# Patient Record
Sex: Female | Born: 1935 | Race: White | Hispanic: No | Marital: Married | Smoking: Never smoker
Health system: Southern US, Academic
[De-identification: ages and names within clinical notes are randomized; demographics above are authoritative.]

## PROBLEM LIST (undated history)

## (undated) ENCOUNTER — Emergency Department (HOSPITAL_COMMUNITY): Payer: Medicare Other

## (undated) DIAGNOSIS — N189 Chronic kidney disease, unspecified: Secondary | ICD-10-CM

## (undated) DIAGNOSIS — K635 Polyp of colon: Secondary | ICD-10-CM

## (undated) DIAGNOSIS — E119 Type 2 diabetes mellitus without complications: Secondary | ICD-10-CM

## (undated) DIAGNOSIS — M539 Dorsopathy, unspecified: Secondary | ICD-10-CM

## (undated) DIAGNOSIS — M6281 Muscle weakness (generalized): Secondary | ICD-10-CM

## (undated) DIAGNOSIS — Z973 Presence of spectacles and contact lenses: Secondary | ICD-10-CM

## (undated) DIAGNOSIS — H9190 Unspecified hearing loss, unspecified ear: Secondary | ICD-10-CM

## (undated) DIAGNOSIS — Z8669 Personal history of other diseases of the nervous system and sense organs: Secondary | ICD-10-CM

## (undated) DIAGNOSIS — E7800 Pure hypercholesterolemia, unspecified: Secondary | ICD-10-CM

## (undated) DIAGNOSIS — I10 Essential (primary) hypertension: Secondary | ICD-10-CM

## (undated) DIAGNOSIS — M1990 Unspecified osteoarthritis, unspecified site: Secondary | ICD-10-CM

## (undated) DIAGNOSIS — J45909 Unspecified asthma, uncomplicated: Secondary | ICD-10-CM

## (undated) DIAGNOSIS — Z950 Presence of cardiac pacemaker: Secondary | ICD-10-CM

## (undated) DIAGNOSIS — Z955 Presence of coronary angioplasty implant and graft: Secondary | ICD-10-CM

## (undated) DIAGNOSIS — K219 Gastro-esophageal reflux disease without esophagitis: Secondary | ICD-10-CM

## (undated) DIAGNOSIS — K922 Gastrointestinal hemorrhage, unspecified: Secondary | ICD-10-CM

## (undated) DIAGNOSIS — G629 Polyneuropathy, unspecified: Secondary | ICD-10-CM

## (undated) DIAGNOSIS — K5732 Diverticulitis of large intestine without perforation or abscess without bleeding: Secondary | ICD-10-CM

## (undated) HISTORY — PX: KNEE ARTHROPLASTY: SHX992

## (undated) HISTORY — PX: HX GALL BLADDER SURGERY/CHOLE: SHX55

## (undated) HISTORY — PX: ESOPHAGOGASTRODUODENOSCOPY: SHX1529

## (undated) HISTORY — PX: ORIF DISTAL FEMUR FRACTURE: SUR926

## (undated) HISTORY — PX: HX HIP REPLACEMENT: SHX124

## (undated) HISTORY — PX: ABDOMINAL SURGERY: SHX537

## (undated) HISTORY — PX: CATARACT EXTRACTION: SUR2

## (undated) HISTORY — PX: TOTAL ABDOMINAL HYSTERECTOMY: SHX209

## (undated) HISTORY — DX: Type 2 diabetes mellitus without complications (CMS HCC): E11.9

## (undated) HISTORY — PX: OTHER SURGICAL HISTORY: SHX170

## (undated) HISTORY — DX: Gastro-esophageal reflux disease without esophagitis: K21.9

## (undated) HISTORY — PX: HX PACEMAKER INSERTION: 2100001161

## (undated) HISTORY — DX: Chronic kidney disease, unspecified: N18.9

## (undated) HISTORY — DX: Pure hypercholesterolemia, unspecified: E78.00

## (undated) HISTORY — PX: ANKLE SURGERY: SHX546

## (undated) HISTORY — DX: Gastrointestinal hemorrhage, unspecified: K92.2

## (undated) HISTORY — PX: HX BACK SURGERY: SHX140

## (undated) HISTORY — PX: HIP SURGERY: SHX245

## (undated) HISTORY — PX: HX COLONOSCOPY: 2100001147

## (undated) HISTORY — PX: CORONARY ARTERY ANGIOPLASTY: PR CATH30428

## (undated) HISTORY — PX: HX SHOULDER SURGERY: 2100001311

---

## 1960-08-13 HISTORY — PX: HX OPEN CHOLECYSTECTOMY: SHX57

## 1969-08-13 HISTORY — PX: PANCREATIC PSEUDOCYST DRAINAGE: SHX2158

## 1993-08-13 DIAGNOSIS — I639 Cerebral infarction, unspecified: Secondary | ICD-10-CM

## 1993-08-13 HISTORY — DX: Cerebral infarction, unspecified (CMS HCC): I63.9

## 2000-08-13 HISTORY — PX: HX NISSEN FUNDOPLICATION: 2100001141

## 2003-03-11 ENCOUNTER — Ambulatory Visit (HOSPITAL_COMMUNITY): Payer: Self-pay | Admitting: GENERAL SURGERY

## 2008-08-13 DIAGNOSIS — I2510 Atherosclerotic heart disease of native coronary artery without angina pectoris: Secondary | ICD-10-CM

## 2008-08-13 HISTORY — DX: Atherosclerotic heart disease of native coronary artery without angina pectoris: I25.10

## 2009-08-13 HISTORY — PX: CORONARY ARTERY ANGIOPLASTY: PR CATH30428

## 2012-03-13 IMAGING — CR DG KNEE COMPLETE 4+V*R*
4 series · 4 of 4 positions shown · non-contrast
Comparison: None.

CLINICAL DATA: Knee pain and swelling.

RIGHT KNEE - COMPLETE 4+ VIEW

[t knee ap right]
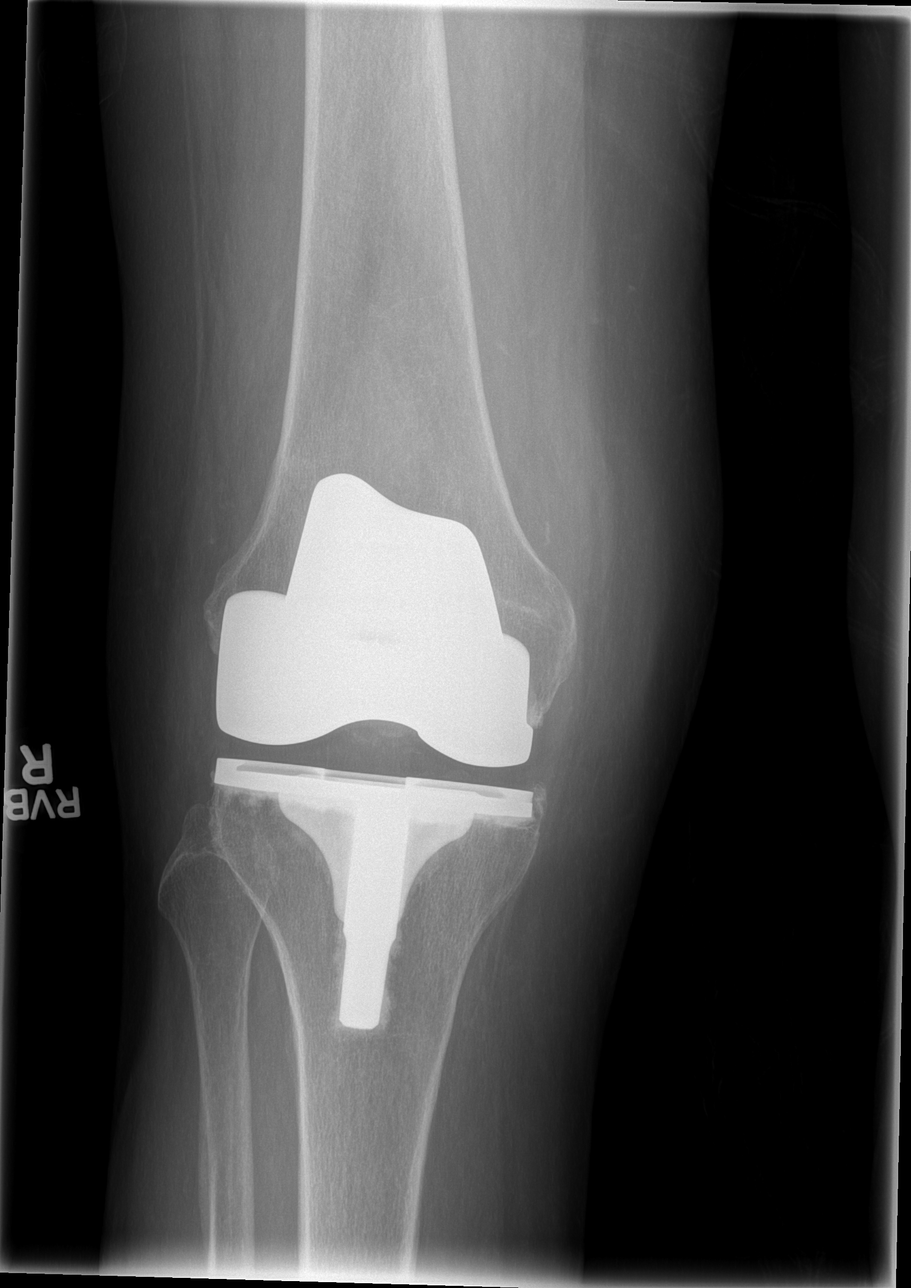

[t knee oblique right (1 of 2)]
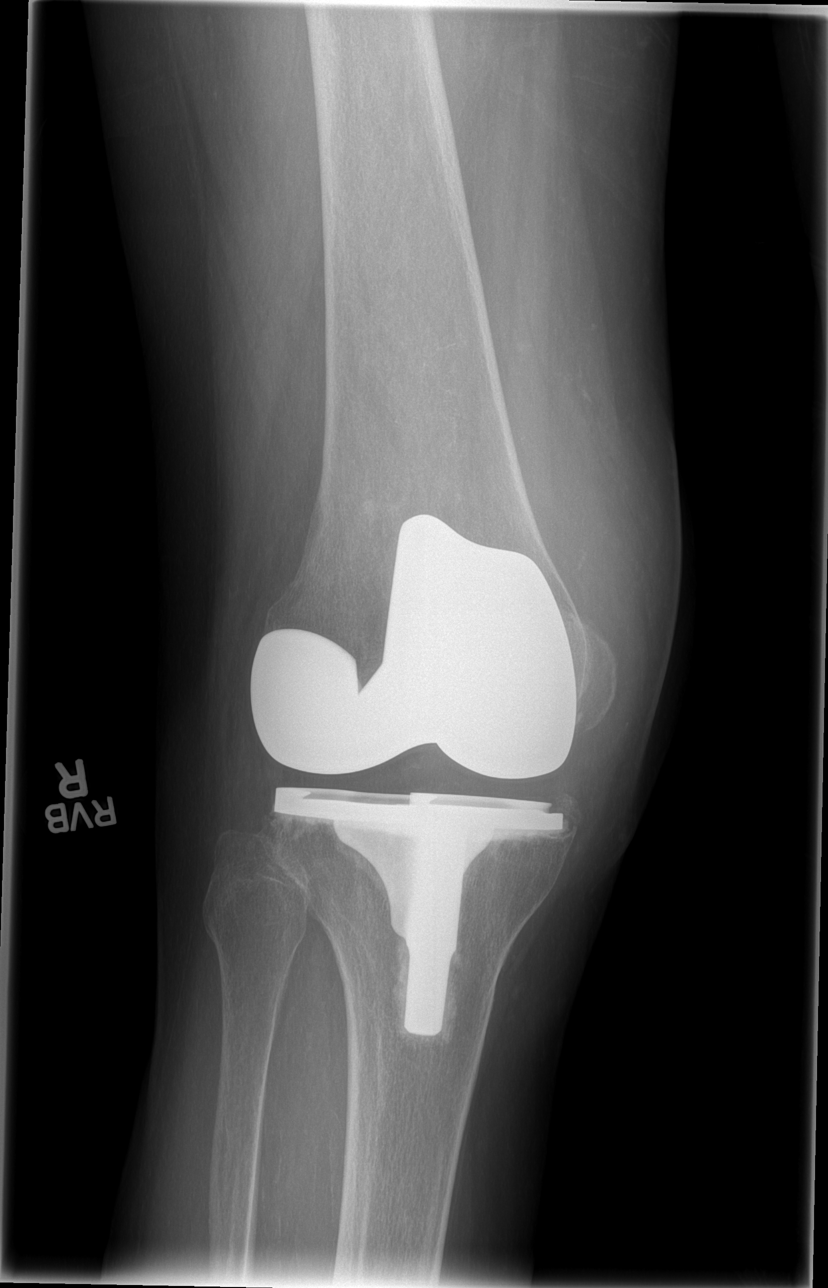

[t knee oblique right (2 of 2)]
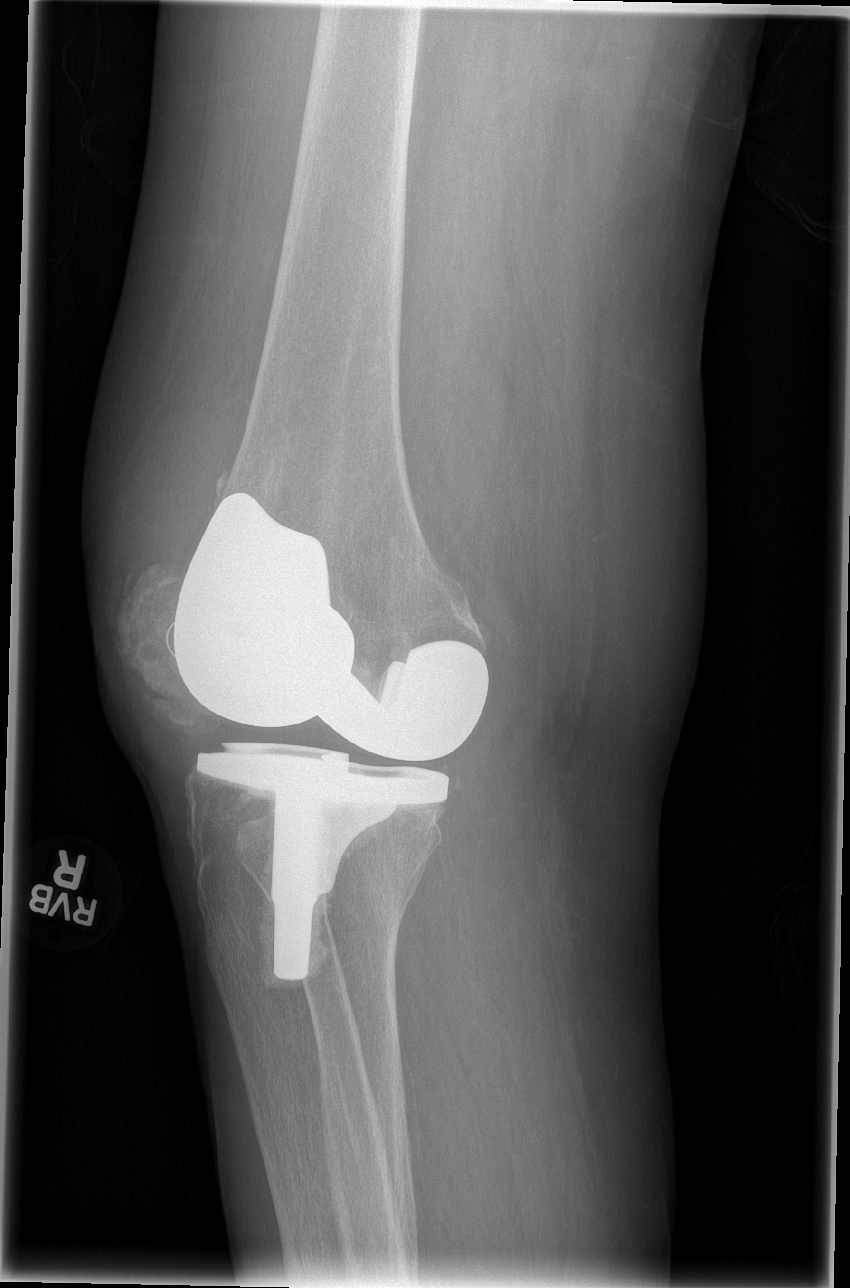

[t knee lat right]
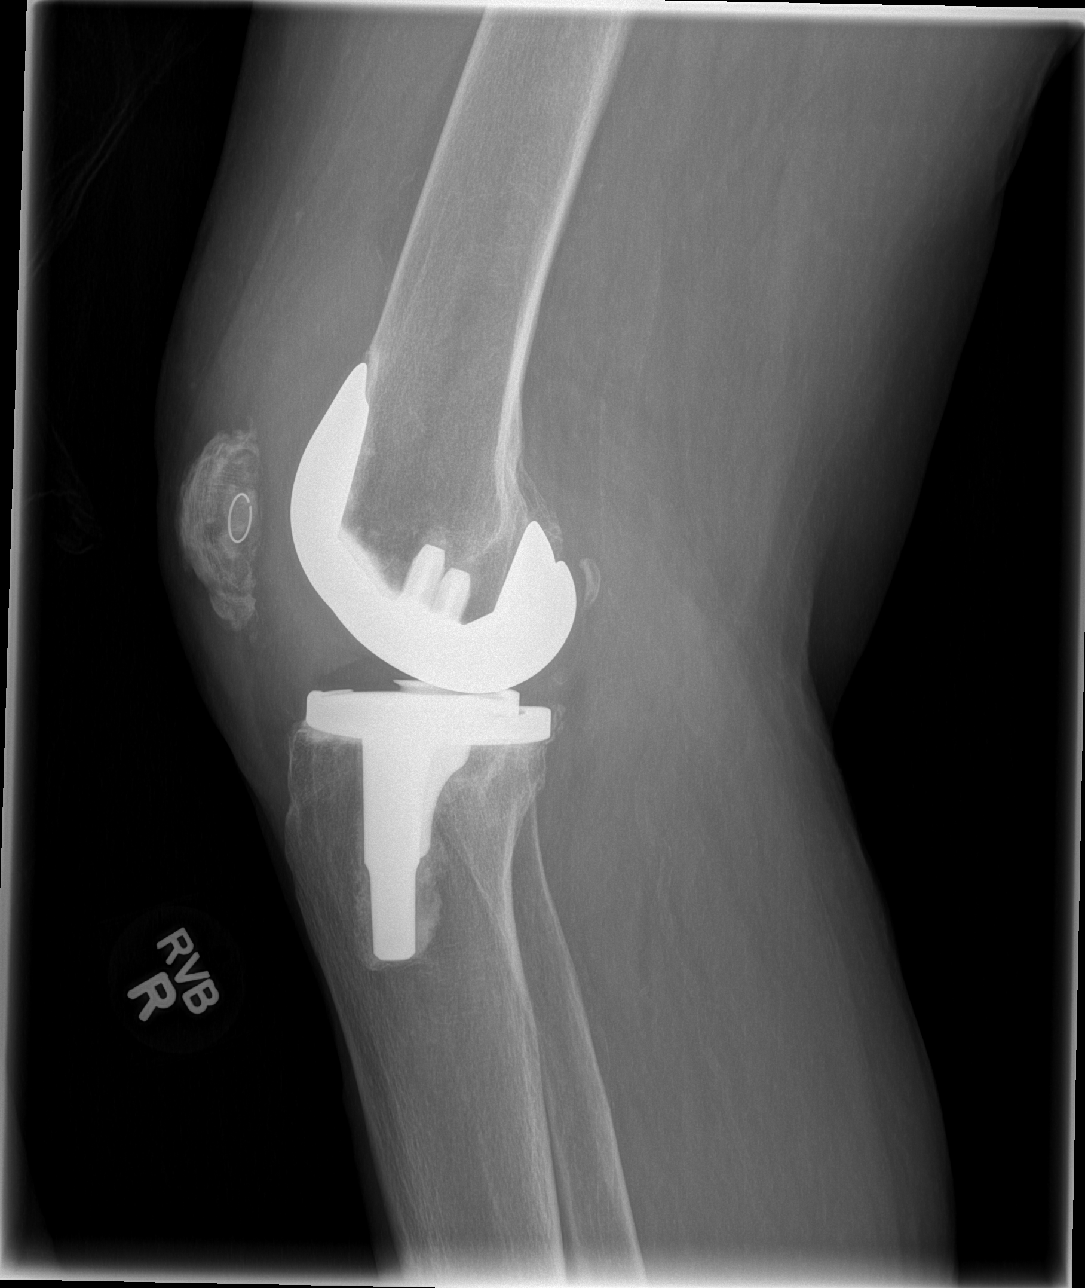

[4 of 4 positions shown; findings below may reference images not displayed]

FINDINGS: Total knee arthroplasty is seen with all three components
in expected position.  Large knee joint effusion is seen.  No
evidence of fracture or dislocation.  No evidence of prosthetic
loosening.  Generalized osteopenia noted.
IMPRESSION: 1.  Large knee joint effusion.
2.  No acute osseous abnormality.
3.  Osteopenia.

## 2014-05-19 DIAGNOSIS — D649 Anemia, unspecified: Secondary | ICD-10-CM | POA: Insufficient documentation

## 2014-08-06 DIAGNOSIS — I4891 Unspecified atrial fibrillation: Secondary | ICD-10-CM | POA: Insufficient documentation

## 2014-08-06 DIAGNOSIS — Z8673 Personal history of transient ischemic attack (TIA), and cerebral infarction without residual deficits: Secondary | ICD-10-CM

## 2014-08-06 DIAGNOSIS — M14672 Charcot's joint, left ankle and foot: Secondary | ICD-10-CM

## 2014-08-06 DIAGNOSIS — E119 Type 2 diabetes mellitus without complications: Secondary | ICD-10-CM | POA: Insufficient documentation

## 2014-08-06 DIAGNOSIS — M1990 Unspecified osteoarthritis, unspecified site: Secondary | ICD-10-CM | POA: Insufficient documentation

## 2014-08-06 DIAGNOSIS — I480 Paroxysmal atrial fibrillation: Secondary | ICD-10-CM | POA: Insufficient documentation

## 2014-08-06 DIAGNOSIS — Z9861 Coronary angioplasty status: Secondary | ICD-10-CM

## 2014-08-06 DIAGNOSIS — I2510 Atherosclerotic heart disease of native coronary artery without angina pectoris: Secondary | ICD-10-CM

## 2014-08-06 DIAGNOSIS — Z950 Presence of cardiac pacemaker: Secondary | ICD-10-CM

## 2014-08-06 DIAGNOSIS — K219 Gastro-esophageal reflux disease without esophagitis: Secondary | ICD-10-CM

## 2014-08-06 DIAGNOSIS — M069 Rheumatoid arthritis, unspecified: Secondary | ICD-10-CM

## 2014-08-06 DIAGNOSIS — K279 Peptic ulcer, site unspecified, unspecified as acute or chronic, without hemorrhage or perforation: Secondary | ICD-10-CM

## 2014-08-06 HISTORY — DX: Personal history of transient ischemic attack (TIA), and cerebral infarction without residual deficits: Z86.73

## 2014-08-06 HISTORY — DX: Type 2 diabetes mellitus without complications (CMS HCC): E11.9

## 2014-08-06 HISTORY — DX: Peptic ulcer, site unspecified, unspecified as acute or chronic, without hemorrhage or perforation: K27.9

## 2014-08-06 HISTORY — DX: Gastro-esophageal reflux disease without esophagitis: K21.9

## 2014-08-06 HISTORY — DX: Paroxysmal atrial fibrillation (CMS HCC): I48.0

## 2014-08-06 HISTORY — DX: Charcot's joint, left ankle and foot: M14.672

## 2014-08-06 HISTORY — DX: Coronary angioplasty status: Z98.61

## 2014-08-06 HISTORY — DX: Presence of cardiac pacemaker: Z95.0

## 2014-08-06 HISTORY — DX: Atherosclerotic heart disease of native coronary artery without angina pectoris: I25.10

## 2014-08-06 HISTORY — DX: Rheumatoid arthritis, unspecified (CMS HCC): M06.9

## 2015-01-13 DIAGNOSIS — I10 Essential (primary) hypertension: Secondary | ICD-10-CM | POA: Insufficient documentation

## 2017-01-10 DIAGNOSIS — Z950 Presence of cardiac pacemaker: Secondary | ICD-10-CM | POA: Insufficient documentation

## 2017-08-13 HISTORY — PX: HX PACEMAKER INSERTION: 2100001161

## 2018-09-04 ENCOUNTER — Emergency Department (HOSPITAL_COMMUNITY): Payer: Self-pay | Admitting: Emergency Medicine

## 2019-02-23 ENCOUNTER — Other Ambulatory Visit: Payer: Medicare Other | Attending: Internal Medicine | Admitting: Internal Medicine

## 2019-02-23 ENCOUNTER — Other Ambulatory Visit: Payer: Self-pay

## 2019-02-23 ENCOUNTER — Other Ambulatory Visit (HOSPITAL_COMMUNITY): Payer: Medicare Other

## 2019-02-23 DIAGNOSIS — E785 Hyperlipidemia, unspecified: Secondary | ICD-10-CM

## 2019-02-23 DIAGNOSIS — E119 Type 2 diabetes mellitus without complications: Secondary | ICD-10-CM

## 2019-02-23 LAB — HEPATIC FUNCTION PANEL
ALBUMIN/GLOBULIN RATIO: 1.6 — ABNORMAL LOW (ref 2.5–4.0)
ALBUMIN: 4.7 g/dL (ref 3.5–5.2)
ALKALINE PHOSPHATASE: 140 U/L — ABNORMAL HIGH (ref 35–104)
ALT (SGPT): 11 U/L (ref 0–33)
AST (SGOT): 25 U/L (ref 0–32)
BILIRUBIN DIRECT: 0 mg/dL (ref 0.0–0.2)
BILIRUBIN TOTAL: 0.3 mg/dL (ref 0.0–1.0)
PROTEIN TOTAL: 7.6 g/dL (ref 6.6–8.7)

## 2019-02-23 LAB — LIPID PANEL
CHOLESTEROL: 204 mg/dL — ABNORMAL HIGH (ref 100–170)
HDL CHOL: 84 mg/dL — ABNORMAL HIGH (ref 30–65)
LDL CALC: 86 mg/dL (ref 0–130)
TRIGLYCERIDES: 168 mg/dL (ref 0–200)
VLDL CALC: 34 mg/dL (ref 5–40)

## 2019-02-23 LAB — HGA1C (HEMOGLOBIN A1C WITH EST AVG GLUCOSE)
ESTIMATED AVERAGE GLUCOSE: 120 mg/dL
HEMOGLOBIN A1C: 5.8 % (ref 4.8–5.9)

## 2019-05-03 ENCOUNTER — Other Ambulatory Visit: Payer: Self-pay

## 2019-05-03 ENCOUNTER — Emergency Department: Payer: Medicare Other | Attending: NURSE PRACTITIONER

## 2019-05-03 DIAGNOSIS — R809 Proteinuria, unspecified: Secondary | ICD-10-CM | POA: Insufficient documentation

## 2019-05-03 DIAGNOSIS — E785 Hyperlipidemia, unspecified: Secondary | ICD-10-CM | POA: Insufficient documentation

## 2019-05-03 DIAGNOSIS — N181 Chronic kidney disease, stage 1: Secondary | ICD-10-CM

## 2019-05-03 DIAGNOSIS — R1310 Dysphagia, unspecified: Secondary | ICD-10-CM | POA: Insufficient documentation

## 2019-05-03 DIAGNOSIS — N183 Chronic kidney disease, stage 3 unspecified (CMS HCC): Secondary | ICD-10-CM

## 2019-05-03 DIAGNOSIS — I2510 Atherosclerotic heart disease of native coronary artery without angina pectoris: Secondary | ICD-10-CM

## 2019-05-03 DIAGNOSIS — I1310 Hypertensive heart and chronic kidney disease without heart failure, with stage 1 through stage 4 chronic kidney disease, or unspecified chronic kidney disease: Secondary | ICD-10-CM | POA: Insufficient documentation

## 2019-05-03 DIAGNOSIS — D649 Anemia, unspecified: Secondary | ICD-10-CM | POA: Insufficient documentation

## 2019-05-03 DIAGNOSIS — E559 Vitamin D deficiency, unspecified: Secondary | ICD-10-CM | POA: Insufficient documentation

## 2019-05-03 LAB — RENAL FUNCTION PANEL
ALBUMIN: 4.5 g/dL (ref 3.5–5.2)
ANION GAP: 17 mmol/L (ref 10–21)
BUN/CREA RATIO: 24
BUN: 31 mg/dL — ABNORMAL HIGH (ref 6–20)
CALCIUM: 10.3 mg/dL (ref 8.4–10.5)
CHLORIDE: 102 mmol/L (ref 96–107)
CO2 TOTAL: 19 mmol/L — ABNORMAL LOW (ref 22–29)
CREATININE: 1.29 mg/dL — ABNORMAL HIGH (ref 0.50–0.90)
ESTIMATED GFR: 39 mL/min/{1.73_m2} — ABNORMAL LOW (ref >60)
GLUCOSE: 114 mg/dL — ABNORMAL HIGH (ref 74–106)
PHOSPHORUS: 4.2 mg/dL (ref 2.5–4.5)
POTASSIUM: 3.8 mmol/L (ref 3.5–5.1)
SODIUM: 138 mmol/L (ref 136–145)

## 2019-05-03 LAB — CBC
HCT: 41.8 % (ref 34.8–46.0)
HGB: 13.9 g/dL (ref 11.5–16.0)
MCH: 30.5 pg (ref 26.0–32.0)
MCHC: 33.3 g/dL (ref 31.0–35.5)
MCV: 91.7 fL (ref 78.0–100.0)
MPV: 10.3 fL (ref 8.7–12.5)
PLATELETS: 469 10*3/uL — ABNORMAL HIGH (ref 150–400)
RBC: 4.56 10*6/uL (ref 3.85–5.22)
RDW-CV: 14.7 % (ref 11.5–15.5)
WBC: 6.6 10*3/uL (ref 3.7–11.0)

## 2019-08-24 ENCOUNTER — Other Ambulatory Visit (HOSPITAL_COMMUNITY): Payer: Self-pay | Admitting: NURSE PRACTITIONER

## 2019-08-24 ENCOUNTER — Ambulatory Visit (HOSPITAL_COMMUNITY)
Admission: RE | Admit: 2019-08-24 | Discharge: 2019-08-24 | Disposition: A | Discharge status: Home / Self Care | Payer: Medicare Other | Source: Ambulatory Visit | Attending: NURSE PRACTITIONER | Admitting: NURSE PRACTITIONER

## 2019-08-24 ENCOUNTER — Ambulatory Visit
Admission: RE | Admit: 2019-08-24 | Discharge: 2019-08-24 | Disposition: A | Discharge status: Home / Self Care | Payer: Medicare Other | Source: Ambulatory Visit | Attending: NURSE PRACTITIONER | Admitting: NURSE PRACTITIONER

## 2019-08-24 ENCOUNTER — Other Ambulatory Visit: Payer: Self-pay

## 2019-08-24 ENCOUNTER — Ambulatory Visit (HOSPITAL_COMMUNITY): Payer: Medicare Other

## 2019-08-24 DIAGNOSIS — I480 Paroxysmal atrial fibrillation: Secondary | ICD-10-CM | POA: Insufficient documentation

## 2019-08-24 DIAGNOSIS — R0600 Dyspnea, unspecified: Secondary | ICD-10-CM

## 2019-08-24 LAB — COMPREHENSIVE METABOLIC PANEL, NON-FASTING
ALBUMIN/GLOBULIN RATIO: 1.7 — ABNORMAL LOW (ref 2.5–4.0)
ALBUMIN: 4.6 g/dL (ref 3.5–5.2)
ALKALINE PHOSPHATASE: 136 U/L — ABNORMAL HIGH (ref 35–104)
ALT (SGPT): 12 U/L (ref 0–33)
ANION GAP: 11 mmol/L (ref 10–21)
AST (SGOT): 29 U/L (ref 0–32)
BILIRUBIN TOTAL: 0.2 mg/dL (ref 0.0–1.0)
BUN/CREA RATIO: 17
BUN: 23 mg/dL — ABNORMAL HIGH (ref 6–20)
CALCIUM: 9.2 mg/dL (ref 8.4–10.5)
CHLORIDE: 104 mmol/L (ref 96–107)
CO2 TOTAL: 21 mmol/L — ABNORMAL LOW (ref 22–29)
CREATININE: 1.39 mg/dL — ABNORMAL HIGH (ref 0.50–0.90)
ESTIMATED GFR: 35 mL/min/{1.73_m2} — ABNORMAL LOW (ref >60)
GLUCOSE: 216 mg/dL — ABNORMAL HIGH (ref 74–106)
POTASSIUM: 4.1 mmol/L (ref 3.5–5.1)
PROTEIN TOTAL: 7.3 g/dL (ref 6.6–8.7)
SODIUM: 136 mmol/L (ref 136–145)

## 2019-08-24 LAB — THYROXINE, FREE (FREE T4): THYROXINE (T4), FREE: 1.17 ng/dL (ref 0.93–1.70)

## 2019-08-24 LAB — THYROID STIMULATING HORMONE (SENSITIVE TSH): TSH: 2.62 u[IU]/mL (ref 0.270–4.200)

## 2019-08-24 LAB — MAGNESIUM: MAGNESIUM: 2.2 mg/dL (ref 1.6–2.6)

## 2019-08-24 MED ORDER — ALBUTEROL SULFATE 2.5 MG/3 ML (0.083 %) SOLUTION FOR NEBULIZATION
2.5000 mg | INHALATION_SOLUTION | Freq: Once | RESPIRATORY_TRACT | Status: AC
Start: 2019-08-24 — End: 2019-08-24
  Administered 2019-08-24: 2.5 mg via RESPIRATORY_TRACT
  Filled 2019-08-24: qty 3

## 2019-08-25 LAB — ECG 12 LEAD
Atrial Rate: 71 {beats}/min
Calculated P Axis: 96 degrees
Calculated R Axis: 85 degrees
Calculated T Axis: 43 degrees
PR Interval: 266 ms
QRS Duration: 104 ms
QT Interval: 430 ms
QTC Calculation: 467 ms
Ventricular rate: 71 {beats}/min

## 2019-11-23 ENCOUNTER — Ambulatory Visit: Payer: Medicare Other | Attending: Internal Medicine | Admitting: Internal Medicine

## 2019-11-23 ENCOUNTER — Other Ambulatory Visit (HOSPITAL_COMMUNITY): Payer: Medicare Other

## 2019-11-23 ENCOUNTER — Other Ambulatory Visit: Payer: Self-pay

## 2019-11-23 DIAGNOSIS — Z79899 Other long term (current) drug therapy: Secondary | ICD-10-CM | POA: Insufficient documentation

## 2019-11-23 DIAGNOSIS — I10 Essential (primary) hypertension: Secondary | ICD-10-CM | POA: Insufficient documentation

## 2019-11-23 DIAGNOSIS — D649 Anemia, unspecified: Secondary | ICD-10-CM | POA: Insufficient documentation

## 2019-11-23 DIAGNOSIS — R531 Weakness: Secondary | ICD-10-CM | POA: Insufficient documentation

## 2019-11-23 LAB — COMPREHENSIVE METABOLIC PANEL, NON-FASTING
ALBUMIN/GLOBULIN RATIO: 1.8 — ABNORMAL LOW (ref 2.5–4.0)
ALBUMIN: 4.6 g/dL (ref 3.5–5.2)
ALKALINE PHOSPHATASE: 109 U/L — ABNORMAL HIGH (ref 35–104)
ALT (SGPT): 21 U/L (ref 0–33)
ANION GAP: 15 mmol/L (ref 10–21)
AST (SGOT): 35 U/L — ABNORMAL HIGH (ref 0–32)
BILIRUBIN TOTAL: 0.4 mg/dL (ref 0.0–1.0)
BUN/CREA RATIO: 18
BUN: 24 mg/dL — ABNORMAL HIGH (ref 6–20)
CALCIUM: 10.1 mg/dL (ref 8.4–10.5)
CHLORIDE: 106 mmol/L (ref 96–107)
CO2 TOTAL: 19 mmol/L — ABNORMAL LOW (ref 22–29)
CREATININE: 1.3 mg/dL — ABNORMAL HIGH (ref 0.50–0.90)
ESTIMATED GFR: 38 mL/min/{1.73_m2} — ABNORMAL LOW (ref >60)
GLUCOSE: 87 mg/dL (ref 74–106)
POTASSIUM: 4 mmol/L (ref 3.5–5.1)
PROTEIN TOTAL: 7.2 g/dL (ref 6.6–8.7)
SODIUM: 140 mmol/L (ref 136–145)

## 2019-11-23 LAB — THYROXINE, FREE (FREE T4): THYROXINE (T4), FREE: 1.35 ng/dL (ref 0.93–1.70)

## 2019-11-23 LAB — CBC WITH DIFF
BASOPHIL #: <0.1 10*3/uL (ref <=0.20)
BASOPHIL %: 0 %
EOSINOPHIL #: <0.1 10*3/uL (ref <=0.50)
EOSINOPHIL %: 1 %
HCT: 42.3 % (ref 34.8–46.0)
HGB: 13.7 g/dL (ref 11.5–16.0)
IMMATURE GRANULOCYTE #: <0.1 10*3/uL (ref <0.10)
IMMATURE GRANULOCYTE %: 1 % (ref 0–1)
LYMPHOCYTE #: 1.61 10*3/uL (ref 1.00–4.80)
LYMPHOCYTE %: 19 %
MCH: 31.2 pg (ref 26.0–32.0)
MCHC: 32.4 g/dL (ref 31.0–35.5)
MCV: 96.4 fL (ref 78.0–100.0)
MONOCYTE #: 0.96 10*3/uL (ref 0.20–1.10)
MONOCYTE %: 11 %
MPV: 11.3 fL (ref 8.7–12.5)
NEUTROPHIL #: 5.99 10*3/uL (ref 1.50–7.70)
NEUTROPHIL %: 68 %
PLATELETS: 338 10*3/uL (ref 150–400)
RBC: 4.39 10*6/uL (ref 3.85–5.22)
RDW-CV: 15.2 % (ref 11.5–15.5)
WBC: 8.7 10*3/uL (ref 3.7–11.0)

## 2019-11-23 LAB — HGA1C (HEMOGLOBIN A1C WITH EST AVG GLUCOSE)
ESTIMATED AVERAGE GLUCOSE: 108 mg/dL
HEMOGLOBIN A1C: 5.4 % (ref 4.8–5.9)

## 2019-11-23 LAB — THYROID STIMULATING HORMONE (SENSITIVE TSH): TSH: 2.34 u[IU]/mL (ref 0.270–4.200)

## 2019-11-23 LAB — FERRITIN: FERRITIN: 118 ng/mL (ref 13–150)

## 2019-12-04 ENCOUNTER — Other Ambulatory Visit: Payer: Self-pay

## 2019-12-04 ENCOUNTER — Ambulatory Visit: Payer: Medicare Other | Attending: NURSE PRACTITIONER

## 2019-12-04 DIAGNOSIS — R197 Diarrhea, unspecified: Secondary | ICD-10-CM | POA: Insufficient documentation

## 2019-12-04 LAB — CLOSTRIDIUM DIFFICILE TOXIN A/B
CLOSTRIDIUM DIFFICILE TOXIN A/B: NEGATIVE
LOT NUMBER: 1120207

## 2019-12-06 ENCOUNTER — Other Ambulatory Visit: Payer: Self-pay

## 2019-12-06 ENCOUNTER — Ambulatory Visit: Payer: Medicare Other | Attending: NURSE PRACTITIONER

## 2019-12-06 DIAGNOSIS — I1310 Hypertensive heart and chronic kidney disease without heart failure, with stage 1 through stage 4 chronic kidney disease, or unspecified chronic kidney disease: Secondary | ICD-10-CM | POA: Insufficient documentation

## 2019-12-06 DIAGNOSIS — E785 Hyperlipidemia, unspecified: Secondary | ICD-10-CM | POA: Insufficient documentation

## 2019-12-06 DIAGNOSIS — R809 Proteinuria, unspecified: Secondary | ICD-10-CM

## 2019-12-06 DIAGNOSIS — I2510 Atherosclerotic heart disease of native coronary artery without angina pectoris: Secondary | ICD-10-CM | POA: Insufficient documentation

## 2019-12-06 DIAGNOSIS — D649 Anemia, unspecified: Secondary | ICD-10-CM | POA: Insufficient documentation

## 2019-12-06 DIAGNOSIS — N183 Chronic kidney disease, stage 3 unspecified: Secondary | ICD-10-CM | POA: Insufficient documentation

## 2019-12-06 DIAGNOSIS — E559 Vitamin D deficiency, unspecified: Secondary | ICD-10-CM | POA: Insufficient documentation

## 2019-12-06 DIAGNOSIS — R1310 Dysphagia, unspecified: Secondary | ICD-10-CM | POA: Insufficient documentation

## 2019-12-06 LAB — VITAMIN D 25 TOTAL: VITAMIN D 25, TOTAL: 40.86 ng/mL (ref 30.00–100.00)

## 2019-12-06 LAB — CBC
HCT: 38.6 % (ref 34.8–46.0)
HGB: 12.4 g/dL (ref 11.5–16.0)
MCH: 31.6 pg (ref 26.0–32.0)
MCHC: 32.1 g/dL (ref 31.0–35.5)
MCV: 98.5 fL (ref 78.0–100.0)
MPV: 9.9 fL (ref 8.7–12.5)
PLATELETS: 452 10*3/uL — ABNORMAL HIGH (ref 150–400)
RBC: 3.92 10*6/uL (ref 3.85–5.22)
RDW-CV: 15 % (ref 11.5–15.5)
WBC: 6.9 10*3/uL (ref 3.7–11.0)

## 2019-12-06 LAB — RENAL FUNCTION PANEL
ALBUMIN: 4.2 g/dL (ref 3.5–5.2)
ANION GAP: 17 mmol/L (ref 10–21)
BUN/CREA RATIO: 22
BUN: 31 mg/dL — ABNORMAL HIGH (ref 6–20)
CALCIUM: 9.5 mg/dL (ref 8.4–10.5)
CHLORIDE: 104 mmol/L (ref 96–107)
CO2 TOTAL: 15 mmol/L — ABNORMAL LOW (ref 22–29)
CREATININE: 1.43 mg/dL — ABNORMAL HIGH (ref 0.50–0.90)
ESTIMATED GFR: 34 mL/min/{1.73_m2} — ABNORMAL LOW (ref >60)
GLUCOSE: 70 mg/dL — ABNORMAL LOW (ref 74–106)
PHOSPHORUS: 3.1 mg/dL (ref 2.5–4.5)
POTASSIUM: 4.1 mmol/L (ref 3.5–5.1)
SODIUM: 136 mmol/L (ref 136–145)

## 2019-12-07 LAB — PARATHYROID HORMONE (PTH): PTH: 79.9 pg/mL — ABNORMAL HIGH (ref 8.5–77.0)

## 2019-12-08 LAB — E. COLI SHIGA TOXIN
SHIGA TOXIN 1: NEGATIVE
SHIGA TOXIN 2: NEGATIVE

## 2019-12-08 LAB — CALPROTECTIN, FECES: CALPROTECTIN, FECES: 145.6 mcg/g — ABNORMAL HIGH

## 2019-12-09 LAB — ROUTINE STOOL CULTURE (INCLUDING E. COLI SHIGA TOXIN)

## 2019-12-09 LAB — PANCREATIC ELASTASE, FECES: PANCREATIC ELASTASE, F: 138 mcg/g — ABNORMAL LOW

## 2019-12-25 DIAGNOSIS — N183 Chronic kidney disease, stage 3 unspecified (CMS HCC): Secondary | ICD-10-CM

## 2019-12-25 DIAGNOSIS — N1832 Chronic kidney disease, stage 3b (CMS HCC): Secondary | ICD-10-CM | POA: Insufficient documentation

## 2019-12-25 DIAGNOSIS — E785 Hyperlipidemia, unspecified: Secondary | ICD-10-CM

## 2019-12-25 DIAGNOSIS — D649 Anemia, unspecified: Secondary | ICD-10-CM | POA: Insufficient documentation

## 2019-12-25 DIAGNOSIS — E559 Vitamin D deficiency, unspecified: Secondary | ICD-10-CM

## 2019-12-25 HISTORY — DX: Vitamin D deficiency, unspecified: E55.9

## 2019-12-25 HISTORY — DX: Hyperlipidemia, unspecified: E78.5

## 2019-12-25 HISTORY — DX: Chronic kidney disease, stage 3 unspecified (CMS HCC): N18.30

## 2020-01-08 ENCOUNTER — Other Ambulatory Visit: Payer: Self-pay

## 2020-01-08 ENCOUNTER — Ambulatory Visit
Admission: RE | Admit: 2020-01-08 | Discharge: 2020-01-08 | Disposition: A | Discharge status: Home / Self Care | Payer: Medicare Other | Source: Ambulatory Visit | Attending: Registered" | Admitting: Registered"

## 2020-01-08 NOTE — Progress Notes (Signed)
Nutrition education    Met with patient and daughter [name], reviewed history and typical meal pattern  Reports  11# wt loss in past 2 months  Low poor appetite  Likes sweets  Not a big eater  Recent med changes/additions    Reviewed food preferences  Provided ideas/materials of increasing protein and calories in meals, provided snack ideas, recipes, sample menu, and gave supplements to try  Seemed to understand, asked questions particiapted in discussion    Plan  Try to eat food from all food groups daily  Keep a food diary  Try adding protein powder/powdered milk to food/beverages  Try supplemnets   Potentially need medication for appetite    Follow up prn

## 2020-01-19 ENCOUNTER — Other Ambulatory Visit (HOSPITAL_COMMUNITY): Payer: Self-pay | Admitting: INTERNAL MEDICINE

## 2020-01-19 DIAGNOSIS — Z79899 Other long term (current) drug therapy: Secondary | ICD-10-CM

## 2020-01-19 DIAGNOSIS — I482 Chronic atrial fibrillation, unspecified: Secondary | ICD-10-CM

## 2020-01-20 ENCOUNTER — Other Ambulatory Visit (HOSPITAL_COMMUNITY): Payer: Self-pay | Admitting: INTERNAL MEDICINE

## 2020-01-20 ENCOUNTER — Encounter (HOSPITAL_COMMUNITY): Payer: Self-pay

## 2020-01-20 ENCOUNTER — Other Ambulatory Visit: Payer: Self-pay

## 2020-01-20 ENCOUNTER — Ambulatory Visit (HOSPITAL_BASED_OUTPATIENT_CLINIC_OR_DEPARTMENT_OTHER)
Admission: RE | Admit: 2020-01-20 | Discharge: 2020-01-20 | Disposition: A | Discharge status: Home / Self Care | Payer: Medicare Other | Source: Ambulatory Visit | Attending: INTERNAL MEDICINE | Admitting: INTERNAL MEDICINE

## 2020-01-20 ENCOUNTER — Ambulatory Visit
Admission: RE | Admit: 2020-01-20 | Discharge: 2020-01-20 | Disposition: A | Discharge status: Home / Self Care | Payer: Medicare Other | Source: Ambulatory Visit | Attending: Nurse Practitioner | Admitting: Nurse Practitioner

## 2020-01-20 DIAGNOSIS — Z79899 Other long term (current) drug therapy: Secondary | ICD-10-CM | POA: Insufficient documentation

## 2020-01-20 DIAGNOSIS — I4891 Unspecified atrial fibrillation: Secondary | ICD-10-CM | POA: Insufficient documentation

## 2020-01-20 DIAGNOSIS — I482 Chronic atrial fibrillation, unspecified: Secondary | ICD-10-CM

## 2020-01-20 DIAGNOSIS — J984 Other disorders of lung: Secondary | ICD-10-CM | POA: Insufficient documentation

## 2020-01-20 MED ORDER — ALBUTEROL SULFATE 2.5 MG/3 ML (0.083 %) SOLUTION FOR NEBULIZATION
2.5000 mg | INHALATION_SOLUTION | Freq: Once | RESPIRATORY_TRACT | Status: AC
Start: 2020-01-20 — End: 2020-01-20
  Administered 2020-01-20: 2.5 mg via RESPIRATORY_TRACT
  Filled 2020-01-20: qty 3

## 2020-02-10 ENCOUNTER — Ambulatory Visit (INDEPENDENT_AMBULATORY_CARE_PROVIDER_SITE_OTHER): Payer: Self-pay | Admitting: Internal Medicine

## 2020-04-04 ENCOUNTER — Emergency Department
Admission: EM | Admit: 2020-04-04 | Discharge: 2020-04-04 | Disposition: A | Discharge status: Home / Self Care | Payer: Medicare Other | Attending: Emergency Medicine | Admitting: Emergency Medicine

## 2020-04-04 ENCOUNTER — Other Ambulatory Visit: Payer: Self-pay

## 2020-04-04 ENCOUNTER — Encounter (HOSPITAL_COMMUNITY): Payer: Self-pay

## 2020-04-04 DIAGNOSIS — T24111A Burn of first degree of right thigh, initial encounter: Secondary | ICD-10-CM | POA: Insufficient documentation

## 2020-04-04 DIAGNOSIS — T24102A Burn of first degree of unspecified site of left lower limb, except ankle and foot, initial encounter: Secondary | ICD-10-CM

## 2020-04-04 DIAGNOSIS — X12XXXA Contact with other hot fluids, initial encounter: Secondary | ICD-10-CM | POA: Insufficient documentation

## 2020-04-04 DIAGNOSIS — T2119XA Burn of first degree of other site of trunk, initial encounter: Secondary | ICD-10-CM | POA: Insufficient documentation

## 2020-04-04 DIAGNOSIS — T25122A Burn of first degree of left foot, initial encounter: Secondary | ICD-10-CM | POA: Insufficient documentation

## 2020-04-04 DIAGNOSIS — T24112A Burn of first degree of left thigh, initial encounter: Secondary | ICD-10-CM | POA: Insufficient documentation

## 2020-04-04 HISTORY — DX: Essential (primary) hypertension: I10

## 2020-04-04 HISTORY — DX: Polyneuropathy, unspecified: G62.9

## 2020-04-04 MED ORDER — ONDANSETRON HCL (PF) 4 MG/2 ML INJECTION SOLUTION
4.00 mg | INTRAMUSCULAR | Status: DC
Start: 2020-04-04 — End: 2020-04-04

## 2020-04-04 MED ORDER — FENTANYL (PF) 50 MCG/ML INJECTION SOLUTION
50.00 ug | INTRAMUSCULAR | Status: AC
Start: 2020-04-04 — End: 2020-04-04
  Administered 2020-04-04: 50 ug via INTRAVENOUS
  Filled 2020-04-04: qty 2

## 2020-04-04 MED ORDER — ONDANSETRON 4 MG DISINTEGRATING TABLET
4.00 mg | ORAL_TABLET | Freq: Three times a day (TID) | ORAL | 0 refills | Status: DC | PRN
Start: 2020-04-04 — End: 2020-07-19

## 2020-04-04 MED ORDER — HYDROCODONE-ACETAMINOPHEN 5 MG-325 MG TABLET - ED TO GO MED
1.0000 | ORAL_TABLET | ORAL | Status: AC
Start: 2020-04-04 — End: 2020-04-04
  Administered 2020-04-04: 1 via ORAL
  Filled 2020-04-04: qty 2

## 2020-04-04 MED ORDER — BACITRACIN 500 UNIT/G OINTMENT TUBE
TOPICAL_OINTMENT | CUTANEOUS | Status: AC
Start: 2020-04-04 — End: 2020-04-04
  Filled 2020-04-04: qty 28

## 2020-04-04 MED ORDER — HYDROCODONE 5 MG-ACETAMINOPHEN 325 MG TABLET
1.00 | ORAL_TABLET | Freq: Four times a day (QID) | ORAL | 0 refills | Status: DC | PRN
Start: 2020-04-04 — End: 2020-05-12

## 2020-04-04 MED ORDER — MORPHINE 4 MG/ML INJECTION SYRINGE
4.00 mg | INJECTION | INTRAMUSCULAR | Status: DC
Start: 2020-04-04 — End: 2020-04-04

## 2020-04-04 MED ORDER — ONDANSETRON HCL (PF) 4 MG/2 ML INJECTION SOLUTION
INTRAMUSCULAR | Status: DC
Start: 2020-04-04 — End: 2020-04-04
  Filled 2020-04-04: qty 2
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
## 2020-04-04 NOTE — ED Nurses Note (Signed)
Pt resting in bed. Skin pwd. No needs at present. Pt d/c home with written instructions. All questions and concerns addressed before d/c
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
## 2020-04-04 NOTE — Discharge Instructions (Signed)
Keep the area covered until you see the doctor tomorrow for follow-up.  Pain medication as directed.

## 2020-04-05 ENCOUNTER — Encounter (INDEPENDENT_AMBULATORY_CARE_PROVIDER_SITE_OTHER): Payer: Self-pay | Admitting: Surgery

## 2020-04-05 ENCOUNTER — Ambulatory Visit: Payer: Medicare Other | Attending: Surgery | Admitting: Surgery

## 2020-04-05 VITALS — BP 106/63 | HR 79 | Temp 99.1°F | Resp 18 | Ht 66.0 in | Wt 123.0 lb

## 2020-04-05 DIAGNOSIS — T25022A Burn of unspecified degree of left foot, initial encounter: Secondary | ICD-10-CM | POA: Insufficient documentation

## 2020-04-05 DIAGNOSIS — T25222A Burn of second degree of left foot, initial encounter: Secondary | ICD-10-CM

## 2020-04-05 DIAGNOSIS — X118XXA Contact with other hot tap-water, initial encounter: Secondary | ICD-10-CM | POA: Insufficient documentation

## 2020-04-05 DIAGNOSIS — G629 Polyneuropathy, unspecified: Secondary | ICD-10-CM | POA: Insufficient documentation

## 2020-04-05 DIAGNOSIS — I10 Essential (primary) hypertension: Secondary | ICD-10-CM | POA: Insufficient documentation

## 2020-04-05 DIAGNOSIS — T24202A Burn of second degree of unspecified site of left lower limb, except ankle and foot, initial encounter: Secondary | ICD-10-CM | POA: Insufficient documentation
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
## 2020-04-05 NOTE — Nursing Note (Signed)
Travel Screening     Question   Response    In the last month, have you been in contact with someone who was confirmed or suspected to have Coronavirus / COVID-19?  No / Unsure    Have you had a COVID-19 viral test in the last 14 days?  No    Do you have any of the following new or worsening symptoms?  None of these    Have you traveled internationally or domestically in the last month?  No      Travel History   Travel since 03/05/20     No documented travel since 03/05/20

## 2020-04-06 ENCOUNTER — Ambulatory Visit: Payer: Medicare Other | Attending: Surgery | Admitting: Surgery

## 2020-04-06 ENCOUNTER — Other Ambulatory Visit: Payer: Self-pay

## 2020-04-06 ENCOUNTER — Encounter (INDEPENDENT_AMBULATORY_CARE_PROVIDER_SITE_OTHER): Payer: Self-pay | Admitting: Surgery

## 2020-04-06 VITALS — BP 142/78 | HR 88 | Temp 97.2°F | Resp 18 | Ht 66.0 in | Wt 123.0 lb

## 2020-04-06 DIAGNOSIS — T24202D Burn of second degree of unspecified site of left lower limb, except ankle and foot, subsequent encounter: Secondary | ICD-10-CM

## 2020-04-06 DIAGNOSIS — T25222D Burn of second degree of left foot, subsequent encounter: Secondary | ICD-10-CM | POA: Insufficient documentation

## 2020-04-06 DIAGNOSIS — T2122XD Burn of second degree of abdominal wall, subsequent encounter: Secondary | ICD-10-CM

## 2020-04-06 DIAGNOSIS — E1140 Type 2 diabetes mellitus with diabetic neuropathy, unspecified: Secondary | ICD-10-CM | POA: Insufficient documentation

## 2020-04-06 DIAGNOSIS — Z7984 Long term (current) use of oral hypoglycemic drugs: Secondary | ICD-10-CM | POA: Insufficient documentation

## 2020-04-06 NOTE — Nursing Note (Signed)
Travel Screening     Question   Response    In the last month, have you been in contact with someone who was confirmed or suspected to have Coronavirus / COVID-19?  No / Unsure    Have you had a COVID-19 viral test in the last 14 days?  No    Do you have any of the following new or worsening symptoms?  None of these    Have you traveled internationally or domestically in the last month?  No      Travel History   Travel since 03/06/20     No documented travel since 03/06/20
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
## 2020-04-08 ENCOUNTER — Ambulatory Visit: Payer: Medicare Other | Attending: Surgery | Admitting: Surgery

## 2020-04-08 ENCOUNTER — Other Ambulatory Visit: Payer: Self-pay

## 2020-04-08 ENCOUNTER — Encounter (INDEPENDENT_AMBULATORY_CARE_PROVIDER_SITE_OTHER): Payer: Self-pay | Admitting: Surgery

## 2020-04-08 VITALS — BP 131/78 | HR 69 | Temp 98.9°F | Resp 18 | Ht 66.0 in | Wt 123.0 lb

## 2020-04-08 DIAGNOSIS — T24202D Burn of second degree of unspecified site of left lower limb, except ankle and foot, subsequent encounter: Secondary | ICD-10-CM | POA: Insufficient documentation

## 2020-04-08 DIAGNOSIS — T25022D Burn of unspecified degree of left foot, subsequent encounter: Secondary | ICD-10-CM | POA: Insufficient documentation

## 2020-04-08 DIAGNOSIS — E1122 Type 2 diabetes mellitus with diabetic chronic kidney disease: Secondary | ICD-10-CM | POA: Insufficient documentation

## 2020-04-08 DIAGNOSIS — Z7984 Long term (current) use of oral hypoglycemic drugs: Secondary | ICD-10-CM | POA: Insufficient documentation

## 2020-04-08 DIAGNOSIS — I129 Hypertensive chronic kidney disease with stage 1 through stage 4 chronic kidney disease, or unspecified chronic kidney disease: Secondary | ICD-10-CM | POA: Insufficient documentation

## 2020-04-08 DIAGNOSIS — T2122XD Burn of second degree of abdominal wall, subsequent encounter: Secondary | ICD-10-CM | POA: Insufficient documentation

## 2020-04-08 DIAGNOSIS — Z79899 Other long term (current) drug therapy: Secondary | ICD-10-CM | POA: Insufficient documentation

## 2020-04-08 DIAGNOSIS — T25222D Burn of second degree of left foot, subsequent encounter: Secondary | ICD-10-CM

## 2020-04-08 DIAGNOSIS — N183 Chronic kidney disease, stage 3 unspecified: Secondary | ICD-10-CM | POA: Insufficient documentation

## 2020-04-08 NOTE — Nursing Note (Signed)
Travel Screening     Question   Response    In the last month, have you been in contact with someone who was confirmed or suspected to have Coronavirus / COVID-19?  No / Unsure    Have you had a COVID-19 viral test in the last 14 days?  No    Do you have any of the following new or worsening symptoms?  None of these    Have you traveled internationally or domestically in the last month?  No      Travel History   Travel since 03/08/20     No documented travel since 03/08/20
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
## 2020-04-13 ENCOUNTER — Ambulatory Visit: Payer: Medicare Other | Attending: Surgery | Admitting: Surgery

## 2020-04-13 ENCOUNTER — Encounter (INDEPENDENT_AMBULATORY_CARE_PROVIDER_SITE_OTHER): Payer: Self-pay | Admitting: Surgery

## 2020-04-13 ENCOUNTER — Other Ambulatory Visit: Payer: Self-pay

## 2020-04-13 VITALS — BP 135/74 | HR 80 | Temp 97.7°F | Resp 18 | Ht 66.0 in | Wt 122.2 lb

## 2020-04-13 DIAGNOSIS — T25022D Burn of unspecified degree of left foot, subsequent encounter: Secondary | ICD-10-CM | POA: Insufficient documentation

## 2020-04-13 DIAGNOSIS — T24202D Burn of second degree of unspecified site of left lower limb, except ankle and foot, subsequent encounter: Secondary | ICD-10-CM | POA: Insufficient documentation

## 2020-04-13 DIAGNOSIS — T25222D Burn of second degree of left foot, subsequent encounter: Secondary | ICD-10-CM

## 2020-04-13 DIAGNOSIS — T2122XD Burn of second degree of abdominal wall, subsequent encounter: Secondary | ICD-10-CM | POA: Insufficient documentation

## 2020-04-13 NOTE — Nursing Note (Signed)
Travel Screening     Question   Response    In the last month, have you been in contact with someone who was confirmed or suspected to have Coronavirus / COVID-19?  No / Unsure    Have you had a COVID-19 viral test in the last 14 days?  No    Do you have any of the following new or worsening symptoms?  None of these    Have you traveled internationally or domestically in the last month?  No      Travel History   Travel since 03/13/20     No documented travel since 03/13/20

## 2020-04-15 ENCOUNTER — Ambulatory Visit: Payer: Medicare Other | Attending: Surgery

## 2020-04-15 ENCOUNTER — Other Ambulatory Visit: Payer: Self-pay

## 2020-04-15 VITALS — BP 134/75 | HR 81 | Temp 98.2°F | Resp 18 | Ht 66.0 in | Wt 122.0 lb

## 2020-04-15 DIAGNOSIS — T24202D Burn of second degree of unspecified site of left lower limb, except ankle and foot, subsequent encounter: Secondary | ICD-10-CM | POA: Insufficient documentation
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
## 2020-04-19 ENCOUNTER — Other Ambulatory Visit: Payer: Self-pay

## 2020-04-19 ENCOUNTER — Encounter (INDEPENDENT_AMBULATORY_CARE_PROVIDER_SITE_OTHER): Payer: Self-pay | Admitting: Surgery

## 2020-04-19 ENCOUNTER — Ambulatory Visit: Payer: Medicare Other | Attending: Surgery | Admitting: Surgery

## 2020-04-19 VITALS — BP 75/51 | HR 75 | Temp 97.3°F | Resp 18 | Ht 66.0 in | Wt 120.8 lb

## 2020-04-19 DIAGNOSIS — Z79899 Other long term (current) drug therapy: Secondary | ICD-10-CM | POA: Insufficient documentation

## 2020-04-19 DIAGNOSIS — N183 Chronic kidney disease, stage 3 unspecified (CMS HCC): Secondary | ICD-10-CM | POA: Insufficient documentation

## 2020-04-19 DIAGNOSIS — T24392D Burn of third degree of multiple sites of left lower limb, except ankle and foot, subsequent encounter: Secondary | ICD-10-CM | POA: Insufficient documentation

## 2020-04-19 DIAGNOSIS — I480 Paroxysmal atrial fibrillation: Secondary | ICD-10-CM | POA: Insufficient documentation

## 2020-04-19 DIAGNOSIS — T25222D Burn of second degree of left foot, subsequent encounter: Secondary | ICD-10-CM | POA: Insufficient documentation

## 2020-04-19 DIAGNOSIS — Z8673 Personal history of transient ischemic attack (TIA), and cerebral infarction without residual deficits: Secondary | ICD-10-CM | POA: Insufficient documentation

## 2020-04-19 DIAGNOSIS — Z7984 Long term (current) use of oral hypoglycemic drugs: Secondary | ICD-10-CM | POA: Insufficient documentation

## 2020-04-19 DIAGNOSIS — Z9049 Acquired absence of other specified parts of digestive tract: Secondary | ICD-10-CM | POA: Insufficient documentation

## 2020-04-19 DIAGNOSIS — I2510 Atherosclerotic heart disease of native coronary artery without angina pectoris: Secondary | ICD-10-CM | POA: Insufficient documentation

## 2020-04-19 DIAGNOSIS — Z96651 Presence of right artificial knee joint: Secondary | ICD-10-CM | POA: Insufficient documentation

## 2020-04-19 DIAGNOSIS — I129 Hypertensive chronic kidney disease with stage 1 through stage 4 chronic kidney disease, or unspecified chronic kidney disease: Secondary | ICD-10-CM | POA: Insufficient documentation

## 2020-04-19 DIAGNOSIS — E1122 Type 2 diabetes mellitus with diabetic chronic kidney disease: Secondary | ICD-10-CM | POA: Insufficient documentation

## 2020-04-19 DIAGNOSIS — T25332D Burn of third degree of left toe(s) (nail), subsequent encounter: Secondary | ICD-10-CM | POA: Insufficient documentation

## 2020-04-19 DIAGNOSIS — Z950 Presence of cardiac pacemaker: Secondary | ICD-10-CM | POA: Insufficient documentation

## 2020-04-19 DIAGNOSIS — Z791 Long term (current) use of non-steroidal anti-inflammatories (NSAID): Secondary | ICD-10-CM | POA: Insufficient documentation

## 2020-04-19 DIAGNOSIS — E785 Hyperlipidemia, unspecified: Secondary | ICD-10-CM | POA: Insufficient documentation

## 2020-04-19 DIAGNOSIS — E1140 Type 2 diabetes mellitus with diabetic neuropathy, unspecified: Secondary | ICD-10-CM | POA: Insufficient documentation

## 2020-04-19 DIAGNOSIS — D649 Anemia, unspecified: Secondary | ICD-10-CM | POA: Insufficient documentation

## 2020-04-19 DIAGNOSIS — T24202D Burn of second degree of unspecified site of left lower limb, except ankle and foot, subsequent encounter: Secondary | ICD-10-CM

## 2020-04-19 DIAGNOSIS — K219 Gastro-esophageal reflux disease without esophagitis: Secondary | ICD-10-CM | POA: Insufficient documentation

## 2020-04-19 DIAGNOSIS — T2122XD Burn of second degree of abdominal wall, subsequent encounter: Secondary | ICD-10-CM

## 2020-04-19 DIAGNOSIS — Z96641 Presence of right artificial hip joint: Secondary | ICD-10-CM | POA: Insufficient documentation

## 2020-04-19 DIAGNOSIS — T25322D Burn of third degree of left foot, subsequent encounter: Secondary | ICD-10-CM
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
## 2020-04-19 NOTE — Nursing Note (Signed)
Travel Screening     Question   Response    In the last month, have you been in contact with someone who was confirmed or suspected to have Coronavirus / COVID-19?  No / Unsure    Have you had a COVID-19 viral test in the last 14 days?  No    Do you have any of the following new or worsening symptoms?  None of these    Have you traveled internationally or domestically in the last month?  No      Travel History   Travel since 03/19/20     No documented travel since 03/19/20

## 2020-04-22 ENCOUNTER — Other Ambulatory Visit: Payer: Self-pay

## 2020-04-22 ENCOUNTER — Encounter (INDEPENDENT_AMBULATORY_CARE_PROVIDER_SITE_OTHER): Payer: Self-pay | Admitting: Surgery

## 2020-04-22 ENCOUNTER — Ambulatory Visit: Payer: Medicare Other | Attending: Surgery | Admitting: Surgery

## 2020-04-22 VITALS — BP 118/78 | HR 70 | Temp 97.8°F | Resp 18 | Ht 66.0 in | Wt 120.0 lb

## 2020-04-22 DIAGNOSIS — T25332D Burn of third degree of left toe(s) (nail), subsequent encounter: Secondary | ICD-10-CM | POA: Insufficient documentation

## 2020-04-22 DIAGNOSIS — T24392D Burn of third degree of multiple sites of left lower limb, except ankle and foot, subsequent encounter: Secondary | ICD-10-CM | POA: Insufficient documentation

## 2020-04-22 DIAGNOSIS — Z8673 Personal history of transient ischemic attack (TIA), and cerebral infarction without residual deficits: Secondary | ICD-10-CM | POA: Insufficient documentation

## 2020-04-22 DIAGNOSIS — E1140 Type 2 diabetes mellitus with diabetic neuropathy, unspecified: Secondary | ICD-10-CM | POA: Insufficient documentation

## 2020-04-22 DIAGNOSIS — M069 Rheumatoid arthritis, unspecified: Secondary | ICD-10-CM | POA: Insufficient documentation

## 2020-04-22 DIAGNOSIS — Z791 Long term (current) use of non-steroidal anti-inflammatories (NSAID): Secondary | ICD-10-CM | POA: Insufficient documentation

## 2020-04-22 DIAGNOSIS — Z7984 Long term (current) use of oral hypoglycemic drugs: Secondary | ICD-10-CM | POA: Insufficient documentation

## 2020-04-22 DIAGNOSIS — I480 Paroxysmal atrial fibrillation: Secondary | ICD-10-CM | POA: Insufficient documentation

## 2020-04-22 DIAGNOSIS — T25322D Burn of third degree of left foot, subsequent encounter: Secondary | ICD-10-CM | POA: Insufficient documentation
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
## 2020-04-22 NOTE — Nursing Note (Signed)
Travel Screening     Question   Response    In the last month, have you been in contact with someone who was confirmed or suspected to have Coronavirus / COVID-19?  No / Unsure    Have you had a COVID-19 viral test in the last 14 days?  No    Do you have any of the following new or worsening symptoms?  None of these    Have you traveled internationally or domestically in the last month?  No      Travel History   Travel since 03/22/20     No documented travel since 03/22/20

## 2020-04-25 ENCOUNTER — Other Ambulatory Visit (INDEPENDENT_AMBULATORY_CARE_PROVIDER_SITE_OTHER): Payer: Self-pay | Admitting: Surgery

## 2020-04-25 MED ORDER — GABAPENTIN 100 MG CAPSULE
100.0000 mg | ORAL_CAPSULE | Freq: Three times a day (TID) | ORAL | 1 refills | Status: DC | PRN
Start: 2020-04-25 — End: 2020-05-12

## 2020-04-26 ENCOUNTER — Ambulatory Visit (INDEPENDENT_AMBULATORY_CARE_PROVIDER_SITE_OTHER): Payer: Self-pay | Admitting: Foot & Ankle Surgery
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
## 2020-04-27 ENCOUNTER — Encounter (INDEPENDENT_AMBULATORY_CARE_PROVIDER_SITE_OTHER): Payer: Self-pay | Admitting: Surgery

## 2020-04-27 ENCOUNTER — Other Ambulatory Visit: Payer: Self-pay

## 2020-04-27 ENCOUNTER — Ambulatory Visit: Payer: Medicare Other | Attending: Surgery | Admitting: Surgery

## 2020-04-27 VITALS — BP 123/78 | HR 78 | Temp 98.2°F | Resp 18 | Ht 66.0 in | Wt 119.2 lb

## 2020-04-27 DIAGNOSIS — Z79899 Other long term (current) drug therapy: Secondary | ICD-10-CM | POA: Insufficient documentation

## 2020-04-27 DIAGNOSIS — I480 Paroxysmal atrial fibrillation: Secondary | ICD-10-CM | POA: Insufficient documentation

## 2020-04-27 DIAGNOSIS — T25332D Burn of third degree of left toe(s) (nail), subsequent encounter: Secondary | ICD-10-CM

## 2020-04-27 DIAGNOSIS — E1122 Type 2 diabetes mellitus with diabetic chronic kidney disease: Secondary | ICD-10-CM | POA: Insufficient documentation

## 2020-04-27 DIAGNOSIS — M069 Rheumatoid arthritis, unspecified: Secondary | ICD-10-CM | POA: Insufficient documentation

## 2020-04-27 DIAGNOSIS — T25322D Burn of third degree of left foot, subsequent encounter: Secondary | ICD-10-CM | POA: Insufficient documentation

## 2020-04-27 DIAGNOSIS — Z7984 Long term (current) use of oral hypoglycemic drugs: Secondary | ICD-10-CM | POA: Insufficient documentation

## 2020-04-27 DIAGNOSIS — N183 Chronic kidney disease, stage 3 unspecified (CMS HCC): Secondary | ICD-10-CM | POA: Insufficient documentation

## 2020-04-27 DIAGNOSIS — Z8673 Personal history of transient ischemic attack (TIA), and cerebral infarction without residual deficits: Secondary | ICD-10-CM | POA: Insufficient documentation

## 2020-04-27 DIAGNOSIS — T24392D Burn of third degree of multiple sites of left lower limb, except ankle and foot, subsequent encounter: Secondary | ICD-10-CM | POA: Insufficient documentation

## 2020-04-27 NOTE — Nursing Note (Signed)
Travel Screening     Question   Response    In the last month, have you been in contact with someone who was confirmed or suspected to have Coronavirus / COVID-19?  No / Unsure    Have you had a COVID-19 viral test in the last 14 days?  No    Do you have any of the following new or worsening symptoms?  None of these    Have you traveled internationally or domestically in the last month?  No      Travel History   Travel since 03/27/20     No documented travel since 03/27/20

## 2020-04-30 ENCOUNTER — Ambulatory Visit: Payer: Medicare Other | Attending: NURSE PRACTITIONER

## 2020-04-30 ENCOUNTER — Other Ambulatory Visit: Payer: Self-pay

## 2020-04-30 ENCOUNTER — Ambulatory Visit (HOSPITAL_COMMUNITY): Payer: Medicare Other

## 2020-04-30 DIAGNOSIS — D631 Anemia in chronic kidney disease: Secondary | ICD-10-CM | POA: Insufficient documentation

## 2020-04-30 DIAGNOSIS — I129 Hypertensive chronic kidney disease with stage 1 through stage 4 chronic kidney disease, or unspecified chronic kidney disease: Secondary | ICD-10-CM

## 2020-04-30 DIAGNOSIS — N183 Chronic kidney disease, stage 3 unspecified: Secondary | ICD-10-CM | POA: Insufficient documentation

## 2020-04-30 DIAGNOSIS — N189 Chronic kidney disease, unspecified: Secondary | ICD-10-CM | POA: Insufficient documentation

## 2020-04-30 DIAGNOSIS — I119 Hypertensive heart disease without heart failure: Secondary | ICD-10-CM | POA: Insufficient documentation

## 2020-04-30 LAB — MICROALBUMIN/CREATININE RATIO, URINE, RANDOM
CREATININE RANDOM URINE: 72 mg/dL (ref 30–125)
MICROALBUMIN RANDOM URINE: 7.4 mg/dL — ABNORMAL HIGH (ref 0.0–2.0)
MICROALBUMIN/CREATININE RATIO RANDOM URINE: 102.8 mg/g — ABNORMAL HIGH (ref 0.0–30.0)

## 2020-04-30 LAB — RENAL FUNCTION PANEL
ALBUMIN: 4.1 g/dL (ref 3.5–5.2)
ANION GAP: 9 mmol/L — ABNORMAL LOW (ref 10–21)
BUN/CREA RATIO: 21
BUN: 24 mg/dL — ABNORMAL HIGH (ref 6–20)
CALCIUM: 9.7 mg/dL (ref 8.4–10.5)
CHLORIDE: 108 mmol/L — ABNORMAL HIGH (ref 96–107)
CO2 TOTAL: 22 mmol/L (ref 22–29)
CREATININE: 1.17 mg/dL — ABNORMAL HIGH (ref 0.50–0.90)
ESTIMATED GFR: 43 mL/min/{1.73_m2} — ABNORMAL LOW (ref >60)
GLUCOSE: 120 mg/dL — ABNORMAL HIGH (ref 74–106)
PHOSPHORUS: 3.3 mg/dL (ref 2.5–4.5)
POTASSIUM: 4.3 mmol/L (ref 3.5–5.1)
SODIUM: 139 mmol/L (ref 136–145)

## 2020-04-30 LAB — CBC
HCT: 39.2 % (ref 34.8–46.0)
HGB: 12.6 g/dL (ref 11.5–16.0)
MCH: 30.1 pg (ref 26.0–32.0)
MCHC: 32.1 g/dL (ref 31.0–35.5)
MCV: 93.6 fL (ref 78.0–100.0)
MPV: 9.6 fL (ref 8.7–12.5)
PLATELETS: 543 10*3/uL — ABNORMAL HIGH (ref 150–400)
RBC: 4.19 10*6/uL (ref 3.85–5.22)
RDW-CV: 13.9 % (ref 11.5–15.5)
WBC: 5.8 10*3/uL (ref 3.7–11.0)

## 2020-04-30 LAB — PROTEIN, TOTAL URINE, RANDOM: PROTEIN RANDOM URINE: 24 mg/dL — ABNORMAL HIGH (ref 0–12)
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
## 2020-05-02 ENCOUNTER — Ambulatory Visit: Payer: Medicare Other | Attending: Surgery | Admitting: Surgery

## 2020-05-02 ENCOUNTER — Other Ambulatory Visit: Payer: Self-pay

## 2020-05-02 ENCOUNTER — Encounter (INDEPENDENT_AMBULATORY_CARE_PROVIDER_SITE_OTHER): Payer: Self-pay | Admitting: Surgery

## 2020-05-02 VITALS — BP 128/75 | HR 82 | Temp 98.2°F | Resp 18 | Ht 66.0 in

## 2020-05-02 DIAGNOSIS — I480 Paroxysmal atrial fibrillation: Secondary | ICD-10-CM | POA: Insufficient documentation

## 2020-05-02 DIAGNOSIS — E1122 Type 2 diabetes mellitus with diabetic chronic kidney disease: Secondary | ICD-10-CM | POA: Insufficient documentation

## 2020-05-02 DIAGNOSIS — T24392D Burn of third degree of multiple sites of left lower limb, except ankle and foot, subsequent encounter: Secondary | ICD-10-CM | POA: Insufficient documentation

## 2020-05-02 DIAGNOSIS — T25332D Burn of third degree of left toe(s) (nail), subsequent encounter: Secondary | ICD-10-CM | POA: Insufficient documentation

## 2020-05-02 DIAGNOSIS — M069 Rheumatoid arthritis, unspecified: Secondary | ICD-10-CM | POA: Insufficient documentation

## 2020-05-02 DIAGNOSIS — Z7984 Long term (current) use of oral hypoglycemic drugs: Secondary | ICD-10-CM | POA: Insufficient documentation

## 2020-05-02 DIAGNOSIS — E1140 Type 2 diabetes mellitus with diabetic neuropathy, unspecified: Secondary | ICD-10-CM | POA: Insufficient documentation

## 2020-05-02 DIAGNOSIS — Z79899 Other long term (current) drug therapy: Secondary | ICD-10-CM | POA: Insufficient documentation

## 2020-05-02 DIAGNOSIS — T25322D Burn of third degree of left foot, subsequent encounter: Secondary | ICD-10-CM | POA: Insufficient documentation

## 2020-05-02 DIAGNOSIS — N183 Chronic kidney disease, stage 3 unspecified (CMS HCC): Secondary | ICD-10-CM | POA: Insufficient documentation

## 2020-05-02 NOTE — Nursing Note (Signed)
Travel Screening     Question   Response    In the last month, have you been in contact with someone who was confirmed or suspected to have Coronavirus / COVID-19?  No / Unsure    Have you had a COVID-19 viral test in the last 14 days?  No    Do you have any of the following new or worsening symptoms?  None of these    Have you traveled internationally or domestically in the last month?  No      Travel History   Travel since 04/01/20     No documented travel since 04/01/20

## 2020-05-03 ENCOUNTER — Ambulatory Visit (INDEPENDENT_AMBULATORY_CARE_PROVIDER_SITE_OTHER): Payer: Self-pay | Admitting: Foot & Ankle Surgery

## 2020-05-05 ENCOUNTER — Ambulatory Visit (INDEPENDENT_AMBULATORY_CARE_PROVIDER_SITE_OTHER): Payer: Self-pay | Admitting: Internal Medicine

## 2020-05-05 DIAGNOSIS — I129 Hypertensive chronic kidney disease with stage 1 through stage 4 chronic kidney disease, or unspecified chronic kidney disease: Secondary | ICD-10-CM | POA: Insufficient documentation

## 2020-05-06 ENCOUNTER — Emergency Department (EMERGENCY_DEPARTMENT_HOSPITAL): Payer: Medicare Other

## 2020-05-06 ENCOUNTER — Encounter (HOSPITAL_COMMUNITY): Payer: Self-pay

## 2020-05-06 ENCOUNTER — Emergency Department (HOSPITAL_COMMUNITY): Payer: Medicare Other

## 2020-05-06 ENCOUNTER — Other Ambulatory Visit: Payer: Self-pay

## 2020-05-06 ENCOUNTER — Emergency Department (HOSPITAL_COMMUNITY)
Admission: EM | Admit: 2020-05-06 | Discharge: 2020-05-07 | Disposition: A | Discharge status: Other Acute Inpatient Hospital | Payer: Medicare Other | Source: Home / Self Care | Attending: Emergency Medicine | Admitting: Emergency Medicine

## 2020-05-06 ENCOUNTER — Emergency Department
Admission: EM | Admit: 2020-05-06 | Discharge: 2020-05-06 | Disposition: A | Discharge status: Home / Self Care | Payer: Medicare Other | Attending: Emergency Medicine | Admitting: Emergency Medicine

## 2020-05-06 DIAGNOSIS — W19XXXA Unspecified fall, initial encounter: Secondary | ICD-10-CM

## 2020-05-06 DIAGNOSIS — R55 Syncope and collapse: Secondary | ICD-10-CM | POA: Insufficient documentation

## 2020-05-06 DIAGNOSIS — W1812XA Fall from or off toilet with subsequent striking against object, initial encounter: Secondary | ICD-10-CM | POA: Insufficient documentation

## 2020-05-06 DIAGNOSIS — S8012XA Contusion of left lower leg, initial encounter: Secondary | ICD-10-CM | POA: Insufficient documentation

## 2020-05-06 DIAGNOSIS — Z8673 Personal history of transient ischemic attack (TIA), and cerebral infarction without residual deficits: Secondary | ICD-10-CM | POA: Insufficient documentation

## 2020-05-06 DIAGNOSIS — W1789XA Other fall from one level to another, initial encounter: Secondary | ICD-10-CM | POA: Insufficient documentation

## 2020-05-06 DIAGNOSIS — S72401A Unspecified fracture of lower end of right femur, initial encounter for closed fracture: Secondary | ICD-10-CM

## 2020-05-06 DIAGNOSIS — D649 Anemia, unspecified: Secondary | ICD-10-CM

## 2020-05-06 DIAGNOSIS — S7002XA Contusion of left hip, initial encounter: Secondary | ICD-10-CM | POA: Insufficient documentation

## 2020-05-06 DIAGNOSIS — S7012XA Contusion of left thigh, initial encounter: Secondary | ICD-10-CM

## 2020-05-06 DIAGNOSIS — Z20822 Contact with and (suspected) exposure to covid-19: Secondary | ICD-10-CM | POA: Insufficient documentation

## 2020-05-06 DIAGNOSIS — Z96642 Presence of left artificial hip joint: Secondary | ICD-10-CM

## 2020-05-06 DIAGNOSIS — Z96653 Presence of artificial knee joint, bilateral: Secondary | ICD-10-CM

## 2020-05-06 DIAGNOSIS — T148XXA Other injury of unspecified body region, initial encounter: Secondary | ICD-10-CM

## 2020-05-06 DIAGNOSIS — Z96651 Presence of right artificial knee joint: Secondary | ICD-10-CM

## 2020-05-06 DIAGNOSIS — M9711XA Periprosthetic fracture around internal prosthetic right knee joint, initial encounter: Secondary | ICD-10-CM

## 2020-05-06 DIAGNOSIS — Y92002 Bathroom of unspecified non-institutional (private) residence single-family (private) house as the place of occurrence of the external cause: Secondary | ICD-10-CM | POA: Insufficient documentation

## 2020-05-06 DIAGNOSIS — S7011XA Contusion of right thigh, initial encounter: Secondary | ICD-10-CM

## 2020-05-06 LAB — BASIC METABOLIC PANEL
ANION GAP: 10 mmol/L (ref 10–21)
BUN/CREA RATIO: 22
BUN: 40 mg/dL — ABNORMAL HIGH (ref 6–20)
CALCIUM: 8.3 mg/dL — ABNORMAL LOW (ref 8.4–10.5)
CHLORIDE: 110 mmol/L — ABNORMAL HIGH (ref 96–107)
CO2 TOTAL: 20 mmol/L — ABNORMAL LOW (ref 22–29)
CREATININE: 1.81 mg/dL — ABNORMAL HIGH (ref 0.50–0.90)
ESTIMATED GFR: 26 mL/min/{1.73_m2} — ABNORMAL LOW (ref >60)
GLUCOSE: 142 mg/dL — ABNORMAL HIGH (ref 74–106)
POTASSIUM: 4.9 mmol/L (ref 3.5–5.1)
SODIUM: 140 mmol/L (ref 136–145)

## 2020-05-06 LAB — CBC WITH DIFF
BASOPHIL #: <0.1 10*3/uL (ref <=0.20)
BASOPHIL %: 0 %
EOSINOPHIL #: <0.1 10*3/uL (ref <=0.50)
EOSINOPHIL %: 0 %
HCT: 30.3 % — ABNORMAL LOW (ref 34.8–46.0)
HGB: 9.2 g/dL — ABNORMAL LOW (ref 11.5–16.0)
IMMATURE GRANULOCYTE #: 0.1 10*3/uL — ABNORMAL HIGH (ref <0.10)
IMMATURE GRANULOCYTE %: 1 % (ref 0–1)
LYMPHOCYTE #: 1.11 10*3/uL (ref 1.00–4.80)
LYMPHOCYTE %: 8 %
MCH: 30.2 pg (ref 26.0–32.0)
MCHC: 30.4 g/dL — ABNORMAL LOW (ref 31.0–35.5)
MCV: 99.3 fL (ref 78.0–100.0)
MONOCYTE #: 1.03 10*3/uL (ref 0.20–1.10)
MONOCYTE %: 7 %
MPV: 10.3 fL (ref 8.7–12.5)
NEUTROPHIL #: 11.86 10*3/uL — ABNORMAL HIGH (ref 1.50–7.70)
NEUTROPHIL %: 84 %
PLATELETS: 297 10*3/uL (ref 150–400)
RBC: 3.05 10*6/uL — ABNORMAL LOW (ref 3.85–5.22)
RDW-CV: 15 % (ref 11.5–15.5)
WBC: 14.1 10*3/uL — ABNORMAL HIGH (ref 3.7–11.0)

## 2020-05-06 LAB — TROPONIN-T: TROPONIN-T: <0.01 ng/mL (ref 0.000–0.010)

## 2020-05-06 LAB — LACTIC ACID LEVEL: LACTIC ACID: 3.6 mmol/L (ref 0.5–2.2)

## 2020-05-06 MED ORDER — ONDANSETRON HCL (PF) 4 MG/2 ML INJECTION SOLUTION
4.0000 mg | INTRAMUSCULAR | Status: AC
Start: 2020-05-06 — End: 2020-05-06
  Administered 2020-05-06: 4 mg via INTRAVENOUS
  Filled 2020-05-06: qty 2

## 2020-05-06 MED ORDER — MORPHINE 4 MG/ML INJECTION SYRINGE
4.0000 mg | INJECTION | INTRAMUSCULAR | Status: AC
Start: 2020-05-06 — End: 2020-05-06
  Administered 2020-05-06: 4 mg via INTRAVENOUS
  Filled 2020-05-06: qty 1

## 2020-05-06 MED ORDER — DOXYCYCLINE HYCLATE 100 MG CAPSULE
100.0000 mg | ORAL_CAPSULE | Freq: Two times a day (BID) | ORAL | 0 refills | Status: DC
Start: 2020-05-06 — End: 2020-05-12

## 2020-05-06 MED ORDER — ONDANSETRON HCL (PF) 4 MG/2 ML INJECTION SOLUTION
4.0000 mg | INTRAMUSCULAR | Status: AC
Start: 2020-05-07 — End: 2020-05-07
  Administered 2020-05-07: 4 mg via INTRAVENOUS
  Filled 2020-05-06: qty 2

## 2020-05-06 MED ORDER — MORPHINE 4 MG/ML INJECTION SYRINGE
4.0000 mg | INJECTION | INTRAMUSCULAR | Status: AC
Start: 2020-05-07 — End: 2020-05-07
  Administered 2020-05-07: 4 mg via INTRAVENOUS
  Filled 2020-05-06: qty 1

## 2020-05-06 MED ORDER — SODIUM CHLORIDE 0.9 % IV BOLUS
500.0000 mL | INJECTION | Status: AC
Start: 2020-05-06 — End: 2020-05-06
  Administered 2020-05-06: 0 mL via INTRAVENOUS
  Administered 2020-05-06: 500 mL via INTRAVENOUS
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
## 2020-05-06 NOTE — ED Nurses Note (Signed)
Resting with eyes closed. No acute distress.

## 2020-05-06 NOTE — ED Nurses Note (Signed)
Pt states pain has improved but still hurts. Pt wounds on her left leg cleansed with ns then dried. Pt has one sm lac to leg with a sm skin tear. No active bleeding at present.
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
## 2020-05-06 NOTE — ED Provider Notes (Signed)
Department of Emergency Medicine  [facility]  05/06/2020      Patient is a 85 y.o. female presenting to the ED with chief complaint of Syncope       HPI   85 year old female presents to the emergency department with complaints of 2 syncopal episodes at home after she got home from the emergency department. Patient states that she was walking to the door after getting out of the care, felt sick and husband caught her as she melted to the ground.  EMS states that she had a 2nd syncopal episode while they were on scene.  Currently, patient states that she feels very nauseated and is having right knee pain.   Patient initially presented this morning after a fall at home at 4am while attempting to sit on the toilet, she fell and hit her left side on the shower.  She was not able to get up on her own and had left hip and lower leg pain, which was evaluated in ED. Xrays were negative for fx, but pt was noted to have a large hematoma on L hip area. She is also seeing Dr. Leta Jungling for wound care of a burn on the L lateral ankle.      Review of Systems   Constitutional: Negative for chills and fever.   Respiratory: Negative for shortness of breath.    Cardiovascular: Negative for chest pain.   Gastrointestinal: Positive for nausea and vomiting. Negative for abdominal pain, blood in stool and melena.   Musculoskeletal: Positive for falls, joint pain and myalgias.   Skin:        L leg wounds   Neurological: Positive for dizziness. Negative for sensory change and focal weakness.        Syncope   All other systems reviewed and are negative.       All other systems reviewed and are negative, unless commented on in the HPI.     Past Medical History:   Diagnosis Date    Anemia 12/25/2019    Last Assessment & Plan:  Formatting of this note might be different from the original. Hgb stable    CAD S/P percutaneous coronary angioplasty 08/06/2014    Formatting of this note might be different from the original. Pt  reports 5 years ago    Charcot's joint of foot, left 08/06/2014    Chronic osteoarthritis 08/06/2014    Gastroesophageal reflux disease 08/06/2014    History of cardiac pacemaker 08/06/2014    History of CVA (cerebrovascular accident) 08/06/2014    Formatting of this note might be different from the original. 1995, no residual deficits    HTN (hypertension)     Hyperlipidemia 12/25/2019    Neuropathy (CMS HCC)     PAF (paroxysmal atrial fibrillation) (CMS [place]) 08/06/2014    PUD (peptic ulcer disease) 08/06/2014    Formatting of this note might be different from the original. C/b GIB Spring 2015    Rheumatoid arthritis (CMS [place]) 08/06/2014    Stage 3 chronic kidney disease 12/25/2019    Last Assessment & Plan:  Formatting of this note might be different from the original. Renal function stable, at baseline Stable electrolytes; restart sodium bicarb 650 mg BID  Continue renal protective ARB Discussed CKD risk factor modifications, including tight BP/glucose control, low protein diet, and avoidance of NSAIDs    Type 2 diabetes mellitus (CMS [place]) 08/06/2014    Vitamin D deficiency 12/25/2019     Past Surgical History:   Procedure Laterality  Date    Coronary artery angioplasty  2011    Hx back surgery      Hx cholecystectomy      Hx hip replacement Right     Hx nissen fundoplication  6415    Hx open cholecystectomy  1962    Hx pacemaker insertion  2019    Knee arthroplasty Right     Pancreatic pseudocyst drainage  1971     Allergies   Allergen Reactions    Iodinated Contrast Media Anaphylaxis    Etodolac NO Steroids unless approved by Attending Physician    Codeine  Other Adverse Reaction (Add comment) and Nausea/ Vomiting     Stomach pain  Stomach pain        No current outpatient medications on file.      Above history reviewed with patient.  Previous records also reviewed.     Physical Exam:     Filed Vitals:    05/06/20 2215 05/06/20 2230 05/06/20 2245 05/06/20 2330   BP:  126/62  131/71    Pulse: 70 70 70 70   Resp: 14 16 17 17    Temp:       SpO2: 96% 96% 96% 97%       Physical Exam  Vitals and nursing note reviewed.   Constitutional:       Appearance: Normal appearance.   HENT:      Head: Normocephalic and atraumatic.      Mouth/Throat:      Mouth: Mucous membranes are moist.   Eyes:      Extraocular Movements: Extraocular movements intact.      Pupils: Pupils are equal, round, and reactive to light.   Cardiovascular:      Rate and Rhythm: Normal rate and regular rhythm.      Heart sounds: Heart sounds are distant.   Pulmonary:      Effort: Pulmonary effort is normal. No respiratory distress.      Breath sounds: Normal breath sounds.   Abdominal:      General: There is no distension.   Musculoskeletal:      Cervical back: Normal range of motion.      Left upper leg: Swelling (large hematoma over L hip) present.      Right knee: Swelling present. Decreased range of motion. Tenderness present.      Left lower leg: Swelling (ecchymosis, abrasions) present.      Comments: Significant tenderness of R knee, unable to test ROM. There is some laxity with ant/post drawer.    Skin:     General: Skin is warm and dry.   Neurological:      General: No focal deficit present.      Mental Status: She is alert and oriented to person, place, and time.   Psychiatric:         Mood and Affect: Mood normal.         Behavior: Behavior normal.        Orders:  Orders Placed This Encounter    CT BRAIN WO IV CONTRAST    CT EXTREMITY LOWER BILATERAL WO IV CONTRAST    CANCELED: XR KNEE RIGHT 3 VIEW    CANCELED: XR FEMUR RIGHT    XR KNEE RIGHT 2 VIEW    CBC/DIFF    BASIC METABOLIC PANEL    LACTIC ACID LEVEL    TROPONIN-T    CBC WITH DIFF    COVID-19 - SCREENING - Admission (NON-PUI)    ECG 12 LEAD  CANCELED: INSERT & MAINTAIN PERIPHERAL IV ACCESS    ondansetron (ZOFRAN) 2 mg/mL injection    NS bolus infusion 500 mL    ondansetron (ZOFRAN) 2 mg/mL injection    morphine 4 mg/mL injection    ondansetron  (ZOFRAN) 2 mg/mL injection       Abnormal Lab results:  Labs Reviewed   BASIC METABOLIC PANEL - Abnormal; Notable for the following components:       Result Value    CHLORIDE 110 (*)     CO2 TOTAL 20 (*)     CALCIUM 8.3 (*)     GLUCOSE 142 (*)     BUN 40 (*)     CREATININE 1.81 (*)     ESTIMATED GFR 26 (*)     All other components within normal limits    Narrative:     Estimated Glomerular Filtration Rate (eGFR) calculated using the CKD-EPI (2009) equation, intended for patients 39 years of age and older. If race and/or gender is not documented or "unknown," there will be no eGFR calculation.   LACTIC ACID LEVEL - Abnormal; Notable for the following components:    LACTIC ACID 3.6 (*)     All other components within normal limits   CBC WITH DIFF - Abnormal; Notable for the following components:    WBC 14.1 (*)     RBC 3.05 (*)     HGB 9.2 (*)     HCT 30.3 (*)     MCHC 30.4 (*)     NEUTROPHIL # 11.86 (*)     IMMATURE GRANULOCYTE # 0.10 (*)     All other components within normal limits   TROPONIN-T - Normal   COVID-19 [place] MOLECULAR LAB TESTING - Normal    Narrative:     Results are for the qualitative identification of SARS-CoV-2 (formerly 2019-nCoV) RNA. The SARS-CoV-2 RNA is generally detectable in nasopharyngeal swabs and nasal wash/aspirates during the ACUTE PHASE of infection. Hence, this test is intended to be performed on respiratory specimens collected from individuals who meet Centers for Disease Control and Prevention (CDC) clinical and/or epidemiological criteria for Coronavirus Disease 2019 (COVID-19) testing. CDC COVID-19 criteria for testing on human specimens are available at [facility] webpage information for Healthcare Professionals: Coronavirus Disease 2019 (COVID-19) ([email or web address]).    Disclaimer:  This assay has been authorized by FDA under an Emergency Use Authorization for use in laboratories certified under the Clinical Laboratory Improvement Amendments of  1988 (CLIA), 42 U.S.C. ([phone], to perform high complexity tests. The impacts of vaccines, antiviral therapeutics, antibiotics, chemotherapeutic or immunosuppressant drugs have not been evaluated.    Test methodology:   Cepheid Xpert Xpress SARS-CoV-2 Assay real-time polymerase chain reaction (RT-PCR) test on the GeneXpert Dx system.   CBC/DIFF    Narrative:     The following orders were created for panel order CBC/DIFF.  Procedure                               Abnormality         Status                     ---------                               -----------         ------  CBC WITH OQHU[[id number]]                Abnormal            Final result                 Please view results for these tests on the individual orders.       Imaging:  Results for orders placed or performed during the hospital encounter of 05/06/20 (from the past 72 hour(s))   CT BRAIN WO IV CONTRAST     Status: None    Narrative    [place]  Female, 85 years old.    CT BRAIN WO IV CONTRAST performed on 05/06/2020 2:05 PM.    REASON FOR EXAM:  Syncope, history of CVA    COMPARISON: 09/04/2018: CT of facial bones/    FINDINGS:   No intracranial hemorrhage, hematoma or other abnormal fluid  collection is identified.  No masses or areas of mass effect or edema are  seen.  The ventricles and cisterns are within technically prominent for  size , age-related.  Gray white differentiation is preserved. Patchy  periventricular white matter low densities suggest chronic small vessel  ischemia. An additional more prominent focus in the left basal ganglia may  represent an old ischemic event. There is a  postsurgical bony defect noted  involving the right lateral posterior fossa. There is associated focal area  of encephalomalacia seen adjacent to the defect. This finding may relate to  patient's history of CVA surgery of other nature and appears chronic.  The  remainder osseous structures and included orbital structures  are  unremarkable.  The paranasal sinuses and mastoid air cells are well  pneumatized.      Impression    No surgical state (see above    Chronic small vessel ischemia    No hemorrhage hematoma or mass effect      Radiologist location ID: [id number]     CT EXTREMITY LOWER BILATERAL WO IV CONTRAST     Status: None    Narrative    Lilymae RUTH Hehr  Female, 85 years old.    CT EXTREMITY LOWER BILATERAL WO IV CONTRAST performed on 05/06/2020 7:22 PM.    REASON FOR EXAM:  hematoma of L lateral thigh and L LE; R knee injury    RADIATION DOSE: 1298.70 mGycm    TECHNIQUE: CT EXTREMITY LOWER BILATERAL WO IV CONTRAST performed on  05/06/2020 7:22 PM    INDICATION: 85 years old Female  hematoma of L lateral thigh and L LE; R knee injury    TECHNIQUE: Axial images from lung bases through the legs followed by  intravenous administration of nonionic contrast, with additional  multiplanar images in early arterial phase and below the knee runoff  imaging. Arterial coronal and sagittal reformats utilize MIP reconstruction  techniques    CONTRAST: None    RADIATION DOSE: 1298.70 mGycm    COMPARISON: None    VASCULAR FINDINGS: Age-related arteriovascular calcifications identified  involving arteries of the pelvis and leg bilaterally, no contrast  enhancement.    NONVASCULAR FINDINGS:    Patient has undergone multiple orthopedic surgical procedures including  total left hip arthroplasty in good alignment without dislocation and  loosening of the prosthesis, and a pinning of the right proximal femur with  good alignment of the fracture fragment.     There is prominent DJD of the adjacent right hip joint with joint space  narrowing and reactive sclerosis as  well as subchondral cyst formation.    There is a right total knee arthroplasty without loosening of the  prosthesis. There is an acute  comminuted distal femoral fracture with  associated large hematoma identified just above the knee arthroplasty.     There is a left total knee  arthroplasty without loosening of prosthesis in  proper alignment.     There is pinning of a healed distal left tibial fracture.    There is a large approximately 8 x 5 x 5 cm subcutaneous thigh hematoma  identified well marginated with surrounding stranding of the subcutaneous  tissue located lateral to the left hip joint. This is likely a  posttraumatic hematoma. An Additional smaller subcutaneous hematoma is  identified involving the left lower extremity laterally ( mid calf area )      Impression    Comminuted distal right femoral fracture just above the right  knee arthroplasty.    Large acute left thigh hematoma and smaller hematoma involving the left mid  thigh as described above.          Radiologist location ID: [id number]     XR KNEE RIGHT 2 VIEW     Status: None    Narrative    Sury RUTH Trang    Female, 85 years old.    XR KNEE RIGHT 2 VIEWS performed on 05/06/2020 9:24 PM.    REASON FOR EXAM:  distal femur fracture    TECHNIQUE: 2 views/2 images submitted for interpretation.    COMPARISON:  CT lower extremity same day.    FINDINGS:  Diffuse osteopenia. Complex periprosthetic fracture right distal  femur noted. There is mild displacement again seen. Post-traumatic soft  tissue swelling is evident. Partially visualized right femoral  intramedullary nail and prior surgical changes after right knee  arthroplasty. Vascular calcifications.      Impression    Periprosthetic fracture right distal femur with posttraumatic soft tissue  swelling/hemorrhage. Osteopenia.      Radiologist location ID: [id number]           Therapy/Procedures/Course/MDM:    Patient vitally stable throughout stay in the ED.   Discussed radiology findings with patient and Dr. Smiley Houseman. Pt turned over to Dr. Smiley Houseman for dispo/transfer at 9pm.  ED Course as of May 07 1849   Fri May 06, 2020   2348 I personally saw and evaluated the patient. See mid-level's note for additional details. My findings/participation are comminuted distal right  femur fracture involving hardware in the knee joint.  She has a 3 g drop in hemoglobin most likely due to large hematomas in the right thigh and left hip region.  At 9:50 p.m., transfer center at [facility] the and they do not have any available beds.  At 2300 I spoke with the transfer centers at [facility] and [facility] and they also have no available beds.  At 11:05 p.m. I called the transfer center at [facility] and got no answer.  At 11:08 p.m. I spoke with the transfer center at [facility] in [place] and they will accept the patient to the emergency department at their facility.  Dr. Blenda Mounts is the accepting physician in the ED.    Ardelle Lesches, DO       [SU]      ED Course User Index  [SU] Ardelle Lesches., DO     Impression:    Encounter Diagnoses   Name Primary?  Closed fracture of distal end of right femur, unspecified fracture morphology, initial encounter (CMS [place]) Yes    Hematoma of left hip     Hematoma of right thigh     Syncope, unspecified syncope type     Anemia, unspecified type        Disposition:    Transfered to Another Facility     Following the above history, physical exam, and studies, the patient was transferred.   The patient verbalized understanding of all instructions, had no further questions or concerns, and agreed to plan.      Follow Up:    No follow-up provider specified.  No future appointments.     Prescriptions:      Current Discharge Medication List      CONTINUE these medications - NO CHANGES were made during your visit.      Details   acetaminophen 325 mg Tablet  Commonly known as: TYLENOL   650 mg, Oral, EVERY 4 HOURS PRN  Refills: 0     amiodarone 200 mg Tablet  Commonly known as: PACERONE   200 mg, Oral, DAILY  Refills: 0     atorvastatin 20 mg Tablet  Commonly known as: LIPITOR   20 mg, Oral, DAILY  Refills: 0     clopidogreL 75 mg Tablet  Commonly known as:  PLAVIX   75 mg, Oral, DAILY  Refills: 0     Creon 36,000-114,000- 180,000 unit Capsule, Delayed Release(E.C.)  Generic drug: lipase-protease-amylase   1 Capsule, Oral, DAILY  Refills: 0     Dexlansoprazole 60 mg Cap, Delayed Rel., Multiphasic  Commonly known as: DEXILANT   60 mg, Oral, DAILY  Refills: 0     dicyclomine 20 mg Tablet  Commonly known as: BENTYL   60 mg, Oral, DAILY  Refills: 0     dorzolamide-timoloL 22.3-6.8 mg/mL Drops  Commonly known as: COSOPT   2 Drops, Right Eye, DAILY  Refills: 0     doxycycline hyclate 100 mg Capsule  Commonly known as: VIBRAMYCIN   100 mg, Oral, 2 TIMES DAILY  Qty: 14 Capsule  Refills: 0     ergocalciferol (vitamin D2) 1,250 mcg (50,000 unit) Capsule  Commonly known as: DRISDOL   50,000 Units, Oral, EVERY 7 DAYS  Refills: 0     ezetimibe 10 mg Tablet  Commonly known as: ZETIA   DAILY  Refills: 0     fluorouraciL 5 % Cream  Commonly known as: EFUDEX   DAILY  Refills: 0     gabapentin 100 mg Capsule  Commonly known as: NEURONTIN   100 mg, Oral, 3 TIMES DAILY PRN  Qty: 30 Capsule  Refills: 1     gemfibroziL 600 mg Tablet  Commonly known as: LOPID   600 mg, Oral, 2 TIMES DAILY BEFORE MEALS  Refills: 0     hydrALAZINE 25 mg Tablet  Commonly known as: APRESOLINE   25 mg, Oral, 2 TIMES DAILY  Refills: 0     HYDROcodone-acetaminophen 5-325 mg Tablet  Commonly known as: NORCO   1 Tablet, Oral, EVERY 6 HOURS PRN  Qty: 15 Tablet  Refills: 0     losartan 50 mg Tablet  Commonly known as: COZAAR   50 mg, Oral, DAILY  Refills: 0     metoprolol tartrate 25 mg Tablet  Commonly known as: LOPRESSOR   25 mg, Oral, DAILY  Refills: 0     NIFEdipine 30 mg Tablet Extended Rel 24 hr  Commonly known as: PROCARDIA XL  30 mg, Oral, 2 TIMES DAILY  Refills: 0     nitroGLYCERIN 0.4 mg Tablet, Sublingual  Commonly known as: NITROSTAT   0.4 mg, Sublingual, EVERY 5 MIN PRN, for 3 doses over 15 minutes   Refills: 0     ondansetron 4 mg Tablet, Rapid Dissolve  Commonly known as: ZOFRAN ODT   4 mg, Oral, EVERY 8  HOURS PRN  Qty: 12 Tablet  Refills: 0     sodium bicarbonate 650 mg Tablet   650 mg, Oral, 2 TIMES DAILY  Refills: 0            Attestation:     The co-signing Dr was present in the ED and available for consultation and did participate in the care of the patient.     This note was partly generated using a voice recognition software; please excuse any grammatical errors.       Colin Rhein, PA-C  05/06/2020, 13:49

## 2020-05-06 NOTE — ED Nurses Note (Signed)
Xray of rt knee done at pt bedside.

## 2020-05-06 NOTE — ED Nurses Note (Signed)
Resting quietly. Pupils remain equal and reactive to light. No acute distress.
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
## 2020-05-06 NOTE — ED Nurses Note (Signed)
Husband states his wife stated she "felt sick" before the episode happened. Patient is stating that she is having significant right knee pain but husband states that she did not fall or hit anything. She may have twisted her knee she believes.

## 2020-05-06 NOTE — Discharge Instructions (Addendum)
Follow up with your primary doctor on Tuesday as previously scheduled.  Follow up with Dr. Mendel Corning on Monday as previously scheduled for wound care.  Take over the counter acetaminophen as directed by manufacturer for pain.  Use cool/warm compresses as directed.  Take antibiotics as prescribed.  Return to the ER with increased pain, swelling, numbness or tingling in your toes, bleeding, or with any concerns.

## 2020-05-07 ENCOUNTER — Inpatient Hospital Stay (EMERGENCY_DEPARTMENT_HOSPITAL): Payer: Medicare Other

## 2020-05-07 ENCOUNTER — Emergency Department (EMERGENCY_DEPARTMENT_HOSPITAL): Payer: Medicare Other

## 2020-05-07 ENCOUNTER — Inpatient Hospital Stay (HOSPITAL_COMMUNITY): Payer: Medicare Other

## 2020-05-07 ENCOUNTER — Emergency Department (HOSPITAL_COMMUNITY): Payer: Medicare Other

## 2020-05-07 ENCOUNTER — Encounter (HOSPITAL_COMMUNITY)
Admission: EM | Disposition: A | Discharge status: Skilled Nursing Facility | Payer: Self-pay | Source: Home / Self Care | Attending: Geriatric Medicine

## 2020-05-07 ENCOUNTER — Inpatient Hospital Stay (HOSPITAL_COMMUNITY): Payer: Medicare Other | Admitting: Student in an Organized Health Care Education/Training Program

## 2020-05-07 ENCOUNTER — Inpatient Hospital Stay (HOSPITAL_COMMUNITY): Payer: PRIVATE HEALTH INSURANCE | Admitting: Surgery

## 2020-05-07 ENCOUNTER — Encounter (HOSPITAL_COMMUNITY): Payer: Self-pay | Admitting: Surgery

## 2020-05-07 ENCOUNTER — Inpatient Hospital Stay
Admission: EM | Admit: 2020-05-07 | Discharge: 2020-05-12 | DRG: 481 | Disposition: A | Discharge status: Skilled Nursing Facility | Payer: Medicare Other | Attending: Internal Medicine | Admitting: Internal Medicine

## 2020-05-07 DIAGNOSIS — S72401A Unspecified fracture of lower end of right femur, initial encounter for closed fracture: Secondary | ICD-10-CM

## 2020-05-07 DIAGNOSIS — M9711XA Periprosthetic fracture around internal prosthetic right knee joint, initial encounter: Secondary | ICD-10-CM

## 2020-05-07 DIAGNOSIS — N179 Acute kidney failure, unspecified: Secondary | ICD-10-CM | POA: Diagnosis present

## 2020-05-07 DIAGNOSIS — Z23 Encounter for immunization: Secondary | ICD-10-CM

## 2020-05-07 DIAGNOSIS — M80051A Age-related osteoporosis with current pathological fracture, right femur, initial encounter for fracture: Secondary | ICD-10-CM

## 2020-05-07 DIAGNOSIS — Z9861 Coronary angioplasty status: Secondary | ICD-10-CM

## 2020-05-07 DIAGNOSIS — M25552 Pain in left hip: Secondary | ICD-10-CM

## 2020-05-07 DIAGNOSIS — N1831 Chronic kidney disease, stage 3a: Secondary | ICD-10-CM | POA: Diagnosis present

## 2020-05-07 DIAGNOSIS — I480 Paroxysmal atrial fibrillation: Secondary | ICD-10-CM | POA: Diagnosis present

## 2020-05-07 DIAGNOSIS — S298XXA Other specified injuries of thorax, initial encounter: Secondary | ICD-10-CM

## 2020-05-07 DIAGNOSIS — Z96651 Presence of right artificial knee joint: Secondary | ICD-10-CM

## 2020-05-07 DIAGNOSIS — Z950 Presence of cardiac pacemaker: Secondary | ICD-10-CM

## 2020-05-07 DIAGNOSIS — Z96652 Presence of left artificial knee joint: Secondary | ICD-10-CM

## 2020-05-07 DIAGNOSIS — E875 Hyperkalemia: Secondary | ICD-10-CM | POA: Diagnosis present

## 2020-05-07 DIAGNOSIS — W19XXXA Unspecified fall, initial encounter: Secondary | ICD-10-CM | POA: Diagnosis present

## 2020-05-07 DIAGNOSIS — W1839XA Other fall on same level, initial encounter: Secondary | ICD-10-CM | POA: Diagnosis present

## 2020-05-07 DIAGNOSIS — I495 Sick sinus syndrome: Secondary | ICD-10-CM

## 2020-05-07 DIAGNOSIS — R413 Other amnesia: Secondary | ICD-10-CM

## 2020-05-07 DIAGNOSIS — Z7902 Long term (current) use of antithrombotics/antiplatelets: Secondary | ICD-10-CM

## 2020-05-07 DIAGNOSIS — S199XXA Unspecified injury of neck, initial encounter: Secondary | ICD-10-CM

## 2020-05-07 DIAGNOSIS — N281 Cyst of kidney, acquired: Secondary | ICD-10-CM | POA: Diagnosis present

## 2020-05-07 DIAGNOSIS — S7002XA Contusion of left hip, initial encounter: Secondary | ICD-10-CM

## 2020-05-07 DIAGNOSIS — X12XXXD Contact with other hot fluids, subsequent encounter: Secondary | ICD-10-CM | POA: Diagnosis present

## 2020-05-07 DIAGNOSIS — S7012XA Contusion of left thigh, initial encounter: Secondary | ICD-10-CM | POA: Diagnosis present

## 2020-05-07 DIAGNOSIS — E872 Acidosis: Secondary | ICD-10-CM | POA: Diagnosis present

## 2020-05-07 DIAGNOSIS — Z9889 Other specified postprocedural states: Secondary | ICD-10-CM

## 2020-05-07 DIAGNOSIS — M069 Rheumatoid arthritis, unspecified: Secondary | ICD-10-CM | POA: Diagnosis present

## 2020-05-07 DIAGNOSIS — Z45018 Encounter for adjustment and management of other part of cardiac pacemaker: Secondary | ICD-10-CM

## 2020-05-07 DIAGNOSIS — I491 Atrial premature depolarization: Secondary | ICD-10-CM

## 2020-05-07 DIAGNOSIS — M810 Age-related osteoporosis without current pathological fracture: Secondary | ICD-10-CM | POA: Diagnosis present

## 2020-05-07 DIAGNOSIS — S8992XA Unspecified injury of left lower leg, initial encounter: Secondary | ICD-10-CM

## 2020-05-07 DIAGNOSIS — Z79899 Other long term (current) drug therapy: Secondary | ICD-10-CM

## 2020-05-07 DIAGNOSIS — S7291XA Unspecified fracture of right femur, initial encounter for closed fracture: Secondary | ICD-10-CM

## 2020-05-07 DIAGNOSIS — M160 Bilateral primary osteoarthritis of hip: Secondary | ICD-10-CM

## 2020-05-07 DIAGNOSIS — W010XXA Fall on same level from slipping, tripping and stumbling without subsequent striking against object, initial encounter: Secondary | ICD-10-CM

## 2020-05-07 DIAGNOSIS — T24302D Burn of third degree of unspecified site of left lower limb, except ankle and foot, subsequent encounter: Secondary | ICD-10-CM

## 2020-05-07 DIAGNOSIS — R55 Syncope and collapse: Secondary | ICD-10-CM

## 2020-05-07 DIAGNOSIS — Z0389 Encounter for observation for other suspected diseases and conditions ruled out: Secondary | ICD-10-CM

## 2020-05-07 DIAGNOSIS — M1611 Unilateral primary osteoarthritis, right hip: Secondary | ICD-10-CM

## 2020-05-07 DIAGNOSIS — S72341A Displaced spiral fracture of shaft of right femur, initial encounter for closed fracture: Principal | ICD-10-CM | POA: Diagnosis present

## 2020-05-07 DIAGNOSIS — Z20822 Contact with and (suspected) exposure to covid-19: Secondary | ICD-10-CM | POA: Diagnosis not present

## 2020-05-07 DIAGNOSIS — D62 Acute posthemorrhagic anemia: Secondary | ICD-10-CM | POA: Diagnosis present

## 2020-05-07 DIAGNOSIS — E785 Hyperlipidemia, unspecified: Secondary | ICD-10-CM | POA: Diagnosis present

## 2020-05-07 DIAGNOSIS — Z96642 Presence of left artificial hip joint: Secondary | ICD-10-CM

## 2020-05-07 DIAGNOSIS — I2510 Atherosclerotic heart disease of native coronary artery without angina pectoris: Secondary | ICD-10-CM | POA: Diagnosis present

## 2020-05-07 DIAGNOSIS — Z9049 Acquired absence of other specified parts of digestive tract: Secondary | ICD-10-CM

## 2020-05-07 DIAGNOSIS — M1990 Unspecified osteoarthritis, unspecified site: Secondary | ICD-10-CM | POA: Diagnosis present

## 2020-05-07 DIAGNOSIS — K219 Gastro-esophageal reflux disease without esophagitis: Secondary | ICD-10-CM | POA: Diagnosis present

## 2020-05-07 DIAGNOSIS — K838 Other specified diseases of biliary tract: Secondary | ICD-10-CM

## 2020-05-07 DIAGNOSIS — R1310 Dysphagia, unspecified: Secondary | ICD-10-CM | POA: Diagnosis present

## 2020-05-07 DIAGNOSIS — E1122 Type 2 diabetes mellitus with diabetic chronic kidney disease: Secondary | ICD-10-CM | POA: Diagnosis present

## 2020-05-07 DIAGNOSIS — Z96641 Presence of right artificial hip joint: Secondary | ICD-10-CM | POA: Diagnosis present

## 2020-05-07 DIAGNOSIS — T25322D Burn of third degree of left foot, subsequent encounter: Secondary | ICD-10-CM

## 2020-05-07 DIAGNOSIS — I129 Hypertensive chronic kidney disease with stage 1 through stage 4 chronic kidney disease, or unspecified chronic kidney disease: Secondary | ICD-10-CM | POA: Diagnosis present

## 2020-05-07 DIAGNOSIS — Z01818 Encounter for other preprocedural examination: Secondary | ICD-10-CM

## 2020-05-07 DIAGNOSIS — K8689 Other specified diseases of pancreas: Secondary | ICD-10-CM

## 2020-05-07 DIAGNOSIS — E11610 Type 2 diabetes mellitus with diabetic neuropathic arthropathy: Secondary | ICD-10-CM | POA: Diagnosis present

## 2020-05-07 DIAGNOSIS — Z9081 Acquired absence of spleen: Secondary | ICD-10-CM

## 2020-05-07 DIAGNOSIS — M6282 Rhabdomyolysis: Secondary | ICD-10-CM | POA: Diagnosis present

## 2020-05-07 DIAGNOSIS — Z8673 Personal history of transient ischemic attack (TIA), and cerebral infarction without residual deficits: Secondary | ICD-10-CM

## 2020-05-07 DIAGNOSIS — R9431 Abnormal electrocardiogram [ECG] [EKG]: Secondary | ICD-10-CM

## 2020-05-07 DIAGNOSIS — S99912A Unspecified injury of left ankle, initial encounter: Secondary | ICD-10-CM

## 2020-05-07 LAB — PT/INR
INR: 1.03 (ref 0.80–1.20)
PROTHROMBIN TIME: 11.8 s (ref 9.1–13.9)

## 2020-05-07 LAB — CBC WITH DIFF
BASOPHIL #: <0.1 10*3/uL (ref <=0.20)
BASOPHIL #: <0.1 10*3/uL (ref <=0.20)
BASOPHIL %: 0 %
BASOPHIL %: 0 %
EOSINOPHIL #: <0.1 10*3/uL (ref <=0.50)
EOSINOPHIL #: <0.1 10*3/uL (ref <=0.50)
EOSINOPHIL %: 0 %
EOSINOPHIL %: 0 %
HCT: 25.9 % — ABNORMAL LOW (ref 34.8–46.0)
HCT: 21.7 % — ABNORMAL LOW (ref 34.8–46.0)
HGB: 8.3 g/dL — ABNORMAL LOW (ref 11.5–16.0)
HGB: 6.8 g/dL — CL (ref 11.5–16.0)
IMMATURE GRANULOCYTE #: 0.11 10*3/uL — ABNORMAL HIGH (ref <0.10)
IMMATURE GRANULOCYTE #: 0.11 10*3/uL — ABNORMAL HIGH (ref <0.10)
IMMATURE GRANULOCYTE %: 1 % (ref 0–1)
IMMATURE GRANULOCYTE %: 1 % (ref 0–1)
LYMPHOCYTE #: 1.65 10*3/uL (ref 1.00–4.80)
LYMPHOCYTE #: 0.7 10*3/uL — ABNORMAL LOW (ref 1.00–4.80)
LYMPHOCYTE %: 12 %
LYMPHOCYTE %: 4 %
MCH: 30.4 pg (ref 26.0–32.0)
MCH: 29.8 pg (ref 26.0–32.0)
MCHC: 32 g/dL (ref 31.0–35.5)
MCHC: 31.3 g/dL (ref 31.0–35.5)
MCV: 94.9 fL (ref 78.0–100.0)
MCV: 95.2 fL (ref 78.0–100.0)
MONOCYTE #: 1.17 10*3/uL — ABNORMAL HIGH (ref 0.20–1.10)
MONOCYTE #: 1.36 10*3/uL — ABNORMAL HIGH (ref 0.20–1.10)
MONOCYTE %: 8 %
MONOCYTE %: 8 %
MPV: 10.5 fL (ref 8.7–12.5)
MPV: 10.7 fL (ref 8.7–12.5)
NEUTROPHIL #: 11.17 10*3/uL — ABNORMAL HIGH (ref 1.50–7.70)
NEUTROPHIL #: 15.96 10*3/uL — ABNORMAL HIGH (ref 1.50–7.70)
NEUTROPHIL %: 79 %
NEUTROPHIL %: 87 %
PLATELETS: 319 10*3/uL (ref 150–400)
PLATELETS: 273 10*3/uL (ref 150–400)
RBC: 2.73 10*6/uL — ABNORMAL LOW (ref 3.85–5.22)
RBC: 2.28 10*6/uL — ABNORMAL LOW (ref 3.85–5.22)
RDW-CV: 15.4 % (ref 11.5–15.5)
RDW-CV: 14.8 % (ref 11.5–15.5)
WBC: 14.1 10*3/uL — ABNORMAL HIGH (ref 3.7–11.0)
WBC: 18.1 10*3/uL — ABNORMAL HIGH (ref 3.7–11.0)

## 2020-05-07 LAB — ARTERIAL BLOOD GAS/LACTATE/CO-OX/LYTES (NA/K/CA/CL/GLUC) (TEMP COMP) - ORS ONLY
BASE DEFICIT: 5.3 mmol/L — ABNORMAL HIGH (ref 0.0–3.0)
BASE DEFICIT: 8.6 mmol/L — ABNORMAL HIGH (ref 0.0–3.0)
CARBOXYHEMOGLOBIN: 0.8 % (ref 0.0–2.5)
CARBOXYHEMOGLOBIN: 0.3 % (ref 0.0–2.5)
IONIZED CALCIUM: 1.04 mmol/L — ABNORMAL LOW (ref 1.10–1.35)
IONIZED CALCIUM: 1.11 mmol/L (ref 1.10–1.35)
LACTATE: 0.9 mmol/L (ref 0.0–1.3)
LACTATE: 0.9 mmol/L (ref 0.0–1.3)
MET-HEMOGLOBIN: 0.7 % (ref 0.0–2.0)
MET-HEMOGLOBIN: 0.4 % (ref 0.0–2.0)
O2CT: 13.8 % — ABNORMAL LOW (ref 15.7–24.3)
O2CT: 8.9 % — ABNORMAL LOW (ref 15.7–24.3)
PH (ARTERIAL): 7.34 — ABNORMAL LOW (ref 7.35–7.45)
PH (ARTERIAL): 7.28 — ABNORMAL LOW (ref 7.35–7.45)
WHOLE BLOOD POTASSIUM: 5.3 mmol/L — ABNORMAL HIGH (ref 3.5–4.6)
WHOLE BLOOD POTASSIUM: 5.5 mmol/L — ABNORMAL HIGH (ref 3.5–4.6)

## 2020-05-07 LAB — ABO & RH: ABO/RH(D): O POS

## 2020-05-07 LAB — DRUG SCREEN, NO CONFIRMATION, URINE
AMPHETAMINES, URINE: NEGATIVE
BARBITURATES URINE: NEGATIVE
BENZODIAZEPINES URINE: NEGATIVE
BUPRENORPHINE URINE: NEGATIVE
CANNABINOIDS URINE: NEGATIVE
COCAINE METABOLITES URINE: NEGATIVE
CREATININE RANDOM URINE: 115 mg/dL — ABNORMAL HIGH (ref 50–100)
ECSTASY/MDMA URINE: NEGATIVE
FENTANYL, RANDOM URINE: NEGATIVE
METHADONE URINE: NEGATIVE
OPIATES URINE (LOW CUTOFF): POSITIVE — AB
OXYCODONE URINE: NEGATIVE

## 2020-05-07 LAB — H & H
HCT: 23.8 % — ABNORMAL LOW (ref 34.8–46.0)
HCT: 21.8 % — ABNORMAL LOW (ref 34.8–46.0)
HGB: 7.7 g/dL — ABNORMAL LOW (ref 11.5–16.0)
HGB: 7.3 g/dL — ABNORMAL LOW (ref 11.5–16.0)

## 2020-05-07 LAB — URINALYSIS, MACRO/MICRO
BILIRUBIN: NEGATIVE mg/dL
BLOOD: NEGATIVE mg/dL
COLOR: NORMAL
GLUCOSE: NEGATIVE mg/dL
HYALINE CASTS: 15 /lpf — ABNORMAL HIGH (ref <4.0)
KETONES: NEGATIVE mg/dL
NITRITE: NEGATIVE
PH: 5 (ref 5.0–8.0)
PROTEIN: 30 mg/dL — AB
RBCS: 1 /hpf (ref <6.0)
SPECIFIC GRAVITY: 1.017 (ref 1.005–1.030)
UROBILINOGEN: NEGATIVE mg/dL
WBCS: 40 /hpf — ABNORMAL HIGH (ref <11.0)

## 2020-05-07 LAB — BASIC METABOLIC PANEL
ANION GAP: 12 mmol/L (ref 4–13)
ANION GAP: 22 mmol/L — ABNORMAL HIGH (ref 4–13)
ANION GAP: 13 mmol/L (ref 4–13)
ANION GAP: 11 mmol/L (ref 4–13)
ANION GAP: 13 mmol/L (ref 4–13)
BUN/CREA RATIO: 21 (ref 6–22)
BUN/CREA RATIO: 21 (ref 6–22)
BUN/CREA RATIO: 20 (ref 6–22)
BUN/CREA RATIO: 21 (ref 6–22)
BUN/CREA RATIO: 21 (ref 6–22)
BUN: 50 mg/dL — ABNORMAL HIGH (ref 8–25)
BUN: 37 mg/dL — ABNORMAL HIGH (ref 8–25)
BUN: 52 mg/dL — ABNORMAL HIGH (ref 8–25)
BUN: 50 mg/dL — ABNORMAL HIGH (ref 8–25)
BUN: 51 mg/dL — ABNORMAL HIGH (ref 8–25)
CALCIUM: 8.3 mg/dL — ABNORMAL LOW (ref 8.8–10.2)
CALCIUM: 5.3 mg/dL — CL (ref 8.8–10.2)
CALCIUM: 7.9 mg/dL — ABNORMAL LOW (ref 8.8–10.2)
CALCIUM: 7.5 mg/dL — ABNORMAL LOW (ref 8.8–10.2)
CALCIUM: 7.7 mg/dL — ABNORMAL LOW (ref 8.8–10.2)
CHLORIDE: 108 mmol/L (ref 96–111)
CHLORIDE: 102 mmol/L (ref 96–111)
CHLORIDE: 108 mmol/L (ref 96–111)
CHLORIDE: 109 mmol/L (ref 96–111)
CHLORIDE: 110 mmol/L (ref 96–111)
CO2 TOTAL: 18 mmol/L — ABNORMAL LOW (ref 23–31)
CO2 TOTAL: 12 mmol/L — ABNORMAL LOW (ref 23–31)
CO2 TOTAL: 16 mmol/L — ABNORMAL LOW (ref 23–31)
CO2 TOTAL: 20 mmol/L — ABNORMAL LOW (ref 23–31)
CO2 TOTAL: 18 mmol/L — ABNORMAL LOW (ref 23–31)
CREATININE: 2.34 mg/dL — ABNORMAL HIGH (ref 0.60–1.05)
CREATININE: 1.75 mg/dL — ABNORMAL HIGH (ref 0.60–1.05)
CREATININE: 2.58 mg/dL — ABNORMAL HIGH (ref 0.60–1.05)
CREATININE: 2.37 mg/dL — ABNORMAL HIGH (ref 0.60–1.05)
CREATININE: 2.42 mg/dL — ABNORMAL HIGH (ref 0.60–1.05)
ESTIMATED GFR: 19 mL/min/BSA — ABNORMAL LOW (ref >=60)
ESTIMATED GFR: 27 mL/min/BSA — ABNORMAL LOW (ref >=60)
ESTIMATED GFR: 17 mL/min/BSA — ABNORMAL LOW (ref >=60)
ESTIMATED GFR: 19 mL/min/BSA — ABNORMAL LOW (ref >=60)
ESTIMATED GFR: 18 mL/min/BSA — ABNORMAL LOW (ref >=60)
GLUCOSE: 130 mg/dL — ABNORMAL HIGH (ref 65–125)
GLUCOSE: 101 mg/dL (ref 65–125)
GLUCOSE: 145 mg/dL — ABNORMAL HIGH (ref 65–125)
GLUCOSE: 140 mg/dL — ABNORMAL HIGH (ref 65–125)
GLUCOSE: 152 mg/dL — ABNORMAL HIGH (ref 65–125)
POTASSIUM: 5.7 mmol/L — ABNORMAL HIGH (ref 3.5–5.1)
POTASSIUM: 5.3 mmol/L — ABNORMAL HIGH (ref 3.5–5.1)
POTASSIUM: 5.3 mmol/L — ABNORMAL HIGH (ref 3.5–5.1)
POTASSIUM: 5.3 mmol/L — ABNORMAL HIGH (ref 3.5–5.1)
POTASSIUM: 5.5 mmol/L — ABNORMAL HIGH (ref 3.5–5.1)
SODIUM: 138 mmol/L (ref 136–145)
SODIUM: 136 mmol/L (ref 136–145)
SODIUM: 137 mmol/L (ref 136–145)
SODIUM: 140 mmol/L (ref 136–145)
SODIUM: 141 mmol/L (ref 136–145)

## 2020-05-07 LAB — ARTERIAL BLOOD GAS WITH LACTATE REFLEX
%FIO2 (ARTERIAL): 44 %
BICARBONATE (ARTERIAL): 17.8 mmol/L — ABNORMAL LOW (ref 18.0–26.0)
PAO2/FIO2 RATIO: 514 (ref <=200)
PCO2 (ARTERIAL): 43 mm/Hg (ref 35.0–45.0)
PO2 (ARTERIAL): 226 mm/Hg — ABNORMAL HIGH (ref 72.0–100.0)

## 2020-05-07 LAB — CBC
HCT: 21.9 % — ABNORMAL LOW (ref 34.8–46.0)
HGB: 7 g/dL — ABNORMAL LOW (ref 11.5–16.0)
MCH: 30 pg (ref 26.0–32.0)
MCHC: 32 g/dL (ref 31.0–35.5)
MCV: 94 fL (ref 78.0–100.0)
MPV: 10.7 fL (ref 8.7–12.5)
PLATELETS: 285 10*3/uL (ref 150–400)
RBC: 2.33 10*6/uL — ABNORMAL LOW (ref 3.85–5.22)
RDW-CV: 15 % (ref 11.5–15.5)
WBC: 18 10*3/uL — ABNORMAL HIGH (ref 3.7–11.0)

## 2020-05-07 LAB — VENOUS BLOOD GAS WITH LACTATE REFLEX
%FIO2 (VENOUS): 28 %
BASE DEFICIT: 10.1 mmol/L — ABNORMAL HIGH (ref <=3.0)
BICARBONATE (VENOUS): 16.1 mmol/L — ABNORMAL LOW (ref 22.0–26.0)
PCO2 (VENOUS): 49 mm/Hg (ref 41.00–51.00)
PO2 (VENOUS): 42 mm/Hg (ref 35.0–50.0)

## 2020-05-07 LAB — COVID-19 ~~LOC~~ MOLECULAR LAB TESTING: SARS-CoV-2: NOT DETECTED

## 2020-05-07 LAB — ARTERIAL BLOOD GAS/LACTATE/CO-OX/LYTES (NA/K/CA/CL/GLUC) (TEMP COMP)
%FIO2 (ARTERIAL): 55 %
%FIO2 (ARTERIAL): 50 %
(T) PCO2: 36 mm/Hg (ref 35.0–45.0)
(T) PCO2: 35 mm/Hg (ref 35.0–45.0)
(T) PO2: 267 mm/Hg — ABNORMAL HIGH (ref 72.0–100.0)
(T) PO2: 236 mm/Hg — ABNORMAL HIGH (ref 72.0–100.0)
BICARBONATE (ARTERIAL): 20.8 mmol/L (ref 18.0–26.0)
BICARBONATE (ARTERIAL): 18.3 mmol/L (ref 18.0–26.0)
CHLORIDE: 105 mmol/L (ref 101–111)
CHLORIDE: 107 mmol/L (ref 101–111)
GLUCOSE: 117 mg/dL — ABNORMAL HIGH (ref 60–105)
GLUCOSE: 139 mg/dL — ABNORMAL HIGH (ref 60–105)
HEMATOCRITRT: 29 %
HEMATOCRITRT: 18 %
HEMOGLOBIN: 9.5 g/dL — ABNORMAL LOW (ref 12.0–18.0)
HEMOGLOBIN: 6 g/dL — CL (ref 12.0–18.0)
OXYHEMOGLOBIN: 97.8 % (ref 85.0–98.0)
OXYHEMOGLOBIN: 97.9 % (ref 85.0–98.0)
PAO2/FIO2 RATIO: 491 (ref <=200)
PAO2/FIO2 RATIO: 482 (ref <=200)
PCO2 (ARTERIAL): 37 mm/Hg (ref 35.0–45.0)
PCO2 (ARTERIAL): 37 mm/Hg (ref 35.0–45.0)
PH (T): 7.35 (ref 7.35–7.45)
PH (T): 7.3 — ABNORMAL LOW (ref 7.35–7.45)
PO2 (ARTERIAL): 270 mm/Hg — ABNORMAL HIGH (ref 72.0–100.0)
PO2 (ARTERIAL): 241 mm/Hg — ABNORMAL HIGH (ref 72.0–100.0)
SODIUM: 135 mmol/L — ABNORMAL LOW (ref 137–145)
SODIUM: 136 mmol/L — ABNORMAL LOW (ref 137–145)
TEMPERATURE, COMP: 36.3 C (ref 15.0–40.0)
TEMPERATURE, COMP: 35.9 C (ref 15.0–40.0)

## 2020-05-07 LAB — POTASSIUM
POTASSIUM: 5.4 mmol/L — ABNORMAL HIGH (ref 3.5–5.1)
POTASSIUM: 4.9 mmol/L (ref 3.5–5.1)

## 2020-05-07 LAB — ECG 12-LEAD
Atrial Rate: 70 {beats}/min
Atrial Rate: 70 {beats}/min
Calculated P Axis: 81 degrees
Calculated R Axis: 77 degrees
Calculated R Axis: 68 degrees
Calculated T Axis: 80 degrees
Calculated T Axis: 70 degrees
PR Interval: 202 ms
PR Interval: 250 ms
QRS Duration: 92 ms
QRS Duration: 98 ms
QT Interval: 442 ms
QT Interval: 490 ms
QTC Calculation: 477 ms
QTC Calculation: 529 ms
Ventricular rate: 70 {beats}/min
Ventricular rate: 70 {beats}/min

## 2020-05-07 LAB — TEG, RAPID GLOBAL WITH LYSIS (TRAUMA)
LYS30 (%): 0 % (ref 0.0–2.6)
MA (CRT RAPID): 66.6 mm (ref 52.0–70.0)
MA FIBRINOGEN (CFF): 21.3 mm (ref 15.0–32.0)
R (CK): 4.7 min (ref 4.6–9.1)

## 2020-05-07 LAB — ARTERIAL BLOOD GAS WITH LACTATE REFLEX - INACTIVE
BASE DEFICIT: 9.2 mmol/L — ABNORMAL HIGH (ref 0.0–3.0)
LACTATE: 0.7 mmol/L (ref 0.0–1.3)
PH (ARTERIAL): 7.23 — CL (ref 7.35–7.45)

## 2020-05-07 LAB — MAGNESIUM: MAGNESIUM: 2.5 mg/dL (ref 1.8–2.6)

## 2020-05-07 LAB — VENOUS BLOOD GAS WITH LACTATE REFLEX - INACTIVE
LACTATE: 1.2 mmol/L (ref 0.0–1.3)
PH (VENOUS): 7.18 — CL (ref 7.31–7.41)

## 2020-05-07 LAB — POC BLOOD GLUCOSE (RESULTS)
GLUCOSE, POC: 134 mg/dl — ABNORMAL HIGH (ref 70–105)
GLUCOSE, POC: 164 mg/dl — ABNORMAL HIGH (ref 70–105)
GLUCOSE, POC: 223 mg/dl — ABNORMAL HIGH (ref 70–105)

## 2020-05-07 LAB — TROPONIN-I
TROPONIN I: 22 ng/L (ref 0–30)
TROPONIN I: 43 ng/L — ABNORMAL HIGH (ref 0–30)

## 2020-05-07 LAB — MORPHOLOGY

## 2020-05-07 LAB — VENOUS BLOOD GAS, CO-OX, LYTES, LACTATE REFLEX
%FIO2 (VENOUS): 21 %
BASE DEFICIT: 6.9 mmol/L — ABNORMAL HIGH (ref <=3.0)
BICARBONATE (VENOUS): 19 mmol/L — ABNORMAL LOW (ref 22.0–26.0)
CARBOXYHEMOGLOBIN: 1.8 % (ref 0.0–2.5)
CHLORIDE: 107 mmol/L (ref 101–111)
GLUCOSE: 134 mg/dL — ABNORMAL HIGH (ref 60–105)
HEMOGLOBIN: 8.3 g/dL — ABNORMAL LOW (ref 12.0–18.0)
IONIZED CALCIUM: 1.09 mmol/L
MET-HEMOGLOBIN: 0 %
O2CT: 7.2 %
OXYHEMOGLOBIN: 61.8 %
PCO2 (VENOUS): 44 mm/Hg (ref 41.00–51.00)
PH (VENOUS): 7.26 — ABNORMAL LOW (ref 7.31–7.41)
PO2 (VENOUS): 38 mm/Hg (ref 35.0–50.0)
SODIUM: 137 mmol/L
WHOLE BLOOD POTASSIUM: 5.6 mmol/L — ABNORMAL HIGH (ref 3.5–4.6)

## 2020-05-07 LAB — CREATINE KINASE (CK), TOTAL, SERUM OR PLASMA: CREATINE KINASE: 490 U/L — ABNORMAL HIGH (ref 25–190)

## 2020-05-07 LAB — LACTIC ACID TIMED
LACTIC ACID: 2 mmol/L (ref 0.5–2.2)
LACTIC ACID: 1.3 mmol/L (ref 0.5–2.2)

## 2020-05-07 LAB — IONIZED CALCIUM WITH PH
IONIZED CALCIUM: 1.04 mmol/L — ABNORMAL LOW (ref 1.10–1.35)
PH (VENOUS): 7.27 — ABNORMAL LOW (ref 7.31–7.41)

## 2020-05-07 LAB — PTT (PARTIAL THROMBOPLASTIN TIME): APTT: 25.3 seconds (ref 24.2–37.5)

## 2020-05-07 LAB — VENOUS BLOOD GAS, CO-OX, LYTES, LACTATE REFLEX - INACTIVE: LACTATE: 2.2 mmol/L — ABNORMAL HIGH (ref 0.0–1.3)

## 2020-05-07 LAB — PHOSPHORUS: PHOSPHORUS: 7.2 mg/dL — ABNORMAL HIGH (ref 2.3–4.0)

## 2020-05-07 LAB — LACTIC ACID LEVEL W/ REFLEX FOR LEVEL >2.0: LACTIC ACID: 2.1 mmol/L (ref 0.5–2.2)

## 2020-05-07 LAB — ETHANOL, SERUM: ETHANOL: NOT DETECTED

## 2020-05-07 SURGERY — OPEN REDUCTION INTERNAL FIXATION FRACTURE FEMUR
Anesthesia: General | Site: Leg Upper | Laterality: Right | Wound class: Clean Wound: Uninfected operative wounds in which no inflammation occurred

## 2020-05-07 MED ORDER — TRANEXAMIC ACID 1,000 MG/10 ML (100 MG/ML) INTRAVENOUS SOLUTION
10.0000 mg/kg | Freq: Once | INTRAVENOUS | Status: AC
Start: 2020-05-07 — End: 2020-05-07
  Administered 2020-05-07: 600 mg via INTRAVENOUS
  Filled 2020-05-07: qty 6

## 2020-05-07 MED ORDER — PANTOPRAZOLE 40 MG TABLET,DELAYED RELEASE
40.0000 mg | DELAYED_RELEASE_TABLET | Freq: Every day | ORAL | Status: DC
Start: 2020-05-07 — End: 2020-05-07
  Administered 2020-05-07: 0 mg via ORAL

## 2020-05-07 MED ORDER — ALBUMIN, HUMAN 5 % INTRAVENOUS SOLUTION
INTRAVENOUS | Status: AC
Start: 2020-05-07 — End: 2020-05-07
  Filled 2020-05-07: qty 250

## 2020-05-07 MED ORDER — HYDROMORPHONE 1 MG/ML INJECTION WRAPPER
INJECTION | INTRAMUSCULAR | Status: AC
Start: 2020-05-07 — End: 2020-05-07
  Filled 2020-05-07: qty 1

## 2020-05-07 MED ORDER — ROCURONIUM 10 MG/ML INTRAVENOUS SOLUTION
Freq: Once | INTRAVENOUS | Status: DC | PRN
Start: 2020-05-07 — End: 2020-05-07
  Administered 2020-05-07: 10 mg via INTRAVENOUS
  Administered 2020-05-07: 50 mg via INTRAVENOUS
  Administered 2020-05-07: 10 mg via INTRAVENOUS

## 2020-05-07 MED ORDER — EPHEDRINE SULFATE 5 MG/ML INTRAVENOUS SOLUTION
INTRAVENOUS | Status: AC
Start: 2020-05-07 — End: 2020-05-07
  Filled 2020-05-07: qty 10

## 2020-05-07 MED ORDER — TRANEXAMIC ACID 1,000 MG/10 ML (100 MG/ML) INTRAVENOUS SOLUTION
10.0000 mg/kg/h | INTRAVENOUS | Status: DC
Start: 2020-05-07 — End: 2020-05-07
  Administered 2020-05-07: 10 mg/kg/h via INTRAVENOUS
  Filled 2020-05-07: qty 10

## 2020-05-07 MED ORDER — SODIUM CHLORIDE 0.9 % INTRAVENOUS SOLUTION
INTRAVENOUS | Status: DC
Start: 2020-05-07 — End: 2020-05-07

## 2020-05-07 MED ORDER — ALBUMIN, HUMAN 5 % INTRAVENOUS SOLUTION
INTRAVENOUS | Status: DC | PRN
Start: 2020-05-07 — End: 2020-05-07
  Administered 2020-05-07: 0 via INTRAVENOUS

## 2020-05-07 MED ORDER — DEXAMETHASONE SODIUM PHOSPHATE 4 MG/ML INJECTION SOLUTION
Freq: Once | INTRAMUSCULAR | Status: DC | PRN
Start: 2020-05-07 — End: 2020-05-07
  Administered 2020-05-07: 4 mg via INTRAVENOUS

## 2020-05-07 MED ORDER — SODIUM CHLORIDE 0.9 % INJECTION SOLUTION
2.0000 mL | INTRAVENOUS | Status: DC
Start: 2020-05-07 — End: 2020-05-12

## 2020-05-07 MED ORDER — SODIUM CHLORIDE 0.9 % IV BOLUS
40.0000 mL | INJECTION | Freq: Once | Status: AC | PRN
Start: 2020-05-07 — End: 2020-05-08

## 2020-05-07 MED ORDER — CALCIUM 200 MG (AS CALCIUM CARBONATE 500 MG) CHEWABLE TABLET
500.0000 mg | CHEWABLE_TABLET | Freq: Three times a day (TID) | ORAL | Status: DC
Start: 2020-05-07 — End: 2020-05-07

## 2020-05-07 MED ORDER — SODIUM CHLORIDE 0.9 % IRRIGATION SOLUTION
1000.0000 mL | Status: DC | PRN
Start: 2020-05-07 — End: 2020-05-07

## 2020-05-07 MED ORDER — CALCIUM GLUCONATE 100 MG/ML (10 %) INTRAVENOUS SOLUTION
1000.0000 mg | Freq: Once | INTRAVENOUS | Status: AC
Start: 2020-05-07 — End: 2020-05-07
  Administered 2020-05-07: 1000 mg via INTRAVENOUS
  Filled 2020-05-07: qty 10

## 2020-05-07 MED ORDER — METOPROLOL TARTRATE 25 MG TABLET
25.0000 mg | ORAL_TABLET | Freq: Every day | ORAL | Status: DC
Start: 2020-05-07 — End: 2020-05-12
  Administered 2020-05-07 – 2020-05-09 (×3): 25 mg via ORAL
  Administered 2020-05-09: 0 mg via ORAL
  Administered 2020-05-10 – 2020-05-12 (×3): 25 mg via ORAL
  Filled 2020-05-07 (×6): qty 1

## 2020-05-07 MED ORDER — SODIUM CHLORIDE 0.9 % (FLUSH) INJECTION SYRINGE
2.0000 mL | INJECTION | INTRAMUSCULAR | Status: DC | PRN
Start: 2020-05-07 — End: 2020-05-07

## 2020-05-07 MED ORDER — SODIUM CHLORIDE 0.9 % (FLUSH) INJECTION SYRINGE
2.0000 mL | INJECTION | Freq: Three times a day (TID) | INTRAMUSCULAR | Status: DC
Start: 2020-05-07 — End: 2020-05-07
  Administered 2020-05-07: 06:00:00 0 mL

## 2020-05-07 MED ORDER — ELECTROLYTE-A INTRAVENOUS SOLUTION
INTRAVENOUS | Status: DC
Start: 2020-05-07 — End: 2020-05-07

## 2020-05-07 MED ORDER — SENNA LEAF EXTRACT 176 MG/5 ML ORAL SYRUP
10.0000 mL | ORAL_SOLUTION | Freq: Two times a day (BID) | ORAL | Status: DC
Start: 2020-05-07 — End: 2020-05-07

## 2020-05-07 MED ORDER — LACTATED RINGERS INTRAVENOUS SOLUTION
INTRAVENOUS | Status: DC
Start: 2020-05-07 — End: 2020-05-07

## 2020-05-07 MED ORDER — HEPARIN (PORCINE) 5,000 UNIT/ML INJECTION SOLUTION
5000.0000 [IU] | Freq: Three times a day (TID) | INTRAMUSCULAR | Status: DC
Start: 2020-05-07 — End: 2020-05-07

## 2020-05-07 MED ORDER — ONDANSETRON HCL (PF) 4 MG/2 ML INJECTION SOLUTION
4.0000 mg | Freq: Three times a day (TID) | INTRAMUSCULAR | Status: DC | PRN
Start: 2020-05-07 — End: 2020-05-12
  Administered 2020-05-07 – 2020-05-12 (×9): 4 mg via INTRAVENOUS
  Filled 2020-05-07 (×9): qty 2

## 2020-05-07 MED ORDER — INSULIN U-100 REGULAR HUMAN 100 UNIT/ML INJECTION SOLUTION
10.0000 [IU] | INTRAMUSCULAR | Status: AC
Start: 2020-05-07 — End: 2020-05-07
  Administered 2020-05-07: 10 [IU] via INTRAVENOUS
  Filled 2020-05-07: qty 3

## 2020-05-07 MED ORDER — FENTANYL (PF) 50 MCG/ML INJECTION SOLUTION
Freq: Once | INTRAMUSCULAR | Status: DC | PRN
Start: 2020-05-07 — End: 2020-05-07
  Administered 2020-05-07: 100 ug via INTRAVENOUS

## 2020-05-07 MED ORDER — SODIUM CHLORIDE 0.9 % (FLUSH) INJECTION SYRINGE
2.0000 mL | INJECTION | INTRAMUSCULAR | Status: DC | PRN
Start: 2020-05-07 — End: 2020-05-08

## 2020-05-07 MED ORDER — LABETALOL 20 MG/4 ML (5 MG/ML) INTRAVENOUS SYRINGE
INJECTION | Freq: Once | INTRAVENOUS | Status: DC | PRN
Start: 2020-05-07 — End: 2020-05-07
  Administered 2020-05-07: 2.5 mg via INTRAVENOUS

## 2020-05-07 MED ORDER — CALCIUM CHLORIDE 100 MG/ML (10 %) INTRAVENOUS SYRINGE
INJECTION | Freq: Once | INTRAVENOUS | Status: DC | PRN
Start: 2020-05-07 — End: 2020-05-07
  Administered 2020-05-07 (×2): 100 mg via INTRAVENOUS
  Administered 2020-05-07: 200 mg via INTRAVENOUS
  Administered 2020-05-07: 100 mg via INTRAVENOUS

## 2020-05-07 MED ORDER — FENTANYL (PF) 50 MCG/ML INJECTION SOLUTION
INTRAMUSCULAR | Status: AC
Start: 2020-05-07 — End: 2020-05-07
  Filled 2020-05-07: qty 2

## 2020-05-07 MED ORDER — LABETALOL 20 MG/4 ML (5 MG/ML) INTRAVENOUS SYRINGE
INJECTION | INTRAVENOUS | Status: AC
Start: 2020-05-07 — End: 2020-05-07
  Filled 2020-05-07: qty 4

## 2020-05-07 MED ORDER — PHENYLEPHRINE 1 MG/10 ML (100 MCG/ML) IN 0.9 % SOD.CHLORIDE IV SYRINGE
INJECTION | Freq: Once | INTRAVENOUS | Status: DC | PRN
Start: 2020-05-07 — End: 2020-05-07
  Administered 2020-05-07: 50 ug via INTRAVENOUS
  Administered 2020-05-07: 100 ug via INTRAVENOUS

## 2020-05-07 MED ORDER — SODIUM CHLORIDE 0.9 % INTRAVENOUS SOLUTION
INTRAVENOUS | Status: DC | PRN
Start: 2020-05-07 — End: 2020-05-07
  Administered 2020-05-07: 0 via INTRAVENOUS

## 2020-05-07 MED ORDER — ATORVASTATIN 10 MG TABLET
20.0000 mg | ORAL_TABLET | Freq: Every day | ORAL | Status: DC
Start: 2020-05-07 — End: 2020-05-12
  Administered 2020-05-07 – 2020-05-08 (×2): 20 mg via ORAL
  Administered 2020-05-09: 0 mg via ORAL
  Administered 2020-05-10 – 2020-05-12 (×3): 20 mg via ORAL
  Filled 2020-05-07 (×6): qty 2

## 2020-05-07 MED ORDER — ELECTROLYTE-A INTRAVENOUS SOLUTION
INTRAVENOUS | Status: DC | PRN
Start: 2020-05-07 — End: 2020-05-07

## 2020-05-07 MED ORDER — GABAPENTIN 100 MG CAPSULE
100.0000 mg | ORAL_CAPSULE | Freq: Three times a day (TID) | ORAL | Status: DC | PRN
Start: 2020-05-07 — End: 2020-05-12
  Administered 2020-05-08: 100 mg via ORAL
  Filled 2020-05-07: qty 1

## 2020-05-07 MED ORDER — PANTOPRAZOLE 40 MG TABLET,DELAYED RELEASE
40.0000 mg | DELAYED_RELEASE_TABLET | Freq: Once | ORAL | Status: AC
Start: 2020-05-07 — End: 2020-05-07
  Administered 2020-05-07: 40 mg via ORAL
  Filled 2020-05-07: qty 1

## 2020-05-07 MED ORDER — SENNOSIDES 8.6 MG-DOCUSATE SODIUM 50 MG TABLET
1.0000 | ORAL_TABLET | Freq: Two times a day (BID) | ORAL | Status: DC
Start: 2020-05-07 — End: 2020-05-12
  Administered 2020-05-07: 1 via ORAL
  Administered 2020-05-07: 0 via ORAL
  Administered 2020-05-08: 1 via ORAL
  Administered 2020-05-08 – 2020-05-09 (×2): 0 via ORAL
  Administered 2020-05-09 – 2020-05-10 (×3): 1 via ORAL
  Administered 2020-05-11 (×2): 0 via ORAL
  Administered 2020-05-12: 1 via ORAL
  Filled 2020-05-07 (×7): qty 1

## 2020-05-07 MED ORDER — BUPIVACAINE (PF) 0.25 % (2.5 MG/ML) INJECTION SOLUTION
20.0000 mL | Freq: Once | INTRAMUSCULAR | Status: DC | PRN
Start: 2020-05-07 — End: 2020-05-07
  Administered 2020-05-07: 50 mL

## 2020-05-07 MED ORDER — DEXTROSE 5 % IN WATER (D5W) INTRAVENOUS SOLUTION
2.0000 g | Freq: Once | INTRAVENOUS | Status: AC
Start: 2020-05-07 — End: 2020-05-07
  Administered 2020-05-07: 2 g via INTRAVENOUS
  Filled 2020-05-07: qty 20

## 2020-05-07 MED ORDER — PHENYLEPHRINE 10 MG IN NS 100 ML INFUSION - FOR ANES
INTRAVENOUS | Status: DC | PRN
Start: 2020-05-07 — End: 2020-05-07
  Administered 2020-05-07: .5 ug/kg/min via INTRAVENOUS
  Administered 2020-05-07: .2 ug/kg/min via INTRAVENOUS
  Administered 2020-05-07: .3 ug/kg/min via INTRAVENOUS
  Administered 2020-05-07: 0 ug/kg/min via INTRAVENOUS
  Administered 2020-05-07: .5 ug/kg/min via INTRAVENOUS
  Administered 2020-05-07: .3 ug/kg/min via INTRAVENOUS
  Administered 2020-05-07: 0 ug/kg/min via INTRAVENOUS
  Administered 2020-05-07: .5 ug/kg/min via INTRAVENOUS

## 2020-05-07 MED ORDER — AMIODARONE 200 MG TABLET
200.0000 mg | ORAL_TABLET | Freq: Every day | ORAL | Status: DC
Start: 2020-05-07 — End: 2020-05-12
  Administered 2020-05-07 – 2020-05-08 (×2): 200 mg via ORAL
  Administered 2020-05-09: 0 mg via ORAL
  Administered 2020-05-10 – 2020-05-12 (×3): 200 mg via ORAL
  Filled 2020-05-07 (×5): qty 1

## 2020-05-07 MED ORDER — ELECTROLYTE-A INTRAVENOUS SOLUTION BOLUS
500.0000 mL | Freq: Once | INTRAVENOUS | Status: AC
Start: 2020-05-07 — End: 2020-05-07
  Administered 2020-05-07: 07:00:00 0 mL via INTRAVENOUS
  Administered 2020-05-07: 500 mL via INTRAVENOUS

## 2020-05-07 MED ORDER — DEXTROSE 5 % IN WATER (D5W) INTRAVENOUS SOLUTION
2.0000 g | INTRAVENOUS | Status: AC
Start: 2020-05-08 — End: 2020-05-08
  Administered 2020-05-08: 0 g via INTRAVENOUS
  Administered 2020-05-08: 11:00:00 2 g via INTRAVENOUS
  Filled 2020-05-07: qty 20

## 2020-05-07 MED ORDER — PROPOFOL 10 MG/ML IV BOLUS
INJECTION | Freq: Once | INTRAVENOUS | Status: DC | PRN
Start: 2020-05-07 — End: 2020-05-07
  Administered 2020-05-07: 60 mg via INTRAVENOUS

## 2020-05-07 MED ORDER — SODIUM CHLORIDE 0.9 % (FLUSH) INJECTION SYRINGE
2.0000 mL | INJECTION | Freq: Three times a day (TID) | INTRAMUSCULAR | Status: DC
Start: 2020-05-07 — End: 2020-05-07
  Administered 2020-05-07: 0 mL

## 2020-05-07 MED ORDER — DEXTROSE 5 % IN WATER (D5W) INTRAVENOUS SOLUTION
2.0000 g | Freq: Three times a day (TID) | INTRAVENOUS | Status: DC
Start: 2020-05-07 — End: 2020-05-07
  Administered 2020-05-07: 0 g via INTRAVENOUS

## 2020-05-07 MED ORDER — LIDOCAINE (PF) 100 MG/5 ML (2 %) INTRAVENOUS SYRINGE
INJECTION | Freq: Once | INTRAVENOUS | Status: DC | PRN
Start: 2020-05-07 — End: 2020-05-07
  Administered 2020-05-07: 60 mg via INTRAVENOUS

## 2020-05-07 MED ORDER — ALBUMIN, HUMAN 5 % INTRAVENOUS SOLUTION
25.0000 g | Freq: Once | INTRAVENOUS | Status: AC
Start: 2020-05-07 — End: 2020-05-07
  Administered 2020-05-07: 25 g via INTRAVENOUS
  Administered 2020-05-07: 0 g via INTRAVENOUS

## 2020-05-07 MED ORDER — BACITRACIN ZINC 500 UNIT/GRAM TOPICAL OINTMENT IN PACKET
1.0000 | TOPICAL_OINTMENT | Freq: Two times a day (BID) | CUTANEOUS | Status: DC | PRN
Start: 2020-05-07 — End: 2020-05-12
  Filled 2020-05-07: qty 1

## 2020-05-07 MED ORDER — NITROGLYCERIN 0.4 MG SUBLINGUAL TABLET
0.4000 mg | SUBLINGUAL_TABLET | SUBLINGUAL | Status: DC | PRN
Start: 2020-05-07 — End: 2020-05-12

## 2020-05-07 MED ORDER — NIFEDIPINE ER 30 MG TABLET,EXTENDED RELEASE 24 HR
30.0000 mg | EXTENDED_RELEASE_TABLET | Freq: Two times a day (BID) | ORAL | Status: DC
Start: 2020-05-07 — End: 2020-05-08
  Administered 2020-05-07 (×2): 0 mg via ORAL
  Filled 2020-05-07 (×2): qty 1

## 2020-05-07 MED ORDER — DEXTROSE 50 % IN WATER (D50W) INTRAVENOUS SYRINGE
25.0000 g | INJECTION | INTRAVENOUS | Status: AC
Start: 2020-05-07 — End: 2020-05-07
  Administered 2020-05-07: 50 mL via INTRAVENOUS
  Filled 2020-05-07: qty 50

## 2020-05-07 MED ORDER — GLYCOPYRROLATE 0.2 MG/ML INJECTION SOLUTION
Freq: Once | INTRAMUSCULAR | Status: DC | PRN
Start: 2020-05-07 — End: 2020-05-07
  Administered 2020-05-07: .5 mg via INTRAVENOUS

## 2020-05-07 MED ORDER — PANTOPRAZOLE 40 MG TABLET,DELAYED RELEASE
40.0000 mg | DELAYED_RELEASE_TABLET | Freq: Every day | ORAL | Status: DC
Start: 2020-05-08 — End: 2020-05-12
  Administered 2020-05-08: 0 mg via ORAL
  Administered 2020-05-09 – 2020-05-11 (×3): 40 mg via ORAL
  Filled 2020-05-07 (×5): qty 1

## 2020-05-07 MED ORDER — NEOSTIGMINE METHYLSULFATE 5 MG/5 ML (1 MG/ML) INTRAVENOUS SYRINGE
INJECTION | Freq: Once | INTRAVENOUS | Status: DC | PRN
Start: 2020-05-07 — End: 2020-05-07
  Administered 2020-05-07: 3 mg via INTRAVENOUS

## 2020-05-07 MED ORDER — SODIUM CHLORIDE 0.9 % IV BOLUS
40.0000 mL | INJECTION | Freq: Once | Status: AC | PRN
Start: 2020-05-07 — End: 2020-05-07

## 2020-05-07 MED ORDER — FAMOTIDINE (PF) 20 MG/2 ML INTRAVENOUS SOLUTION
10.0000 mg | Freq: Two times a day (BID) | INTRAVENOUS | Status: DC
Start: 2020-05-07 — End: 2020-05-07

## 2020-05-07 MED ORDER — ELECTROLYTE-A INTRAVENOUS SOLUTION
INTRAVENOUS | Status: DC
Start: 2020-05-07 — End: 2020-05-08
  Administered 2020-05-08: 0 mL via INTRAVENOUS

## 2020-05-07 MED ORDER — DEXAMETHASONE SODIUM PHOSPHATE 4 MG/ML INJECTION SOLUTION
4.0000 mg | INTRAMUSCULAR | Status: DC
Start: 2020-05-07 — End: 2020-05-10
  Filled 2020-05-07 (×2): qty 1

## 2020-05-07 MED ORDER — PROCHLORPERAZINE EDISYLATE 10 MG/2 ML (5 MG/ML) INJECTION SOLUTION
10.0000 mg | Freq: Four times a day (QID) | INTRAMUSCULAR | Status: DC | PRN
Start: 2020-05-07 — End: 2020-05-12
  Filled 2020-05-07: qty 2

## 2020-05-07 MED ORDER — OXYCODONE 5 MG TABLET
5.0000 mg | ORAL_TABLET | ORAL | Status: DC | PRN
Start: 2020-05-07 — End: 2020-05-12
  Administered 2020-05-10 – 2020-05-12 (×6): 5 mg via ORAL
  Filled 2020-05-07 (×6): qty 1

## 2020-05-07 MED ORDER — ENOXAPARIN 30 MG/0.3 ML SUBCUTANEOUS SYRINGE
30.0000 mg | INJECTION | SUBCUTANEOUS | Status: DC
Start: 2020-05-08 — End: 2020-05-08

## 2020-05-07 MED ORDER — HYDROMORPHONE 1 MG/ML INJECTION WRAPPER
0.2000 mg | INJECTION | INTRAMUSCULAR | Status: DC | PRN
Start: 2020-05-07 — End: 2020-05-07

## 2020-05-07 MED ORDER — CALCIUM 200 MG (AS CALCIUM CARBONATE 500 MG) CHEWABLE TABLET
500.0000 mg | CHEWABLE_TABLET | Freq: Three times a day (TID) | ORAL | Status: DC
Start: 2020-05-07 — End: 2020-05-12
  Administered 2020-05-07 – 2020-05-08 (×2): 500 mg via ORAL
  Administered 2020-05-08: 0 mg via ORAL
  Administered 2020-05-08: 500 mg via ORAL
  Administered 2020-05-09: 0 mg via ORAL
  Administered 2020-05-09: 500 mg via ORAL
  Administered 2020-05-09: 0 mg via ORAL
  Administered 2020-05-10 – 2020-05-12 (×7): 500 mg via ORAL
  Filled 2020-05-07 (×11): qty 1

## 2020-05-07 MED ORDER — OXYCODONE 5 MG TABLET
10.0000 mg | ORAL_TABLET | ORAL | Status: DC | PRN
Start: 2020-05-07 — End: 2020-05-10
  Administered 2020-05-07 – 2020-05-08 (×2): 10 mg via ORAL
  Filled 2020-05-07 (×3): qty 2

## 2020-05-07 MED ORDER — HYDROMORPHONE 1 MG/ML INJECTION WRAPPER
INJECTION | Freq: Once | INTRAMUSCULAR | Status: DC | PRN
Start: 2020-05-07 — End: 2020-05-07
  Administered 2020-05-07 (×5): .2 mg via INTRAVENOUS

## 2020-05-07 MED ORDER — ONDANSETRON HCL (PF) 4 MG/2 ML INJECTION SOLUTION
Freq: Once | INTRAMUSCULAR | Status: DC | PRN
Start: 2020-05-07 — End: 2020-05-07
  Administered 2020-05-07: 4 mg via INTRAVENOUS

## 2020-05-07 MED ORDER — SODIUM CHLORIDE 0.9 % (FLUSH) INJECTION SYRINGE
2.0000 mL | INJECTION | Freq: Three times a day (TID) | INTRAMUSCULAR | Status: DC
Start: 2020-05-07 — End: 2020-05-08
  Administered 2020-05-07: 2 mL
  Administered 2020-05-07: 0 mL
  Administered 2020-05-08: 05:00:00 2 mL

## 2020-05-07 MED ORDER — INSULIN LISPRO 100 UNIT/ML SUB-Q SSIP
0.0000 [IU] | INJECTION | SUBCUTANEOUS | Status: DC | PRN
Start: 2020-05-07 — End: 2020-05-12
  Administered 2020-05-07 – 2020-05-12 (×2): 2 [IU] via SUBCUTANEOUS
  Filled 2020-05-07: qty 300

## 2020-05-07 MED ORDER — ACETAMINOPHEN 325 MG TABLET
650.0000 mg | ORAL_TABLET | ORAL | Status: DC
Start: 2020-05-07 — End: 2020-05-10
  Administered 2020-05-07 (×2): 650 mg via ORAL
  Administered 2020-05-08: 0 mg via ORAL
  Administered 2020-05-08 – 2020-05-09 (×6): 650 mg via ORAL
  Administered 2020-05-09 (×4): 0 mg via ORAL
  Administered 2020-05-09: 650 mg via ORAL
  Administered 2020-05-10: 0 mg via ORAL
  Administered 2020-05-10: 01:00:00 650 mg via ORAL
  Filled 2020-05-07 (×11): qty 2

## 2020-05-07 SURGICAL SUPPLY — 53 items
APPL 70% ISPRP 2% CHG 26ML 13._2X13.2IN CHLRPRP PREP DEHP-FR (WOUND CARE/ENTEROSTOMAL SUPPLY) ×2
APPL 70% ISPRP 2% CHG 26ML CHLRPRP HI-LT ORNG PREP STRL LF  DISP CLR (WOUND CARE SUPPLY) ×2 IMPLANT
BIT DRILL 300MM 4.3MM PERC QUI CK COUPLE CALIBRATE NONST (CUTTING ELEMENTS) ×1
BIT DRILL 300MM 4.3MM PERC QUICK COUPLE CALIBRATE NONST (SURGICAL CUTTING SUPPLIES) ×1 IMPLANT
BLANKET MISTRAL-AIR ADULT UPR BODY 79X29.9IN FRC AIR HI VOL BLWR INTUITIVE CONTROL PNL LRG LED (MISCELLANEOUS PT CARE ITEMS) ×1 IMPLANT
BLANKET MISTRAL-AIR UPR BODY 7 8.7X29.9IN FRC AIR WARM (MISCELLANEOUS PT CARE ITEMS) ×1
BRACE ORTHO KNEE BASIC DRNG QLOK BLK (ORTHOPEDICS (NOT IMPLANTS)) ×1 IMPLANT
BRACE POST OP KNEE REG BLEDSOE AK561005B QUICK LOCK (ORTHOPEDICS (NOT IMPLANTS)) ×1
CONV USE 328225 - CUFF TOURNIQUET 8X2.75IN ATS CYL 2 PORT 1 BLADDER SLEEVE STRL LF  DISP (ORTHOPEDICS (NOT IMPLANTS)) IMPLANT
CONV USE 405184 - CUFF TOURNIQUET BLK 12X3.5IN ATS 3K CYL 2 PORT BLADDER POS LOCK CONN SLEEVE STRL LF  DISP (ORTHOPEDICS (NOT IMPLANTS)) IMPLANT
CONV USE 405185 - CUFF TOURNIQUET 18X4IN ATS CYL 2 PORT 1 BLADDER SLEEVE STRL LF  DISP (ORTHOPEDICS (NOT IMPLANTS)) IMPLANT
CONV USE 405186 - CUFF TOURNIQUET GRN 24X4IN ATS 3K CYL 2 PORT 1 BLADDER POS LOCK CONN SLEEVE STRL LF  DISP (ORTHOPEDICS (NOT IMPLANTS)) IMPLANT
CONV USE ITEM 313924 - BANDAGE COFLX NL 5YDX6IN_EASYTEAR ADH CHSV PAT TCH STRN (WOUND CARE SUPPLY) ×1 IMPLANT
CONV USE ITEM 337890 - PACK SURG BSIN 2 STRL LF  DISP (CUSTOM TRAYS & PACK) ×1 IMPLANT
CONV USE ITEM 338659 - PACK SURG CUSTOM GN ORTHO NONST DISP LF (CUSTOM TRAYS & PACK) ×1 IMPLANT
COVER WAND RFD STRL 50EA/CS_01-0020 (EQUIPMENT MINOR) ×2
COVER WND RF DETECT STRL CLR EQP (EQUIPMENT MINOR) ×1 IMPLANT
CUFF TOURNIQUET 18X4IN ATS CYL 2 PORT 1 BLADDER SLEEVE STRL LF  DISP (ORTHOPEDICS (NOT IMPLANTS))
CUFF TOURNIQUET 8IN X 2.75IN DISP 60707015100 10/BX (ORTHOPEDICS (NOT IMPLANTS))
CUFF TOURNIQUET 8X2.75IN ATS CYL 2 PORT 1 BLADDER SLEEVE STRL LF  DISP (ORTHOPEDICS (NOT IMPLANTS))
CUFF TOURNIQUET BLK 12X3.5IN ATS 3K CYL 2 PORT BLADDER POS LOCK CONN SLEEVE STRL LF  DISP (ORTHOPEDICS (NOT IMPLANTS))
CUFF TOURNIQUET GRN 24X4IN ATS 3K CYL 2 PORT 1 BLADDER POS LOCK CONN SLEEVE STRL LF  DISP (ORTHOPEDICS (NOT IMPLANTS))
CUFF TOURNIQUET GRN 24X4IN ATS_CYL 1 PORT 1 BLADDER POS LOCK (ORTHOPEDICS (NOT IMPLANTS))
CUFF TOURNIQUET RD 18X4IN ATS CYL 1 PORT 1 BLADDER LL CONN (ORTHOPEDICS (NOT IMPLANTS))
CUFF TOURNIQUET RUBY 42X4IN ATS 2 PORT 1 BLADDER SLEEVE POS LOCK CONN STRL LF  DISP (ORTHOPEDICS (NOT IMPLANTS)) IMPLANT
DRAPE 2 INCS FILM ANTIMIC 33X23IN IOBN STRL SURG (PROTECTIVE PRODUCTS/GARMENTS) ×1 IMPLANT
DRAPE CARM FLRSCP EXPD CLPSBL C-ARMOR STRL EQP (DRAPE/PACKS/SHEETS/OR TOWEL) ×1 IMPLANT
DRESS PETRO 8X3IN CURAD GAUZE NADH NONOCCLUSIVE IMPREGNATE KNIT LF  STRL WHT (WOUND CARE SUPPLY) IMPLANT
GW ORTHO 2.5MM 200MM LCP DRILL TIP SS TOMOFX OSTEO SYS NONST (ORTHOPEDICS (NOT IMPLANTS)) ×1 IMPLANT
GW ORTHO 2.5MM 200MM LCP DRILL_TIP SS TOMOFX OSTEO SYS NS (ORTHOPEDICS (NOT IMPLANTS)) ×2
HANDLE RIGID PLASTIC STRL LF  DISP DVN EZ HNDL SURG LIGHT (INSTRUMENTS) ×1 IMPLANT
HANDLE RIGID PLASTIC STRL LF DISP DVN EZ HNDL SURG LIGHT (INSTRUMENTS) ×1
KIT DRAIN 10FR PVC 3 SPRG RESERVOIR TROCAR Y CONN JP 1/8IN RND 400ML STRL LF  DISP (WOUND CARE SUPPLY) ×1 IMPLANT
MBO USE ITEM 317672 - HANDLE RIGID PLASTIC STRL LF  DISP DVN EZ HNDL SURG LIGHT (SURGICAL INSTRUMENTS) ×1 IMPLANT
PACK SURG CSTM GN ORTHO NONST DISP LF (CUSTOM TRAYS & PACK) ×1
PLATE LCP CMBN 336MM 16 H 4 CLMN THREAD VAR ANG SS CONDYLAR PERIART RGT CURVE BONE NONST 4.5MM SCREW ×1 IMPLANT
SCREW BONE LCP 5MM 10MM SLF TAP LOCK VAR ANG STRDR SS T25 NONST CURVE CONDYLAR PLATE SYS ×2 IMPLANT
SCREW BONE LCP 5MM 14MM SLF TAP LOCK VAR ANG STRDR SS T25 NONST CURVE CONDYLAR PLATE SYS ×2 IMPLANT
SCREW BONE LCP 5MM 16MM SLF TAP LOCK VAR ANG STRDR SS T25 NONST CURVE CONDYLAR PLATE SYS ×1 IMPLANT
SCREW BONE LCP 5MM 26MM SLF TAP LOCK VAR ANG STRDR SS T25 NONST CURVE CONDYLAR PLATE SYS ×1 IMPLANT
SCREW BONE LCP 5MM 40MM SLF TAP LOCK VAR ANG STRDR SS T25 NONST CURVE CONDYLAR PLATE SYS ×2 IMPLANT
SCREW BONE LCP 5MM 65MM SLF TAP LOCK VAR ANG STRDR SS T25 NONST CURVE CONDYLAR PLATE SYS ×1 IMPLANT
SCREW BONE LCP 5MM 70MM SLF TAP LOCK VAR ANG STRDR SS T25 NONST CURVE CONDYLAR PLATE SYS ×1 IMPLANT
SCREW BONE LCP 5MM 75MM SLF TAP LOCK VAR ANG STRDR SS T25 NONST CURVE CONDYLAR PLATE SYS ×1 IMPLANT
SCREW BONE LCP 5MM 80MM CANN SLF DRILL LOCK VAR ANG SS 3.5MM FLUTE NONST CURVE CONDYLAR PLATE SYS ×1 IMPLANT
SCREW BONE LCP 5MM 85MM SLF TAP LOCK VAR ANG STRDR SS T25 NONST CURVE CONDYLAR PLATE SYS ×1 IMPLANT
SET EXT STR 4ML 29IN IV MALE L L ADPR STD BORE RETRACT COL (IV Tubing & Accessories) ×1
SET EXT STR 4ML 29IN IV MALE LL ADPR STD BORE RETRACT COL STRL LF  DEHP (IV TUBING & ACCESSORIES) ×1 IMPLANT
STAPLER SKIN 4.1X6.5MM 35 W STPL CART LF  APS U DISP CLR SS PLASTIC (ENDOSCOPIC SUPPLIES) ×1 IMPLANT
SUTURE 2-0 FS PROLENE 18IN BLU MONOF NONAB (SUTURE/WOUND CLOSURE) IMPLANT
SYRINGE LL 50ML LF  STRL GRAD N-PYRG DEHP-FR PVC FREE MED DISP CLR (NEEDLES & SYRINGE SUPPLIES) ×1 IMPLANT
SYRINGE LL 50ML LF STRL GRAD N-PYRG DEHP-FR PVC FREE MED (NEEDLES & SYRINGE SUPPLIES) ×1
TRAY SKIN SCRUB 8IN VNYL COTTON 6 WNG 6 SPONGE STICK 2 TIP APPL DRY STRL LF (KITS & TRAYS (DISPOSABLE)) IMPLANT

## 2020-05-07 NOTE — Procedures (Signed)
Reason for Interrogation: Pre - procedure    Device: ST. Jude      Model #:    [employer] Assurity MRI [id number]     Type: Placed for sick sinus syndrome  Dual Chamber    Followed by ?Marland Kitchen   Implanted: Margo Common    A lead st jude tendril sdx [id number] 52 cm    LV lead st jude med passive plus DX [id number] 58cm    Battery remaining = 9 years.  Mode = DDD @70  bpm.  Underlying rhythm: NSR    A: sensing = 2.77mv, threshold = 0.75 V @ 0.5 ms,  450 ohms.   11m = >12.62mv, threshold = 0.5 V @ 0.5 ms,   690 ohms.     Ap VS = 95% ,   AP-Vp = 4%.    AT/AF burden =  <1%.       No changes made today.  Normal device function.  Patient doesn't seem to be pacer dependent however given her high paced rate(@70bpm ) she is 95% atrial paced.   on lowering rate, she seems to have intrinsic rhythm  round 50-60bpm     1m, MD    PACEMAKER OR DEFIBRILLATOR EVALUATION (INTERROGATION EVALUATION OR PROGRAMMING EVALUATION), OR ANALYSIS OF REMOTE MONITORING, AS APPLICABLE    Order:    See patient's medical record.    Procedure Description:    Standard programming evaluation (or interrogation evaluation) using device programmer, or standard analysis of remote monitoring, as appropriate.    Data recording:    See printouts, where available, for details.    Physician Analysis and Reporting:    Unremarkable device analysis. No major abnormalities, except as otherwise noted, if applicable.      Plan:    Plan routine follow-up, as appropriate, or unless otherwise indicated.  Please see notes and/or scanned printouts (where available) for details.      Shron Ozer B. Blenda Peals, M.D.  Office: [address]  Cell: [phone]

## 2020-05-07 NOTE — Anesthesia Postprocedure Evaluation (Signed)
Anesthesia Post Op Evaluation    Patient: Theresa Greene  Procedure(s) with comments:  OPEN REDUCTION INTERNAL FIXATION FRACTURE FEMUR - peri-prostetic    Last Vitals:Temperature: 36.3 C (97.3 F) (05/07/20 1630)  Heart Rate: 70 (05/07/20 1630)  BP (Non-Invasive): (!) 121/57 (05/07/20 1630)  Respiratory Rate: 20 (05/07/20 1630)  SpO2: 100 % (05/07/20 1630)    No complications documented.    Patient is sufficiently recovered from the effects of anesthesia to participate in the evaluation and has returned to their pre-procedure level.  Patient location during evaluation: PACU       Patient participation: complete - patient participated  Level of consciousness: responsive to verbal stimuli and sleepy but conscious    Pain management: adequate  Airway patency: patent    Anesthetic complications: no  Cardiovascular status: acceptable  Respiratory status: acceptable  Hydration status: acceptable  Patient post-procedure temperature: Pt Normothermic   PONV Status: Absent

## 2020-05-07 NOTE — Nurses Notes (Signed)
Txt paged Theresa Greene, "Theresa Greene Patient requesting daily dose of Dexilant (home med), also can you d/c extra MIVF order thanks!"  Mittie Bodo, RN  05/07/2020, 10:45

## 2020-05-07 NOTE — Nurses Notes (Addendum)
Patient admitted to 778 from ED. Patient oriented to room and call light system.  Instructed to call for assistance when needed.  Verbalized understanding.  VS per flowsheet.  Full assessment per flowsheet. Night shift nurse, Kerin Salen, paged TRB service regarding patient arriving from ED not in C-Spine precautions or with an Aspen collar on.  Per orders patient is to maintain Aspen at tall times and HOB in reverse trendelenburg.  Foreman to bedside and cleared collar.  New orders placed at this time. Trauma bath / Pre-op bath completed at this time with chlorhexidine.  Mittie Bodo, RN  05/07/2020, 11:01
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
## 2020-05-07 NOTE — OR Surgeon (Addendum)
PATIENT NAME: Theresa Greene, Theresa Greene [facility] NUMBER:  [id number]  DATE OF SERVICE: 05/07/2020  DATE OF BIRTH:  August 13, 1935    OPERATIVE REPORT    PREOPERATIVE DIAGNOSIS:  Intraprosthetic right distal femoral shaft fracture with osteoporosis.    POSTOPERATIVE DIAGNOSIS:  Intraprosthetic right distal femoral shaft fracture with osteoporosis.    NAME OF PROCEDURE:  Open reduction, internal fixation, intraprosthetic distal femur fracture.    SURGEON:  Jeanell Sparrow, MD.    ASSISTANTS:  1. Keitha Butte, MD.  2. Joretta Bachelor, MD.    ANESTHESIA:  General endotracheal.    INDICATIONS FOR PROCEDURE:  [name] is an 85 year old woman who presents with a painful extremity.  Radiographs confirm a fracture above a total knee replacement and below a short intramedullary femoral nail.  Treatment options were reviewed.  There is no good role for nonoperative care.  Open reduction internal fixation was recommended.  THE risks were reviewed.  Consent for the procedure was provided.    DESCRIPTION OF PROCEDURE:  The patient was taken to the operating room on May 07, 2020.  She underwent general intubated sedation.  Once anesthetized, she was placed in the left lateral decubitus position.  Care was taken to pad bony prominences.  Large bore peripheral IVs and arterial line were inserted.  The right lower extremity was then prepped with ChloraPrep solution and draped in a sterile fashion.  A time-out was performed.  The correct extremity was verified.  The distal femur was exposed through a 15-20 cm incision.  The vastus lateralis was raised from the distal femur and perforating vessels identified and cauterized.  The fracture site was exposed.  Fracture hematoma was evacuated.  Fracture reduction was temporarily obtained with traction and rotation.  We then selected a 16 hole Synthes variable angle plate.  Fluoroscopy was used to show that the plate was of adequate length.  With the fracture reduced, a single 5 mm  screw was placed distally and then two 5 mm unicortical locking screws placed proximally.  This restored length.  The posterior sag was then addressed with a bone reduction forcep and then the remainder of the distal locking screws inserted.  A few more proximal locking screws were inserted, 4 of them being unicortical and 2 of them being bicortical.  The remainder of the distal locking holes were addressed as well.  The construct appeared to be more like a bridging construct without interfragmentary compression given her osteoporosis.  Final images were saved.  Reduction looked near anatomic.  The wounds were lavaged.  Soft tissues were infiltrated with 0.25% plain Marcaine.  The fascia lata was repaired with #2 and #1 Vicryl suture.  Subcutaneous tissue was repaired with 2-0 Vicryl and skin was closed staples.  Dressings were applied.  The patient was awakened, extubated and taken to recovery in satisfactory condition.    ESTIMATED BLOOD LOSS:  350 mL.    COMPLICATIONS:  None.    SPECIMENS:  None.    FINDINGS:  Marked comminution and osteoporosis.    FINAL COMPONENT IMPLANTED:  Synthes 16 hole variable angle plate with the appropriate length screws.        Jeanell Sparrow, MD  Assistant Professor   [facility] Department of Orthopaedics            CC:   [employer], DO   [address]   [place]       DD:  05/07/2020 13:41:08  DT:  05/07/2020 21:16:19 FP  D#:  [id number]

## 2020-05-07 NOTE — Ancillary Notes (Signed)
SBIRT    SBIRT completed, which includes mental and substance use/abuse questions, as per [facility] SBIRT model.  Pending recommendations:  None.    Biagio Snelson B Jeanean Hollett, LICSW Clinical Therapist 05/07/2020, 11:45    Pager  #[phone]
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
## 2020-05-07 NOTE — H&P (Signed)
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     Status: None    Narrative    Ferris RUTH Kleiber    Female, 85 years old.    CT KNEE RIGHT WO IV CONTRAST performed on 05/07/2020 7:26 AM.    REASON FOR EXAM:  fx    TECHNIQUE: A multiplanar CT study of the right knee was obtained without IV  contrast and submitted for evaluation.     COMPARISON:  CT lower extremity bilateral without IV contrast from  05/06/2020    FINDINGS: Streak artifact from hardware.  Redemonstration of comminuted,  periprosthetic spiral fracture involving the distal femoral shaft and  extending to the metadiaphysis. The fracture line extends to the level of  the femoral prosthesis. However, the prosthesis appears intact and aligned.  Hyperdense effusion is noted in the patellofemoral compartment,  representing hemarthrosis. There is surrounding soft tissue tissue  stranding/edema. No measurable soft tissue hematoma is identified.      Impression    Redemonstration of distal femoral periprosthetic fracture with intact and  aligned knee prosthesis.    XR PELVIS  Status: None (Preliminary result)    Narrative    Symantha RUTH Meiring  Female, 85 years old.    XR PELVIS performed on 05/07/2020 7:27 AM.    REASON FOR EXAM:    pain    TECHNIQUE:   1 view/1 image(s) submitted for interpretation.    COMPARISON:   Bilateral lower extremity CT May 06, 2020 6:37 PM    FINDINGS:  The  main pelvic ring, 2 obturator foramina and acetabulum are intact. The  sacroiliac joint and pubic symphysis joint are normal anatomic alignment.  There is a left total hip arthroplasty. There are postoperative changes  partially visualized right proximal femur. There is advanced right femoral  acetabular joint degenerative arthrosis.       Impression    No evidence of fracture or dislocation involving the hips.       CT ABDOMEN PELVIS WO IV CONTRAST     Status: None    Narrative    CT ABDOMEN PELVIS WO IV CONTRAST performed on 05/07/2020 7:30 AM    INDICATION: 85 years old Female  fx    TECHNIQUE: Axial images from the lung bases through the pelvis with  reformatted coronal and sagittal images    RADIATION DOSE: 556.30 mGycm    COMPARISON: 08/04/2014.    FINDINGS:    Chest: The included lung bases show no consolidation or pleural effusion.    Liver: Normal in size and attenuation.    Gallbladder/Biliary System: The gallbladder is surgically absent. There is  mild persistent dilatation of intrahepatic biliary ducts, unchanged since  2015 exam. Unchanged dilatation of common bile duct, which is likely  related to cholecystectomy.    Spleen: Spleen is surgically absent.     Pancreas: The pancreas is moderately atrophic without pancreatic ductal  dilation.    Adrenals: Grossly unremarkable bilaterally.    Kidney/Ureters/Bladder: The kidneys are normal in size without evidence of  hydroureteronephrosis or renal calculi. Indeterminate focal density right  kidney upper pole. The bladder is non-distended with a Foley catheter,  limiting evaluation of the bladder wall.    Reproductive Organs: The pelvic structures are not well-visualized  secondary to streak artifact from hip hardware.    Stomach: Stomach is decompressed.    Bowel: No abnormally dilated bowel loops are seen. Scattered colonic  diverticulosis is identified without evidence of acute diverticulitis.    Vasculature: The abdominal aorta is of normal caliber and  demonstrate  diffuse atherosclerotic disease.    Peritoneal Cavity/Lymph Nodes: No free fluid, free air or suspicious  adenopathy is identified.    Soft Tissues/Bones: Redemonstration of left hip hematoma, measuring  proximally 9.5 x 4.4 x 10.0 cm and stable since prior exam. Left-sided hip  arthroplasty appears intact and aligned. Right femur intramedullary nail is  noted. Degenerative changes are noted in right hip joint. Multilevel  advanced degenerative changes of lumbar spine are also noted with scoliotic  curvature.      Impression    1.No acute intra-abdominal process.  2.Redemonstration of stable 10cm left hip hematoma. No acute fracture  is identified.  3.Possible small hemorrhagic cyst involving the upper pole the right  kidney. This is too small to characterize on this study.   DIAGNOSTIC FLUORO     Status: None    Narrative    *Procedure not read by radiology.  *Refer to procedure note for result.       ASSESSMENT & PLAN:       Active Hospital Problems  Diagnosis    Fall       Theresa Greene is an 85 yo female presenting as a fall at 4 am on 9/24 when she was attempting to sit on toliet and missed and hit her L side on the shower. She was discharged and went home. Pt presented again later that night with a syncopal episode and hit her R side and is now presenting with R knee and hip pain. Pt is amnestic to the second fall and per husband reportedly did not hit her head. Pt denies any chest pain and SOB.    9/25: To OR with ortho for ORIF of R distal femur fx, post-op upgrade 2/2 to drop in Hgb and acidotic    NEURO:  GCS: E4=Spontaneous (Opens Eyes on Own) M6=Normal (Follows Simple Commands) V5=Normal Conversation  Imaging:   --CT Brain (05/06/20): No hemorrhage hematoma or mass effect  --CT Cervical Spine(05/07/20): No evidence of acute cervical spine fracture or traumatic  malalignment.    Sz prophylaxis:  None  Sedation/analgesia:   --Tylenol 632m q4h   --Roxi 5/149mPRN q4h  --Gabapentin 10071mID  PRN    Antipsychotics: None  Neurochecks q4hr    SBP < 160    Plan:   Continue to monitor for any change in mental status or increased pain    CARDIOVASCULAR:  Systolic (24h54UJWAvgJXB:147Min:93 , MaxWGN:562  Diastolic (24h13YQMAvgVHQ:46in:46, Max:89     ART-Line  MAP: 65 mmHg   Troponins: No results found for: CKMB   Meds:   --Amiodarone 200m21mily   --Atorvastatin 20mg15mly  --Lopressor 25mg 48my  --Procardia XL 30mg B25m Held  --Nitrostat 0.4mg q5m43mPRN21m Pressors: None  EKG: Atrial paced rhythm    Plan:  Continue to monitor vitals via HR and MAP  Follow up on TTE  Trending troponins q4h      PULMONARY:  Airway Ventilator Settings     Not on Ventilator     SpO2  Avg: 99.1 %  Min: 91 %  Max: 100 %    Blood Gas:  Recent Labs     05/07/20  1306 05/07/20  1459 05/07/20  1712   FI02 50 44 28.0   PH 7.28* 7.23* 7.18*   PCO2 37.0 43.0 49.00   PO2 241.0* 226.0* 42.0   BICARBONATE 18.3 17.8* 16.1*   BASEDEFICIT 8.6* 9.2* 10.1*     Nebs:  None  Imaging:   CT Chest (05/07/20): No evidence of acute traumatic injury in the chest.  Daily SBT/Incentive spirometry: Ordered    Plan:  Continue to monitor daily CXR    GI:  Diet: DIET CLEAR LIQUID   Recent Labs     05/07/20  0523   BILIRUBIN Negative     Last BM: Last Bowel Movement:  (PTA)  Prophylaxis:  Protonix 40mg dail57mowel regimen:  Senokot  Nausea/Vomiting: Zofran 4mg q8h PR30mcompazine for second line   Feeding Access: None    Plan:  Continue with clear liquid diet    RENAL/GU:   Recent Labs     05/07/20  0355 05/07/20  0355 05/07/20  0523 05/07/20  0628 05/07/20  1131 05/07/20  1131 05/07/20  1306 05/07/20  1433 05/07/20  1459 05/07/20  1712   SODIUM 138   137   < >  --  136 135*  --  136* 137  --   --    POTASSIUM 5.7*  --   --  5.3*   5.4*  --   --   --  5.3*  --   --    CHLORIDE 108   107   < >  --  102 105  --  107 108  --   --    BICARBONATE 19.0*  --   --   --  20.8   < > 18.3  --  17.8* 16.1*   BUN 50*  --   --  37*  --   --   --  52*  --   --    CREATININE  2.34*  --   --  1.75*  --   --   --  2.58*  --   --    GLUCOSE 134*  --  Negative  --  117*  --  139*  --   --   --    ANIONGAP 12  --   --  22*  --   --   --  13  --   --    CALCIUM 8.3*  --   --  5.3*  --   --   --  7.9*  --   --     < > = values in this interval not displayed.         Intake/Output Summary (Last 24 hours) at 05/07/2020 1743  Last data filed at 05/07/2020 1645  Gross per 24 hour   Intake 3114 ml   Output 490 ml   Net 2624 ml     Foley in critically ill pt for strict I/O's  UOP: 25-40cc/hr  IV fluids: Plasmalyte @ 50 ml/hr  Diuretics:  None  Electrolytes replaced:   -Calcium    Plan:  Continue to monitor lytes on BMP    HEME:  Recent Labs     05/06/20  1518 05/07/20  0355 05/07/20  1433   HGB 9.2* 8.3* 7.7*   HCT 30.3* 25.9* 23.8*   PLTCNT 297 319  --    APTT  --  25.3  --    INR  --  1.03  --      Transfusions: 2U PRBC, 1U of platelets  Prophylaxis: Lovenox 56m qd    Plan:  Serial H/h q6h x 24 hours  Follow up on TEG      ID:  Temp (24hrs) Max:36.6 C (97.9 F)    Recent Labs     05/06/20  1518 05/07/20  0355   WBC 14.1* 14.1*   PMNS 84 79     Blood cultures:  None  Urine cultures:  Negative nitrites  BAL:  None  OR cultures:  None  ABX:  Ancef 2g daily  Lines:  -PIV x3    Plan:  Continue to monitor on daily labs    ENDO:  No results for input(s): GLUCOSEPOC in the last 24 hours.  Glucose: 145  SSI-Conservative  Steroids: One time dose of 450mof dexamethasone    Plan:  Monitor glucose on daily labs    MSK:  Fractures: R distal femur fx s/p OR with Ortho on 9/25  Skin/wound issues: Left foot with dorsal abrasions    OTHER:  Activity: Bedrest, HOB 30deg  PT/OT:  ordered  MNT:  ordered    PLAN:    --Ancef post-op 24 hours  --Clear liquid diet  --Serial H/h q6h  --Follow lactate  --Tylenol 65040m4h   --Roxi 5/32m28mN q4h  --Gabapentin 100mg39m PRN  --Zofran 4mg q59mPRN /  compazine for second line  --TDWB RLE  --SSI Protocol conservative  --Lovenox 45m daily starting on 9/26 at 0900  --TEG is  normal  --Hyperkalemia protocol ordered, will follow potassium      RErasmo Downer DDS  05/07/2020, 17:43  Late entry for 05/07/20.  I personally saw and examined the patient.  I reviewed the resident's note.  I agree with the findings and plan of care as documented in the resident's note.  Any exceptions/additions are edited/noted.     DJanora Norlander MD 05/08/2020, 06:35

## 2020-05-07 NOTE — Brief Op Note (Addendum)
[facility]                                                     BRIEF OPERATIVE NOTE    Patient Name: Theresa, Greene Number: [id number]  Date of Service: 05/07/2020   Date of Birth: January 29, 1935    All elements must be documented.    Pre-Operative Diagnosis: periprosthetic (intra-prosthetic) R distal femur fracture with osteoporosis    Post-Operative Diagnosis: the same  Procedure(s)/Description:  ORIF with long locking plate  Findings/Complexity (inherent to the procedure performed): severe osteoporosis      Attending Surgeon: Graciela Husbands  Assistant(s): Kateri Plummer    Anesthesia Type: General  Estimated Blood Loss:  350  Blood Given:  none  Fluids Given: crystalloid  Complications (not routinely expected or not inherent to difficulty/nature of procedure): none  Characteristic Event (routinely expected or inherent to the difficulty/nature of the procedure): none  Did the use of current and/or prior Anticoagulants impact the outcome of the case? no  Wound Class: Clean Wound: Uninfected operative wounds in which no inflammation occurred    Tubes: None  Drains: None  Specimens/ Cultures: none  Implants: Synthes           Disposition: PACU - hemodynamically stable.  Condition: stable    Jeanell Sparrow, MD      Toe touch weight bearing for 6 to 8 weeks  Kathee Delton MD  Assistant Professor Orthopaedic Surgery  Ph   [phone]  Fax [phone]

## 2020-05-07 NOTE — Nurses Notes (Signed)
Ortho physicians at bedside requesting to transfer the patient to Pre-op for surgery.  Ortho physicians did not have patient on TRAM for transportation. Telemetry notified of patient off the monitor.   Mittie Bodo, RN  05/07/2020, 10:45

## 2020-05-07 NOTE — Anesthesia Transfer of Care (Signed)
ANESTHESIA TRANSFER OF CARE   Theresa Greene is a 85 y.o. ,female, Weight: 60 kg (132 lb 4.4 oz)   had Procedure(s) with comments:  OPEN REDUCTION INTERNAL FIXATION FRACTURE FEMUR - peri-prostetic  performed  05/07/20   Primary Service: Altamese Cabal Dav*    Past Medical History:   Diagnosis Date    Anemia 12/25/2019    Last Assessment & Plan:  Formatting of this note might be different from the original. Hgb stable    CAD S/P percutaneous coronary angioplasty 08/06/2014    Formatting of this note might be different from the original. Pt reports 5 years ago    Charcot's joint of foot, left 08/06/2014    Chronic osteoarthritis 08/06/2014    Gastroesophageal reflux disease 08/06/2014    History of cardiac pacemaker 08/06/2014    History of CVA (cerebrovascular accident) 08/06/2014    Formatting of this note might be different from the original. 1995, no residual deficits    HTN (hypertension)     Hyperlipidemia 12/25/2019    Neuropathy (CMS HCC)     PAF (paroxysmal atrial fibrillation) (CMS [place]) 08/06/2014    PUD (peptic ulcer disease) 08/06/2014    Formatting of this note might be different from the original. C/b GIB Spring 2015    Rheumatoid arthritis (CMS [place]) 08/06/2014    Stage 3 chronic kidney disease 12/25/2019    Last Assessment & Plan:  Formatting of this note might be different from the original. Renal function stable, at baseline Stable electrolytes; restart sodium bicarb 650 mg BID  Continue renal protective ARB Discussed CKD risk factor modifications, including tight BP/glucose control, low protein diet, and avoidance of NSAIDs    Type 2 diabetes mellitus (CMS [place]) 08/06/2014    Vitamin D deficiency 12/25/2019      Allergy History as of 05/07/20     CODEINE       Noted Status Severity Type Reaction    04/04/20 1835 Danae Chen, RN 06/09/09 Active Low   Other Adverse Reaction (Add comment), Nausea/ Vomiting    Comments: Stomach pain  Stomach pain             ETODOLAC       Noted  Status Severity Type Reaction    04/04/20 1835 Danae Chen, RN 06/09/09 Active   NO Steroids unless approved by Attending Physician          IODINATED CONTRAST MEDIA       Noted Status Severity Type Reaction    04/04/20 1835 Danae Chen, RN 08/06/14 Active High  Anaphylaxis              I completed my transfer of care / handoff to the receiving personnel during which we discussed:  Access, Airway, All key/critical aspects of case discussed, Analgesia, Antibiotics, Expectation of post procedure, Fluids/Product, Gave opportunity for questions and acknowledgement of understanding, Labs and PMHx    Post Location: PACU                                          Additional Info:Pt awake and responsive, exchanging well on O2 via FM.  VSS, report to RN                        Last OR Temp: Temperature: 36.3 C (97.3 F)  ABG:  PH (ARTERIAL)   Date  Value Ref Range Status   05/07/2020 7.28 (L) 7.35 - 7.45 Final     PH (T)   Date Value Ref Range Status   05/07/2020 7.30 (L) 7.35 - 7.45 Final     PCO2 (ARTERIAL)   Date Value Ref Range Status   05/07/2020 37.0 35.0 - 45.0 mm/Hg Final     PCO2 (VENOUS)   Date Value Ref Range Status   05/07/2020 44.00 41.00 - 51.00 mm/Hg Final     PO2 (ARTERIAL)   Date Value Ref Range Status   05/07/2020 241.0 (H) 72.0 - 100.0 mm/Hg Final     PO2 (VENOUS)   Date Value Ref Range Status   05/07/2020 38.0 35.0 - 50.0 mm/Hg Final     SODIUM   Date Value Ref Range Status   05/07/2020 136 (L) 137 - 145 mmol/L Final     POTASSIUM   Date Value Ref Range Status   05/07/2020 5.4 (H) 3.5 - 5.1 mmol/L Final     Comment:     Results can be 0.1-0.3 mmol/L lower in plasma than serum, owing to lack of platelet activation in plasma specimens.   05/07/2020 5.3 (H) 3.5 - 5.1 mmol/L Final     KETONES   Date Value Ref Range Status   05/07/2020 Negative Negative mg/dL Final     WHOLE BLOOD POTASSIUM   Date Value Ref Range Status   05/07/2020 5.5 (H) 3.5 - 4.6 mmol/L Final     CHLORIDE   Date Value Ref Range Status    05/07/2020 107 101 - 111 mmol/L Final     CALCIUM   Date Value Ref Range Status   05/07/2020 5.3 (LL) 8.8 - 10.2 mg/dL Final     Calculated P Axis   Date Value Ref Range Status   08/24/2019 96 degrees Final     Calculated R Axis   Date Value Ref Range Status   05/07/2020 77 degrees Final     Calculated T Axis   Date Value Ref Range Status   05/07/2020 80 degrees Final     IONIZED CALCIUM   Date Value Ref Range Status   05/07/2020 1.11 1.10 - 1.35 mmol/L Final     LACTATE   Date Value Ref Range Status   05/07/2020 0.9 0.0 - 1.3 mmol/L Final     HEMOGLOBIN   Date Value Ref Range Status   05/07/2020 6.0 (LL) 12.0 - 18.0 g/dL Final     OXYHEMOGLOBIN   Date Value Ref Range Status   05/07/2020 97.9 85.0 - 98.0 % Final     CARBOXYHEMOGLOBIN   Date Value Ref Range Status   05/07/2020 0.3 0.0 - 2.5 % Final     MET-HEMOGLOBIN   Date Value Ref Range Status   05/07/2020 0.4 0.0 - 2.0 % Final     BASE DEFICIT   Date Value Ref Range Status   05/07/2020 8.6 (H) 0.0 - 3.0 mmol/L Final   05/07/2020 6.9 (H) -3.0 - 3.0 mmol/L Final     BICARBONATE (ARTERIAL)   Date Value Ref Range Status   05/07/2020 18.3 18.0 - 26.0 mmol/L Final     BICARBONATE (VENOUS)   Date Value Ref Range Status   05/07/2020 19.0 (L) 22.0 - 26.0 mmol/L Final     TEMPERATURE, COMP   Date Value Ref Range Status   05/07/2020 35.9 15.0 - 40.0 C Final     %FIO2 (VENOUS)   Date Value Ref Range Status   05/07/2020 21.0 % Final  Airway:* No LDAs found *  Blood pressure (!) 153/67, pulse 70, temperature 36.3 C (97.3 F), resp. rate 12, height 1.676 m (5' 6" ), weight 60 kg (132 lb 4.4 oz), SpO2 100 %.

## 2020-05-07 NOTE — ED Nurses Note (Signed)
Report phoned to pam,rn at [facility] ed. Husband notified earlier by phone that pt will be transferred to [facility] emergency dept. Voiced understanding.

## 2020-05-07 NOTE — Anesthesia Procedure Notes (Addendum)
Arterial Line Procedure   Date/Time: 05/07/2020 12:02 PM   Pt location: In OR  Consent:     Consent given by:  Patient    Risks discussed:  Bleeding and pain  Universal protocol:     Procedure explained and questions answered to patient or proxy's satisfaction: yes      Patient identity confirmed:  Verbally with patient, arm band and hospital-assigned identification number  Pre-procedure details:     Preparation: Preprocedure hand washing was performed; sterile field was maintained         Skin Prep used: Chlorhexidine gluconate and Isopropyl alcohol  Anesthesia (see MAR for exact dosages):     Anesthesia method:  Under general anesthesia  A 20 G Catheter type: Arrow 1 and 3/4 inch in length,  Placed on the left  radial artery  using anatomical landmarks, palpation, guidewire and Seldinger With  number of attempts:1.Secured with: transparent dressing   MEDICATIONS:     Post-procedure details:blood withdrawn easily, flushes easily and good waveform  Complications:none  Performed By:  Performing provider: Vanita Ingles, MD Authorizing provider: Sheldon Silvan, MD  I was present and supervised/observed the entire procedure.  Sheldon Silvan, MD 05/07/2020, 14:28
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
## 2020-05-07 NOTE — ED Attending Note (Signed)
I was physically present and directly supervised this patients care. Patient seen and examined with the resident, Dr. Nada Libman, and history and exam reviewed. Key elements in addition to and/or correction of that documentation are as follows:  Patient is a 85 y.o.  female presenting to the ED with syncope, fall, and right femur fracture.  Transfer P2 from outside hospital.  Was seen at outside hospital ED earlier in the day yesterday for syncope, sent home, fell, unable to bear weight, returned to ED after additional syncopal episode, found to have femur fracture, large hematoma on left thight.  CT head negative.    ROS: Otherwise negative, if commented on in the HPI.   There were no vitals filed for this visit.    PMH, PSH, medications, allergies, SH, and FH per resident note. Important aspects of these fields pertaining to today's visit taken into consideration during history/physical and MDM.    Physical Exam:     Please see primary note for physical exam findings. Additional findings of my own are documented below.   Systemically well and comfortable, no resp. Distress.  Large hematoma of left thight  Right leg with mild deformity at the thigh.  Pulses intact distal, neurologically intact distal to injury.  Awake, alert, oriented.      MDM:   This is a 85 y.o. female presenting to the ED with syncope and fall, hematoma and femur fracture. Will admit to trauma for further evaluation of syncope and for treatment of traumatic injuries. EKG reviewed and unremarkable.    During the patient's stay in the emergency department, images and/or labs were performed to assist with medical decision making and were reviewed by myself.     Pertinent ED course:  ED Course as of May 07 404   Sat May 07, 2020   0404 Nsr no ischemic changes    [EP]      ED Course User Index  [EP] Jonette Eva, MD         Impression:   1. Syncope  2. Femur fracture, right  3. Hematoma, left thigh    Disposition:  See Resident Note for final  disposition      Chart completed after conclusion of patient care due to time constraints of direct patient care during shift.  Chart was dictated using voice recognition software, which may lead to minor grammatical or syntax errors.     Jonette Eva, MD  05/09/2020, 10:04

## 2020-05-07 NOTE — Nurses Notes (Signed)
Patient arrived to [facility]. SICU Md and Primary RN at bedside.

## 2020-05-07 NOTE — Nurses Notes (Signed)
Dr. Perlie Gold at bedside. Stated patient is clear to go to step down.

## 2020-05-07 NOTE — Consults (Signed)
[place] Department of Orthopaedics  Service: Orthopaedic Trauma/Call  Attending: Graciela Husbands  Progress Note  05/07/2020    Name: Theresa Greene  DOB: 1934-12-16  MRN: [id number]    RECENT ORTHO SURGERY:  - ORIF R PPx distal femur fx = 9/25 (Attending Surgeon - Graciela Husbands)    SUBJECTIVE:  85 y.o. female resting in bed. Maintaining airway. Pain controlled.     OBJECTIVE:  AF, VSS   BP 132/82    Pulse 70    Temp 36.3 C (97.3 F)    Resp 15    Ht 1.676 m (5\' 6" )    Wt 60 kg (132 lb 4.4 oz)    SpO2 100%    BMI 21.35 kg/m     GEN - NAD,  resting in bed    RLE:  -- Inspection: Dressing clean, dry, and intact.   -- Sensation: Responds to light touch to dorsal and plantar foot  --Motor:  + ankle dorsiflexion and plantarflexion, + EHL/FHL.  --CV: 2+ DP/PT pulse.  BCR < 2 sec in all toes     Hemogram   Lab Results   Component Value Date/Time    WBC 14.1 (H) 05/07/2020 03:55 AM    HGB 8.3 (L) 05/07/2020 03:55 AM    HCT 25.9 (L) 05/07/2020 03:55 AM    PLTCNT 319 05/07/2020 03:55 AM    RBC 2.73 (L) 05/07/2020 03:55 AM    MCV 94.9 05/07/2020 03:55 AM    MCHC 32.0 05/07/2020 03:55 AM    MCH 30.4 05/07/2020 03:55 AM    MPV 10.5 05/07/2020 03:55 AM        Basic Metabolic Profile    Lab Results   Component Value Date/Time    SODIUM 136 (L) 05/07/2020 01:06 PM    POTASSIUM 5.4 (H) 05/07/2020 06:28 AM    POTASSIUM 5.3 (H) 05/07/2020 06:28 AM    CHLORIDE 107 05/07/2020 01:06 PM    CO2 12 (L) 05/07/2020 06:28 AM    ANIONGAP 22 (H) 05/07/2020 06:28 AM    Lab Results   Component Value Date/Time    BUN 37 (H) 05/07/2020 06:28 AM    CREATININE 1.75 (H) 05/07/2020 06:28 AM    GLUCOSENF 101 05/07/2020 06:28 AM          Recent Pertinent Imaging:  - intra-op      ASSESSMENT:  85 y.o. female Day of Surgery s/p ORIF R PPx distal femur fx    PLAN:  - Weightbearing: TDWB RLE  - PT/OT: ordered; recs post-op pending  - DVT prophylaxis: per primary, OK to start POD 1 am. Needs 3 - 4 weeks of ppx  - Antibiotics: Ancef 24H post-op  - Drain: none  - Dressing: located  RLE, plan to change POD 2 vs 3  - Labs: as above, need H and H in the am  - Imaging: as above  - Diet: OK to eat from ortho perspective after OR.  - Nursing instructions: OOB all meals  - Dispo: pending Abx, placement  - Follow-up: will see back in Dr. 97 clinic in 2 wks. Order placed in Epic.    Odessa Fleming, MD  05/07/2020, 13:59  Resident, Department of Orthopaedics      The patient's condition and plan of care was discussed with me.  The note above was personally reviewed and appended before signature.    05/09/2020 MD  Assistant Professor Orthopaedic Surgery  Pager [phone]

## 2020-05-07 NOTE — Nurses Notes (Signed)
778- Estill  critical calcium 5.2 FYI  thanks, [phone]  Angelique Blonder, first on call for trauma, paged.   Luberta Mutter, RN  05/07/2020, 09:18

## 2020-05-07 NOTE — Anesthesia Preprocedure Evaluation (Addendum)
ANESTHESIA PRE-OP EVALUATION  Planned Procedure: OPEN REDUCTION INTERNAL FIXATION FRACTURE FEMUR (Right Leg Upper)  Review of Systems     anesthesia history negative               Pulmonary  negative pulmonary ROS,    Cardiovascular    Pacemaker, hypertension and well controlled ,  ,beta blocker therapy  ,beta blockers in last 24 hours     GI/Hepatic/Renal    GERD, well controlled and renal insufficiency     Endo/Other    rheumatoid arthritis,   type 2 diabetes/ stable     Neuro/Psych/MS        Cancer                     Physical Assessment      Airway       Mallampati: I    TM distance: >3 FB    Neck ROM: full  Mouth Opening: good.  No Facial hair          Dental           (+) edentulous           Pulmonary    Breath sounds clear to auscultation       Cardiovascular    Comment: Paced rhythm           Other findings            Plan  ASA 3     Planned anesthesia type: general            Additional Plans: Arterial line  Intravenous induction     Anesthesia issues/risks discussed are: Dental Injuries, Nerve Injuries, Sore Throat, Difficult Airway, Blood Loss, Aspiration, Intraoperative Awareness/ Recall, Stroke, Cardiac Events/MI, Post-op Pain Management and PONV.  Anesthetic plan and risks discussed with patient.      Use of blood products discussed with patient who consented to blood products.     Patient's NPO status is appropriate for Anesthesia.           Plan discussed with attending and CRNA.
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
## 2020-05-07 NOTE — ED Nurses Note (Signed)
[facility] at bedside to transport pt to [facility]. Report given.

## 2020-05-07 NOTE — Nurses Notes (Signed)
1509-Notified Dr. Perlie Gold of patients ph 7.23.    1533- Notified Dr. Perlie Gold of Hgb results and BMP results.

## 2020-05-07 NOTE — Nurses Notes (Signed)
Results for IZEL, HOCHBERG (MRN [id number]) as of 05/07/2020 09:38   Ref. Range 05/07/2020 06:28   CALCIUM Latest Ref Range: 8.8 - 10.2 mg/dL 5.3 (LL)     Spoke to [employer] - Tums to be ordered. Albumin from 9/18 was 4.1.

## 2020-05-07 NOTE — Nurses Notes (Signed)
Called patients husband to give a update. No answer.

## 2020-05-08 ENCOUNTER — Inpatient Hospital Stay (HOSPITAL_COMMUNITY): Payer: Medicare Other

## 2020-05-08 DIAGNOSIS — T25022A Burn of unspecified degree of left foot, initial encounter: Secondary | ICD-10-CM

## 2020-05-08 DIAGNOSIS — Z950 Presence of cardiac pacemaker: Secondary | ICD-10-CM

## 2020-05-08 DIAGNOSIS — S72001A Fracture of unspecified part of neck of right femur, initial encounter for closed fracture: Secondary | ICD-10-CM

## 2020-05-08 DIAGNOSIS — N189 Chronic kidney disease, unspecified: Secondary | ICD-10-CM

## 2020-05-08 DIAGNOSIS — N179 Acute kidney failure, unspecified: Secondary | ICD-10-CM

## 2020-05-08 DIAGNOSIS — E785 Hyperlipidemia, unspecified: Secondary | ICD-10-CM

## 2020-05-08 DIAGNOSIS — I129 Hypertensive chronic kidney disease with stage 1 through stage 4 chronic kidney disease, or unspecified chronic kidney disease: Secondary | ICD-10-CM

## 2020-05-08 DIAGNOSIS — E875 Hyperkalemia: Secondary | ICD-10-CM

## 2020-05-08 DIAGNOSIS — R296 Repeated falls: Secondary | ICD-10-CM

## 2020-05-08 DIAGNOSIS — E1140 Type 2 diabetes mellitus with diabetic neuropathy, unspecified: Secondary | ICD-10-CM

## 2020-05-08 DIAGNOSIS — E1122 Type 2 diabetes mellitus with diabetic chronic kidney disease: Secondary | ICD-10-CM

## 2020-05-08 DIAGNOSIS — Z9889 Other specified postprocedural states: Secondary | ICD-10-CM

## 2020-05-08 DIAGNOSIS — S72401A Unspecified fracture of lower end of right femur, initial encounter for closed fracture: Secondary | ICD-10-CM

## 2020-05-08 DIAGNOSIS — I4891 Unspecified atrial fibrillation: Secondary | ICD-10-CM

## 2020-05-08 LAB — POC BLOOD GLUCOSE (RESULTS)
GLUCOSE, POC: 100 mg/dl (ref 70–105)
GLUCOSE, POC: 103 mg/dl (ref 70–105)
GLUCOSE, POC: 116 mg/dl — ABNORMAL HIGH (ref 70–105)
GLUCOSE, POC: 79 mg/dl (ref 70–105)
GLUCOSE, POC: 87 mg/dl (ref 70–105)
GLUCOSE, POC: 95 mg/dl (ref 70–105)

## 2020-05-08 LAB — PRODUCT: PLATELETS - UNITS: UNIT DIVISION: 0

## 2020-05-08 LAB — CROSSMATCH RED CELLS - UNITS
UNIT DIVISION: 0
UNIT DIVISION: 0
UNIT DIVISION: 0

## 2020-05-08 LAB — BASIC METABOLIC PANEL
ANION GAP: 12 mmol/L (ref 4–13)
ANION GAP: 13 mmol/L (ref 4–13)
BUN/CREA RATIO: 21 (ref 6–22)
BUN/CREA RATIO: 22 (ref 6–22)
BUN: 48 mg/dL — ABNORMAL HIGH (ref 8–25)
BUN: 43 mg/dL — ABNORMAL HIGH (ref 8–25)
CALCIUM: 7.7 mg/dL — ABNORMAL LOW (ref 8.8–10.2)
CALCIUM: 7.5 mg/dL — ABNORMAL LOW (ref 8.8–10.2)
CHLORIDE: 110 mmol/L (ref 96–111)
CHLORIDE: 114 mmol/L — ABNORMAL HIGH (ref 96–111)
CO2 TOTAL: 17 mmol/L — ABNORMAL LOW (ref 23–31)
CO2 TOTAL: 13 mmol/L — ABNORMAL LOW (ref 23–31)
CREATININE: 2.33 mg/dL — ABNORMAL HIGH (ref 0.60–1.05)
CREATININE: 1.94 mg/dL — ABNORMAL HIGH (ref 0.60–1.05)
ESTIMATED GFR: 19 mL/min/BSA — ABNORMAL LOW (ref >=60)
ESTIMATED GFR: 24 mL/min/BSA — ABNORMAL LOW (ref >=60)
GLUCOSE: 104 mg/dL (ref 65–125)
GLUCOSE: 81 mg/dL (ref 65–125)
POTASSIUM: 5.2 mmol/L — ABNORMAL HIGH (ref 3.5–5.1)
POTASSIUM: 4 mmol/L (ref 3.5–5.1)
SODIUM: 139 mmol/L (ref 136–145)
SODIUM: 140 mmol/L (ref 136–145)

## 2020-05-08 LAB — IONIZED CALCIUM WITH PH
IONIZED CALCIUM: 1.12 mmol/L (ref 1.10–1.35)
IONIZED CALCIUM: 1.08 mmol/L — ABNORMAL LOW (ref 1.10–1.35)
PH (VENOUS): 7.27 — ABNORMAL LOW (ref 7.31–7.41)
PH (VENOUS): 7.44 — ABNORMAL HIGH (ref 7.31–7.41)

## 2020-05-08 LAB — H & H
HCT: 24.6 % — ABNORMAL LOW (ref 34.8–46.0)
HCT: 24.1 % — ABNORMAL LOW (ref 34.8–46.0)
HCT: 25.6 % — ABNORMAL LOW (ref 34.8–46.0)
HGB: 8.1 g/dL — ABNORMAL LOW (ref 11.5–16.0)
HGB: 8.1 g/dL — ABNORMAL LOW (ref 11.5–16.0)
HGB: 8.4 g/dL — ABNORMAL LOW (ref 11.5–16.0)

## 2020-05-08 LAB — CREATININE URINE, RANDOM: CREATININE RANDOM URINE: 29 mg/dL — ABNORMAL LOW (ref 50–100)

## 2020-05-08 LAB — VENOUS BLOOD GAS WITH LACTATE REFLEX - INACTIVE
LACTATE: 0.9 mmol/L (ref 0.0–1.3)
PH (VENOUS): 7.29 — ABNORMAL LOW (ref 7.31–7.41)

## 2020-05-08 LAB — CBC
HCT: 24.5 % — ABNORMAL LOW (ref 34.8–46.0)
HGB: 8.2 g/dL — ABNORMAL LOW (ref 11.5–16.0)
MCH: 30.9 pg (ref 26.0–32.0)
MCHC: 33.5 g/dL (ref 31.0–35.5)
MCV: 92.5 fL (ref 78.0–100.0)
MPV: 11 fL (ref 8.7–12.5)
PLATELETS: 228 10*3/uL (ref 150–400)
RBC: 2.65 10*6/uL — ABNORMAL LOW (ref 3.85–5.22)
RDW-CV: 15.1 % (ref 11.5–15.5)
WBC: 16.4 10*3/uL — ABNORMAL HIGH (ref 3.7–11.0)

## 2020-05-08 LAB — VENOUS BLOOD GAS WITH LACTATE REFLEX
%FIO2 (VENOUS): 21 %
BASE DEFICIT: 7.7 mmol/L — ABNORMAL HIGH (ref <=3.0)
BICARBONATE (VENOUS): 18.4 mmol/L — ABNORMAL LOW (ref 22.0–26.0)
PCO2 (VENOUS): 38 mm/Hg — ABNORMAL LOW (ref 41.00–51.00)
PO2 (VENOUS): 47 mm/Hg (ref 35.0–50.0)

## 2020-05-08 LAB — PHOSPHORUS
PHOSPHORUS: 6.4 mg/dL — ABNORMAL HIGH (ref 2.3–4.0)
PHOSPHORUS: 4.7 mg/dL — ABNORMAL HIGH (ref 2.3–4.0)

## 2020-05-08 LAB — MAGNESIUM
MAGNESIUM: 2.4 mg/dL (ref 1.8–2.6)
MAGNESIUM: 2.1 mg/dL (ref 1.8–2.6)

## 2020-05-08 LAB — TROPONIN-I
TROPONIN I: 47 ng/L — ABNORMAL HIGH (ref 0–30)
TROPONIN I: 40 ng/L — ABNORMAL HIGH (ref 0–30)
TROPONIN I: 46 ng/L — ABNORMAL HIGH (ref 0–30)

## 2020-05-08 LAB — POTASSIUM
POTASSIUM: 5.4 mmol/L — ABNORMAL HIGH (ref 3.5–5.1)
POTASSIUM: 4.7 mmol/L (ref 3.5–5.1)

## 2020-05-08 LAB — TYPE AND SCREEN
ABO/RH(D): O POS
ANTIBODY SCREEN: NEGATIVE
UNITS ORDERED: 2

## 2020-05-08 LAB — SODIUM, RANDOM URINE: SODIUM RANDOM URINE: 91 mmol/L

## 2020-05-08 MED ORDER — DOCUSATE SODIUM 100 MG CAPSULE
100.0000 mg | ORAL_CAPSULE | Freq: Two times a day (BID) | ORAL | Status: DC
Start: 2020-05-08 — End: 2020-05-12
  Administered 2020-05-08: 0 mg via ORAL
  Administered 2020-05-08: 100 mg via ORAL
  Administered 2020-05-09: 0 mg via ORAL
  Administered 2020-05-09 – 2020-05-11 (×4): 100 mg via ORAL
  Administered 2020-05-11: 09:00:00 0 mg via ORAL
  Administered 2020-05-12: 100 mg via ORAL
  Filled 2020-05-08 (×7): qty 1

## 2020-05-08 MED ORDER — LOSARTAN 50 MG TABLET
50.0000 mg | ORAL_TABLET | Freq: Every day | ORAL | Status: DC
Start: 2020-05-08 — End: 2020-05-12

## 2020-05-08 MED ORDER — HEPARIN (PORCINE) 5,000 UNIT/ML INJECTION SOLUTION
5000.0000 [IU] | Freq: Three times a day (TID) | INTRAMUSCULAR | Status: DC
Start: 2020-05-08 — End: 2020-05-12
  Administered 2020-05-08 – 2020-05-12 (×13): 5000 [IU] via SUBCUTANEOUS
  Filled 2020-05-08 (×15): qty 1

## 2020-05-08 MED ORDER — HYDRALAZINE 25 MG TABLET
25.0000 mg | ORAL_TABLET | Freq: Two times a day (BID) | ORAL | Status: DC
Start: 2020-05-08 — End: 2020-05-12

## 2020-05-08 MED ORDER — HYDROMORPHONE 1 MG/ML INJECTION WRAPPER
0.2000 mg | INJECTION | INTRAMUSCULAR | Status: DC | PRN
Start: 2020-05-08 — End: 2020-05-09
  Administered 2020-05-08 – 2020-05-09 (×2): 0.2 mg via INTRAVENOUS
  Filled 2020-05-08 (×2): qty 1

## 2020-05-08 MED ORDER — FUROSEMIDE 10 MG/ML INJECTION SOLUTION
20.0000 mg | Freq: Once | INTRAMUSCULAR | Status: AC
Start: 2020-05-08 — End: 2020-05-08
  Administered 2020-05-08: 20 mg via INTRAVENOUS
  Filled 2020-05-08: qty 2

## 2020-05-08 MED ORDER — HYDRALAZINE 20 MG/ML INJECTION SOLUTION
5.0000 mg | Freq: Three times a day (TID) | INTRAMUSCULAR | Status: DC | PRN
Start: 2020-05-08 — End: 2020-05-10
  Administered 2020-05-08: 5 mg via INTRAVENOUS
  Filled 2020-05-08 (×2): qty 1

## 2020-05-08 MED ORDER — INSULIN REGULAR HUMAN 100 UNIT/ML INJ FOR MIXTURES -RX ADMIN UNIT ROUNDS TO NEAREST 0.01 ML
10.0000 [IU] | INTRAMUSCULAR | Status: AC
Start: 2020-05-08 — End: 2020-05-08
  Administered 2020-05-08: 10 [IU] via INTRAVENOUS

## 2020-05-08 MED ORDER — DEXTROSE 50 % IN WATER (D50W) INTRAVENOUS SYRINGE
25.0000 g | INJECTION | Freq: Once | INTRAVENOUS | Status: AC
Start: 2020-05-08 — End: 2020-05-08
  Administered 2020-05-08: 50 mL via INTRAVENOUS

## 2020-05-08 MED ORDER — DEXTROSE 50 % IN WATER (D50W) INTRAVENOUS SYRINGE
50.0000 g | INJECTION | INTRAVENOUS | Status: DC
Start: 2020-05-08 — End: 2020-05-08
  Filled 2020-05-08: qty 100

## 2020-05-08 MED ORDER — LIPASE-PROTEASE-AMYLASE 12,000-38,000-60,000 UNIT CAPSULE,DELAYED REL
3.0000 | DELAYED_RELEASE_CAPSULE | Freq: Three times a day (TID) | ORAL | Status: DC
Start: 2020-05-08 — End: 2020-05-12
  Administered 2020-05-08: 0 via ORAL
  Administered 2020-05-09 (×2): 3 via ORAL
  Administered 2020-05-09: 0 via ORAL
  Administered 2020-05-10 – 2020-05-12 (×8): 3 via ORAL
  Filled 2020-05-08 (×13): qty 3

## 2020-05-08 MED ORDER — CALCIUM GLUCONATE 100 MG/ML (10 %) INTRAVENOUS SOLUTION
1000.0000 mg | INTRAVENOUS | Status: AC
Start: 2020-05-08 — End: 2020-05-08
  Administered 2020-05-08: 1000 mg via INTRAVENOUS
  Filled 2020-05-08: qty 10

## 2020-05-08 MED ORDER — NIFEDIPINE ER 30 MG TABLET,EXTENDED RELEASE 24 HR
30.0000 mg | EXTENDED_RELEASE_TABLET | Freq: Two times a day (BID) | ORAL | Status: DC
Start: 2020-05-08 — End: 2020-05-12
  Administered 2020-05-08 – 2020-05-09 (×2): 0 mg via ORAL
  Administered 2020-05-09 – 2020-05-10 (×2): 30 mg via ORAL
  Filled 2020-05-08 (×3): qty 1

## 2020-05-08 MED ORDER — SODIUM BICARBONATE 650 MG TABLET
650.0000 mg | ORAL_TABLET | Freq: Two times a day (BID) | ORAL | Status: DC
Start: 2020-05-08 — End: 2020-05-12
  Administered 2020-05-08: 0 mg via ORAL
  Administered 2020-05-08: 650 mg via ORAL
  Administered 2020-05-09: 0 mg via ORAL
  Administered 2020-05-09 – 2020-05-12 (×6): 650 mg via ORAL
  Filled 2020-05-08 (×8): qty 1

## 2020-05-08 MED ORDER — BENZOCAINE-MENTHOL SORE THROAT LOZENGE WRAPPER
1.0000 | LOZENGE | Status: DC | PRN
Start: 2020-05-08 — End: 2020-05-12
  Administered 2020-05-08: 1 via ORAL
  Filled 2020-05-08: qty 3

## 2020-05-08 NOTE — Nurses Notes (Signed)
Pt transported form SICU06 to 7NE20 with tele-pack. All belongings sent with pt.
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
## 2020-05-08 NOTE — Nurses Notes (Addendum)
Recheck of patient's BP was 180/66, HR of 69, paged and notified MD Tahsin of MH6.

## 2020-05-08 NOTE — Consults (Signed)
[place] Department of Orthopaedics  Service: Orthopaedic Trauma/Call  Attending: Graciela Husbands  Progress Note  05/08/2020    Name: Theresa Greene  DOB: 08/08/1935  MRN: [id number]    RECENT ORTHO SURGERY:  - ORIF R PPx distal femur fx = 9/25 (Attending Surgeon - Graciela Husbands)    SUBJECTIVE:  85 y.o. female resting in bed in ICU. Maintaining airway. Pain controlled. Wide awake, understands surgery and can remember her post-op instructions that were discussed with Dr. Graciela Husbands. Seems to be doing very well this am, very pleasant as well.    OBJECTIVE:  AF, VSS   BP (!) 125/52    Pulse 70    Temp 36.7 C (98.1 F)    Resp (!) 24    Ht 1.689 m (5' 6.5")    Wt 60.5 kg (133 lb 6.1 oz)    SpO2 96%    BMI 21.21 kg/m     GEN - NAD,  resting in bed    RLE:  -- Inspection: Dressing clean, dry, and intact.   -- Sensation: Responds to light touch to dorsal and plantar foot  --Motor:  + ankle dorsiflexion and plantarflexion, + EHL/FHL.  --CV: 2+ DP/PT pulse.  BCR < 2 sec in all toes     Hemogram   Lab Results   Component Value Date/Time    WBC 16.4 (H) 05/08/2020 12:20 AM    HGB 8.1 (L) 05/08/2020 04:07 AM    HCT 24.6 (L) 05/08/2020 04:07 AM    PLTCNT 228 05/08/2020 12:20 AM    RBC 2.65 (L) 05/08/2020 12:20 AM    MCV 92.5 05/08/2020 12:20 AM    MCHC 33.5 05/08/2020 12:20 AM    MCH 30.9 05/08/2020 12:20 AM    MPV 11.0 05/08/2020 12:20 AM        Basic Metabolic Profile    Lab Results   Component Value Date/Time    SODIUM 139 05/08/2020 12:20 AM    POTASSIUM 5.4 (H) 05/08/2020 04:07 AM    CHLORIDE 110 05/08/2020 12:20 AM    CO2 17 (L) 05/08/2020 12:20 AM    ANIONGAP 12 05/08/2020 12:20 AM    Lab Results   Component Value Date/Time    BUN 48 (H) 05/08/2020 12:20 AM    CREATININE 2.33 (H) 05/08/2020 12:20 AM    GLUCOSENF 104 05/08/2020 12:20 AM            ASSESSMENT:  85 y.o. female 1 Day Post-Op s/p ORIF R PPx distal femur fx    PLAN:  - Weightbearing: TDWB RLE  - PT/OT: ordered; recs post-op pending  - DVT prophylaxis: per primary, OK to start this am.  Needs 3 - 4 weeks of ppx  - Antibiotics: Ancef 24H post-op  - Drain: none  - Dressing: located RLE, plan to change POD 2 vs 3  - Labs: as above, Hgb 8.1. Acute blood loss anemia.   - Diet: OK to eat from ortho perspective after OR.  - Nursing instructions: OOB all meals  - Dispo: pending Abx, placement  - Follow-up: will see back in Dr. Odessa Fleming clinic in 2 wks. Order placed in Epic.    --  Keitha Butte, MD  Resident, PGY-5  Department of Orthopaedics  Pager [phone]      Please notify us of impending discharge so that we can review discharge instructions and arrange follow up with the call center.  The patient's condition and plan of care was discussed with me.  The note above was personally reviewed  and appended before signature.    Peyton Bottoms MD  Assistant Professor Orthopaedic Surgery  Pager [phone]

## 2020-05-08 NOTE — Nurses Notes (Signed)
Unable to collect urine specimen, patient missed hat. Will collect on next void.

## 2020-05-08 NOTE — Nurses Notes (Signed)
Patient arrived to 7ne bed 20, A&O x4, patient c/o trouble with her throat, said she has had issues since surgery, says she feels like "liquid is caught" in her throat," patient had small amount of emesis. patient's BP is running 1884/78 manually, patient denies chest pain, says that her pain in her leg is a 5/10 and is "feeling better." Paged and notified MD Karolee Ohs of MH6, MD asked to have BP rechecked once patient pain has resided some. Will continue to monitor.
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
## 2020-05-08 NOTE — Respiratory Therapy (Signed)
Patient is currently sleeping on RA, sats WNL. BBS are clear. No tx's ordered at this time. No respiratory distress noted. RT will continue to monitor.

## 2020-05-09 ENCOUNTER — Inpatient Hospital Stay (HOSPITAL_COMMUNITY): Payer: Medicare Other

## 2020-05-09 ENCOUNTER — Encounter (INDEPENDENT_AMBULATORY_CARE_PROVIDER_SITE_OTHER): Payer: Self-pay | Admitting: Surgery

## 2020-05-09 DIAGNOSIS — N2889 Other specified disorders of kidney and ureter: Secondary | ICD-10-CM

## 2020-05-09 DIAGNOSIS — N261 Atrophy of kidney (terminal): Secondary | ICD-10-CM

## 2020-05-09 DIAGNOSIS — I348 Other nonrheumatic mitral valve disorders: Secondary | ICD-10-CM

## 2020-05-09 DIAGNOSIS — S72001A Fracture of unspecified part of neck of right femur, initial encounter for closed fracture: Secondary | ICD-10-CM

## 2020-05-09 DIAGNOSIS — R296 Repeated falls: Secondary | ICD-10-CM

## 2020-05-09 DIAGNOSIS — I361 Nonrheumatic tricuspid (valve) insufficiency: Secondary | ICD-10-CM

## 2020-05-09 DIAGNOSIS — E11628 Type 2 diabetes mellitus with other skin complications: Secondary | ICD-10-CM

## 2020-05-09 DIAGNOSIS — Z950 Presence of cardiac pacemaker: Secondary | ICD-10-CM

## 2020-05-09 DIAGNOSIS — I129 Hypertensive chronic kidney disease with stage 1 through stage 4 chronic kidney disease, or unspecified chronic kidney disease: Secondary | ICD-10-CM

## 2020-05-09 DIAGNOSIS — T24019A Burn of unspecified degree of unspecified thigh, initial encounter: Secondary | ICD-10-CM

## 2020-05-09 DIAGNOSIS — T25332D Burn of third degree of left toe(s) (nail), subsequent encounter: Secondary | ICD-10-CM

## 2020-05-09 DIAGNOSIS — S7002XA Contusion of left hip, initial encounter: Secondary | ICD-10-CM

## 2020-05-09 DIAGNOSIS — D649 Anemia, unspecified: Secondary | ICD-10-CM

## 2020-05-09 DIAGNOSIS — E1122 Type 2 diabetes mellitus with diabetic chronic kidney disease: Secondary | ICD-10-CM

## 2020-05-09 DIAGNOSIS — M069 Rheumatoid arthritis, unspecified: Secondary | ICD-10-CM

## 2020-05-09 DIAGNOSIS — N189 Chronic kidney disease, unspecified: Secondary | ICD-10-CM

## 2020-05-09 DIAGNOSIS — N281 Cyst of kidney, acquired: Secondary | ICD-10-CM

## 2020-05-09 DIAGNOSIS — D72829 Elevated white blood cell count, unspecified: Secondary | ICD-10-CM

## 2020-05-09 DIAGNOSIS — T25322D Burn of third degree of left foot, subsequent encounter: Secondary | ICD-10-CM

## 2020-05-09 DIAGNOSIS — R52 Pain, unspecified: Secondary | ICD-10-CM

## 2020-05-09 DIAGNOSIS — M79604 Pain in right leg: Secondary | ICD-10-CM

## 2020-05-09 DIAGNOSIS — N179 Acute kidney failure, unspecified: Secondary | ICD-10-CM

## 2020-05-09 DIAGNOSIS — I4891 Unspecified atrial fibrillation: Secondary | ICD-10-CM

## 2020-05-09 DIAGNOSIS — M9711XA Periprosthetic fracture around internal prosthetic right knee joint, initial encounter: Secondary | ICD-10-CM

## 2020-05-09 DIAGNOSIS — T24392D Burn of third degree of multiple sites of left lower limb, except ankle and foot, subsequent encounter: Secondary | ICD-10-CM

## 2020-05-09 DIAGNOSIS — J9811 Atelectasis: Secondary | ICD-10-CM

## 2020-05-09 DIAGNOSIS — T25022A Burn of unspecified degree of left foot, initial encounter: Secondary | ICD-10-CM

## 2020-05-09 DIAGNOSIS — S72401A Unspecified fracture of lower end of right femur, initial encounter for closed fracture: Secondary | ICD-10-CM

## 2020-05-09 DIAGNOSIS — E785 Hyperlipidemia, unspecified: Secondary | ICD-10-CM

## 2020-05-09 LAB — TRANSTHORACIC ECHOCARDIOGRAM - ADULT
AV LVOT peak gradient: 2 mmHg
AV mean gradient: 2 mmHg
Ao VTI: 20 cm
Ao root annulus: 3.1 cm
Ao root annulus: 3.1 cm
Aortic Valve Area by Continuity of VTI: 2.47 cm2
E wave decelartion time: 166 ms
E/A ratio: 2.2
E/E' ratio: 9.8
Inferior Vena Cava Diameter: 2.4 cm
Inferior Vena Cava Diameter: 2.4 cm
LA volume: 39.1 cm3
LVOT diameter: 2.2 cm
LVOT stroke volume: 49 cm3
Left Ventricular Outflow Tract Peak Velocity: 63.3 cm/s
MV Peak A Vel: 52 cm/s
MV Peak E Vel: 113 cm/s
MV stenosis pressure 1/2 time: 49 ms
Mr max vel: 491 cm/s
Pulmonic Valve Acceleration Time: 67 ms
RVDD: 3.9 cm
RVDD: 3.9 cm
TDI: 11.5 cm/s
TDI: 11.5 cm/s

## 2020-05-09 LAB — MANUAL DIFF AND MORPHOLOGY-SYSMEX
BASOPHIL #: <0.1 10*3/uL (ref <=0.20)
BASOPHIL %: 0 %
EOSINOPHIL #: <0.1 10*3/uL (ref <=0.50)
EOSINOPHIL %: 0 %
LYMPHOCYTE #: 1.17 10*3/uL (ref 1.00–4.80)
LYMPHOCYTE %: 8 %
MONOCYTE #: 1.31 10*3/uL — ABNORMAL HIGH (ref 0.20–1.10)
MONOCYTE %: 9 %
NEUTROPHIL #: 12.12 10*3/uL — ABNORMAL HIGH (ref 1.50–7.70)
NEUTROPHIL %: 83 %
NRBC FROM MANUAL DIFF: 3 per 100 WBC

## 2020-05-09 LAB — CBC WITH DIFF
HCT: 25.2 % — ABNORMAL LOW (ref 34.8–46.0)
HGB: 8.3 g/dL — ABNORMAL LOW (ref 11.5–16.0)
MCH: 30.5 pg (ref 26.0–32.0)
MCHC: 32.9 g/dL (ref 31.0–35.5)
MCV: 92.6 fL (ref 78.0–100.0)
MPV: 10.6 fL (ref 8.7–12.5)
PLATELETS: 234 10*3/uL (ref 150–400)
RBC: 2.72 10*6/uL — ABNORMAL LOW (ref 3.85–5.22)
RDW-CV: 15.8 % — ABNORMAL HIGH (ref 11.5–15.5)
WBC: 14.6 10*3/uL — ABNORMAL HIGH (ref 3.7–11.0)

## 2020-05-09 LAB — BASIC METABOLIC PANEL
ANION GAP: 16 mmol/L — ABNORMAL HIGH (ref 4–13)
BUN/CREA RATIO: 24 — ABNORMAL HIGH (ref 6–22)
BUN: 44 mg/dL — ABNORMAL HIGH (ref 8–25)
CALCIUM: 8.1 mg/dL — ABNORMAL LOW (ref 8.8–10.2)
CHLORIDE: 110 mmol/L (ref 96–111)
CO2 TOTAL: 14 mmol/L — ABNORMAL LOW (ref 23–31)
CREATININE: 1.85 mg/dL — ABNORMAL HIGH (ref 0.60–1.05)
ESTIMATED GFR: 25 mL/min/BSA — ABNORMAL LOW (ref >=60)
GLUCOSE: 85 mg/dL (ref 65–125)
POTASSIUM: 4 mmol/L (ref 3.5–5.1)
SODIUM: 140 mmol/L (ref 136–145)

## 2020-05-09 LAB — H & H
HCT: 27.5 % — ABNORMAL LOW (ref 34.8–46.0)
HCT: 24.2 % — ABNORMAL LOW (ref 34.8–46.0)
HGB: 9 g/dL — ABNORMAL LOW (ref 11.5–16.0)
HGB: 8 g/dL — ABNORMAL LOW (ref 11.5–16.0)

## 2020-05-09 LAB — HGA1C (HEMOGLOBIN A1C WITH EST AVG GLUCOSE)
ESTIMATED AVERAGE GLUCOSE: 105 mg/dL
HEMOGLOBIN A1C: 5.3 % (ref 4.0–5.6)

## 2020-05-09 LAB — POC BLOOD GLUCOSE (RESULTS)
GLUCOSE, POC: 91 mg/dl (ref 70–105)
GLUCOSE, POC: 100 mg/dl (ref 70–105)
GLUCOSE, POC: 107 mg/dl — ABNORMAL HIGH (ref 70–105)
GLUCOSE, POC: 121 mg/dl — ABNORMAL HIGH (ref 70–105)

## 2020-05-09 MED ORDER — POLYETHYLENE GLYCOL 3350 17 GRAM ORAL POWDER PACKET
17.0000 g | Freq: Every day | ORAL | Status: DC
Start: 2020-05-09 — End: 2020-05-10
  Administered 2020-05-09 – 2020-05-10 (×2): 17 g via ORAL
  Filled 2020-05-09 (×2): qty 1

## 2020-05-09 MED ORDER — METOPROLOL TARTRATE 5 MG/5 ML INTRAVENOUS SOLUTION
5.0000 mg | Freq: Four times a day (QID) | INTRAVENOUS | Status: DC | PRN
Start: 2020-05-09 — End: 2020-05-10
  Administered 2020-05-09: 5 mg via INTRAVENOUS
  Filled 2020-05-09: qty 5

## 2020-05-09 NOTE — Care Plan (Signed)
Theresa Greene  Occupational Therapy Initial Evaluation    Patient Name: Theresa Greene  Date of Birth: 06-01-1935  Height: Height: 168.9 cm (5' 6.5")  Weight: Weight: 60.5 kg (133 lb 6.1 oz)  Room/Bed: 20/A  Payor: MEDICARE / Plan: MEDICARE PART A AND B / Product Type: Medicare /     Assessment:   Theresa Greene tolerated OT evaluation fair for first time OOB. She was able to complete functional transfers with minA x2, but had difficulty maintaining TTWB restriction on R LE. She currently requries assistance for all dynamic and LB ADL tasks, but was able to complete self peri-hygiene from seated level on BSC. From OT perspective, pt is unsafe to d/c home due to poor strength, endurance, balance, fine-motor coordination and overall safety awareness limiting pt ability to care for self. Would require continued rehab in SNF setting.    Discharge Needs:   Equipment Recommendation: none anticipated  Discharge Disposition: skilled nursing facility    JUSTIFICATION OF DISCHARGE RECOMMENDATION   Based on current diagnosis, functional performance prior to admission, and current functional performance, this patient requires continued OT services in skilled nursing facility  in order to achieve significant functional improvements.    Plan:   Current Intervention: balance training, bed mobility training, endurance training, ADL retraining, IADL retraining, ROM (range of motion), strengthening, transfer training    To provide Occupational therapy services 1x/day, minimum of 2x/week, until discharge.       The risks/benefits of therapy have been discussed with the patient/caregiver and he/she is in agreement with the established plan of care.       Subjective & Objective        05/09/20 1014   Therapist Pager   OT Assigned/ Pager # Theresa Greene [phone]   Rehab Session   Document Type evaluation   Total OT Minutes: 16   Patient Effort good   Symptoms Noted During/After Treatment fatigue;increased pain    General Information   Patient Profile Reviewed yes   Onset of Illness/Injury or Date of Surgery 05/07/20   Pertinent History of Current Functional Problem Theresa Greene is a 85 y.o. White female with a PMHx pertinent for CAD s/p PCI, Afib s/p pacemaker (Theresa Greene), possible DM previously on metformin, HTN and HLD who initially presented on 05/07/2020 with fall found to have a right hip fracture s/p ORIF. Post operatively was monitored in the SICU. Consulted for transfer to [place] [facility];Telemetry   Respiratory Status room air   Existing Precautions/Restrictions fall precautions;full code;weight bearing restriction   Pre Treatment Status   Pre Treatment Patient Status Patient supine in bed;Call light within reach;Telephone within reach;Sitter select activated;Nurse approved session   Support Present Pre Treatment  None   Communication Pre Treatment  Nurse   Mutuality/Individual Preferences   Individualized Care Needs OOB with FWW x2 to/from chair//BSC only. Would need Theresa Greene for distance due to TDWB   Plan of Care Reviewed With patient   Living Environment   Lives With spouse   Living Arrangements house   Home Accessibility stairs to enter home   [place] [name] 1 STE home with tub shower and shower seat. Spouse is currently undergoing cancer treatment and unable to provide physical assist for pt. She reports her adult daughter is flying in from out of state to stay with pt/spouse and provide assistance.  Home Main Entrance   Number of Stairs, Main Entrance one   Functional Level Prior   Ambulation 1 - assistive equipment   Transferring 1 - assistive equipment   Toileting 0 - independent   Bathing 1 - assistive equipment   Dressing 0 - independent   Eating 0 - independent   Communication 0 - understands/communicates without difficulty   Swallowing 0-->swallows foods/liquids without difficulty    Prior Functional Level Comment Pt uses cane for ambulation and shower chair/hand-held shower head for seated shower. She is normally able to complete her ADL and iADL tasks unassisted, but reports her/husband have been home bound for last several weeks, ordering groceries and watching weekly mass on ipad.   Self-Care   Usual Activity Tolerance moderate   Current Activity Tolerance fair   Equipment Currently Used at Home commode;walker, rolling;shower chair   Vital Signs   Vitals Comment Pt HR in 120s during session.   Pain Assessment   Pretreatment Pain Rating 0/10 - no pain   Posttreatment Pain Rating 7/10   Pre/Posttreatment Pain Comment R hip with movement; medicated prio rto session   Coping/Psychosocial Response Interventions   Plan Of Care Reviewed With patient   Cognitive Assessment/Interventions   Behavior/Mood Observations behavior appropriate to situation, WNL/WFL   Orientation Status oriented x 4   Attention WNL/WFL   Follows Commands WFL   Comment Cooperative and pleasant with insight into deficits, but tearful regarding situation and her & her husband's declining health.   RUE Assessment   RUE Assessment WFL- Within Functional Limits  (4/5 grossly)   LUE Assessment   LUE Assessment WFL- Within Functional Limits  (4/5 grossly)   Musculoskeletal   LLE Weight-Bearing Status weight-bearing as tolerated   RLE Weight-Bearing Status toe touch weight-bearing   Mobility Assessment/Training   Mobility Comment See transfer section. Ambulation limited by fatigue with transfer to chair.   Bed Mobility Assessment/Treatment   Supine-Sit Independence minimum assist (75% patient effort)   Safety Issues decreased use of arms for pushing/pulling   Impairments pain;postural control impaired;strength decreased;balance impaired;endurance;flexibility decreased   Comment MinA to unweight R LE and pull for final trunk elevation. She is able to maintain EOB sitting balance well without physical assistance.   Transfer  Assessment/Treatment   Sit-Stand Independence minimum assist (75% patient effort)   Stand-Sit Independence minimum assist (75% patient effort)   Sit-Stand-Sit, Assist Device walker, front wheeled   Bed-Chair Independence minimum assist (75% patient effort)   Bed-Chair-Bed Assist Device walker, front wheeled   Toilet Transfer Independence minimum assist (75% patient effort)   Toilet Transfer Assist Device walker, front wheeled   Transfer Safety Issues weight-shifting ability decreased;step length decreased;sequencing ability decreased;balance decreased during turns   Transfer Impairments balance impaired;strength decreased;pain;flexibility decreased;endurance;coordination impaired   Transfer Comment Pt with difficulty maintaining TDWB R LE with piviot transfers to/from chair.   Lower Body Dressing Assessment/Training   Position sitting   DRESSING ASSESSED [place]   Independence Level  dependent (less than 25% patient effort)   Impairments ROM decreased;strength decreased;pain;flexibility decreased;balance impaired;activity tolerance impaired;coordination impaired   Comment May required long-handled AE to improve functional reach due to pain.   Toileting Assessment/Training   Assistive Devices bedside commode   Position sitting   TOILETING ASSESSED Perineal hygiene   Independence Level  set up required   Impairments activity tolerance impaired;balance impaired;flexibility decreased;pain;strength decreased   Comment Able to complete peri hygiene at seated level, but required minA for static standing balance to allow for switch  of BSC and recliner chair.   Grooming Assessment/Training   Position sitting   Comment via wipe to wash hands post-toileting   Balance Skill Training   Comment wioth FWW   Sitting Balance: Static fair balance   Sitting, Dynamic (Balance) fair - balance   Sit-to-Stand Balance poor + balance   Standing Balance: Static poor + balance   Standing Balance: Dynamic poor balance   Post Treatment Status    Post Treatment Patient Status Patient sitting in bedside chair or w/c;Call light within reach;Telephone within reach;Engineer, petroleum Nurse   Care Plan Goals   OT Rehab Goals Grooming Goal;LB Dressing Goal;Toileting Goal;Transfer Training Goal 2;Bed Mobility Goal;Cognition Goal   Bed Mobility Goal   Bed Mobility Goal, Date Established 05/09/20   Bed Mobility Goal, Time to Achieve by discharge   Bed Mobility Goal, Activity Type all bed mobility activities   Bed Mobility Goal, Independence Level contact guard assist   Bed Mobility Goal, Assistive Device bed rails   Cognition Goal   Cognition Goal, Date Established 05/09/20   Cognition Goal, Time to Achieve by discharge   Cognition Goal, Activity Type Pt will follow multi-step verbal commands for familiar self-care tasks without redirection required.   Cognition Goal, Independence Level independent   Grooming Goal   Grooming Goal, Date Established 05/09/20   Grooming Goal, Time to Achieve by discharge   Grooming Goal, Activity Type all grooming tasks   Grooming Goal, Independence  contact guard assist   Grooming Goal, Position standing   Grooming Goal, Additional Goal >2 minutes at sink without LOB   LB Dressing Goal   LB Dressing Goal, Date Established 05/09/20   LB Dressing Goal, Time to Achieve by discharge   LB Dressing Goal, Activity Type all lower body dressing tasks   LB Dressing Goal, Independence Level modified independence   LB Dressing Goal, Additional Goal via AE PRN   Toileting Goal   Toileting Goal, Date Established 05/09/20   Toileting Goal, Time to Achieve by discharge   Toileting Goal, Activity Type all toileting tasks   Toileting Goal, Independence Level stand-by assistance   Transfer Training Goal 2   Transfer Training Goal, Date Established 05/09/20   Transfer Training Goal, Time to Achieve by discharge   Transfer Training Goal, Activity Type  bed-to-chair/chair-to-bed;toilet   Transfer Training Goal, Independence Level contact guard assist   Transfer Training Goal, Assist Device walker, rolling   Transfer Training Goal, Additional Goal maintaining TDWB well   Planned Therapy Interventions, OT Eval   Planned Therapy Interventions balance training;bed mobility training;endurance training;ADL retraining;IADL retraining;ROM (range of motion);strengthening;transfer training   Clinical Impression   Functional Level at Time of Session Ms. Koestner tolerated OT evaluation fair for first time OOB. She was able to complete functional transfers with minA x2, but had difficulty maintaining TTWB restriction on R LE. She currently requries assistance for all dynamic and LB ADL tasks, but was able to complete self peri-hygiene from seated level on BSC. From OT perspective, pt is unsafe to d/c home due to poor strength, endurance, balance, fine-motor coordination and overall safety awareness limiting pt ability to care for self. Would require continued rehab in SNF setting.   Criteria for Skilled Therapeutic Interventions Met (OT) yes   Rehab Potential good, to achieve stated therapy goals   Therapy Frequency 1x/day;minimum of 2x/week   Predicted Duration of Therapy until discharge   Anticipated Equipment Needs  at Discharge none anticipated   Anticipated Discharge Disposition skilled nursing facility   Highest level of Mobility score   Exercise/Activity Level Performed 4- Transferred to chair/commode   Evaluation Complexity Justification   Occupational Profile Review Expanded review   Performance Deficits Coordination;Endurance;Balance;Mobility;Pain;Strength;Safety awareness;5+ deficits   Clinical Decision Making Moderate analytic complexity   Evaluation Complexity Moderate       Therapist:   Luella Cook, OT   Pager #: [phone]

## 2020-05-09 NOTE — Care Plan (Signed)
[facility]  Speech Therapy Initial Swallow Evaluation     Patient Name: Theresa Greene  Date of Birth: [birth year]  Height: Height: 168.9 cm (5' 6.5")  Weight: Weight: 60.5 kg (133 lb 6.1 oz)  Room/Bed: 20/A  Payor: MEDICARE / Plan: MEDICARE PART A AND B / Product Type: Medicare /      Assessment:  Functional Level At Time Of Eval: Oropharyngeal swallow judged to be [facility] for a full liquid diet with level 0 liquid: thin. Assessment limited as pt regurgitated entire presentation of applesauce; deferred further trials. Pt reported she mostly eats "mashed" foods at home 2/2 h/o esophageal dysphagia. Pt also reported that she has had esophageal dialations ~every 2 years and is due for another one. Suspect dysphagia is esophageal in nature but SLP will follow for continued swallow assessment as warranted. Recommend consult to GI.    SLP Swallowing Diagnosis: esophageal dysfunction  SLP Diet Recommendation: thin liquids, other (see comments) (full liquid diet)  Aspiration Precautions: patient is independent, no specific recommendations  Signs/Symptoms of Aspiration Noted (Swallowing): none           Discharge Needs:  Discharge Disposition: home (will need Justification of D/C recommendation if placement is required.)    JUSTIFICATION OF DISCHARGE RECOMMENDATION   Based on current diagnosis, functional performance prior to admission, and current functional performance, this patient requires continued SLP services in TBD home in order to achieve significant functional improvements for Swallowing.       Plan:  To provide Speech Therapy services 2-3 times/wk for duration of until discharge.     The risks/benefits of therapy have been discussed with the patient/caregiver and he/she is in agreement with the established plan of care.         Subjective & Objective        05/09/20 0926   Therapist Pager   SLP Pager Romelle Starcher [phone]   Rehab Session   Document Type evaluation   Total SLP Minutes:  25   General Information   Patient Profile Reviewed yes   General Observations of Patient Pt resting in bed. Hoarse vocal quality noted which pt reports is new following recent surgery   Pertinent History of Current Functional Problem Theresa Greene is a 85 y.o. White female with a PMHx pertinent for CAD s/p PCI, Afib s/p pacemaker (st jude), possible DM (previously on metformin, HTN and HLD who initially presented on 05/07/2020 with fall found to have a right hip fracture s/p ORIF. Post operatively was monitored in the SICU. Consulted for transfer to Medicine   Mutuality/Individual Preferences   Individualized Care Needs full liquid diet   Functional Status Prior   Eating 0 - independent   Communication 0 - understands/communicates without difficulty   Swallowing 2 - difficulty swallowing liquids/foods   Prior Functional Level Comment pt reports h/o esophagea dysphagia with h/o dialations every 2 years; pt reports she is due for her next dilation   Coping/Psychosocial   Observed Emotional State calm;cooperative   Cognitive   Cognitive/Neuro/Behavioral WDL WDL;speech;X   Level of Consciousness alert   Orientation oriented x 4   Speech hoarse;spontaneous;logical   Oral Motor Structure and Function    Additional Documentation Oral Motor Structure/Functional Assessment (Group)   Oral Motor Structure/Functional Assessment   Dentition (Oral Motor) upper and lower dentures   Mucosal Quality (Oral Motor Assessment) good   Oral Musculature (Oral Motor Assessment) WFL   Secretion Management (Oral Motor Assessment) WFL   Volitional  Cough (Oral Motor Assessment) no issues initiating volitional cough   Non Instrumental/Clinical Swallow (NIS)   Additional Documentation Thin Liquid Trial (NIS) (Group);Puree Texture Trial (NIS) (Group)   Thin Liquid Trial (NIS)   Laryngeal Observations, Thin Liquid (NIS) laryngeal function intact, normal movement bilaterally   Mode of Presentation, Thin Liquid (NIS) self-fed;cup   Oral Phase  Results, Thin Liquid (NIS) intact oral phase without signs of dysfunction   Pharyngeal Phase Results, Thin Liquid (NIS) safe swallow, no signs/symptoms of aspiration or penetration   Volume Presented in mL, Thin Liquid (NIS) patient controlled volumes   Puree Texture Trial (NIS)   Comment, Puree (NIS) appearance of entirety of applesauce presentation regurgitated   Laryngeal Observations, Puree (NIS) laryngeal function intact, normal movement bilaterally   Mode of Presentation, Puree (NIS) self-fed;spoon   Oral Phase Results, Puree (NIS) intact oral phase without signs of dysfunction   Pharyngeal Phase Results, Puree (NIS) safe swallow, no signs/symptoms of aspiration or penetration   Swallowing Concerns Noted, Puree (NIS) regurgitation of food/liquids noted   Volume Presented in mL, Puree (NIS) 5 mL   Swallowing Clinical Impression   SLP Swallowing Diagnosis esophageal dysfunction   Rehab Potential/Prognosis (Swallow Eval) adequate, monitor progress closely   Functional Level At Time Of Eval Oropharyngeal swallow judged to be [facility] for a full liquid diet with level 0 liquid: thin. Assessment limited as pt regurgitated entire presentation of applesauce; deferred further trials. Pt reported she mostly eats "mashed" foods at home 2/2 h/o esophageal dysphagia Pt also reported that she has had esophageal dialations ~every 2 years and is due for another one. Suspect dysphagia is esophageal in nature but SLP will follow for continued swallow assessment as warranted. Recommend consult to GI.   Criteria for Skilled Merri Brunette Met demonstrates skilled criteria for intervention   Therapy Frequency 2-3 times/wk   Predicted Duration Therapy Interv (days) until discharge   Expected Duration Therapy Session - minutes 15-30 minutes   SLP Diet Recommendation thin liquids;other (see comments)  (full liquid diet)   Recommended Feeding/Eating Techniques (Swallow Eval) patient is independent, no specific recommendations    Signs/Symptoms of Aspiration Noted (Swallowing) none   Anticipated Discharge Disposition home   Plan of care reviewed with: PT   Dysphagia Goals, SLP   Date Established (Dysphagia Goal, SLP) 05/09/20   Time Frame (Dysphagia Goal, SLP) by discharge   Dysphagia Goal, Oral Nutritional Level Goal no signs/symptoms of aspiration present;safely tolerates recommended diet texture;safely tolerates recommended liquid viscosity        Therapist:  Lynn Ito, SLP   Pager #: [phone]  Phone #: [phone]

## 2020-05-09 NOTE — Nurses Notes (Addendum)
Pt's HR 132 at this time. Paged service to make aware. Will continue to monitor.    MD returned page stating to give morning dose of Metoprolol and then recheck pt. Will administer per orders. Will continue to monitor pt.
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
## 2020-05-09 NOTE — Nurses Notes (Signed)
Patient returned to 7NE Bed 20 via bed by transport.

## 2020-05-09 NOTE — Respiratory Therapy (Signed)
Pulmonary Evaluation     Pt is restin gon RA and no signs of distress are noted. BBS are clear. Sats are WNL. Will continue to monitor.
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
## 2020-05-09 NOTE — Care Management Notes (Signed)
[facility] Management Initial Evaluation    Patient Name: Theresa Greene  Date of Birth: 1935-02-25  Sex: female  Date/Time of Admission: 05/07/2020  3:46 AM  Room/Bed: 20/A  Payor: MEDICARE / Plan: MEDICARE PART A AND B / Product Type: Medicare /   Primary Care Providers:  Hill-Reinert, Earnest Bailey, DO, DO (General)    Pharmacy Info:   Preferred [facility] [id number] - [place], [place] [facility])    [place] [address]    Phone: [phone] Fax: [phone]    Hours: Not open 24 hours          Emergency Contact Info:   Extended Emergency Contact Information  Primary Emergency Contact: Kleven,JAMES  Address: [address], [place] [address] [place] Phone: [phone]  Work Phone: [phone]  Mobile Phone: [phone]  Relation: Husband  Secondary Emergency Contact: Joelyn Oms  Mobile Phone: [phone]  Relation: Daughter  Interpreter needed? No    History:   Wm Fruchter is a 85 y.o., female, admitted from fall.    Height/Weight: 168.9 cm (5' 6.5") / 60.5 kg (133 lb 6.1 oz)     LOS: 2 days   Admitting Diagnosis: Fall [W19.XXXA]    Assessment:      05/09/20 1154   Assessment Details   Assessment Type Admission   Date of Care Management Update 05/09/20   Date of Next DCP Update 05/12/20   Readmission   Is this a readmission? No   Care Management Plan   Discharge Planning Status initial meeting   Projected Discharge Date 05/12/20   Discharge plan discussed with: Patient   CM will evaluate for rehabilitation potential yes   Discharge Needs Assessment   Was Referral sent to CPS/CYS? No   Was referral sent to APS? No   Equipment Currently Used at Home walker, front wheeled;cane, straight;other (see comments);commode  (bedside commode)   Equipment Needed After Discharge none   Discharge Facility/Level of Care Needs Home vs SNF;SNF vs Rehab   Transportation Available car;family or  friend will provide  (Husband- Jeneen Rinks or Daughter-[name] will transport)   Referral Information   Admission Type inpatient   Address Verified verified-no changes   Arrived From other (see comments);another healthcare institution, not defined  (Outside facility-[facility])   Insurance Verified verified-no change   ADVANCE DIRECTIVES   Does the Patient have an Advance Directive? Yes, Patient Does Have Advance Directive for Healthcare Treatment   Type of Advance Directive Completed Medical Living Will;Medical Power of Attorney   Copy of Advance Directives in Chart? 6   Name of MPOA or [place]   Phone Number of [place] or Healthcare Surrogate [phone] (work), [phone] (mobile)   Patient Requests Assistance in Having Advance Directive Notarized. N/A   Employment/Financial   Patient has Prescription Coverage?  Yes        Name of Insurance Coverage for Medications Medicare/AARP   Financial Concerns none   Living Environment   Select an age group to open "lives with" row.  Adult   Lives With spouse  (Husband-James)   Living Arrangements house   Able to Return to Prior Arrangements yes   [place] Accessibility stairs to enter home;no concerns   Home Main Entrance   Number of [place], Main  Entrance one       Discharge Plan:  Home vs SNF, SNF vs Rehab     CM alerted for IP consult. MSW met with pt bedside to complete initial assessment and discuss discharge plans. Pt verified address, insurance, pharmacy, and PCP. Pt verified prescription coverage with insurance. Pt has DME (FWW, cane, and bedside commode) available at home. Pt declines any past or active [place] coming into home. Pt reports having active MPOA and Living Will, MSW requested copy from pt. Husband Jeneen Rinks) or Daughter [name]) will provide transportation for discharge.     The patient will continue to be evaluated for developing discharge needs.     Case Manager: Manuela Neptune, [place]  Phone: [phone]

## 2020-05-09 NOTE — Nurses Notes (Signed)
Pt's HR 121 per tele, this RN administered PRN metoprolol IV per MD's orders. Will continue to monitor pt and pt's HR.

## 2020-05-09 NOTE — Nurses Notes (Signed)
Patient to X-ray via bed by transport in stable condition.
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
## 2020-05-09 NOTE — Consults (Signed)
[place] Department of Orthopaedics  Service: Orthopaedic Trauma/Call  Attending: Graciela Husbands  Progress Note  05/09/2020    Name: Theresa Greene  DOB: June 24, 1935  MRN: [id number]    RECENT ORTHO SURGERY:  - ORIF R PPx distal femur fx = 9/25 (Attending Surgeon - Graciela Husbands)    SUBJECTIVE:  85 y.o. female resting in bed on the floor Maintaining airway. Pain controlled. Minimal pain, has not been OOB yet..    OBJECTIVE:  AF, VSS   BP 124/78    Pulse 73    Temp 37.8 C (100 F)    Resp 16    Ht 1.689 m (5' 6.5")    Wt 60.5 kg (133 lb 6.1 oz)    SpO2 94%    BMI 21.21 kg/m     GEN - NAD,  resting in bed    RLE:  -- Inspection: Dressing clean, dry, and intact. Changed today. Incision appears well  -- Sensation: SILT dorsal and plantar foot  --Motor:  + ankle dorsiflexion and plantarflexion, + EHL/FHL.  --CV: 2+ DP/PT pulse.  BCR < 2 sec in all toes, WWP    Hemogram   Lab Results   Component Value Date/Time    WBC 16.4 (H) 05/08/2020 12:20 AM    HGB 8.4 (L) 05/08/2020 10:16 PM    HCT 25.6 (L) 05/08/2020 10:16 PM    PLTCNT 228 05/08/2020 12:20 AM    RBC 2.65 (L) 05/08/2020 12:20 AM    MCV 92.5 05/08/2020 12:20 AM    MCHC 33.5 05/08/2020 12:20 AM    MCH 30.9 05/08/2020 12:20 AM    MPV 11.0 05/08/2020 12:20 AM        Basic Metabolic Profile    Lab Results   Component Value Date/Time    SODIUM 140 05/08/2020 12:41 PM    POTASSIUM 4.0 05/08/2020 12:41 PM    CHLORIDE 114 (H) 05/08/2020 12:41 PM    CO2 13 (L) 05/08/2020 12:41 PM    ANIONGAP 13 05/08/2020 12:41 PM    Lab Results   Component Value Date/Time    BUN 43 (H) 05/08/2020 12:41 PM    CREATININE 1.94 (H) 05/08/2020 12:41 PM    GLUCOSENF 81 05/08/2020 12:41 PM            ASSESSMENT:  85 y.o. female 2 Days Post-Op s/p ORIF R PPx distal femur fx    PLAN:  - Weightbearing: TDWB RLE  - PT/OT: ordered; recs pending. Needs to work with PT today and be OOB.  - DVT prophylaxis: per primary. Needs 3 - 4 weeks of ppx  - Antibiotics: s/p Ancef 24H post-op  - Drain: none  - Dressing: located RLE,  changed today  - Labs: as above, Hgb 8.1 -> 8.4.  - Nursing instructions: OOB all meals  - Dispo: pending  placement  - Follow-up: will see back in Dr. Odessa Fleming clinic in 2 wks. Order placed in Epic. Please notify us of impending discharge so that we can review discharge instructions and arrange follow up with the call center.  - Please page Drs. Carlynn Herald or Lynnell Catalan with questions during the day.    --  Keitha Butte, MD  Resident, PGY-5  Department of Orthopaedics  Pager [phone]      Please notify us of impending discharge so that we can review discharge instructions and arrange follow up with the call center.  I personally rounded on the patient.  I examined the patient, reviewed vital signs, and reviewed laboratory data.  The above note has been reviewed and I concur with its findings.  Peyton Bottoms MD  Assistant Professor Orthopaedic Surgery  Pager [phone]

## 2020-05-09 NOTE — Nurses Notes (Addendum)
Received call from telemetry that pt has went into Afib ranging from 105-115. Paged service to make aware. Awaiting return page or orders to be placed accordingly. Will continue to monitor patient.    0800 MD returned page. MD to place orders accordingly. Will continue to monitor.

## 2020-05-10 ENCOUNTER — Inpatient Hospital Stay (HOSPITAL_COMMUNITY): Payer: Medicare Other

## 2020-05-10 ENCOUNTER — Ambulatory Visit (INDEPENDENT_AMBULATORY_CARE_PROVIDER_SITE_OTHER): Payer: Self-pay | Admitting: Internal Medicine

## 2020-05-10 DIAGNOSIS — M25561 Pain in right knee: Secondary | ICD-10-CM

## 2020-05-10 DIAGNOSIS — M25551 Pain in right hip: Secondary | ICD-10-CM

## 2020-05-10 DIAGNOSIS — W1812XA Fall from or off toilet with subsequent striking against object, initial encounter: Secondary | ICD-10-CM

## 2020-05-10 DIAGNOSIS — J9811 Atelectasis: Secondary | ICD-10-CM

## 2020-05-10 DIAGNOSIS — S7012XA Contusion of left thigh, initial encounter: Secondary | ICD-10-CM

## 2020-05-10 LAB — CBC WITH DIFF
BASOPHIL #: <0.1 10*3/uL (ref <=0.20)
BASOPHIL %: 0 %
EOSINOPHIL #: <0.1 10*3/uL (ref <=0.50)
EOSINOPHIL %: 1 %
HCT: 22.9 % — ABNORMAL LOW (ref 34.8–46.0)
HGB: 7.4 g/dL — ABNORMAL LOW (ref 11.5–16.0)
IMMATURE GRANULOCYTE #: 0.1 10*3/uL — ABNORMAL HIGH (ref <0.10)
IMMATURE GRANULOCYTE %: 1 % (ref 0–1)
LYMPHOCYTE #: 1.72 10*3/uL (ref 1.00–4.80)
LYMPHOCYTE %: 15 %
MCH: 30.1 pg (ref 26.0–32.0)
MCHC: 32.3 g/dL (ref 31.0–35.5)
MCV: 93.1 fL (ref 78.0–100.0)
MONOCYTE #: 1.69 10*3/uL — ABNORMAL HIGH (ref 0.20–1.10)
MONOCYTE %: 15 %
MPV: 10.8 fL (ref 8.7–12.5)
NEUTROPHIL #: 7.7 10*3/uL (ref 1.50–7.70)
NEUTROPHIL %: 68 %
PLATELETS: 239 10*3/uL (ref 150–400)
RBC: 2.46 10*6/uL — ABNORMAL LOW (ref 3.85–5.22)
RDW-CV: 15.6 % — ABNORMAL HIGH (ref 11.5–15.5)
WBC: 11.3 10*3/uL — ABNORMAL HIGH (ref 3.7–11.0)

## 2020-05-10 LAB — H & H
HCT: 23.8 % — ABNORMAL LOW (ref 34.8–46.0)
HCT: 23.6 % — ABNORMAL LOW (ref 34.8–46.0)
HGB: 7.6 g/dL — ABNORMAL LOW (ref 11.5–16.0)
HGB: 7.6 g/dL — ABNORMAL LOW (ref 11.5–16.0)

## 2020-05-10 LAB — IONIZED CALCIUM WITH PH: IONIZED CALCIUM: 1.13 mmol/L (ref 1.10–1.35)

## 2020-05-10 LAB — POC BLOOD GLUCOSE (RESULTS)
GLUCOSE, POC: 105 mg/dl (ref 70–105)
GLUCOSE, POC: 127 mg/dl — ABNORMAL HIGH (ref 70–105)
GLUCOSE, POC: 142 mg/dl — ABNORMAL HIGH (ref 70–105)
GLUCOSE, POC: 140 mg/dl — ABNORMAL HIGH (ref 70–105)

## 2020-05-10 LAB — BASIC METABOLIC PANEL
ANION GAP: 7 mmol/L (ref 4–13)
BUN/CREA RATIO: 27 — ABNORMAL HIGH (ref 6–22)
BUN: 35 mg/dL — ABNORMAL HIGH (ref 8–25)
CALCIUM: 7.9 mg/dL — ABNORMAL LOW (ref 8.8–10.2)
CHLORIDE: 110 mmol/L (ref 96–111)
CO2 TOTAL: 22 mmol/L — ABNORMAL LOW (ref 23–31)
CREATININE: 1.28 mg/dL — ABNORMAL HIGH (ref 0.60–1.05)
ESTIMATED GFR: 39 mL/min/BSA — ABNORMAL LOW (ref >=60)
GLUCOSE: 101 mg/dL (ref 65–125)
POTASSIUM: 3.4 mmol/L — ABNORMAL LOW (ref 3.5–5.1)
SODIUM: 139 mmol/L (ref 136–145)

## 2020-05-10 LAB — CREATINE KINASE (CK), TOTAL, SERUM OR PLASMA: CREATINE KINASE: 500 U/L — ABNORMAL HIGH (ref 25–190)

## 2020-05-10 LAB — MAGNESIUM: MAGNESIUM: 2.2 mg/dL (ref 1.8–2.6)

## 2020-05-10 LAB — ALBUMIN: ALBUMIN: 2.2 g/dL — ABNORMAL LOW (ref 3.4–4.8)

## 2020-05-10 LAB — IONIZED CALCIUM: PH (VENOUS): 7.41 (ref 7.31–7.41)

## 2020-05-10 LAB — VITAMIN D 25, TOTAL: VITAMIN D, 25OH: 22 ng/mL — ABNORMAL LOW (ref 30–100)

## 2020-05-10 LAB — THYROID STIMULATING HORMONE WITH FREE T4 REFLEX: TSH: 1.189 u[IU]/mL (ref 0.430–3.550)

## 2020-05-10 MED ORDER — DORZOLAMIDE 2 % EYE DROPS
1.0000 [drp] | Freq: Every day | OPHTHALMIC | Status: DC
Start: 2020-05-10 — End: 2020-05-12
  Administered 2020-05-10 – 2020-05-11 (×2): 1 [drp] via OPHTHALMIC
  Filled 2020-05-10: qty 10

## 2020-05-10 MED ORDER — BISACODYL 10 MG RECTAL SUPPOSITORY
10.0000 mg | Freq: Every evening | RECTAL | Status: DC | PRN
Start: 2020-05-10 — End: 2020-05-12

## 2020-05-10 MED ORDER — POLYETHYLENE GLYCOL 3350 17 GRAM ORAL POWDER PACKET
17.0000 g | Freq: Two times a day (BID) | ORAL | Status: DC
Start: 2020-05-10 — End: 2020-05-10

## 2020-05-10 MED ORDER — POLYETHYLENE GLYCOL 3350 17 GRAM ORAL POWDER PACKET
17.0000 g | Freq: Two times a day (BID) | ORAL | Status: DC
Start: 2020-05-10 — End: 2020-05-12
  Administered 2020-05-10: 17 g via ORAL
  Administered 2020-05-10 – 2020-05-11 (×3): 0 g via ORAL
  Administered 2020-05-12: 17 g via ORAL
  Filled 2020-05-10 (×3): qty 1

## 2020-05-10 MED ORDER — DORZOLAMIDE 22.3 MG-TIMOLOL 6.8 MG/ML EYE DROPS
1.0000 [drp] | Freq: Every day | OPHTHALMIC | Status: DC
Start: 2020-05-10 — End: 2020-05-10

## 2020-05-10 MED ORDER — ACETAMINOPHEN 325 MG TABLET
1000.0000 mg | ORAL_TABLET | Freq: Three times a day (TID) | ORAL | Status: DC
Start: 2020-05-10 — End: 2020-05-12
  Administered 2020-05-10 – 2020-05-12 (×7): 975 mg via ORAL
  Filled 2020-05-10 (×7): qty 3

## 2020-05-10 MED ORDER — TIMOLOL MALEATE 0.5 % EYE DROPS
1.0000 [drp] | Freq: Every day | OPHTHALMIC | Status: DC
Start: 2020-05-10 — End: 2020-05-12
  Administered 2020-05-10 – 2020-05-12 (×3): 1 [drp] via OPHTHALMIC
  Filled 2020-05-10: qty 5

## 2020-05-10 MED ORDER — POTASSIUM CHLORIDE ER 20 MEQ TABLET,EXTENDED RELEASE(PART/CRYST)
20.0000 meq | ORAL_TABLET | ORAL | Status: AC
Start: 2020-05-10 — End: 2020-05-10
  Administered 2020-05-10: 20 meq via ORAL
  Filled 2020-05-10: qty 1
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
## 2020-05-10 NOTE — Care Plan (Signed)
[facility]  Rehabilitation Services  Occupational Therapy Progress Note    Patient Name: Theresa Greene  Date of Birth: [birth year]  Height:  168.9 cm (5' 6.5")  Weight:  60.5 kg (133 lb 6.1 oz)  Room/Bed: 20/A  Payor: MEDICARE / Plan: MEDICARE PART A AND B / Product Type: Medicare /     Assessment:    Theresa Greene tolerated OT treatment session well. She was able to complete SPT from bed to chair with FWW and minA x2, but has difficulty coordinating steps & managing R LE TTWB. She participates well in EOB ADL tasks and LB dressing from seated level but would require continued transfer training to maximize ability to care for self at d/c from wheelchair level due to prolonged WB restriction and current functional deficits.    Discharge Needs:   Equipment Recommendation: none anticipated    Discharge Disposition:  skilled nursing facility     JUSTIFICATION OF DISCHARGE RECOMMENDATION   Based on current diagnosis, functional performance prior to admission, and current functional performance, this patient requires continued OT services in skilled nursing facility in order to achieve significant functional improvements.    Plan:   Continue to follow patient according to established plan of care.  The risks/benefits of therapy have been discussed with the patient/caregiver and he/she is in agreement with the established plan of care.     Subjective & Objective:      05/10/20 1505   Therapist Pager   OT Assigned/ Pager # Robynn Pane [phone]   Rehab Session   Document Type therapy progress note (daily note)   Total OT Minutes: 33   Patient Effort good   Symptoms Noted During/After Treatment fatigue   General Information   Patient Profile Reviewed yes   Onset of Illness/Injury or Date of Surgery 05/07/20   Medical Lines PIV Line;Telemetry   Respiratory Status room air   Existing Precautions/Restrictions fall precautions;full code  (TTWB R LE)   Pre Treatment Status   Pre Treatment Patient Status Patient supine in  bed;Call light within reach;Telephone within reach;Sitter select activated;Nurse approved session   Support Present Pre Treatment  Family present;Nurse present   Communication Pre Treatment  Nurse   Mutuality/Individual Preferences   Individualized Care Needs OOB with FWW x2 from bed to chair; Corene Cornea to bathroom; TDWB R LE   Plan of Care Reviewed With patient   Self-Care   Current Activity Tolerance fair   Vital Signs   Vitals Comment HR in 70s throughout; orthostatic BPs recorded and provided to RN.   Pain Assessment   Pre/Posttreatment Pain Comment Pain not formally rated but indicated discomfort in R LE with WB   Coping/Psychosocial Response Interventions   Plan Of Care Reviewed With patient   Cognitive Assessment/Interventions   Behavior/Mood Observations cooperative;anxious   Orientation Status oriented x 4   Attention WNL/WFL   Follows Commands increased processing time needed   RUE Assessment   RUE Assessment WFL for stated baseline   LUE Assessment   LUE Assessment WFL for stated baseline   Musculoskeletal   RLE Weight-Bearing Status toe touch weight-bearing   Mobility Assessment/Training   Mobility Comment Endurance limited to pivot transfer and stand tolerance at this time.   Right Lower Extremity toe touch weight-bearing (TTWB)   Bed Mobility Assessment/Treatment   Bed Mobility, Assistive Device Head of Bed Elevated;draw sheet   Supine-Sit Independence minimum assist (75% patient effort)   Impairments pain;strength decreased;flexibility decreased;endurance;postural control impaired   Comment HHA  for trunk elevation and pull to EOB. Able to maintain sitting balance with CGA for ~4 minutes.   Transfer Assessment/Treatment   Sit-Stand Independence minimum assist (75% patient effort);verbal cues required   Stand-Sit Independence minimum assist (75% patient effort);verbal cues required   Sit-Stand-Sit, Assist Device walker, front wheeled   Toilet Transfer Independence minimum assist (75% patient  effort);verbal cues required;2 person assist required   Toilet Transfer Assist Device walker, front wheeled   Transfer Impairments endurance;balance impaired;coordination impaired;pain;strength decreased   Transfer Comment Pt standing at bedside ~2 minutes to allow for standing BP & peri hygiene with CG-minA for balance and good maintaining of TTWB with static position. She displayed increased difficulty maintaining TTWB and managing R LE during pivot to L sided chair.   Lower Body Dressing Assessment/Training   DRESSING ASSESSED Don Socks   Independence Level  minimum assist (75% patient effort)   Impairments flexibility decreased;strength decreased   Comment Displayed good functional reach to adjust B socks from long-sit position in recliner chair with legs elevated.   Toileting Assessment/Training   Position standing   TOILETING ASSESSED Perineal hygiene   Independence Level  maximum assist (25% patient effort)   Impairments flexibility decreased;balance impaired   Comment Urinary incontinence requiring assist for peri hygiene at standing level in order to maintain balance.   Grooming Assessment/Training   Position sitting   Independence Level set up required   Impairments balance impaired;activity tolerance impaired   Comment Able to use R UE to comb hair, maintaining sitting balance on EOB.   Balance Skill Training   Comment with FWW in standin g   Sitting Balance: Static fair balance   Sitting, Dynamic (Balance) fair - balance   Sit-to-Stand Balance poor + balance   Standing Balance: Static poor + balance   Standing Balance: Dynamic poor balance   Post Treatment Status   Post Treatment Patient Status Patient sitting in bedside chair or w/c;Call light within reach;Telephone within reach;Sitter select activated   Support Present Post Treatment  Family present   Financial controller Treatment Comment aware of pt request for shower cap and BPs on machine in pt doorway   Clinical  Impression   Functional Level at Time of Session Theresa Greene tolerated OT treatment session well. She was able to complete SPT from bed to chair with FWW and minA x2, but has difficulty coordinating steps & managing R LE TTWB. She participates well in EOB ADL tasks and LB dressing from seated level but would require continued transfer training to maximize ability to care for self at d/c from wheelchair level due to prolonged WB restriction and current functional deficits.   Anticipated Discharge Disposition skilled nursing facility   Highest level of Mobility score   Exercise/Activity Level Performed 5- Static standing>1 minute       Therapist:   Gerald Dexter, OT  Pager #: [phone]

## 2020-05-10 NOTE — Care Plan (Signed)
[facility]  Rehabilitation Services  Speech Therapy Progress Note    Patient Name: Theresa Greene  Date of Birth: 01-05-1935  Weight:  60.5 kg (133 lb 6.1 oz)  Room/Bed: 20/A  Payor: MEDICARE / Plan: MEDICARE PART A AND B / Product Type: Medicare /      Date/Time of Admission: 05/07/2020  3:46 AM  Admitting Diagnosis:  Fall [W19.XXXA]      Assessment:  Oropharyngeal swallow judged to be [facility] for Level 0 Liquid: Thin and mechanical soft solids. No overt s/s aspiration with tested consistencies. Good mastication and timely swallow appreciated. Per pt, this is her baseline diet.       Plan:  Recommendations: Advance diet to mechanical soft solids and thin liquids. Diet can be advanced to regular solids as tolerated and per pt preference. At this time pt requests mechanical soft diet.   Results & Recommendations Discussed With:Patient, Nurse and Family   Speech and swallow services no longer warranted. Will sign off. Thank you.       Subjective:  Pt awake in bed. Daughter at bedside. RN agreeable to swallow tx.     Objective:  Positioned pt upright in bed with assist from daughter. Upper and lower dentures in place. Pt reports improved swallowing as compared to yesterday's session. Pt accepted diced fruit in juice and fed self. Good mastication with no overt s/s aspiration. Pt consumed ~2 oz before declining further trials. Pt drank ~4 oz regular consistency water via side of cup with timely swallow initiation, good laryngeal elevation, and no s/s aspiration. Discussed diet levels and pt reported the mechanical soft diet is similar to her normal diet at home. Pt declined trials of regular solids. Left pt with on apparent needs.       Therapist:  Arta Bruce, SLP   Pager #: [phone]  Phone #: [phone]  Treatment Time: 12 minutes
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
## 2020-05-10 NOTE — Care Management Notes (Addendum)
[facility] Management Note    Patient Name: Theresa Greene  Date of Birth: Apr 22, 1935  Sex: female  Date/Time of Admission: 05/07/2020  3:46 AM  Room/Bed: 20/A  Payor: MEDICARE / Plan: MEDICARE PART A AND B / Product Type: Medicare /    LOS: 3 days   Primary Care Providers:  Hill-Reinert, Holly, DO, DO (General)    Admitting Diagnosis:  Fall [W19.XXXA]    Assessment:      05/10/20 1203   Assessment Details   Assessment Type Continued Assessment   Date of Care Management Update 05/10/20   Date of Next DCP Update 05/13/20   Medicare Intent to Discharge Documentation   Discharge IMM give to: Patient   Discharge IMM Letter Given Date 05/10/20   Discharge IMM Letter Given Time 1100   IMM explained/reviewed with:  Patient   Care Management Plan   Discharge Planning Status plan in progress   Projected Discharge Date 05/12/20   Discharge plan discussed with: Patient;Other (Comment)  (Daughter)   CM will evaluate for rehabilitation potential yes   Patient choice offered to patient/family yes   Form for patient choice reviewed/signed and on chart yes   Facility or [facility], [facility], [facility]   Discharge Needs Assessment   Discharge Facility/Level of Care Needs SNF Placement (Medicare certified)(code 3)   Transportation Available ambulance       Discharge Plan:  SNF Placement (Medicare certified) (code 3)    MSW Edwina Barth and MSW Geni Bers met with pt and daughter bedside to discuss discharge choices for SNF placement. Freedom of Choice obtained for [facility], [facility], and [facility]. Pt requests [facility] as her first choice, [facility] as second, and [facility] as third. Referrals sent to requested facilities. Freedom of Choice signed and placed on chart. IMM signed and placed on chart. [facility] PAS is  signed and will be submitted.    [facility] PAS signed, submitted, and approved. Signature placed in CM office.       The patient will continue to be evaluated for developing discharge needs.     Case Manager: Manuela Neptune, [employer] [place] [place]  Phone: [phone]

## 2020-05-10 NOTE — Care Plan (Signed)
[facility]  Rehabilitation Services  Physical Therapy Progress Note      Patient Name: Theresa Greene  Date of Birth: 02/02/1935  Height:  168.9 cm (5' 6.5")  Weight:  60.5 kg (133 lb 6.1 oz)  Room/Bed: 20/A  Payor: MEDICARE / Plan: MEDICARE PART A AND B / Product Type: Medicare /     Assessment:     Theresa Greene was alert, pleasant, and eager to work with therapy. She demonstrated improved ability to move her R LE in bed and improved tol to standing. She continues to have difficulty maintaining TDWB R LE so has not yet attempted gt. Discussed with pt and daughter that she may need to focus her rehab on transfers and standing tol with w/c for mobility until she has adequate UE strength to maintain TDWB R LE. Her current function is not adequate for safe d/c to home. She will need rehab placement at d/c.    Discharge Needs:   Equipment Recommendation: TBD  Discharge Disposition: skilled nursing facility    JUSTIFICATION OF DISCHARGE RECOMMENDATION   Based on current diagnosis, functional performance prior to admission, and current functional performance, this patient requires continued PT services in skilled nursing facility in order to achieve significant functional improvements in these deficit areas: aerobic capacity/endurance, gait, locomotion, and balance, integumentary integrity, joint integrity and mobility, muscle performance, ROM (range of motion).      Plan:   Continue to follow patient according to established plan of care.  The risks/benefits of therapy have been discussed with the patient/caregiver and he/she is in agreement with the established plan of care.     Subjective & Objective:        05/10/20 1506   Therapist Pager   PT Assigned/ Pager # Toniann Fail [phone]   Rehab Session   Document Type therapy progress note (daily note)   Total PT Minutes: 33   Patient Effort good   Symptoms Noted During/After Treatment fatigue   General Information   Patient Profile Reviewed yes    Patient/Family/Caregiver Comments/Observations Pt was alert and eager to work with PT.  Daughter was at b/s and supportive.   Medical Lines PIV Line;Telemetry   Respiratory Status room air   Existing Precautions/Restrictions fall precautions;full code;weight bearing restriction   General Observations: Pt was seen at b/s on 7ne for co-tx with OT with permission of b/s nurse.   Weight-bearing Status   Right Lower Extremity touch down weight-bearing (TDWB)   Mutuality/Individual Preferences   Individualized Care Needs OOB with FWW x2 from bed to chair; Corene Cornea to bathroom; TDWB R LE   Pre Treatment Status   Pre Treatment Patient Status Patient supine in bed;Call light within reach;Telephone within reach;Sitter select activated   Support Present Pre Treatment  Family present;Nurse present   Communication Pre Treatment  Nurse   Communication Pre Treatment Comment orthostatic BPs needed per nurse   Cognitive Assessment/Interventions   Behavior/Mood Observations alert;cooperative   Attention WNL/WFL   Follows Commands increased processing time needed   Vital Signs   Vitals Comment BP supine: 99/47 (59) with HR 70; seated 104/49 (63) with HR 70; standing 104/39 (55) with HR 73   Pain Assessment   Pre/Posttreatment Pain Comment pain with movement of R LE. Pt did not rate it; tolerated pain well.   Mobility Assessment/Training   Comment ROM exer completed B LE supine. Pt was assisted to sitting EOB. No dizziness. BP taken. She then stood to [facility]. No dizziness; stood for >  1 minute to allow BP to be taken and hygiene care provided for some urinary incontinence. She then pivoted to recliner chair at b/s. She was reclined in chair at end of tx.   Bed Mobility Assessment/Treatment   Bed Mobility, Assistive Device Head of Bed Elevated;draw sheet   Supine-Sit Independence minimum assist (75% patient effort);verbal cues required   Sit to Supine, Independence not tested   Safety Issues decreased use of legs for  bridging/pushing;decreased use of arms for pushing/pulling;impaired trunk control for bed mobility   Impairments balance impaired;coordination impaired;pain;postural control impaired;strength decreased   Comment Pt was able to move her legs over EOB with increased time and effort but needed min HHA to pull her trunk to upright.   Transfer Assessment/Treatment   Sit-Stand Independence minimum assist (75% patient effort);2 person assist required;nonverbal cues required (demo/gesture);verbal cues required   Stand-Sit Independence moderate assist (50% patient effort);2 person assist required;verbal cues required;nonverbal cues required (demo/gesture)   Sit-Stand-Sit, Assist Device walker, front wheeled   Bed-Chair Independence moderate assist (50% patient effort);2 person assist required;verbal cues required;nonverbal cues required (demo/gesture)   Bed-Chair-Bed Assist Device walker, front wheeled   Transfer Safety Issues step length decreased;weight-shifting ability decreased;sequencing ability decreased;balance decreased during turns   Transfer Impairments endurance;balance impaired;coordination impaired;pain;strength decreased;other (see comments)  (TDWB R LE)   Transfer Comment Pt needed verbal and tactile cues for hand placement and compliance with TDWB R LE. She was able to stand statically with CGA x 1 and good compliance with TDWB. She did not maintain upright well during transfer to chair and started sitting before getting fully turned to chair. Mod A x 2 needed for pivot and stand to sit. She was not able to take a step with L LE due to TDWB R LE.   Gait Assessment/Treatment   Independence  unable to perform;not appropriate to assess   Comment Pt is not yet able to maintain TDWB adequately to work on gt training.   Balance Skill Training   Comment with FWW   Sitting Balance: Static fair balance   Sitting, Dynamic (Balance) fair - balance   Sit-to-Stand Balance poor + balance   Standing Balance: Static fair -  balance   Standing Balance: Dynamic poor balance   Systems Impairment Contributing to Balance Disturbance musculoskeletal   Identified Impairments Contributing to Balance Disturbance decreased strength;decreased ROM;impaired postural control;pain;impaired coordination;other (see comments)  (TDWB R LE)   Therapeutic Exercise/Activity   Comment B AP; AROM L LE; AAROM R LE for HS and hip abd/add x ~ 5 reps each.   Post Treatment Status   Post Treatment Patient Status Patient sitting in bedside chair or w/c;Call light within reach;Telephone within reach;Sitter select activated   Support Present Post Treatment  Family present   Furniture conservator/restorer;Other  (ortho PA)   Communication Post Treatment Comment Spoke with ortho PA about WB compliance. Recommendation is for pt to limit gt and focus on transfers if she is unable to maintain TDWB at this time.   Plan of Care Review   Plan Of Care Reviewed With patient;daughter   Basic Mobility Am-PAC/6Clicks Score (APPROVED PT Staff and [facility] ONLY   Turning in bed without bedrails 3   Lying on back to sitting on edge of flat bed 3   Moving to and from a bed to a chair 3   Standing up from chair 3   Walk in room 1   Climbing 3-5 steps with railing 1   6 Clicks  Raw Score total 14   Standardized (t-scale) score 35.55   CMS 0-100% Score 53.86   CMS Modifier CK   Patient Mobility Goal (JHHLM) 4- Move to chair 3X/day   Exercise/Activity Level Performed 5- Static standing>1 minute   Physical Therapy Clinical Impression   Assessment Mrs. Siple was alert, pleasant, and eager to work with therapy. She demonstrated improved ability to move her R LE in bed and improved tol to standing. She continues to have difficulty maintaining TDWB R LE so has not yet attempted gt. Discussed with pt and daughter that she may need to focus her rehab on transfers and standing tol with w/c for mobility until she has adequate UE strength to maintain TDWB R LE. Her current function is  not adequate for safe d/c to home. She will need rehab placement at d/c.   Anticipated Equipment Needs at Discharge (PT) TBD   Anticipated Discharge Disposition skilled nursing facility       Therapist:   Jerelyn Scott, PT   Pager #: [phone]

## 2020-05-10 NOTE — Care Plan (Signed)
Patient pain controled with prn medcations. Pt SCD's maintained to RLE. Pt non-WB to RLE. Pt uses sara steady x2 assist to ambulate. Pt on fall and aspiration precautions. Pt on sitter select. Pt is A&Ox4 with occasional confusion at night. Is able to reorient. Pt on telemetry rate maintaining in 70's with baseline A-fib noted. Pt tolerting regular/soft diet. Pt recommendations to snf for D/C planning, referrals sent on 9/28.   Problem: Adult Inpatient Plan of Care  Goal: Plan of Care Review  Outcome: Ongoing (see interventions/notes)  Goal: Patient-Specific Goal (Individualized)  Outcome: Ongoing (see interventions/notes)  Goal: Absence of Hospital-Acquired Illness or Injury  Outcome: Ongoing (see interventions/notes)  Goal: Optimal Comfort and Wellbeing  Outcome: Ongoing (see interventions/notes)  Goal: Rounds/Family Conference  Outcome: Ongoing (see interventions/notes)     Problem: Fall Injury Risk  Goal: Absence of Fall and Fall-Related Injury  Outcome: Ongoing (see interventions/notes)     Problem: Skin Injury Risk Increased  Goal: Skin Health and Integrity  Outcome: Ongoing (see interventions/notes)     Problem: Acute Rehab Services Goal & Intervention Plan  Goal: Bathing Goal  Description: Stand Alone Therapy Goal  Outcome: Ongoing (see interventions/notes)  Goal: Bed Mobility Goal  Description: Stand Alone Therapy Goal  Outcome: Ongoing (see interventions/notes)  Goal: Caregiver Training Goal  Description: Stand Alone Therapy Goal  Outcome: Ongoing (see interventions/notes)  Goal: Cognition Goal  Description: Stand Alone Therapy Goal  Outcome: Ongoing (see interventions/notes)  Goal: Cognition Goals, SLP  Description: Stand Alone Therapy Goal  Outcome: Ongoing (see interventions/notes)  Goal: Communication Goals, SLP  Description: Stand Alone Therapy Goal  Outcome: Ongoing (see interventions/notes)  Goal: Dysphagia Goals, SLP  Description: Stand Alone Therapy Goal  Outcome: Ongoing (see  interventions/notes)  Goal: Eating Self-Feeding Goal  Description: Stand Alone Therapy Goal  Outcome: Ongoing (see interventions/notes)  Goal: Gait Training Goal  Description: Stand Alone Therapy Goal  Outcome: Ongoing (see interventions/notes)  Goal: Grooming Goal  Description: Stand Alone Therapy Goal  Outcome: Ongoing (see interventions/notes)  Goal: Home Management Goal  Description: Stand Alone Therapy Goal  Outcome: Ongoing (see interventions/notes)  Goal: Interprofessional Goal  Description: Stand Alone Therapy Goal  Outcome: Ongoing (see interventions/notes)  Goal: LB Dressing Goal  Description: Stand Alone Therapy Goal  Outcome: Ongoing (see interventions/notes)  Goal: Occupational Therapy Goals  Description: Stand Alone Therapy Goal  Outcome: Ongoing (see interventions/notes)  Goal: Physical Therapy Goal  Description: Stand Alone Therapy Goal  Outcome: Ongoing (see interventions/notes)  Goal: Range of Motion Goal  Description: Stand Alone Therapy Goal  Outcome: Ongoing (see interventions/notes)  Goal: Strength Goal  Description: Stand Alone Therapy Goal  Outcome: Ongoing (see interventions/notes)  Goal: Toileting Goal  Description: Stand Alone Therapy Goal  Outcome: Ongoing (see interventions/notes)  Goal: Goal Transfer Training  Description: Stand Alone Therapy Goal  Outcome: Ongoing (see interventions/notes)  Goal: UB Dressing Goal  Description: Stand Alone Therapy Goal  Outcome: Ongoing (see interventions/notes)
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
## 2020-05-10 NOTE — Nurses Notes (Signed)
Paged Toma Copier SLP about reevaluating pt to advance diet. Pt has showed improvement from yesterday, taking 4 pills at a time as opposed to 1 and splitting them. Toma Copier called back stating she would come reevaluate pt later today. Will continue to monitor pt.
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
## 2020-05-11 DIAGNOSIS — I10 Essential (primary) hypertension: Secondary | ICD-10-CM

## 2020-05-11 DIAGNOSIS — E119 Type 2 diabetes mellitus without complications: Secondary | ICD-10-CM

## 2020-05-11 LAB — BASIC METABOLIC PANEL
ANION GAP: 9 mmol/L (ref 4–13)
BUN/CREA RATIO: 36 — ABNORMAL HIGH (ref 6–22)
BUN: 34 mg/dL — ABNORMAL HIGH (ref 8–25)
CALCIUM: 8.3 mg/dL — ABNORMAL LOW (ref 8.8–10.2)
CHLORIDE: 109 mmol/L (ref 96–111)
CO2 TOTAL: 21 mmol/L — ABNORMAL LOW (ref 23–31)
CREATININE: 0.94 mg/dL (ref 0.60–1.05)
ESTIMATED GFR: 57 mL/min/BSA — ABNORMAL LOW (ref >=60)
GLUCOSE: 110 mg/dL (ref 65–125)
POTASSIUM: 3.9 mmol/L (ref 3.5–5.1)
SODIUM: 139 mmol/L (ref 136–145)

## 2020-05-11 LAB — MANUAL DIFF AND MORPHOLOGY-SYSMEX
BASOPHIL #: <0.1 10*3/uL (ref <=0.20)
BASOPHIL %: 0 %
EOSINOPHIL #: <0.1 10*3/uL (ref <=0.50)
EOSINOPHIL %: 0 %
LYMPHOCYTE #: 0.41 10*3/uL — ABNORMAL LOW (ref 1.00–4.80)
LYMPHOCYTE %: 4 %
MONOCYTE #: 1.24 10*3/uL — ABNORMAL HIGH (ref 0.20–1.10)
MONOCYTE %: 12 %
NEUTROPHIL #: 8.65 10*3/uL — ABNORMAL HIGH (ref 1.50–7.70)
NEUTROPHIL %: 84 %
NRBC FROM MANUAL DIFF: 7 per 100 WBC

## 2020-05-11 LAB — CBC WITH DIFF
HCT: 23.8 % — ABNORMAL LOW (ref 34.8–46.0)
HGB: 7.6 g/dL — ABNORMAL LOW (ref 11.5–16.0)
MCH: 29.7 pg (ref 26.0–32.0)
MCHC: 31.9 g/dL (ref 31.0–35.5)
MCV: 93 fL (ref 78.0–100.0)
MPV: 10.7 fL (ref 8.7–12.5)
PLATELETS: 250 10*3/uL (ref 150–400)
RBC: 2.56 10*6/uL — ABNORMAL LOW (ref 3.85–5.22)
RDW-CV: 16 % — ABNORMAL HIGH (ref 11.5–15.5)
WBC: 10.3 10*3/uL (ref 3.7–11.0)

## 2020-05-11 LAB — POC BLOOD GLUCOSE (RESULTS)
GLUCOSE, POC: 120 mg/dl — ABNORMAL HIGH (ref 70–105)
GLUCOSE, POC: 135 mg/dl — ABNORMAL HIGH (ref 70–105)
GLUCOSE, POC: 121 mg/dl — ABNORMAL HIGH (ref 70–105)
GLUCOSE, POC: 132 mg/dl — ABNORMAL HIGH (ref 70–105)

## 2020-05-11 LAB — CREATINE KINASE (CK), TOTAL, SERUM OR PLASMA: CREATINE KINASE: 437 U/L — ABNORMAL HIGH (ref 25–190)

## 2020-05-11 MED ORDER — CHOLECALCIFEROL (VITAMIN D3) 1,250 MCG (50,000 UNIT) CAPSULE
50000.0000 [IU] | ORAL_CAPSULE | ORAL | Status: DC
Start: 2020-05-11 — End: 2020-05-12
  Administered 2020-05-11: 50000 [IU] via ORAL
  Filled 2020-05-11: qty 1

## 2020-05-11 MED ORDER — FLU VACC 2021-22(65YR UP)-MF59C(PF) 60 MCG(15 MCGX4)/0.5 ML IM SYRINGE
0.5000 mL | INJECTION | Freq: Once | INTRAMUSCULAR | Status: AC
Start: 2020-05-11 — End: 2020-05-11
  Administered 2020-05-11: 0.5 mL via INTRAMUSCULAR
  Filled 2020-05-11: qty 0.5

## 2020-05-11 MED ORDER — OXYCODONE 5 MG TABLET
5.0000 mg | ORAL_TABLET | ORAL | 0 refills | Status: DC | PRN
Start: 2020-05-11 — End: 2020-07-19

## 2020-05-11 NOTE — Care Plan (Signed)
[facility]  Rehabilitation Services  Physical Therapy Progress Note      Patient Name: Theresa Greene  Date of Birth: [birth year]  Height:  168.9 cm (5' 6.5")  Weight:  60.5 kg (133 lb 6.1 oz)  Room/Bed: 20/A  Payor: MEDICARE / Plan: MEDICARE PART A AND B / Product Type: Medicare /     Assessment:     Mrs. Bloomfield was alert, pleasant, and cooperative with PT tx. She demonstrated improved tol to exer R LE, improvement with bed mobility, and improvement with transfers. She was able to maintain TDWB R LE for transfer but only by pivoting on L LE rather than taking any steps.  Rec SNF for rehab at d/c to address deficits in strength, ROM, balance, and mobility due to fx and TDWB R LE.    Discharge Needs:   Equipment Recommendation: TBD  Discharge Disposition: skilled nursing facility    JUSTIFICATION OF DISCHARGE RECOMMENDATION   Based on current diagnosis, functional performance prior to admission, and current functional performance, this patient requires continued PT services in skilled nursing facility in order to achieve significant functional improvements in these deficit areas: aerobic capacity/endurance, gait, locomotion, and balance, integumentary integrity, joint integrity and mobility, muscle performance, ROM (range of motion).      Plan:   Continue to follow patient according to established plan of care.  The risks/benefits of therapy have been discussed with the patient/caregiver and he/she is in agreement with the established plan of care.     Subjective & Objective:        05/11/20 1519   Therapist Pager   PT Assigned/ Pager # Toniann Fail [phone]   Rehab Session   Document Type therapy progress note (daily note)   Total PT Minutes: 17   Patient Effort good   Symptoms Noted During/After Treatment fatigue   General Information   Patient Profile Reviewed yes   Patient/Family/Caregiver Comments/Observations Pt was alert and agreeable to PT tx. Husband was at b/s and supportive.   Medical Lines PIV  Line;Telemetry   Respiratory Status room air   Existing Precautions/Restrictions fall precautions;full code;weight bearing restriction   General Observations: Pt was seen at b/s on 7ne for PT tx with permission of b/s nurse.   Weight-bearing Status   Right Lower Extremity touch down weight-bearing (TDWB)   Pre Treatment Status   Pre Treatment Patient Status Patient supine in bed;Call light within reach;Telephone within reach;Venodynes in place and activated  (SCD R LE only)   Support Present Pre Treatment  Family present   Communication Pre Treatment  Nurse   Cognitive Assessment/Interventions   Behavior/Mood Observations alert;cooperative   Attention WNL/WFL   Follows Commands WFL   Pain Assessment   Pre/Posttreatment Pain Comment mild pain with movement of R LE. Pt denied pain at rest.   Mobility Assessment/Training   Comment Pt completed exer R LE, transferred to sitting EOB, then to recliner chair via stand pivot with FWW. Completed seated exer then was reclined in chair at b/s at end of tx.   Bed Mobility Assessment/Treatment   Bed Mobility, Assistive Device bed rails;Head of Bed Elevated   Supine-Sit Independence contact guard assist   Sit to Supine, Independence not tested   Safety Issues decreased use of legs for bridging/pushing   Impairments pain;strength decreased   Comment Pt needed increased time and effort but was able to transition to EOB with only CGA today.   Transfer Assessment/Treatment   Sit-Stand Independence minimum assist (75% patient effort);verbal  cues required   Stand-Sit Independence minimum assist (75% patient effort);verbal cues required   Sit-Stand-Sit, Assist Device walker, front wheeled   Bed-Chair Independence minimum assist (75% patient effort);verbal cues required   Bed-Chair-Bed Assist Device walker, front wheeled   Transfer Safety Issues weight-shifting ability decreased;sequencing ability decreased;balance decreased during turns   Transfer Impairments endurance;balance  impaired;coordination impaired;pain;strength decreased;other (see comments)  (TDWB R LE)   Transfer Comment Pt needed verbal cues for hand placement, sequencing, and attention to TDWB. She was able to maintain TDWB R LE but pivoting on L LE without taking an actual step during the transfer. She was able to maintain upright posture until she was properly positioned in front of chair to sit down.   Gait Assessment/Treatment   Comment Pt was unable to achieve adequate UE support to take a step with L foot during transfer to chair.   Balance Skill Training   Comment with FWW in standing   Sitting Balance: Static fair balance   Sitting, Dynamic (Balance) fair - balance   Sit-to-Stand Balance poor + balance   Standing Balance: Static fair - balance   Standing Balance: Dynamic poor + balance   Systems Impairment Contributing to Balance Disturbance musculoskeletal   Identified Impairments Contributing to Balance Disturbance decreased strength;decreased ROM;impaired postural control;pain;impaired coordination;other (see comments)  (TDWB R LE)   Therapeutic Exercise/Activity   Comment Pt completed B LE exer supine for AP, HS with A into flex on R and resistance into ext on L; hip abd/add with support under heel on R; seated B LAQ with emphasis on knee flex on R as well, marching for 10 reps of each.   Post Treatment Status   Post Treatment Patient Status Patient sitting in bedside chair or w/c;Call light within reach;Telephone within reach;Sitter select activated;Venodynes in place and activated  (SCD R LE only)   Support Present Post Treatment  Family present   Communication Post Treatement Nurse   Plan of Care Review   Plan Of Care Reviewed With patient;spouse   Basic Mobility Am-PAC/6Clicks Score (APPROVED PT Staff and [facility] Nursing ONLY   Turning in bed without bedrails 3   Lying on back to sitting on edge of flat bed 3   Moving to and from a bed to a chair 3   Standing up from chair 3   Walk in room 1   Climbing 3-5 steps  with railing 1   6 Clicks Raw Score total 14   Standardized (t-scale) score 35.55   CMS 0-100% Score 53.86   CMS Modifier CK   Patient Mobility Goal (JHHLM) 4- Move to chair 3X/day   Exercise/Activity Level Performed 4- Transferred to chair/commode   Physical Therapy Clinical Impression   Assessment Mrs. Holtrop was alert, pleasant, and cooperative with PT tx. She demonstrated improved tol to exer R LE, improvement with bed mobility, and improvement with transfers. She was able to maintain TDWB R LE for transfer but only by pivoting on L LE rather than taking any steps.  Rec SNF for rehab at d/c to address deficits in strength, ROM, balance, and mobility due to fx and TDWB R LE.   Anticipated Equipment Needs at Discharge (PT) TBD   Anticipated Discharge Disposition skilled nursing facility       Therapist:   Jerelyn Scott, PT   Pager #: [phone]

## 2020-05-11 NOTE — Respiratory Therapy (Signed)
Pulmonary Eval.   Pt resting on RA, no apparent respiratory distress noted. BBS are clear/diminished. Will continue to monitor.

## 2020-05-11 NOTE — Nurses Notes (Signed)
BP 168/74 manual MD notified, will continue to monitor.

## 2020-05-11 NOTE — Consults (Signed)
[facility] Department of Orthopaedics  Service: Orthopaedic Trauma/Call  Attending: Graciela Husbands  Progress Note  05/11/2020    Name: Theresa Greene  DOB: 1935-06-29  MRN: [id number]    RECENT ORTHO SURGERY:  - ORIF R PPx distal femur fx = 9/25 (Attending Surgeon - Graciela Husbands)    SUBJECTIVE:  85 y.o. female resting in bed. Patient was able to tolerate sit to stand with PT yesterday. She is recommended to go to SNF.     OBJECTIVE:  AF, VSS   BP (!) 150/65 Comment: rn notified   Pulse 72    Temp 36.7 C (98.1 F)    Resp 16    Ht 1.689 m (5' 6.5")    Wt 60.5 kg (133 lb 6.1 oz)    SpO2 96%    BMI 21.21 kg/m     GEN - NAD,  resting in bed    RLE:  -- Inspection: Incision c/d/i   -- Sensation: SILT dorsal and plantar foot  --Motor:  + ankle dorsiflexion and plantarflexion, + EHL/FHL.  --CV: 2+ DP/PT pulse.  BCR < 2 sec in all toes, WWP    Hemogram   Lab Results   Component Value Date/Time    WBC 10.3 05/11/2020 03:56 AM    HGB 7.6 (L) 05/11/2020 03:56 AM    HCT 23.8 (L) 05/11/2020 03:56 AM    PLTCNT 250 05/11/2020 03:56 AM    RBC 2.56 (L) 05/11/2020 03:56 AM    MCV 93.0 05/11/2020 03:56 AM    MCHC 31.9 05/11/2020 03:56 AM    MCH 29.7 05/11/2020 03:56 AM    MPV 10.7 05/11/2020 03:56 AM        Basic Metabolic Profile    Lab Results   Component Value Date/Time    SODIUM 139 05/11/2020 03:56 AM    POTASSIUM 3.9 05/11/2020 03:56 AM    CHLORIDE 109 05/11/2020 03:56 AM    CO2 21 (L) 05/11/2020 03:56 AM    ANIONGAP 9 05/11/2020 03:56 AM    Lab Results   Component Value Date/Time    BUN 34 (H) 05/11/2020 03:56 AM    CREATININE 0.94 05/11/2020 03:56 AM    GLUCOSENF 110 05/11/2020 03:56 AM            ASSESSMENT:  85 y.o. female 4 Days Post-Op s/p ORIF R PPx distal femur fx    PLAN:  - Weightbearing: TDWB RLE  - PT/OT: SNF  - DVT prophylaxis: heparin  - Dressing: located RLE, changed today  - Follow-up: will see back in Dr. Odessa Fleming clinic in 2 wks.    Please notify us of impending discharge so that we can review discharge instructions and arrange  follow up with the call center.    --  Carlynn Herald, MD  Resident, PGY-3  Department of Orthopaedics  Pager [phone]  05/11/2020 07:45      The patient's condition and plan of care was discussed with me.  The note above was personally reviewed and appended before signature.    Kathee Delton MD  Assistant Professor Orthopaedic Surgery  Pager [phone]

## 2020-05-11 NOTE — Care Plan (Signed)
Patient pain controled with prn and scheduled medications. Pt tolerating TDWB status with PT/OT evaluations and recommendations for snf upon discharge. Pt tolerating regular/soft diet. Pt takes medications well with water. Pt A&Ox3 with baseline confusion at night, able to reorient easily. Pt family involved in care and discharge planning. Pt D/C TBD with snf placement referrals.   Problem: Adult Inpatient Plan of Care  Goal: Plan of Care Review  Outcome: Ongoing (see interventions/notes)  Goal: Patient-Specific Goal (Individualized)  Outcome: Ongoing (see interventions/notes)  Goal: Absence of Hospital-Acquired Illness or Injury  Outcome: Ongoing (see interventions/notes)  Goal: Optimal Comfort and Wellbeing  Outcome: Ongoing (see interventions/notes)  Goal: Rounds/Family Conference  Outcome: Ongoing (see interventions/notes)     Problem: Fall Injury Risk  Goal: Absence of Fall and Fall-Related Injury  Outcome: Ongoing (see interventions/notes)     Problem: Skin Injury Risk Increased  Goal: Skin Health and Integrity  Outcome: Ongoing (see interventions/notes)     Problem: Acute Rehab Services Goal & Intervention Plan  Goal: Bathing Goal  Description: Stand Alone Therapy Goal  Outcome: Ongoing (see interventions/notes)  Goal: Bed Mobility Goal  Description: Stand Alone Therapy Goal  Outcome: Ongoing (see interventions/notes)  Goal: Caregiver Training Goal  Description: Stand Alone Therapy Goal  Outcome: Ongoing (see interventions/notes)  Goal: Cognition Goal  Description: Stand Alone Therapy Goal  Outcome: Ongoing (see interventions/notes)  Goal: Cognition Goals, SLP  Description: Stand Alone Therapy Goal  Outcome: Ongoing (see interventions/notes)  Goal: Communication Goals, SLP  Description: Stand Alone Therapy Goal  Outcome: Ongoing (see interventions/notes)  Goal: Dysphagia Goals, SLP  Description: Stand Alone Therapy Goal  Outcome: Ongoing (see interventions/notes)  Goal: Eating Self-Feeding Goal  Description:  Stand Alone Therapy Goal  Outcome: Ongoing (see interventions/notes)  Goal: Gait Training Goal  Description: Stand Alone Therapy Goal  Outcome: Ongoing (see interventions/notes)  Goal: Grooming Goal  Description: Stand Alone Therapy Goal  Outcome: Ongoing (see interventions/notes)  Goal: Home Management Goal  Description: Stand Alone Therapy Goal  Outcome: Ongoing (see interventions/notes)  Goal: Interprofessional Goal  Description: Stand Alone Therapy Goal  Outcome: Ongoing (see interventions/notes)  Goal: LB Dressing Goal  Description: Stand Alone Therapy Goal  Outcome: Ongoing (see interventions/notes)  Goal: Occupational Therapy Goals  Description: Stand Alone Therapy Goal  Outcome: Ongoing (see interventions/notes)  Goal: Physical Therapy Goal  Description: Stand Alone Therapy Goal  Outcome: Ongoing (see interventions/notes)  Goal: Range of Motion Goal  Description: Stand Alone Therapy Goal  Outcome: Ongoing (see interventions/notes)  Goal: Strength Goal  Description: Stand Alone Therapy Goal  Outcome: Ongoing (see interventions/notes)  Goal: Toileting Goal  Description: Stand Alone Therapy Goal  Outcome: Ongoing (see interventions/notes)  Goal: Goal Transfer Training  Description: Stand Alone Therapy Goal  Outcome: Ongoing (see interventions/notes)  Goal: UB Dressing Goal  Description: Stand Alone Therapy Goal  Outcome: Ongoing (see interventions/notes)

## 2020-05-11 NOTE — Consults (Signed)
[facility]               Trauma Consult Progress Note    Date of Birth:  [birth year]  Date of Admission:  05/07/2020  Date of service: 05/11/2020    Theresa Greene, 85 y.o., female Post trauma day 4 status post fall.      Subjective:    Patient seen at bedside, resting comfortably, NAD. Pain controlled. Denies CP, SOB, abd pain, n/v, dizziness.     Objective   24 Hour Summary:    Filed Vitals:    05/11/20 0741 05/11/20 0834 05/11/20 1213 05/11/20 1218   BP: (!) 165/69      Pulse: 70      Resp: 16  16    Temp: 37.5 C (99.5 F)      SpO2:  94%  93%     Labs:  Recent Labs     05/09/20  0816 05/09/20  1016 05/10/20  0628 05/10/20  0628 05/10/20  0811 05/10/20  1441 05/11/20  0356   WBC 14.6*  --  11.3*  --   --   --  10.3   HGB 8.3*   < > 7.4*   < > 7.6* 7.6* 7.6*   HCT 25.2*   < > 22.9*   < > 23.8* 23.6* 23.8*   SODIUM 140  --  139  --   --   --  139   POTASSIUM 4.0  --  3.4*  --   --   --  3.9   CHLORIDE 110  --  110  --   --   --  109   BUN 44*  --  35*  --   --   --  34*   CREATININE 1.85*  --  1.28*  --   --   --  0.94   ANIONGAP 16*  --  7  --   --   --  9   CALCIUM 8.1*  --  7.9*  --   --   --  8.3*   MAGNESIUM  --   --  2.2  --   --   --   --     < > = values in this interval not displayed.     In: 1110 [P.O.:1110]  Out: 1600 [Urine:1600]  Nutrition Management: MNT PROTOCOL FOR DIETITIAN  DIET REGULAR Consistency/thickening: MECHANICAL SOFT (Ground meat/Soft foods)  DIETARY ORAL SUPPLEMENTS Oral Supplements with tray: Ensure Enlive-Strawberry; BREAKFAST/LUNCH; 1 Each  ROOM SERVICE:  NEEDS VISIT FOR MENU CHOICES Last Bowel Movement: 05/11/20  Recent Labs     05/10/20  0628   ALBUMIN 2.2*     Current Medications:  Current Outpatient Medications   Medication Sig    oxyCODONE (ROXICODONE) 5 mg Oral Tablet Take 1 Tablet (5 mg total) by mouth Every 4 hours as needed     Today's Physical Exam:    GEN:   NAD  HEENT:   Normocephalic  PULM:   Lung sounds clear to auscultation  bilaterally.  Normal respiratory effort.  CV:   RRR  ABD:   Abdomen soft, nontender, and nondistended.    MS: tenderness to right thigh, dressing c/d/i, compartments soft and compressible, ecchymosis noted to left hip without skin breakdown, some swelling noted to left thigh- soft and compressible without skin breakdown, small area of ecchymosis noted to left lateral distal calf  NEURO:   Awake, alert, answering questions appropriately  Integumentary:  Pink, warm, and  dry, healing burns to left medial thigh and left foot    Assessment/ Plan:   Active Hospital Problems   (*Primary Problem)    Diagnosis    Fall       DVT prophylaxis:  SCDs/ Venodynes/Impulse boots and Heparin  Anticoagulants (last 24 hours)     Date/Time Action Medication Dose    05/11/20 1215 Given    heparin 5,000 unit/mL injection 5,000 Units    05/11/20 0515 Given    heparin 5,000 unit/mL injection 5,000 Units    05/10/20 2031 Given    heparin 5,000 unit/mL injection 5,000 Units        Nutrition: MNT PROTOCOL FOR DIETITIAN  DIET REGULAR Consistency/thickening: MECHANICAL SOFT (Ground meat/Soft foods)  DIETARY ORAL SUPPLEMENTS Oral Supplements with tray: Ensure Enlive-Strawberry; BREAKFAST/LUNCH; 1 Each  ROOM SERVICE:  NEEDS VISIT FOR MENU CHOICES diet, Last Bowel Movement: 05/11/20  Activity: TDWB RLE  Pain:   Analgesics (last 24 hours)     Date/Time Action Medication Dose    05/09/20 1114 Given    acetaminophen (TYLENOL) tablet 650 mg    05/09/20 0426 Given    HYDROmorphone (DILAUDID) 1 mg/mL injection 0.2 mg    05/08/20 2039 Given    HYDROmorphone (DILAUDID) 1 mg/mL injection 0.2 mg        Social: Care Management has seen the patient  PT Recommendations: skilled nursing facility  Additional recommendations:     Right periprosthetic distal femoral fx   -OR 9/25 for ORIF   -TDWB RLE   -f/u x2 weeks    Left hip/thigh hematoma   -h/h 7.6 this AM, continue to monitor h/h   -consider adding prenatal     Healing burns to LLE   -agree with wound care  consult    -on heparin for DVT ppx  -ensure good bowel regimen  -PT/OT    Aislinn McIlvenny, PA-C 05/11/2020, 15:02     Trauma/Surgical Critical Care/Acute Care Surgery Staff  I saw and examined the patient.  I reviewed the Medical sales representative note.  I agree with their findings and plan of care as documented in their note.  Any exceptions/additions are edited/noted.    Electronically Signed by:    Olam Idler, MD  Assistant Professor of Surgery  Trauma, Surgical Critical Care, and Acute Care Surgery  05/11/2020 18:08

## 2020-05-11 NOTE — Care Management Notes (Signed)
[facility] Management Note    Patient Name: Theresa Greene  Date of Birth: August 20, 1934  Sex: female  Date/Time of Admission: 05/07/2020  3:46 AM  Room/Bed: 20/A  Payor: MEDICARE / Plan: MEDICARE PART A AND B / Product Type: Medicare /    LOS: 4 days   Primary Care Providers:  Hill-Reinert, Holly, DO, DO (General)    Admitting Diagnosis:  Fall [W19.XXXA]    Assessment:      05/11/20 1106   Assessment Details   Assessment Type Continued Assessment   Date of Care Management Update 05/11/20   Date of Next DCP Update 05/13/20   Readmission   Is this a readmission? No   Care Management Plan   Discharge Planning Status plan in progress   Projected Discharge Date 05/12/20   Discharge plan discussed with: Patient   CM will evaluate for rehabilitation potential yes   Patient choice offered to patient/family yes   Form for patient choice reviewed/signed and on chart yes   Facility or [facility]   Discharge Needs Assessment   Discharge Facility/Level of Care Needs SNF vs Rehab   Transportation Available ambulance       Discharge Plan:  SNF vs Rehab     MSW Geni Bers and MSW [place] met with pt bedside to expand placement referrals for discharge. Pt is tentative discharge today. Per Zettie Pho has accepted the pt pending bed availability. [facility] has no beds available. MSW Edwina Barth obtained Freedom of Choice for requested [facility]. Freedom of Choice signed and placed on pt chart. Referral made.     The patient will continue to be evaluated for developing discharge needs.     Case Manager: Manuela Neptune, [place]  Phone: [phone]

## 2020-05-11 NOTE — Progress Notes (Signed)
INTERNAL MEDICINE  Progress Note     Name: Theresa Greene, Theresa Greene, 85 y.o. female Date of Admission: 05/07/2020   Date of Birth:  01-09-1935 Date of Service:  05/11/2020   PCP: Theresa Lim Hill-Reinert, DO  LOS:  LOS: 4 days    Room: 20/A Attending: Diamantina Providence, MD     Information Obtained from: Patient   Chief Complaint: r femur fracture     Code Status: Full Code    SUBJECTIVE   INTERVAL EVENTS:      No acute events overnight.  She has no complaints and says that her pain is controlled.  She is waiting for rehab authorization.  Labs are grossly stable.  Discussed with family at bedside.     I/O:       Intake/Output Summary (Last 24 hours) at 05/11/2020 1115  Last data filed at 05/11/2020 1030  Gross per 24 hour   Intake 1150 ml   Output 1600 ml   Net -450 ml     Last Bowel Movement: 05/11/20     EXAMINATION:     Temp  Avg: 37 C (98.6 F)  Min: 36.7 C (98.1 F)  Max: 37.5 C (99.5 F)   Pulse  Avg: 70.4  Min: 69  Max: 72 BP  Min: 99/47  Max: 165/69   Resp  Avg: 16.3  Min: 16  Max: 18 SpO2  Avg: 94 %  Min: 92 %  Max: 96 %   Constitutional: no acute distress, appears well  Eyes: Conjunctiva clear., Pupils equal and round.   ENT: ENMT without erythema or injection, mucous membranes moist.  Neck: no thyromegaly or lymphadenopathy and supple, symmetrical, trachea midline  Respiratory: Clear to auscultation bilaterally. Normal effort  Cardiovascular: regular rate and rhythm. No murmurs.  Gastrointestinal: Soft, non-tender, Bowel sounds normal  Musculoskeletal:  right leg postop site clean  Integumentary:  Skin warm and dry, No rashes and No lesions  Neurologic: Alert and oriented x3. Grossly intact strength and sensation  Lymphatic/Immunologic/Hematologic: No lymphadenopathy  Psychiatric: Affect Normal      LABS:       CBC BMP   Recent Labs     05/09/20  0816 05/09/20  1016 05/10/20  0628 05/10/20  0628 05/10/20  0811 05/10/20  1441 05/11/20  0356   WBC 14.6*  --  11.3*  --   --   --  10.3   HGB 8.3*   < > 7.4*   < >  7.6* 7.6* 7.6*   HCT 25.2*   < > 22.9*   < > 23.8* 23.6* 23.8*   PLTCNT 234  --  239  --   --   --  250    < > = values in this interval not displayed.     Recent Labs     05/09/20  0816 05/10/20  0628 05/11/20  0356   PMNS 83 68 84   LYMPHOCYTES 8  --  4   MONOCYTES 9 15 12    EOSINOPHIL 0  --  0   BASOPHILS 0   <0.10 0   <0.10 0   <0.10   PMNABS 12.12* 7.70 8.65*   LYMPHSABS  --  1.72  --    MONOSABS 1.31* 1.69* 1.24*   EOSABS <0.10 <0.10 <0.10    Recent Labs     05/08/20  1241 05/09/20  0816 05/10/20  0628 05/11/20  0356   SODIUM 140 140 139 139   POTASSIUM 4.0 4.0 3.4* 3.9  CHLORIDE 114* 110 110 109   CO2 13* 14* 22* 21*   BUN 43* 44* 35* 34*   CREATININE 1.94* 1.85* 1.28* 0.94   GFR 24* 25* 39* 57*   ANIONGAP 13 16* 7 9     Recent Labs     05/08/20  1241 05/09/20  0816 05/10/20  0628 05/11/20  0356   CALCIUM 7.5* 8.1* 7.9* 8.3*   ALBUMIN  --   --  2.2*  --    MAGNESIUM 2.1  --  2.2  --    PHOSPHORUS 4.7*  --   --   --         IMAGING / STUDIES:           MICROBIOLOGY / PATHOLOGY:      No results found for any visits on 05/07/20 (from the past 96 hour(s)).     CURRENT INPATIENT MEDICATION LIST     acetaminophen (TYLENOL) tablet, 975 mg, Oral, Q8H  amiodarone (CORDARONE) tablet, 200 mg, Oral, Daily  atorvastatin (LIPITOR) tablet, 20 mg, Oral, Daily  bacitracin zinc 500 units/gram topical ointment packet, 1 Packet, Topical, 2x/day PRN  bisacodyl (DULCOLAX) rectal suppository, 10 mg, Rectal, HS PRN  calcium carbonate (TUMS) chewable tablet, 500 mg, Oral, 3x/day  docusate sodium (COLACE) capsule, 100 mg, Oral, 2x/day  dorzolamide (TRUSOPT) 2 % ophthalmic solution, 1 Drop, Right Eye, Daily  gabapentin (NEURONTIN) capsule, 100 mg, Oral, 3x/day PRN  heparin 5,000 unit/mL injection, 5,000 Units, Subcutaneous, Q8HRS  [Held by provider] hydrALAZINE (APRESOLINE) tablet, 25 mg, Oral, 2x/day  [Held by provider] losartan (COZAAR) tablet, 50 mg, Oral, Daily  metoprolol tartrate (LOPRESSOR) tablet, 25 mg, Oral, Daily  [Held by  provider] NIFEdipine (PROCARDIA XL) 24 hr extended release tablet, 30 mg, Oral, 2X/day  nitroGLYCERIN (NITROSTAT) sublingual tablet, 0.4 mg, Sublingual, Q5 Min PRN  ondansetron (ZOFRAN) 2 mg/mL injection, 4 mg, Intravenous, Q8H PRN  oxyCODONE (ROXICODONE) immediate release tablet, 5 mg, Oral, Q4H PRN  pancreatic enzyme replacement (CREON) 12,000 units lipase per cap, 3 Capsule, Oral, 3x/day-Meals  pantoprazole (PROTONIX) delayed release tablet, 40 mg, Oral, Daily  perflutren lipid microspheres (DEFINITY) 1.3 mL in NS 10 mL (tot vol) injection, 2 mL, Intravenous, Give in Cardiology  polyethylene glycol (MIRALAX) oral packet, 17 g, Oral, 2x/day  prochlorperazine (COMPAZINE) 5 mg/mL injection, 10 mg, Intravenous, Q6H PRN  sennosides-docusate sodium (SENOKOT-S) 8.6-50mg  per tablet, 1 Tablet, Oral, 2x/day  sodium bicarbonate tablet, 650 mg, Oral, 2x/day  sore throat (CEPACOL) oral lozenge, 1 Lozenge, Oral, Q2H PRN  SSIP insulin lispro (HUMALOG) 100 units/mL injection, 0-12 Units, Subcutaneous, Q4H PRN  timolol (TIMOPTIC) 0.5% ophthalmic solution, 1 Drop, Right Eye, Daily         ASSESSMENT:     Theresa Greene is a 85 y.o. female with a PMHx pertinent for CAD s/p PCI, Afib s/p pacemaker (st jude), HTN and HLD who initially presented on 05/07/2020 with fall and found to have a right hipf fracture s/p ORIF. Post operatively was monitored in the SICU. Transferred to Medicine Step down on 9/26.        PLAN:         R Hip fracture  -s/p ORIF on 9/26 with orthopedics  -will need DVT ppx for 3-4 weeks,on heparin  -metabolic bone and geriatrics services evaluated  -vitamin D level pending. Continue 50k units weekly x8 weeks  -PT/OT recommend SNF    Multiple Falls  -presenting history not suggestive of syncope.  She fell at 4:00 a.m. on date of  admission when attempting  -orthostatic vital negative  -ICD interrogation-negative  -TTE with no valvular disease    Hypertension-controlled  -currently holding nifedipine, cozaar and  hydralazine. May not require at discharge if remains controlled off of antihypertensive    Hyperkalemia-resolved  -will monitor    Hypocalcemia-improving  -started on calcium carbonate. Vitamin D as above    AKI on CKD3a-resolved  -likely secondary to rhabdomyolysis after fall (now resolved)  -creatinine peaked at 2.34 then resolved to baseline (Cr 1-1.2)  -FeNa 4.3%, indicative of post renal picture  -renal US with atrophic kidneys, 1 cm simple cyst noted, follow up with primary nephrologist  -continue home sodium bicarb  -trend daily BMP and CK    Skin burn  -obtain wound care consult-appreciate recs    Afib s/p pacemaker  -on amiodarone and lopressor  -not on AC at home, CHADSVasc atleast 5 however given patient is having multiple falls further [facility] would have be based on risk vs benefit discussions    DM2  -was on metformin and then taken off prior to admission  -A1c 5.3%    HLD  -continue home statin  ___    DVT/PE Prophylaxis - Heparin  Therapy - OT and PT  Disposition Planning - Skilled Nursing Unit, awaiting final disposition plans    Theresa Providence, MD

## 2020-05-12 ENCOUNTER — Telehealth (INDEPENDENT_AMBULATORY_CARE_PROVIDER_SITE_OTHER): Payer: Self-pay | Admitting: Internal Medicine

## 2020-05-12 ENCOUNTER — Encounter (INDEPENDENT_AMBULATORY_CARE_PROVIDER_SITE_OTHER): Payer: Self-pay | Admitting: Rheumatology

## 2020-05-12 DIAGNOSIS — I2510 Atherosclerotic heart disease of native coronary artery without angina pectoris: Secondary | ICD-10-CM

## 2020-05-12 LAB — CBC
HCT: 23.8 % — ABNORMAL LOW (ref 34.8–46.0)
HGB: 7.6 g/dL — ABNORMAL LOW (ref 11.5–16.0)
MCH: 29.9 pg (ref 26.0–32.0)
MCHC: 31.9 g/dL (ref 31.0–35.5)
MCV: 93.7 fL (ref 78.0–100.0)
MPV: 10.6 fL (ref 8.7–12.5)
PLATELETS: 252 10*3/uL (ref 150–400)
RBC: 2.54 10*6/uL — ABNORMAL LOW (ref 3.85–5.22)
RDW-CV: 16 % — ABNORMAL HIGH (ref 11.5–15.5)
WBC: 9.6 10*3/uL (ref 3.7–11.0)

## 2020-05-12 LAB — POC BLOOD GLUCOSE (RESULTS)
GLUCOSE, POC: 104 mg/dl (ref 70–105)
GLUCOSE, POC: 190 mg/dl — ABNORMAL HIGH (ref 70–105)

## 2020-05-12 LAB — BASIC METABOLIC PANEL
ANION GAP: 5 mmol/L (ref 4–13)
BUN/CREA RATIO: 30 — ABNORMAL HIGH (ref 6–22)
BUN: 27 mg/dL — ABNORMAL HIGH (ref 8–25)
CALCIUM: 7.6 mg/dL — ABNORMAL LOW (ref 8.8–10.2)
CHLORIDE: 110 mmol/L (ref 96–111)
CO2 TOTAL: 25 mmol/L (ref 23–31)
CREATININE: 0.89 mg/dL (ref 0.60–1.05)
ESTIMATED GFR: 61 mL/min/BSA (ref >=60)
GLUCOSE: 96 mg/dL (ref 65–125)
POTASSIUM: 4 mmol/L (ref 3.5–5.1)
SODIUM: 140 mmol/L (ref 136–145)

## 2020-05-12 LAB — COVID-19 ~~LOC~~ MOLECULAR LAB TESTING: SARS-CoV-2: NOT DETECTED

## 2020-05-12 MED ORDER — LOSARTAN 25 MG TABLET
25.0000 mg | ORAL_TABLET | Freq: Every day | ORAL | Status: DC
Start: 2020-05-12 — End: 2020-05-12
  Administered 2020-05-12: 25 mg via ORAL
  Filled 2020-05-12 (×2): qty 1

## 2020-05-12 MED ORDER — ENOXAPARIN 40 MG/0.4 ML SUBCUTANEOUS SYRINGE
40.0000 mg | INJECTION | SUBCUTANEOUS | 0 refills | Status: DC
Start: 2020-05-12 — End: 2020-07-19

## 2020-05-12 MED ORDER — POLYETHYLENE GLYCOL 3350 17 GRAM ORAL POWDER PACKET
17.0000 g | Freq: Two times a day (BID) | ORAL | Status: DC
Start: 2020-05-12 — End: 2020-07-19

## 2020-05-12 MED ORDER — BACITRACIN ZINC 500 UNIT/GRAM TOPICAL OINTMENT IN PACKET
1.0000 | TOPICAL_OINTMENT | Freq: Two times a day (BID) | CUTANEOUS | Status: DC | PRN
Start: 2020-05-12 — End: 2020-07-19

## 2020-05-12 MED ORDER — GABAPENTIN 100 MG CAPSULE
100.0000 mg | ORAL_CAPSULE | Freq: Three times a day (TID) | ORAL | 1 refills | Status: DC | PRN
Start: 2020-05-12 — End: 2020-07-19

## 2020-05-12 MED ORDER — SENNOSIDES 8.6 MG-DOCUSATE SODIUM 50 MG TABLET
1.0000 | ORAL_TABLET | Freq: Two times a day (BID) | ORAL | 0 refills | Status: DC | PRN
Start: 2020-05-12 — End: 2020-07-19

## 2020-05-12 NOTE — Progress Notes (Signed)
[facility]  Medicine Progress Note    Theresa Greene  Date of service: 05/12/2020  Date of Admission:  05/07/2020    Hospital Day:  LOS: 5 days     Assessment/ Plan:   Active Hospital Problems    Diagnosis    Fall       Theresa Greene is a 85 y.o. female with a PMHx pertinent for CAD s/p PCI, Afib s/p pacemaker (st jude), HTN and HLD who initially presented on 05/07/2020 with fall and found to have a right hipf fracture s/p ORIF.    Right hip fracture  S/p ORIF on 9/26 with orthopedics   Will need DVT ppx for 3-4 weeks, will switch to Lovenox   Metabolic bone and geriatric services evaluated  Vitamin D level 22. Continue 50K units weekly x8 weeks  PT/OT recommending SNF   Multiple falls  Presenting history not suggestive of syncope.  Orthostatic vitals negative  ICD interrogation negative  TTE without valvular disease   HTN  Restart cozaar today. Restart nifedipine and hydralazine on discharge   Hyperkalemia--resolved   Continue to monitor   Hypocalcemia  Started on calcium carbonate. Vitamin D as above  AKI on CKD3a--resolved  Likely 2/2 rhabdomyolysis after fall (now resolved)  Cr peaked at 2.34 then resolved to baseline (Cr 1-1.2)  FeNa 4.3% indicative of post renal picture   Renal US with atrophic kidneys, 1cm simple cyst noted, f/u with primary nephrologist outpatient   Continue home sodium bicarb  Skin burn  Appreciate wound care recs  Afib s/p pacemaker  On amiodarone and lopressor  Not on AC at home. CHADS2Vasc at least 5, however pt has frequent falls. Further AC would have to be based on risk vs benefit discussion  DM2--controlled   Lab Results   Component Value Date    HA1C 5.3 05/07/2020   was on metformin previously, taken off prior to admission     DVT/PE Prophylaxis: Heparin  Code Status:   Code Status Information       Code Status Advance Care Planning    Full Code Jump to the Activity          Disposition Planning: Skilled Nursing Unit     Hospital Course:  Theresa Greene is a  85 y.o. female with a PMHx pertinent for CAD s/p PCI, Afib s/p pacemaker (st jude), HTN and HLD who initially presented on 05/07/2020 with fall and found to have a right hipf fracture s/p ORIF. Post operatively was monitored in the SICU. Transferred to Medicine Step down on 9/26.    Subjective:  Resting comfortably in bed. She denies CP, SOB, abd pain, N/V, fever, chills, headache. Reports that her RLE is sore but she is tolerating the pain well. No new concerns.     Review of Systems   Constitutional: Negative for chills, fatigue and fever.   Respiratory: Negative for cough and shortness of breath.    Cardiovascular: Negative for chest pain.   Gastrointestinal: Negative for abdominal pain, constipation, diarrhea, nausea and vomiting.   Musculoskeletal:        Sore RLE   Neurological: Negative for dizziness and headaches.   All other systems reviewed and are negative.      Vital Signs:  Temp  Avg: 36.9 C (98.4 F)  Min: 36.7 C (98.1 F)  Max: 37.1 C (98.8 F)    Pulse  Avg: 70  Min: 69  Max: 72 BP  Min: 149/76  Max: 174/69  Resp  Avg: 16.8  Min: 16  Max: 18 SpO2  Avg: 93.7 %  Min: 93 %  Max: 94 %          Input/Output    Intake/Output Summary (Last 24 hours) at 05/12/2020 0809  Last data filed at 05/12/2020 0600  Gross per 24 hour   Intake 810 ml   Output 300 ml   Net 510 ml     I/O last shift:  No intake/output data recorded.   acetaminophen (TYLENOL) tablet, 975 mg, Oral, Q8H  amiodarone (CORDARONE) tablet, 200 mg, Oral, Daily  atorvastatin (LIPITOR) tablet, 20 mg, Oral, Daily  bacitracin zinc 500 units/gram topical ointment packet, 1 Packet, Topical, 2x/day PRN  bisacodyl (DULCOLAX) rectal suppository, 10 mg, Rectal, HS PRN  calcium carbonate (TUMS) chewable tablet, 500 mg, Oral, 3x/day  cholecalciferol (VITAMIN D3) 50,000 unit capsule, 50,000 Units, Oral, Q7 Days  docusate sodium (COLACE) capsule, 100 mg, Oral, 2x/day  dorzolamide (TRUSOPT) 2 % ophthalmic solution, 1 Drop, Right Eye, Daily  gabapentin  (NEURONTIN) capsule, 100 mg, Oral, 3x/day PRN  heparin 5,000 unit/mL injection, 5,000 Units, Subcutaneous, Q8HRS  [Held by provider] hydrALAZINE (APRESOLINE) tablet, 25 mg, Oral, 2x/day  losartan (COZAAR) tablet, 25 mg, Oral, Daily  metoprolol tartrate (LOPRESSOR) tablet, 25 mg, Oral, Daily  [Held by provider] NIFEdipine (PROCARDIA XL) 24 hr extended release tablet, 30 mg, Oral, 2X/day  nitroGLYCERIN (NITROSTAT) sublingual tablet, 0.4 mg, Sublingual, Q5 Min PRN  ondansetron (ZOFRAN) 2 mg/mL injection, 4 mg, Intravenous, Q8H PRN  oxyCODONE (ROXICODONE) immediate release tablet, 5 mg, Oral, Q4H PRN  pancreatic enzyme replacement (CREON) 12,000 units lipase per cap, 3 Capsule, Oral, 3x/day-Meals  pantoprazole (PROTONIX) delayed release tablet, 40 mg, Oral, Daily  perflutren lipid microspheres (DEFINITY) 1.3 mL in NS 10 mL (tot vol) injection, 2 mL, Intravenous, Give in Cardiology  polyethylene glycol (MIRALAX) oral packet, 17 g, Oral, 2x/day  prochlorperazine (COMPAZINE) 5 mg/mL injection, 10 mg, Intravenous, Q6H PRN  sennosides-docusate sodium (SENOKOT-S) 8.6-50mg  per tablet, 1 Tablet, Oral, 2x/day  sodium bicarbonate tablet, 650 mg, Oral, 2x/day  sore throat (CEPACOL) oral lozenge, 1 Lozenge, Oral, Q2H PRN  SSIP insulin lispro (HUMALOG) 100 units/mL injection, 0-12 Units, Subcutaneous, Q4H PRN  timolol (TIMOPTIC) 0.5% ophthalmic solution, 1 Drop, Right Eye, Daily        Physical Exam:   Physical Exam  Vitals and nursing note reviewed.   Constitutional:       General: She is awake. She is not in acute distress.  HENT:      Head: Normocephalic and atraumatic.      Nose: Nose normal.      Mouth/Throat:      Mouth: Mucous membranes are moist.   Eyes:      Extraocular Movements: Extraocular movements intact.      Conjunctiva/sclera: Conjunctivae normal.   Cardiovascular:      Rate and Rhythm: Normal rate and regular rhythm.   Pulmonary:      Effort: Pulmonary effort is normal.      Breath sounds: Normal breath sounds. No  wheezing.   Abdominal:      General: Bowel sounds are normal. There is no distension.      Palpations: Abdomen is soft.      Tenderness: There is no abdominal tenderness.   Musculoskeletal:      Cervical back: Normal range of motion.      Comments: Right leg post-op incision C/D/I   Skin:     General: Skin is warm and dry.  Neurological:      General: No focal deficit present.      Mental Status: She is alert and oriented to person, place, and time.   Psychiatric:         Attention and Perception: Attention normal.         Mood and Affect: Mood normal.         Behavior: Behavior normal. Behavior is cooperative.         Labs:  CBC Results Differential Results   Recent Results (from the past 30 hour(s))   CBC WITH DIFF    Collection Time: 05/11/20  3:56 AM   Result Value    WBC 10.3    HGB 7.6 (L)    HCT 23.8 (L)    PLATELETS 250    Recent Results (from the past 30 hour(s))   MANUAL DIFF AND MORPHOLOGY-SYSMEX    Collection Time: 05/11/20  3:56 AM   Result Value    NEUTROPHIL % 84    LYMPHOCYTE %  4    MONOCYTE % 12    EOSINOPHIL % 0    BASOPHIL % 0    BASOPHIL # <0.10   CBC WITH DIFF    Collection Time: 05/11/20  3:56 AM   Result Value    WBC 10.3        BMP Results Other Chemistries Results   Results for orders placed or performed during the hospital encounter of 05/07/20 (from the past 30 hour(s))   BASIC METABOLIC PANEL - AM ONCE    Collection Time: 05/12/20  7:25 AM   Result Value    SODIUM 140    POTASSIUM 4.0    CHLORIDE 110    CO2 TOTAL 25    GLUCOSE 96    BUN 27 (H)    CREATININE 0.89    No results found for this or any previous visit (from the past 30 hour(s)).     Liver/Pancreas Enzyme Results Liver Function Results   No results found for this or any previous visit (from the past 30 hour(s)). No results found for this or any previous visit (from the past 30 hour(s)).     Cardiac Results Coags Results   No results found for this or any previous visit (from the past 30 hour(s)). No results found for this or  any previous visit (from the past 30 hour(s)).     Radiology:    Results for orders placed or performed during the hospital encounter of 05/07/20 (from the past 72 hour(s))   US KIDNEY     Status: None    Narrative    Theresa Greene  Female, 85 years old.    US KIDNEY performed on 05/09/2020 6:51 PM.    REASON FOR EXAM:  AKI    COMPARISON: None.    TECHNIQUE: Renal ultrasound.    FINDINGS:     The right kidney measures 8.3 x 4.2 cm. The cortical echogenicity is mildly  increased. No hydronephrosis is present. There is a small simple cyst at  the right lower pole measuring 1.1 x 1.0 x 1.1 cm. Color Doppler confirms  blood flow to the right kidney.    The left kidney measures 9.0 x 4.9 cm. The cortical echogenicity is mildly  increased. No hydronephrosis or focal renal mass is visible. Color Doppler  confirms blood flow to the left kidney.    The bladder is distended with an estimated volume of nearly 200 mL. No  bladder wall thickening is  visible.      Impression    1. Mild renal atrophy bilaterally without hydronephrosis.    2. Increased renal echogenicity bilaterally suggestive of medical renal  disease.    3. Approximately 1.1 cm simple right lower pole renal cyst.       XR AP MOBILE CHEST     Status: None    Narrative    XR AP MOBILE CHEST performed on 05/10/2020 7:01 AM    INDICATION: 85 years old Female; atelectasis    TECHNIQUE: 1 views of the chest; 1 images    COMPARISON: 05/09/2020.    FINDINGS: Left basilar atelectasis is present. Otherwise, no focal  consolidation, pleural effusion or pneumothorax. The cardiac selected is  within normal limits for size with no evidence of pulmonary edema. Right  chest pacemaker remains in place.        Impression    Left basilar atelectasis with no acute cardiopulmonary process.          PT/OT: Yes    Consults:   Consult Orders Placed This Encounter   Procedures    IP CONSULT TO CARDIOLOGY - ORDERING PROVIDER MUST CALL/PAGE SERVICE    IP CONSULT TO CARE MANAGEMENT -  EVAULATION S/P TRAUMA    IP CONSULT TO ADVANCED WOUND CARE TEAM    IP CARDIAC DEVICE INTERROGATION/PROGRAMMING - ORDERING PROVIDER MUST CALL/PAGE SERVICE    IP CONSULT TO METABOLIC BONE DISEASE SERVICE - ORDERING PROVIDER MUST CALL/PAGE SERVICE    IP CONSULT TO GERIATRIC MEDICINE - ORDERING PROVIDER MUST CALL/PAGE SERVICE    IP CONSULT TO NUTRITION SERVICES       Hardware (lines, foley's, tubes):   Patient Lines/Drains/Airways Status       Active Line / Dialysis Catheter / Dialysis Graft / Drain / Airway / Wound       Name Placement date Placement time Site Days    Peripheral IV Right Forearm 05/07/20  0534   5    Wound (Non-Surgical) Anterior;Left Thigh 05/07/20  1103   4    Wound (Non-Surgical) Anterior;Left;Lower Leg 05/07/20  1103   4    Wound (Non-Surgical) Anterior;Left Foot 05/07/20  0740   5    Surgical Incision Right;Upper Leg 05/07/20     5                          Theresa Kemmerer, PA-C      I personally saw and evaluated the patient. See APP's note for additional details. I agree with the findings and plan of care as documented in the APP's note. Any exceptions/additions are edited/noted. My findings/participation are:    S: No acute events overnight. She has no complaints and denies pain.  Says that she feels well to go home today.  Discussed with husband and nursing.    Review of Systems - All other systems reviewed and negative except as noted in subjective/HPI.    OCeasar Mons Vitals:    05/12/20 0755 05/12/20 0912 05/12/20 1129 05/12/20 1231   BP: (!) 186/82      Pulse: 69      Resp: 18  16 16    Temp: 37.3 C (99.1 F)      SpO2:  92%       Nontoxic appearing, regular rate rhythm, normal respiratory effort, abdomen soft, postop site clean    A/P:  Status post right hip surgery this admission.  Continue DVT prophylaxis for 3-4 weeks with orthopedic follow-up.  Vitamin-D supplementation.  Physical therapy.  Continue home medications.    Diamantina Providence, MD    Greater than 30 minutes was spent in the  discharge process including patient education, med reconciliation, and transitions of care.

## 2020-05-12 NOTE — Telephone Encounter (Signed)
I can't see this again. You have to copy and paste it, remember?

## 2020-05-12 NOTE — Care Management Notes (Signed)
[facility]  Care Management Note    Patient Name: Theresa Greene  Date of Birth: [birth year]  Sex: female  Date/Time of Admission: 05/07/2020  3:46 AM  Room/Bed: 20/A  Payor: MEDICARE / Plan: MEDICARE PART A AND B / Product Type: Medicare /    LOS: 5 days   Primary Care Providers:  Hill-Reinert, Holly, DO, DO (General)    Admitting Diagnosis:  Fall [W19.XXXA]    Assessment:      05/12/20 0936   Assessment Details   Assessment Type Continued Assessment   Date of Care Management Update 05/12/20   Date of Next DCP Update 05/13/20   Medicare Intent to Discharge Documentation   Discharge IMM give to: Patient   Discharge IMM Letter Given Date 05/12/20   Discharge IMM Letter Given Time 0920   IMM explained/reviewed with:  Patient;verbalized understanding   Care Management Plan   Discharge Planning Status plan in progress   Projected Discharge Date 05/12/20   Discharge plan discussed with: Patient;Spouse   Form for patient choice reviewed/signed and on chart yes   Facility or Agency Preferences [facility]   Discharge Needs Assessment   Discharge Facility/Level of Care Needs SNF Placement (Medicare certified)(code 3)   Transportation Available ambulance   Patient for discharge to SNF level of care today. Patient accepted and bed available today at [facility], per [place]. Patient and spouse agreeable to plans. Ambulance requested for 1:00 pm. IMM signed again and placed on chart. Morgan/Grace, RN updated to plans. COVID screen in process. [facility] PAS approved.     Discharge Plan:  SNF Placement (Medicare certified) (code 3)      The patient will continue to be evaluated for developing discharge needs.     Case Manager: Heber Carolina, LGSW  Phone: [phone]

## 2020-05-12 NOTE — Nurses Notes (Signed)
Report given to Galateo at [facility] center. All questions answered at this time. Call back number left.
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
## 2020-05-12 NOTE — Discharge Instructions (Signed)
Discharge Recommendations/ Plan:Discharge to:SNF Placement (Medicare certified) (code 3)      Resources:[facility] [phone] ask for Room 309 RN.

## 2020-05-13 DIAGNOSIS — R262 Difficulty in walking, not elsewhere classified: Secondary | ICD-10-CM | POA: Insufficient documentation

## 2020-05-13 NOTE — Care Management Notes (Signed)
Referral Information  ++++++ Placed Provider #1 ++++++  Case Manager: Adria Dill  Provider Type: Nursing Home/SNF  Provider Name: [facility]  Address:  [address]  [place], [place] [address]  Contact: Flint Melter    Phone: [phone] x  Fax:   Fax: [phone]

## 2020-05-13 NOTE — Telephone Encounter (Signed)
I attempted to call the patient LM on VM . Upon investigating I found hat she is still currently in [facility] and hat the appt was made to come see you as a new pt they cxl'd appt due to patient sill being admitted into [facility].

## 2020-05-13 NOTE — Care Management Notes (Signed)
Referral Information  ++++++ Placed Provider #1 ++++++  Case Manager: Alyssa Golub  Provider Type: Acute -Rehab  Address:  ,    Contact:    Fax:   Fax:
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
## 2020-05-17 ENCOUNTER — Telehealth (HOSPITAL_BASED_OUTPATIENT_CLINIC_OR_DEPARTMENT_OTHER): Payer: Self-pay | Admitting: Surgical

## 2020-05-17 NOTE — Telephone Encounter (Signed)
Dr. Graciela Husbands patient-     Theresa Greene from [facility] nursing home states patient is needing to follow up with Dr. Graciela Husbands for a 2 wk ORIF R ppx distal femur.   He is asking if it is at all possible if the nurses could remove any sutures at the facility and if patient could do a video visit?   He states it is difficult to transport the patient due to distance, they are 3 hours each way.   Please advise.   Thank you.       Spoke with [employer]. This patient needs to be seen in person at least once post operatively. If they cannot arrange transportation for this Thursday, we can move her appt to next Thursday. He will let me know when transport is available. Once she is seen in person, we will try to find a surgeon closer to her home to take over her post op care.   Milton Ferguson, PA-C  05/17/2020, 11:23

## 2020-05-18 ENCOUNTER — Telehealth (HOSPITAL_BASED_OUTPATIENT_CLINIC_OR_DEPARTMENT_OTHER): Payer: Self-pay | Admitting: Surgical

## 2020-05-18 ENCOUNTER — Telehealth (INDEPENDENT_AMBULATORY_CARE_PROVIDER_SITE_OTHER): Payer: Self-pay | Admitting: Specialist

## 2020-05-18 NOTE — Telephone Encounter (Signed)
[facility]    Dr. Graciela Husbands pt     Delbert called from [facility], stating that someone just called from Dr. Odessa Fleming office stating that the patient would follow up with another provider.     The nursing home has apparently been going back and forth with the office since yesterday, because they don't want the patient to come all the way to [facility] to follow up, but the last thing I see documented is still that Adelina Mings said the patient had to follow up once and then could be referred out.     Ronald Pippins is saying that he received a note from their secretary that the patient would see Dr. Katherene Ponto in [place] to have the sutures removed. If you could, please try to call Delbert back at [phone], and if the patient is to follow up with another provider to get the sutures removed, Delbert is asking for confirmation that the patient doesn't need to come back to [facility] for an appointment, and to know if the patient has already been scheduled with another provider and when, or do they need to call them? Please try Delbert again. Thanks!           Spoke with [facility]. Made them aware Dr. Graciela Husbands has corresponded with Dr. Katherene Ponto, who agreed to follow this patient post operatively. The patient does not need to follow up in [place].  Milton Ferguson, PA-C  05/18/2020, 10:51

## 2020-05-18 NOTE — Telephone Encounter (Signed)
-----   Message from Antoine Primas, MD sent at 05/18/2020  6:34 AM EDT -----  Regarding: FW: periprosthetic femur fracture  Can I or someone see her  ----- Message -----  From: Jeanell Sparrow, MD  Sent: 05/17/2020   5:18 PM EDT  To: Milton Ferguson, PA-C, Antoine Primas, MD  Subject: periprosthetic femur fracture                    Trey Paula, this patient is recovering from ORIF of a periprosthetic femur fracture. She is in [place]. Would you be able to see her and decide if staples could be removed?  Apparently it is a long trip up here from [place].  Appreciate the help  Thanks  AK

## 2020-05-18 NOTE — Telephone Encounter (Signed)
Mynde will call patient to scheduled follow up appointment as requested

## 2020-05-24 ENCOUNTER — Ambulatory Visit (INDEPENDENT_AMBULATORY_CARE_PROVIDER_SITE_OTHER): Payer: Medicare Other | Admitting: Rehabilitative and Restorative Service Providers"

## 2020-05-24 ENCOUNTER — Other Ambulatory Visit: Payer: Self-pay

## 2020-05-24 VITALS — Ht 66.0 in | Wt 133.0 lb

## 2020-05-24 DIAGNOSIS — Z96651 Presence of right artificial knee joint: Secondary | ICD-10-CM

## 2020-05-24 DIAGNOSIS — S9002XA Contusion of left ankle, initial encounter: Secondary | ICD-10-CM

## 2020-05-24 DIAGNOSIS — Z09 Encounter for follow-up examination after completed treatment for conditions other than malignant neoplasm: Secondary | ICD-10-CM

## 2020-05-24 DIAGNOSIS — S7002XA Contusion of left hip, initial encounter: Secondary | ICD-10-CM

## 2020-05-24 DIAGNOSIS — M9711XD Periprosthetic fracture around internal prosthetic right knee joint, subsequent encounter: Secondary | ICD-10-CM

## 2020-05-24 DIAGNOSIS — M80051D Age-related osteoporosis with current pathological fracture, right femur, subsequent encounter for fracture with routine healing: Secondary | ICD-10-CM
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
## 2020-06-17 ENCOUNTER — Encounter (INDEPENDENT_AMBULATORY_CARE_PROVIDER_SITE_OTHER): Payer: Self-pay | Admitting: Surgery

## 2020-06-17 DIAGNOSIS — Z7901 Long term (current) use of anticoagulants: Secondary | ICD-10-CM

## 2020-06-17 DIAGNOSIS — S8010XA Contusion of unspecified lower leg, initial encounter: Secondary | ICD-10-CM | POA: Insufficient documentation

## 2020-06-17 HISTORY — DX: Long term (current) use of anticoagulants: Z79.01
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
## 2020-06-20 ENCOUNTER — Ambulatory Visit: Payer: Medicare Other | Attending: Surgery | Admitting: Surgery

## 2020-06-20 ENCOUNTER — Encounter (INDEPENDENT_AMBULATORY_CARE_PROVIDER_SITE_OTHER): Payer: Self-pay | Admitting: Surgery

## 2020-06-20 ENCOUNTER — Other Ambulatory Visit: Payer: Self-pay

## 2020-06-20 ENCOUNTER — Ambulatory Visit: Payer: Medicare Other | Attending: Surgery

## 2020-06-20 VITALS — BP 137/78 | HR 79 | Temp 98.7°F | Resp 18 | Ht 66.0 in | Wt 133.0 lb

## 2020-06-20 DIAGNOSIS — E1122 Type 2 diabetes mellitus with diabetic chronic kidney disease: Secondary | ICD-10-CM | POA: Insufficient documentation

## 2020-06-20 DIAGNOSIS — D649 Anemia, unspecified: Secondary | ICD-10-CM | POA: Insufficient documentation

## 2020-06-20 DIAGNOSIS — K219 Gastro-esophageal reflux disease without esophagitis: Secondary | ICD-10-CM | POA: Insufficient documentation

## 2020-06-20 DIAGNOSIS — N183 Chronic kidney disease, stage 3 unspecified: Secondary | ICD-10-CM | POA: Insufficient documentation

## 2020-06-20 DIAGNOSIS — Z7902 Long term (current) use of antithrombotics/antiplatelets: Secondary | ICD-10-CM | POA: Insufficient documentation

## 2020-06-20 DIAGNOSIS — E119 Type 2 diabetes mellitus without complications: Secondary | ICD-10-CM

## 2020-06-20 DIAGNOSIS — S82891D Other fracture of right lower leg, subsequent encounter for closed fracture with routine healing: Secondary | ICD-10-CM | POA: Insufficient documentation

## 2020-06-20 DIAGNOSIS — S82892D Other fracture of left lower leg, subsequent encounter for closed fracture with routine healing: Secondary | ICD-10-CM | POA: Insufficient documentation

## 2020-06-20 DIAGNOSIS — T25322D Burn of third degree of left foot, subsequent encounter: Secondary | ICD-10-CM

## 2020-06-20 DIAGNOSIS — S8010XA Contusion of unspecified lower leg, initial encounter: Secondary | ICD-10-CM

## 2020-06-20 DIAGNOSIS — I2510 Atherosclerotic heart disease of native coronary artery without angina pectoris: Secondary | ICD-10-CM | POA: Insufficient documentation

## 2020-06-20 DIAGNOSIS — E785 Hyperlipidemia, unspecified: Secondary | ICD-10-CM | POA: Insufficient documentation

## 2020-06-20 DIAGNOSIS — M069 Rheumatoid arthritis, unspecified: Secondary | ICD-10-CM | POA: Insufficient documentation

## 2020-06-20 DIAGNOSIS — Z79899 Other long term (current) drug therapy: Secondary | ICD-10-CM | POA: Insufficient documentation

## 2020-06-20 DIAGNOSIS — S81802A Unspecified open wound, left lower leg, initial encounter: Secondary | ICD-10-CM

## 2020-06-20 DIAGNOSIS — Z8673 Personal history of transient ischemic attack (TIA), and cerebral infarction without residual deficits: Secondary | ICD-10-CM | POA: Insufficient documentation

## 2020-06-20 DIAGNOSIS — Z8711 Personal history of peptic ulcer disease: Secondary | ICD-10-CM | POA: Insufficient documentation

## 2020-06-20 DIAGNOSIS — Z7901 Long term (current) use of anticoagulants: Secondary | ICD-10-CM

## 2020-06-20 DIAGNOSIS — I480 Paroxysmal atrial fibrillation: Secondary | ICD-10-CM | POA: Insufficient documentation

## 2020-06-20 DIAGNOSIS — E1140 Type 2 diabetes mellitus with diabetic neuropathy, unspecified: Secondary | ICD-10-CM | POA: Insufficient documentation

## 2020-06-20 DIAGNOSIS — I129 Hypertensive chronic kidney disease with stage 1 through stage 4 chronic kidney disease, or unspecified chronic kidney disease: Secondary | ICD-10-CM | POA: Insufficient documentation

## 2020-06-20 DIAGNOSIS — Z950 Presence of cardiac pacemaker: Secondary | ICD-10-CM | POA: Insufficient documentation

## 2020-06-20 DIAGNOSIS — W19XXXD Unspecified fall, subsequent encounter: Secondary | ICD-10-CM | POA: Insufficient documentation

## 2020-06-20 LAB — H & H
HCT: 37.5 % (ref 34.8–46.0)
HGB: 11.8 g/dL (ref 11.5–16.0)

## 2020-06-20 MED ORDER — ASCORBIC ACID (VITAMIN C) 500 MG TABLET
500.0000 mg | ORAL_TABLET | Freq: Every day | ORAL | 2 refills | Status: DC
Start: 2020-06-20 — End: 2020-07-19

## 2020-06-20 MED ORDER — FERROUS SULFATE 324 MG (65 MG IRON) TABLET,DELAYED RELEASE
324.0000 mg | DELAYED_RELEASE_TABLET | Freq: Every morning | ORAL | 3 refills | Status: DC
Start: 2020-06-20 — End: 2021-01-31

## 2020-06-20 MED ORDER — PSYLLIUM ORAL PACKET WRAPPER
1.0000 | PACK | Freq: Two times a day (BID) | Status: DC
Start: 2020-06-20 — End: 2021-01-31

## 2020-06-20 NOTE — Result Encounter Note (Signed)
Please inform the patient that their labs    Good for surgery

## 2020-06-29 ENCOUNTER — Encounter (INDEPENDENT_AMBULATORY_CARE_PROVIDER_SITE_OTHER): Payer: Self-pay | Admitting: Specialist

## 2020-06-29 DIAGNOSIS — S72401A Unspecified fracture of lower end of right femur, initial encounter for closed fracture: Secondary | ICD-10-CM

## 2020-06-30 ENCOUNTER — Ambulatory Visit (INDEPENDENT_AMBULATORY_CARE_PROVIDER_SITE_OTHER): Payer: Medicare Other | Admitting: Rehabilitative and Restorative Service Providers"

## 2020-06-30 ENCOUNTER — Encounter (INDEPENDENT_AMBULATORY_CARE_PROVIDER_SITE_OTHER): Payer: Self-pay | Admitting: Rehabilitative and Restorative Service Providers"

## 2020-06-30 ENCOUNTER — Other Ambulatory Visit: Payer: Self-pay

## 2020-06-30 ENCOUNTER — Ambulatory Visit
Admission: RE | Admit: 2020-06-30 | Discharge: 2020-06-30 | Disposition: A | Discharge status: Home / Self Care | Payer: No Typology Code available for payment source | Source: Ambulatory Visit | Attending: Rehabilitative and Restorative Service Providers" | Admitting: Rehabilitative and Restorative Service Providers"

## 2020-06-30 DIAGNOSIS — Z9889 Other specified postprocedural states: Secondary | ICD-10-CM

## 2020-06-30 DIAGNOSIS — Z8781 Personal history of (healed) traumatic fracture: Secondary | ICD-10-CM

## 2020-06-30 DIAGNOSIS — S72401A Unspecified fracture of lower end of right femur, initial encounter for closed fracture: Secondary | ICD-10-CM
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
## 2020-07-04 ENCOUNTER — Ambulatory Visit (INDEPENDENT_AMBULATORY_CARE_PROVIDER_SITE_OTHER): Payer: Self-pay | Admitting: Specialist

## 2020-07-04 NOTE — Telephone Encounter (Signed)
Note faxed as requested

## 2020-07-04 NOTE — Telephone Encounter (Signed)
Theresa Greene from [facility] called and wanted to know if patient can be weight bearing as tolerated as she is having no pain. She said you can either fax a order stating that to [phone] Attention Physical Therapy/Nicole or you can call her on her cell phone at [phone]. Thank you!

## 2020-07-11 ENCOUNTER — Ambulatory Visit (INDEPENDENT_AMBULATORY_CARE_PROVIDER_SITE_OTHER): Payer: Self-pay | Admitting: Specialist

## 2020-07-11 NOTE — Telephone Encounter (Signed)
Spoke to [facility] [facility] @ [address]- Ok to progress patient from toe touch to full weight bearing as her pain dictates.  She requested a written note be faxed to her @ [phone].  Note faxed

## 2020-07-11 NOTE — Telephone Encounter (Signed)
[employer] called and had questions about patients weight bearing status. [phone] ask for Therapy. Thank you.
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
## 2020-07-19 ENCOUNTER — Other Ambulatory Visit: Payer: Self-pay

## 2020-07-19 ENCOUNTER — Ambulatory Visit
Admission: RE | Admit: 2020-07-19 | Discharge: 2020-07-19 | Disposition: A | Discharge status: Home / Self Care | Payer: Medicare Other | Source: Ambulatory Visit | Attending: Internal Medicine | Admitting: Internal Medicine

## 2020-07-19 ENCOUNTER — Encounter (INDEPENDENT_AMBULATORY_CARE_PROVIDER_SITE_OTHER): Payer: Self-pay | Admitting: Internal Medicine

## 2020-07-19 ENCOUNTER — Telehealth (INDEPENDENT_AMBULATORY_CARE_PROVIDER_SITE_OTHER): Payer: Self-pay | Admitting: Internal Medicine

## 2020-07-19 ENCOUNTER — Ambulatory Visit (HOSPITAL_COMMUNITY): Payer: Medicare Other

## 2020-07-19 ENCOUNTER — Other Ambulatory Visit (INDEPENDENT_AMBULATORY_CARE_PROVIDER_SITE_OTHER): Payer: Self-pay | Admitting: Internal Medicine

## 2020-07-19 ENCOUNTER — Ambulatory Visit: Payer: Medicare Other | Attending: Internal Medicine | Admitting: Internal Medicine

## 2020-07-19 VITALS — BP 115/70 | HR 70 | Temp 96.7°F | Resp 18 | Ht 66.5 in | Wt 128.8 lb

## 2020-07-19 DIAGNOSIS — M25571 Pain in right ankle and joints of right foot: Secondary | ICD-10-CM | POA: Insufficient documentation

## 2020-07-19 DIAGNOSIS — Z7902 Long term (current) use of antithrombotics/antiplatelets: Secondary | ICD-10-CM | POA: Insufficient documentation

## 2020-07-19 DIAGNOSIS — D509 Iron deficiency anemia, unspecified: Secondary | ICD-10-CM | POA: Insufficient documentation

## 2020-07-19 DIAGNOSIS — I10 Essential (primary) hypertension: Secondary | ICD-10-CM

## 2020-07-19 DIAGNOSIS — R7989 Other specified abnormal findings of blood chemistry: Secondary | ICD-10-CM

## 2020-07-19 DIAGNOSIS — E782 Mixed hyperlipidemia: Secondary | ICD-10-CM | POA: Insufficient documentation

## 2020-07-19 DIAGNOSIS — N183 Chronic kidney disease, stage 3 unspecified (CMS HCC): Secondary | ICD-10-CM | POA: Insufficient documentation

## 2020-07-19 DIAGNOSIS — Z13820 Encounter for screening for osteoporosis: Secondary | ICD-10-CM | POA: Insufficient documentation

## 2020-07-19 DIAGNOSIS — I4891 Unspecified atrial fibrillation: Secondary | ICD-10-CM

## 2020-07-19 DIAGNOSIS — M85871 Other specified disorders of bone density and structure, right ankle and foot: Secondary | ICD-10-CM

## 2020-07-19 DIAGNOSIS — E1122 Type 2 diabetes mellitus with diabetic chronic kidney disease: Secondary | ICD-10-CM | POA: Insufficient documentation

## 2020-07-19 DIAGNOSIS — K219 Gastro-esophageal reflux disease without esophagitis: Secondary | ICD-10-CM | POA: Insufficient documentation

## 2020-07-19 DIAGNOSIS — Z79899 Other long term (current) drug therapy: Secondary | ICD-10-CM | POA: Insufficient documentation

## 2020-07-19 DIAGNOSIS — E119 Type 2 diabetes mellitus without complications: Secondary | ICD-10-CM

## 2020-07-19 DIAGNOSIS — I480 Paroxysmal atrial fibrillation: Secondary | ICD-10-CM | POA: Insufficient documentation

## 2020-07-19 DIAGNOSIS — I129 Hypertensive chronic kidney disease with stage 1 through stage 4 chronic kidney disease, or unspecified chronic kidney disease: Secondary | ICD-10-CM | POA: Insufficient documentation

## 2020-07-19 LAB — CBC
HCT: 41.1 % (ref 34.8–46.0)
HGB: 13.1 g/dL (ref 11.5–16.0)
MCH: 29.8 pg (ref 26.0–32.0)
MCHC: 31.9 g/dL (ref 31.0–35.5)
MCV: 93.4 fL (ref 78.0–100.0)
MPV: 10 fL (ref 8.7–12.5)
PLATELETS: 446 10*3/uL — ABNORMAL HIGH (ref 150–400)
RBC: 4.4 10*6/uL (ref 3.85–5.22)
RDW-CV: 15.9 % — ABNORMAL HIGH (ref 11.5–15.5)
WBC: 11.3 10*3/uL — ABNORMAL HIGH (ref 3.7–11.0)

## 2020-07-19 LAB — FERRITIN: FERRITIN: 144 ng/mL (ref 13–150)

## 2020-07-19 LAB — COMPREHENSIVE METABOLIC PANEL, NON-FASTING
ALBUMIN/GLOBULIN RATIO: 1.5 (ref 1.0–2.0)
ALBUMIN: 4.3 g/dL (ref 3.5–5.2)
ALKALINE PHOSPHATASE: 282 U/L — ABNORMAL HIGH (ref 35–104)
ALT (SGPT): 62 U/L — ABNORMAL HIGH (ref 0–33)
ANION GAP: 16 mmol/L (ref 10–21)
AST (SGOT): 44 U/L — ABNORMAL HIGH (ref 0–32)
BILIRUBIN TOTAL: 0.5 mg/dL (ref 0.0–1.0)
BUN/CREA RATIO: 17
BUN: 22 mg/dL — ABNORMAL HIGH (ref 6–20)
CALCIUM: 9.9 mg/dL (ref 8.4–10.5)
CHLORIDE: 107 mmol/L (ref 96–107)
CO2 TOTAL: 19 mmol/L — ABNORMAL LOW (ref 22–29)
CREATININE: 1.28 mg/dL — ABNORMAL HIGH (ref 0.50–0.90)
ESTIMATED GFR: 39 mL/min/{1.73_m2} — ABNORMAL LOW (ref >60)
GLUCOSE: 107 mg/dL — ABNORMAL HIGH (ref 74–106)
POTASSIUM: 5 mmol/L (ref 3.5–5.1)
PROTEIN TOTAL: 7.1 g/dL (ref 6.6–8.7)
SODIUM: 142 mmol/L (ref 136–145)

## 2020-07-19 LAB — LIPID PANEL
CHOLESTEROL: 157 mg/dL (ref 100–170)
HDL CHOL: 71 mg/dL — ABNORMAL HIGH (ref 30–65)
LDL CALC: 65 mg/dL (ref 0–130)
TRIGLYCERIDES: 104 mg/dL (ref 0–200)
VLDL CALC: 21 mg/dL (ref 5–40)

## 2020-07-19 LAB — THYROID STIMULATING HORMONE WITH FREE T4 REFLEX: TSH: 0.928 u[IU]/mL (ref 0.270–4.200)

## 2020-07-19 LAB — VITAMIN D 25 TOTAL: VITAMIN D 25, TOTAL: 77.46 ng/mL (ref 30.00–100.00)

## 2020-07-19 MED ORDER — FLUOROURACIL 5 % TOPICAL CREAM
TOPICAL_CREAM | CUTANEOUS | Status: DC
Start: 2020-07-19 — End: 2020-11-03

## 2020-07-19 MED ORDER — CREON 36,000 UNIT-114,000 UNIT-180,000 UNIT CAPSULE,DELAYED RELEASE
1.0000 | DELAYED_RELEASE_CAPSULE | Freq: Every day | ORAL | 0 refills | Status: AC
Start: 2020-07-19 — End: 2020-10-17

## 2020-07-19 MED ORDER — DEXLANSOPRAZOLE 60 MG CAPSULE,BIPHASE DELAYED RELEASE
60.0000 mg | DELAYED_RELEASE_CAPSULE | Freq: Every day | ORAL | 3 refills | Status: DC
Start: 2020-07-19 — End: 2020-09-21

## 2020-07-19 MED ORDER — GABAPENTIN 100 MG CAPSULE
100.0000 mg | ORAL_CAPSULE | Freq: Three times a day (TID) | ORAL | 0 refills | Status: DC | PRN
Start: 2020-07-19 — End: 2020-07-19

## 2020-07-19 MED ORDER — GABAPENTIN 100 MG CAPSULE
100.0000 mg | ORAL_CAPSULE | Freq: Two times a day (BID) | ORAL | 0 refills | Status: DC
Start: 2020-07-19 — End: 2021-01-25

## 2020-07-19 MED ORDER — CLOPIDOGREL 75 MG TABLET
75.0000 mg | ORAL_TABLET | Freq: Every day | ORAL | 3 refills | Status: DC
Start: 2020-07-19 — End: 2020-10-25

## 2020-07-19 NOTE — Nursing Note (Signed)
Travel Screening     Question   Response    In the last month, have you been in contact with someone who was confirmed or suspected to have Coronavirus / COVID-19?  No / Unsure    Have you had a COVID-19 viral test in the last 14 days?  No    Do you have any of the following new or worsening symptoms?  None of these    Have you traveled internationally or domestically in the last month?  No      Travel History   Travel since 06/19/20     No documented travel since 06/19/20

## 2020-07-19 NOTE — Telephone Encounter (Signed)
Spoke with Keandrea about her results, she had no further questions or concerns at this time. Celedonio Savage, LPN.

## 2020-07-20 LAB — IRON TRANSFERRIN AND TIBC
IRON (TRANSFERRIN) SATURATION: 15 % — ABNORMAL LOW (ref 20–50)
IRON: 46 ug/dL (ref 45–170)
TOTAL IRON BINDING CAPACITY: 304 ug/dL (ref 224–476)
TRANSFERRIN: 217 mg/dL (ref 160–340)

## 2020-07-20 LAB — PARATHYROID HORMONE (PTH): PTH: 75.3 pg/mL (ref 8.5–77.0)

## 2020-07-22 ENCOUNTER — Encounter (INDEPENDENT_AMBULATORY_CARE_PROVIDER_SITE_OTHER): Payer: Self-pay | Admitting: Surgery
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
## 2020-07-25 ENCOUNTER — Encounter (INDEPENDENT_AMBULATORY_CARE_PROVIDER_SITE_OTHER): Payer: Self-pay | Admitting: Surgery

## 2020-07-27 ENCOUNTER — Other Ambulatory Visit: Payer: Self-pay

## 2020-07-27 ENCOUNTER — Encounter (INDEPENDENT_AMBULATORY_CARE_PROVIDER_SITE_OTHER): Payer: Self-pay | Admitting: Surgery

## 2020-07-27 ENCOUNTER — Telehealth (INDEPENDENT_AMBULATORY_CARE_PROVIDER_SITE_OTHER): Payer: Self-pay | Admitting: Internal Medicine

## 2020-07-27 ENCOUNTER — Ambulatory Visit: Payer: Medicare Other | Attending: Surgery | Admitting: Surgery

## 2020-07-27 VITALS — BP 110/77 | HR 92 | Temp 98.2°F | Resp 18 | Ht 66.5 in | Wt 128.0 lb

## 2020-07-27 DIAGNOSIS — D509 Iron deficiency anemia, unspecified: Secondary | ICD-10-CM | POA: Insufficient documentation

## 2020-07-27 DIAGNOSIS — Z79899 Other long term (current) drug therapy: Secondary | ICD-10-CM | POA: Insufficient documentation

## 2020-07-27 DIAGNOSIS — S7002XD Contusion of left hip, subsequent encounter: Secondary | ICD-10-CM | POA: Insufficient documentation

## 2020-07-27 DIAGNOSIS — S8010XA Contusion of unspecified lower leg, initial encounter: Secondary | ICD-10-CM

## 2020-07-27 DIAGNOSIS — K5900 Constipation, unspecified: Secondary | ICD-10-CM | POA: Insufficient documentation

## 2020-07-27 NOTE — Telephone Encounter (Signed)
Called to  [employer] of her dexa scan coming up on 08/24/20 @1 :45 and what she needed to do to prepare for her scan. She had no further questions at this time. , LPN  Celedonio Savage, 15:47

## 2020-08-10 ENCOUNTER — Other Ambulatory Visit: Payer: Self-pay

## 2020-08-10 ENCOUNTER — Telehealth (INDEPENDENT_AMBULATORY_CARE_PROVIDER_SITE_OTHER): Payer: Self-pay | Admitting: Internal Medicine

## 2020-08-10 ENCOUNTER — Encounter (INDEPENDENT_AMBULATORY_CARE_PROVIDER_SITE_OTHER): Payer: Self-pay | Admitting: Specialist

## 2020-08-10 ENCOUNTER — Ambulatory Visit (INDEPENDENT_AMBULATORY_CARE_PROVIDER_SITE_OTHER): Payer: Medicare Other | Admitting: Specialist

## 2020-08-10 VITALS — Ht 66.0 in | Wt 128.0 lb

## 2020-08-10 DIAGNOSIS — I10 Essential (primary) hypertension: Secondary | ICD-10-CM

## 2020-08-10 DIAGNOSIS — Z8781 Personal history of (healed) traumatic fracture: Secondary | ICD-10-CM

## 2020-08-10 DIAGNOSIS — T84010D Broken internal right hip prosthesis, subsequent encounter: Secondary | ICD-10-CM

## 2020-08-10 NOTE — Progress Notes (Signed)
[facility]  [address]  [facility] [address]    Progress Note    Name: Theresa Greene MRN:  [id number]   Date: 08/10/2020 Age: 85 y.o.     Reason for Visit: Re-eval (open reduction internal fixation intraprosthetic distal femur fracture 05/07/2020)    History of Present Illness:  Theresa Greene is a 85 y.o. female who is status post open reduction internal fixation of a periprosthetic distal femur fracture.  Date of fracture and surgery was 05/07/2020.  Today patient states that she is doing well, and has been doing nonweightbearing physical therapy since the previous visit.  She denies any issues with her incisions.  She states that she is not having any pain, she rates her pain as 0/10.  She denies any numbness or tingling in the right lower extremity.  Patient denies any other complaints at this time.    Medical History   I have reviewed and updated as appropriate the past medical, surgical, family and social history today:    Past Medical History:   Diagnosis Date    CAD S/P percutaneous coronary angioplasty 08/06/2014    Formatting of this note might be different from the original. Pt reports 5 years ago    Charcot's joint of foot, left 08/06/2014    Gastroesophageal reflux disease 08/06/2014    History of cardiac pacemaker 08/06/2014    History of CVA (cerebrovascular accident) 08/06/2014    Formatting of this note might be different from the original. 1995, no residual deficits    HTN (hypertension)     Hyperlipidemia 12/25/2019    Neuropathy (CMS HCC)     Paroxysmal atrial fibrillation (CMS HCC) 08/06/2014    PUD (peptic ulcer disease) 08/06/2014    Formatting of this note might be different from the original. C/b GIB Spring 2015    Rheumatoid arthritis (CMS HCC) 08/06/2014    Stage 3 chronic kidney disease (CMS HCC) 12/25/2019    Last Assessment & Plan:  Formatting of this note might be different from the original. Renal function stable, at  baseline Stable electrolytes; restart sodium bicarb 650 mg BID  Continue renal protective ARB Discussed CKD risk factor modifications, including tight BP/glucose control, low protein diet, and avoidance of NSAIDs    Type 2 diabetes mellitus (CMS HCC) 08/06/2014    Vitamin D deficiency 12/25/2019          Past Surgical History:   Procedure Laterality Date    CORONARY ARTERY ANGIOPLASTY  2011    HX BACK SURGERY      lower back x 3    HX HIP REPLACEMENT Right     HX NISSEN FUNDOPLICATION  2002    then three others to repair problems    HX OPEN CHOLECYSTECTOMY  1962    with common duxt exploration    HX PACEMAKER INSERTION  2019    KNEE ARTHROPLASTY Right     OPEN REDUCTION INTERNAL FIXATION FRACTURE FEMUR Right 05/07/2020    Performed by Jeanell Sparrow, MD at [facility] OR [facility]TH    PANCREATIC PSEUDOCYST DRAINAGE  1971    TOTAL ABDOMINAL HYSTERECTOMY       Current Outpatient Medications   Medication Sig    amiodarone (PACERONE) 200 mg Oral Tablet Take 200 mg by mouth Once a day    atorvastatin (LIPITOR) 20 mg Oral Tablet Take 20 mg by mouth Once a day    clopidogreL (PLAVIX) 75 mg Oral Tablet Take 1 Tablet (75 mg  total) by mouth Once a day for 90 days    Dexlansoprazole (DEXILANT) 60 mg Oral Cap, Delayed Rel., Multiphasic Take 1 Capsule (60 mg total) by mouth Once a day for 90 days    dorzolamide-timoloL (COSOPT) 22.3-6.8 mg/mL Ophthalmic Drops Instill 2 Drops into right eye Once a day    ergocalciferol, vitamin D2, (DRISDOL) 1,250 mcg (50,000 unit) Oral Capsule Take 50,000 Units by mouth Every 7 days    ezetimibe (ZETIA) 10 mg Oral Tablet Once a day    ferrous sulfate (FERATAB) 324 mg (65 mg iron) Oral Tablet, Delayed Release (E.C.) Take 1 Tablet (324 mg total) by mouth Every morning before breakfast Take with 500 mg vit c on empty stomach.  Then nothing to eat for 45 minutes.    fluorouraciL (EFUDEX) 5 % Cream Apply to Right Temple Area Topically Every Day    gabapentin (NEURONTIN) 100 mg Oral  Capsule Take 1 Capsule (100 mg total) by mouth Twice daily for 90 days    hydrALAZINE (APRESOLINE) 25 mg Oral Tablet Take 25 mg by mouth Twice daily    lipase-protease-amylase (CREON) 36,000-114,000- 180,000 unit Oral Capsule, Delayed Release(E.C.) Take 1 Capsule by mouth Once a day for 90 days    losartan (COZAAR) 50 mg Oral Tablet Take 50 mg by mouth Once a day    metoprolol tartrate (LOPRESSOR) 25 mg Oral Tablet Take 25 mg by mouth Once a day    NIFEdipine (PROCARDIA XL) 30 mg Oral Tablet Extended Rel 24 hr (2) Take 30 mg by mouth 2X/day    psyllium (METAMUCIL) Packet Take 1 Packet by mouth Twice daily    sodium bicarbonate 650 mg Oral Tablet Take 650 mg by mouth 2X/day    sucralfate (CARAFATE) 1 gram Oral Tablet Four times a day - before meals and bedtime     Allergies   Allergen Reactions    Iodinated Contrast Media Anaphylaxis    Etodolac NO Steroids unless approved by Attending Physician    Codeine  Other Adverse Reaction (Add comment) and Nausea/ Vomiting     Stomach pain  Stomach pain       Family Medical History:     Problem Relation (Age of Onset)    Ovarian Cancer Mother    Pancreatic Cancer Father          Social History     Tobacco Use    Smoking status: Never Smoker    Smokeless tobacco: Never Used   Vaping Use    Vaping Use: Never used   Substance Use Topics    Alcohol use: Never    Drug use: Never       Review of Systems:   Constitutional: no fever, chills, or unexpected weight loss or weight gain   HEENT: no vision problem, earaches or sore throat  Respiratory: no cough or wheeze  Cardiovascular: no chest pain, palpitations or edema  Gastrointestinal: no abdominal pain, nausea, vomiting or diarrhea   Neurological: no headaches or weakness  Psych: no anxiety or depression  All other ROS Negative    Objective     Physical Exam:  Ht 1.676 m (5\' 6" )    Wt 58.1 kg (128 lb)    BMI 20.66 kg/m     Constitutional: Patient is oriented to person, place, and time and well-developed,  well-nourished, and in no distress    Musculoskeletal:  incisions fully healed.  No palpable tenderness over the length of the femur.  Negative FABER and FADIR test.  Passive range  of motion of the right knee 5 to 115. R knee ligamentously stable.      Assessment     Status post open reduction internal fixation distal femur periprosthetic fracture  ENCOUNTER DIAGNOSES     ICD-10-CM   1. Status post open reduction and internal fixation (ORIF) of fracture  Z98.890    Z87.81       Plan     Pt is told to use walker as needed for ambulatory assistance.  Call us if she has any problems or concerns.        I am scribing for, and in the presence of, Dr. Antoine Primas for services provided on 08/10/2020.  Enis Slipper, SCRIBE   [place], [place]  08/10/2020, 10:31    I personally performed the services described in this documentation, as scribed  in my presence, and it is both accurate  and complete.    Antoine Primas, MD  08/10/2020, 11:19      This note was partially generated using MModal Fluency Direct system, and there may be some incorrect words, spellings, and punctuation that were not noted in checking the note before saving.

## 2020-08-10 NOTE — Telephone Encounter (Signed)
Theresa Greene (PT) with [facility] left a VM stating she was unable to see pt today due to pt having an unknown appt in [place].     Thanks  Rodney Booze, LPN  85/90/9311, 14:06

## 2020-08-24 ENCOUNTER — Other Ambulatory Visit: Payer: Self-pay

## 2020-08-24 ENCOUNTER — Ambulatory Visit
Admission: RE | Admit: 2020-08-24 | Discharge: 2020-08-24 | Disposition: A | Discharge status: Home / Self Care | Payer: Medicare Other | Source: Ambulatory Visit | Attending: Internal Medicine | Admitting: Internal Medicine

## 2020-08-24 ENCOUNTER — Ambulatory Visit (HOSPITAL_COMMUNITY): Payer: Medicare Other

## 2020-08-24 DIAGNOSIS — N183 Chronic kidney disease, stage 3 unspecified (CMS HCC): Secondary | ICD-10-CM

## 2020-08-24 DIAGNOSIS — D631 Anemia in chronic kidney disease: Secondary | ICD-10-CM | POA: Insufficient documentation

## 2020-08-24 DIAGNOSIS — N951 Menopausal and female climacteric states: Secondary | ICD-10-CM | POA: Insufficient documentation

## 2020-08-24 DIAGNOSIS — Z13820 Encounter for screening for osteoporosis: Secondary | ICD-10-CM

## 2020-08-24 DIAGNOSIS — M8588 Other specified disorders of bone density and structure, other site: Secondary | ICD-10-CM

## 2020-08-24 DIAGNOSIS — I129 Hypertensive chronic kidney disease with stage 1 through stage 4 chronic kidney disease, or unspecified chronic kidney disease: Secondary | ICD-10-CM

## 2020-08-24 DIAGNOSIS — N189 Chronic kidney disease, unspecified: Secondary | ICD-10-CM | POA: Insufficient documentation

## 2020-08-24 LAB — CBC WITH DIFF
BASOPHIL #: <0.1 10*3/uL (ref <=0.20)
BASOPHIL %: 0 %
EOSINOPHIL #: 0.22 10*3/uL (ref <=0.50)
EOSINOPHIL %: 2 %
HCT: 42 % (ref 34.8–46.0)
HGB: 13.3 g/dL (ref 11.5–16.0)
IMMATURE GRANULOCYTE #: <0.1 10*3/uL (ref <0.10)
IMMATURE GRANULOCYTE %: 0 % (ref 0–1)
LYMPHOCYTE #: 2.36 10*3/uL (ref 1.00–4.80)
LYMPHOCYTE %: 26 %
MCH: 28.9 pg (ref 26.0–32.0)
MCHC: 31.7 g/dL (ref 31.0–35.5)
MCV: 91.3 fL (ref 78.0–100.0)
MONOCYTE #: 0.95 10*3/uL (ref 0.20–1.10)
MONOCYTE %: 10 %
MPV: 10.2 fL (ref 8.7–12.5)
NEUTROPHIL #: 5.56 10*3/uL (ref 1.50–7.70)
NEUTROPHIL %: 62 %
PLATELETS: 367 10*3/uL (ref 150–400)
RBC: 4.6 10*6/uL (ref 3.85–5.22)
RDW-CV: 16.4 % — ABNORMAL HIGH (ref 11.5–15.5)
WBC: 9.2 10*3/uL (ref 3.7–11.0)

## 2020-08-24 LAB — VITAMIN D 25 TOTAL: VITAMIN D 25, TOTAL: 46.98 ng/mL (ref 30.00–100.00)

## 2020-08-24 LAB — RENAL FUNCTION PANEL
ALBUMIN: 4.2 g/dL (ref 3.5–5.2)
ANION GAP: 11 mmol/L (ref 10–21)
BUN/CREA RATIO: 22
BUN: 29 mg/dL — ABNORMAL HIGH (ref 6–20)
CALCIUM: 9.4 mg/dL (ref 8.4–10.5)
CHLORIDE: 105 mmol/L (ref 96–107)
CO2 TOTAL: 25 mmol/L (ref 22–29)
CREATININE: 1.29 mg/dL — ABNORMAL HIGH (ref 0.50–0.90)
ESTIMATED GFR: 38 mL/min/{1.73_m2} — ABNORMAL LOW (ref >60)
GLUCOSE: 91 mg/dL (ref 74–106)
PHOSPHORUS: 4.7 mg/dL — ABNORMAL HIGH (ref 2.5–4.5)
POTASSIUM: 4.6 mmol/L (ref 3.5–5.1)
SODIUM: 141 mmol/L (ref 136–145)

## 2020-08-24 LAB — MICROALBUMIN/CREATININE RATIO, URINE, RANDOM
CREATININE RANDOM URINE: 107 mg/dL (ref 30–125)
MICROALBUMIN RANDOM URINE: 14.5 mg/dL — ABNORMAL HIGH (ref 0.0–2.0)
MICROALBUMIN/CREATININE RATIO RANDOM URINE: 135.5 mg/g — ABNORMAL HIGH (ref 0.0–30.0)

## 2020-08-24 LAB — PROTEIN, TOTAL URINE, RANDOM: PROTEIN RANDOM URINE: 44 mg/dL — ABNORMAL HIGH (ref 0–12)

## 2020-08-25 ENCOUNTER — Telehealth (INDEPENDENT_AMBULATORY_CARE_PROVIDER_SITE_OTHER): Payer: Self-pay | Admitting: Internal Medicine

## 2020-08-25 ENCOUNTER — Other Ambulatory Visit (INDEPENDENT_AMBULATORY_CARE_PROVIDER_SITE_OTHER): Payer: Self-pay | Admitting: Internal Medicine

## 2020-08-25 DIAGNOSIS — M810 Age-related osteoporosis without current pathological fracture: Secondary | ICD-10-CM | POA: Insufficient documentation

## 2020-08-25 LAB — PARATHYROID HORMONE (PTH): PTH: 87.5 pg/mL — ABNORMAL HIGH (ref 8.5–77.0)

## 2020-08-25 NOTE — Telephone Encounter (Signed)
Talked with Theresa Greene and went over her scan, she wants me to mail a copy of her scan to her so she can take it with her to her visit with nephrology. Ill be mailing it out today.

## 2020-09-06 ENCOUNTER — Telehealth (INDEPENDENT_AMBULATORY_CARE_PROVIDER_SITE_OTHER): Payer: Self-pay | Admitting: Internal Medicine

## 2020-09-06 NOTE — Telephone Encounter (Signed)
Pt. Called stating she needs a refill on the following.     Lopressor 25 mg    Amiodarone 200 mg     Hydralazine 25 mg     Lipase-protease-amylase (Creon)     She uses the [facility] in [place]

## 2020-09-08 ENCOUNTER — Other Ambulatory Visit (INDEPENDENT_AMBULATORY_CARE_PROVIDER_SITE_OTHER): Payer: Self-pay | Admitting: Internal Medicine

## 2020-09-08 ENCOUNTER — Telehealth (INDEPENDENT_AMBULATORY_CARE_PROVIDER_SITE_OTHER): Payer: Self-pay | Admitting: Internal Medicine

## 2020-09-08 MED ORDER — METOPROLOL TARTRATE 25 MG TABLET
25.0000 mg | ORAL_TABLET | Freq: Every day | ORAL | 3 refills | Status: DC
Start: 2020-09-08 — End: 2021-01-25

## 2020-09-08 MED ORDER — HYDRALAZINE 25 MG TABLET
25.0000 mg | ORAL_TABLET | Freq: Two times a day (BID) | ORAL | 3 refills | Status: DC
Start: 2020-09-08 — End: 2020-11-09

## 2020-09-08 NOTE — Telephone Encounter (Signed)
Patient called in needing a refill on hydrALAZINE (APRESOLINE) 25 mg Oral Tablet    And metoprolol tartrate (LOPRESSOR) 25 mg Oral Tablet

## 2020-09-08 NOTE — Telephone Encounter (Signed)
Sent to  [facility]

## 2020-09-21 ENCOUNTER — Other Ambulatory Visit (INDEPENDENT_AMBULATORY_CARE_PROVIDER_SITE_OTHER): Payer: Self-pay | Admitting: Internal Medicine

## 2020-09-21 ENCOUNTER — Telehealth (INDEPENDENT_AMBULATORY_CARE_PROVIDER_SITE_OTHER): Payer: Self-pay | Admitting: Internal Medicine

## 2020-09-21 MED ORDER — DEXLANSOPRAZOLE 60 MG CAPSULE,BIPHASE DELAYED RELEASE
60.0000 mg | DELAYED_RELEASE_CAPSULE | Freq: Every day | ORAL | 3 refills | Status: AC
Start: 2020-09-21 — End: 2020-12-20

## 2020-09-21 MED ORDER — AMIODARONE 200 MG TABLET
200.0000 mg | ORAL_TABLET | Freq: Every day | ORAL | 3 refills | Status: DC
Start: 2020-09-21 — End: 2021-09-20

## 2020-09-21 NOTE — Telephone Encounter (Signed)
Patient called in needing refills on Dexlansoprazole (DEXILANT) 60 mg Oral Cap, Delayed Rel., Multiphasic    amiodarone (PACERONE) 200 mg Oral Tablet    Sent to [facility]

## 2020-09-25 ENCOUNTER — Other Ambulatory Visit: Payer: Self-pay

## 2020-09-25 ENCOUNTER — Ambulatory Visit: Payer: Medicare Other | Attending: NURSE PRACTITIONER

## 2020-09-25 DIAGNOSIS — E785 Hyperlipidemia, unspecified: Secondary | ICD-10-CM | POA: Insufficient documentation

## 2020-09-25 DIAGNOSIS — I482 Chronic atrial fibrillation, unspecified: Secondary | ICD-10-CM | POA: Insufficient documentation

## 2020-09-25 DIAGNOSIS — R7989 Other specified abnormal findings of blood chemistry: Secondary | ICD-10-CM | POA: Insufficient documentation

## 2020-09-25 LAB — COMPREHENSIVE METABOLIC PANEL, NON-FASTING
ALBUMIN/GLOBULIN RATIO: 1.5 (ref 1.0–2.0)
ALBUMIN: 4.1 g/dL (ref 3.5–5.2)
ALKALINE PHOSPHATASE: 139 U/L — ABNORMAL HIGH (ref 35–104)
ALT (SGPT): 20 U/L (ref 0–33)
ANION GAP: 13 mmol/L (ref 10–21)
AST (SGOT): 32 U/L (ref 0–32)
BILIRUBIN TOTAL: 0.5 mg/dL (ref 0.0–1.0)
BUN/CREA RATIO: 20
BUN: 32 mg/dL — ABNORMAL HIGH (ref 6–20)
CALCIUM: 9.2 mg/dL (ref 8.4–10.5)
CHLORIDE: 102 mmol/L (ref 96–107)
CO2 TOTAL: 22 mmol/L (ref 22–29)
CREATININE: 1.62 mg/dL — ABNORMAL HIGH (ref 0.50–0.90)
ESTIMATED GFR: 29 mL/min/{1.73_m2} — ABNORMAL LOW (ref >60)
GLUCOSE: 119 mg/dL — ABNORMAL HIGH (ref 74–106)
POTASSIUM: 3.6 mmol/L (ref 3.5–5.1)
PROTEIN TOTAL: 6.9 g/dL (ref 6.6–8.7)
SODIUM: 137 mmol/L (ref 136–145)

## 2020-09-25 LAB — LIPID PANEL
CHOLESTEROL: 137 mg/dL (ref 100–170)
HDL CHOL: 76 mg/dL — ABNORMAL HIGH (ref 30–65)
LDL CALC: 31 mg/dL (ref 0–130)
TRIGLYCERIDES: 149 mg/dL (ref 0–200)
VLDL CALC: 30 mg/dL (ref 5–40)

## 2020-09-26 LAB — THYROID STIMULATING HORMONE (SENSITIVE TSH): TSH: 4.54 u[IU]/mL — ABNORMAL HIGH (ref 0.270–4.200)

## 2020-09-26 LAB — THYROXINE, FREE (FREE T4): THYROXINE (T4), FREE: 1.68 ng/dL (ref 0.93–1.70)

## 2020-09-26 LAB — TRIIODOTHYRONINE, TOTAL (TOTAL T3): T3 TOTAL: 63 ng/dL — ABNORMAL LOW (ref 80–200)

## 2020-09-28 ENCOUNTER — Other Ambulatory Visit (INDEPENDENT_AMBULATORY_CARE_PROVIDER_SITE_OTHER): Payer: Self-pay | Admitting: Internal Medicine

## 2020-09-28 ENCOUNTER — Telehealth (INDEPENDENT_AMBULATORY_CARE_PROVIDER_SITE_OTHER): Payer: Self-pay | Admitting: Internal Medicine

## 2020-09-28 MED ORDER — SODIUM BICARBONATE 650 MG TABLET
650.0000 mg | ORAL_TABLET | Freq: Two times a day (BID) | ORAL | 3 refills | Status: DC
Start: 2020-09-28 — End: 2021-12-14

## 2020-09-28 NOTE — Telephone Encounter (Signed)
Spoke with Tasmine and she said that the Dr. Micah Flesher over the lab work with her and they are supposed to give her a call back.

## 2020-10-13 ENCOUNTER — Telehealth (INDEPENDENT_AMBULATORY_CARE_PROVIDER_SITE_OTHER): Payer: Self-pay | Admitting: Internal Medicine

## 2020-10-13 NOTE — Telephone Encounter (Signed)
Theresa Greene called and was wondering about her BP. She said when she's sitting down its 102/54 but when she's up its 71/41, she didn't mention any other symptoms.

## 2020-10-13 NOTE — Telephone Encounter (Signed)
Is she having any symptoms with this? Dizziness, shortness of breath? If so, she may need to cut the nifedipine (ProCardia XL) in half.

## 2020-10-13 NOTE — Telephone Encounter (Signed)
Perfect

## 2020-10-13 NOTE — Telephone Encounter (Signed)
She said she feels dizzy right when she gets up, so I told her to just take half of Procardia XL and  let me know tomorrow how she's doing.

## 2020-10-18 ENCOUNTER — Encounter (INDEPENDENT_AMBULATORY_CARE_PROVIDER_SITE_OTHER): Payer: Self-pay | Admitting: Internal Medicine

## 2020-10-24 ENCOUNTER — Encounter (INDEPENDENT_AMBULATORY_CARE_PROVIDER_SITE_OTHER): Payer: Self-pay | Admitting: Internal Medicine

## 2020-10-24 ENCOUNTER — Telehealth (INDEPENDENT_AMBULATORY_CARE_PROVIDER_SITE_OTHER): Payer: Self-pay | Admitting: Internal Medicine

## 2020-10-24 NOTE — Telephone Encounter (Signed)
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 Pref [facility]

## 2020-10-25 ENCOUNTER — Encounter (INDEPENDENT_AMBULATORY_CARE_PROVIDER_SITE_OTHER): Payer: Self-pay | Admitting: Internal Medicine

## 2020-10-25 ENCOUNTER — Other Ambulatory Visit (INDEPENDENT_AMBULATORY_CARE_PROVIDER_SITE_OTHER): Payer: Self-pay | Admitting: Internal Medicine

## 2020-10-25 MED ORDER — CLOPIDOGREL 75 MG TABLET
75.0000 mg | ORAL_TABLET | Freq: Every day | ORAL | 3 refills | Status: DC
Start: 2020-10-25 — End: 2021-10-20

## 2020-10-25 MED ORDER — ACYCLOVIR 200 MG CAPSULE
200.0000 mg | ORAL_CAPSULE | Freq: Two times a day (BID) | ORAL | 0 refills | Status: AC
Start: 2020-10-25 — End: 2020-10-30

## 2020-10-25 NOTE — Telephone Encounter (Signed)
clopidogreL (PLAVIX) 75 mg Oral Tablet    Request refill on this medication and wanting an rx for a cold sore medicine.

## 2020-10-27 ENCOUNTER — Encounter (INDEPENDENT_AMBULATORY_CARE_PROVIDER_SITE_OTHER): Payer: Self-pay | Admitting: Internal Medicine

## 2020-11-01 ENCOUNTER — Encounter (INDEPENDENT_AMBULATORY_CARE_PROVIDER_SITE_OTHER): Payer: Self-pay | Admitting: Internal Medicine

## 2020-11-02 ENCOUNTER — Encounter (INDEPENDENT_AMBULATORY_CARE_PROVIDER_SITE_OTHER): Payer: Self-pay | Admitting: Internal Medicine

## 2020-11-03 ENCOUNTER — Encounter (INDEPENDENT_AMBULATORY_CARE_PROVIDER_SITE_OTHER): Payer: Self-pay | Admitting: Internal Medicine

## 2020-11-03 ENCOUNTER — Ambulatory Visit: Payer: Medicare Other | Attending: Internal Medicine | Admitting: Internal Medicine

## 2020-11-03 ENCOUNTER — Other Ambulatory Visit (INDEPENDENT_AMBULATORY_CARE_PROVIDER_SITE_OTHER): Payer: Self-pay | Admitting: Internal Medicine

## 2020-11-03 ENCOUNTER — Telehealth (INDEPENDENT_AMBULATORY_CARE_PROVIDER_SITE_OTHER): Payer: Self-pay | Admitting: Internal Medicine

## 2020-11-03 ENCOUNTER — Other Ambulatory Visit: Payer: Self-pay

## 2020-11-03 VITALS — BP 120/60 | HR 75 | Temp 96.7°F | Resp 19 | Ht 66.0 in | Wt 114.0 lb

## 2020-11-03 DIAGNOSIS — D508 Other iron deficiency anemias: Secondary | ICD-10-CM | POA: Insufficient documentation

## 2020-11-03 DIAGNOSIS — I480 Paroxysmal atrial fibrillation: Secondary | ICD-10-CM | POA: Insufficient documentation

## 2020-11-03 DIAGNOSIS — Z79899 Other long term (current) drug therapy: Secondary | ICD-10-CM | POA: Insufficient documentation

## 2020-11-03 DIAGNOSIS — I10 Essential (primary) hypertension: Secondary | ICD-10-CM | POA: Insufficient documentation

## 2020-11-03 DIAGNOSIS — E782 Mixed hyperlipidemia: Secondary | ICD-10-CM | POA: Insufficient documentation

## 2020-11-03 DIAGNOSIS — E559 Vitamin D deficiency, unspecified: Secondary | ICD-10-CM | POA: Insufficient documentation

## 2020-11-03 MED ORDER — EZETIMIBE 10 MG TABLET
10.0000 mg | ORAL_TABLET | Freq: Every day | ORAL | 3 refills | Status: DC
Start: 2020-11-03 — End: 2021-10-30

## 2020-11-03 MED ORDER — ATORVASTATIN 20 MG TABLET
20.0000 mg | ORAL_TABLET | Freq: Every day | ORAL | 3 refills | Status: DC
Start: 2020-11-03 — End: 2021-10-30
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
## 2020-11-03 NOTE — Telephone Encounter (Signed)
Can you call and let her know that I sent in the Lipitor and Zetia. I was not aware that she was on Celebrex -- it must have slipped past me today when I saw her in the office. I had wondered why her kidney function worsened on her last labs. I am not sure who wrote the Celebrex, but she really shouldn't take it with kidney issues. I recommend discontinuing it. She can use Tylenol for pain.

## 2020-11-03 NOTE — Telephone Encounter (Signed)
Patient called in needing a refill on Celebrex, Lipitor, and Zetia.

## 2020-11-03 NOTE — Progress Notes (Signed)
[facility]  PEDIATRICS/INTERNAL MEDICINE, [facility]  [address]  [place] [address]  [phone]  11/03/2020       Name:  Theresa Greene MRN: [id number]   Date:    11/03/2020 Age:  86 y.o.       Reason for Visit:  Chief Complaint   Patient presents with    Follow Up     Diabetes and high cholesterol.  Gets light headed and dizzy when standing up, blood pressure drops         Subjective:   Theresa Greene is a 86 y.o. female who comes in today for follow up. She says that they are not doing very well. She and her husband have had COVID. She says she was sick about 3 weeks ago with her symptoms. They have nearly resolved at this point. She says that her blood pressure is her issue. She says that she is getting dizzy when she gets up and is very light headed. Her daughter is an Charity fundraiser and had visited while they were sick and she noted that she was not doing well with organizing her medications. She had sent a message through MyChart. She says that she had two different medications in one bottle and was halving some of the medications. Her daughter lives away in another state and was requesting a home health aid if Senora would agree to it. She also says she is having some problems with her legs swelling. We discussed at the appointment today that home health could help with her medications and could also monitor her blood pressure at home. She was actually agreeable to this. She has had two sets of laboratories since her last visit with me and everything was relatively stable. I am going to wait to repeat labs prior to her next appointment.      Past Medical History:    Past Medical History:   Diagnosis Date    CAD S/P percutaneous coronary angioplasty 08/06/2014    Formatting of this note might be different from the original. Pt reports 5 years ago    Charcot's joint of foot, left 08/06/2014    Gastroesophageal reflux disease 08/06/2014     History of cardiac pacemaker 08/06/2014    History of CVA (cerebrovascular accident) 08/06/2014    Formatting of this note might be different from the original. 1995, no residual deficits    HTN (hypertension)     Hyperlipidemia 12/25/2019    Neuropathy (CMS HCC)     Paroxysmal atrial fibrillation (CMS HCC) 08/06/2014    PUD (peptic ulcer disease) 08/06/2014    Formatting of this note might be different from the original. C/b GIB Spring 2015    Rheumatoid arthritis (CMS HCC) 08/06/2014    Stage 3 chronic kidney disease (CMS HCC) 12/25/2019    Last Assessment & Plan:  Formatting of this note might be different from the original. Renal function stable, at baseline Stable electrolytes; restart sodium bicarb 650 mg BID  Continue renal protective ARB Discussed CKD risk factor modifications, including tight BP/glucose control, low protein diet, and avoidance of NSAIDs    Type 2 diabetes mellitus (CMS HCC) 08/06/2014    Vitamin D deficiency 12/25/2019      Past Surgical History:    Past Surgical History:   Procedure Laterality Date    CORONARY ARTERY ANGIOPLASTY  2011    HX BACK SURGERY      lower back x 3  HX HIP REPLACEMENT Right     HX NISSEN FUNDOPLICATION  2002    then three others to repair problems    HX OPEN CHOLECYSTECTOMY  1962    with common duxt exploration    HX PACEMAKER INSERTION  2019    KNEE ARTHROPLASTY Right     OPEN REDUCTION INTERNAL FIXATION FRACTURE FEMUR Right 05/07/2020    Performed by Jeanell Sparrow, MD at [facility] OR 5 NORTH    PANCREATIC PSEUDOCYST DRAINAGE  1971    TOTAL ABDOMINAL HYSTERECTOMY       Family History:    Family Medical History:     Problem Relation (Age of Onset)    Ovarian Cancer Mother    Pancreatic Cancer Father           Social History:   Social History     Tobacco Use    Smoking status: Never Smoker    Smokeless tobacco: Never Used   Substance Use Topics    Alcohol use: Never      Social History     Substance and Sexual Activity   Drug Use Never       Social History     Social History Narrative    Lives at home with husband. 2 grown daughters - an Charity fundraiser and a Mining engineer. She did Building control surveyor for 23 years.         Allergies:    Allergies   Allergen Reactions    Iodinated Contrast Media Anaphylaxis    Etodolac NO Steroids unless approved by Attending Physician    Iv Contrast      makese her serverly sick    Codeine  Other Adverse Reaction (Add comment) and Nausea/ Vomiting     Stomach pain  Stomach pain        Home Medications:    Current Outpatient Medications:     amiodarone (PACERONE) 200 mg Oral Tablet, Take 1 Tablet (200 mg total) by mouth Once a day for 90 days, Disp: 90 Tablet, Rfl: 3    atorvastatin (LIPITOR) 20 mg Oral Tablet, Take 20 mg by mouth Once a day, Disp: , Rfl:     calcitrioL (ROCALTROL) 0.25 mcg Oral Capsule, Take 0.25 mcg by mouth Once a day, Disp: , Rfl:     celecoxib (CELEBREX) 200 mg Oral Capsule, Take 200 mg by mouth Twice daily, Disp: , Rfl:     clopidogreL (PLAVIX) 75 mg Oral Tablet, Take 1 Tablet (75 mg total) by mouth Once a day for 90 days, Disp: 90 Tablet, Rfl: 3    Dexlansoprazole (DEXILANT) 60 mg Oral Cap, Delayed Rel., Multiphasic, Take 1 Capsule (60 mg total) by mouth Once a day for 90 days, Disp: 90 Capsule, Rfl: 3    dorzolamide-timoloL (COSOPT) 22.3-6.8 mg/mL Ophthalmic Drops, Instill 2 Drops into right eye Once a day, Disp: , Rfl:     ergocalciferol, vitamin D2, (DRISDOL) 1,250 mcg (50,000 unit) Oral Capsule, Take 50,000 Units by mouth Every 7 days, Disp: , Rfl:     ezetimibe (ZETIA) 10 mg Oral Tablet, Once a day, Disp: , Rfl:     ferrous sulfate (FERATAB) 324 mg (65 mg iron) Oral Tablet, Delayed Release (E.C.), Take 1 Tablet (324 mg total) by mouth Every morning before breakfast Take with 500 mg vit c on empty stomach.  Then nothing to eat for 45 minutes., Disp: 30 Tablet, Rfl: 3    gabapentin (NEURONTIN) 100 mg Oral Capsule, Take 1 Capsule (100 mg total) by mouth Twice  daily for 90 days, Disp: 180  Capsule, Rfl: 0    gemfibroziL (LOPID) 600 mg Oral Tablet, Take 600 mg by mouth Twice a day before meals, Disp: , Rfl:     hydrALAZINE (APRESOLINE) 25 mg Oral Tablet, Take 1 Tablet (25 mg total) by mouth Twice daily for 90 days, Disp: 180 Tablet, Rfl: 3    losartan (COZAAR) 50 mg Oral Tablet, Take 50 mg by mouth Once a day, Disp: , Rfl:     metoprolol tartrate (LOPRESSOR) 25 mg Oral Tablet, Take 1 Tablet (25 mg total) by mouth Once a day, Disp: 90 Tablet, Rfl: 3    NIFEdipine (PROCARDIA XL) 30 mg Oral Tablet Extended Rel 24 hr (2), Take 30 mg by mouth 2X/day, Disp: , Rfl:     pancreatic enzyme replacement (CREON) 12,000-38,000 -60,000 unit Oral Capsule, Delayed Release(E.C.), Take 1 Capsule by mouth Once a day, Disp: , Rfl:     psyllium (METAMUCIL) Packet, Take 1 Packet by mouth Twice daily, Disp: , Rfl:     sodium bicarbonate 650 mg Oral Tablet, Take 1 Tablet (650 mg total) by mouth Twice daily, Disp: 180 Tablet, Rfl: 3    sucralfate (CARAFATE) 1 gram Oral Tablet, Four times a day - before meals and bedtime, Disp: , Rfl:        Review of Systems:   All systems have been reviewed and found to be negative except for as stated above in the history of present illness.     Physical Examination:   Vitals: BP 120/60    Pulse 75    Temp 35.9 C (96.7 F) (Thermal Scan)    Resp 19    Ht 1.676 m (5\' 6" )    Wt 51.7 kg (114 lb)    SpO2 96%    BMI 18.40 kg/m     General:  No apparent distress.  Well nourished and well developed.  Family at bedside.  Interactive.   HEENT:  Atraumatic, normocephalic.   Neck:  Supple. Trachea is midline. No adenopathy. No thyromegaly.  Respiratory: No respiratory distress. Lungs clear to auscultation bilaterally. No wheezes, rales, or rhonchi.  Cardiovascular: Regular rate and rhythm. S1, S2 present. No murmurs, rubs, or gallops. Pulses are +2 and equal in all extremities.  Extremities:  No clubbing or cyanosis.  Edema +trace of the bilateral lower extremities.     Assessment:   (I10)  Primary hypertension  (primary encounter diagnosis)  Plan: Refer to Home Health - EXTERNAL, CBC,         COMPREHENSIVE METABOLIC PANEL, NON-FASTING,         THYROID STIMULATING HORMONE WITH FREE T4         REFLEX, LIPID PANEL, VITAMIN D 25 TOTAL, HGA1C         (HEMOGLOBIN A1C WITH EST AVG GLUCOSE)    (E78.2) Mixed hyperlipidemia  Plan: COMPREHENSIVE METABOLIC PANEL, NON-FASTING,         LIPID PANEL    (I48.0) Paroxysmal atrial fibrillation (CMS HCC)  Plan: CBC, COMPREHENSIVE METABOLIC PANEL,         NON-FASTING, THYROID STIMULATING HORMONE WITH         FREE T4 REFLEX    (E55.9) Vitamin D deficiency  Plan: VITAMIN D 25 TOTAL    (D50.8) Other iron deficiency anemia  Plan: CBC, IRON TRANSFERRIN AND TIBC     Plan:   Tilt test done in the office today actually showed that her blood pressure dropped from 120 to 100 systolic when she went  from sitting to standing. This is likely the source of her issues and puts her at increased risk of falls. I am going to discontinue the HCTZ 12.5mg  oral daily. She can continue on hydralazine 25 mg oral twice daily, Lopressor 25 mg oral daily, losartan 50 mg oral daily, and nifedipine 30 mg oral daily. I will order Home Health with [facility] to come by and monitor her medications and check blood pressures while we adjust the medication.     Check CMP and lipid panel prior to next appointment. Continue on Lipitor 20 mg oral daily, Zetia 10 mg oral daily, and Lopid 600 mg oral twice daily.     Currently NSR. Continue on amiodarone 200 mg oral daily, Lopressor 25 mg oral daily, and nifedipine 30 mg oral daily. Not on anticoagulation secondary to fall risk.     Check level at next visit.     Her iron deficiency is actually likely due to CKD. Check panel prior to next labs. Continue on irone replacement.     Patient was counseled on diagnosis, follow-up, and side effect of medications.  Reasons for follow up in clinic or emergency department were discussed.    Orders:    Orders Placed This  Encounter    CBC    COMPREHENSIVE METABOLIC PANEL, NON-FASTING    THYROID STIMULATING HORMONE WITH FREE T4 REFLEX    LIPID PANEL    VITAMIN D 25 TOTAL    HGA1C (HEMOGLOBIN A1C WITH EST AVG GLUCOSE)    IRON TRANSFERRIN AND TIBC    Refer to Home Health - EXTERNAL      Referrals:   Orders Placed This Encounter   Procedures    Refer to Home Health - EXTERNAL      Discharge Medication List:       Current Discharge Medication List          Accurate as of November 03, 2020  2:48 PM. If you have any questions, ask your nurse or doctor.            CONTINUE these medications - NO CHANGES were made during your visit.      Details   amiodarone 200 mg Tablet  Commonly known as: PACERONE   200 mg, Oral, DAILY  Qty: 90 Tablet  Refills: 3     atorvastatin 20 mg Tablet  Commonly known as: LIPITOR   20 mg, Oral, DAILY  Refills: 0     calcitrioL 0.25 mcg Capsule  Commonly known as: ROCALTROL   0.25 mcg, Oral, DAILY  Refills: 0     celecoxib 200 mg Capsule  Commonly known as: CELEBREX   200 mg, Oral, 2 TIMES DAILY  Refills: 0     clopidogreL 75 mg Tablet  Commonly known as: PLAVIX   75 mg, Oral, DAILY  Qty: 90 Tablet  Refills: 3     Dexlansoprazole 60 mg Cap, Delayed Rel., Multiphasic  Commonly known as: DEXILANT   60 mg, Oral, DAILY  Qty: 90 Capsule  Refills: 3     dorzolamide-timoloL 22.3-6.8 mg/mL Drops  Commonly known as: COSOPT   2 Drops, Right Eye, DAILY  Refills: 0     ergocalciferol (vitamin D2) 1,250 mcg (50,000 unit) Capsule  Commonly known as: DRISDOL   50,000 Units, Oral, EVERY 7 DAYS  Refills: 0     ezetimibe 10 mg Tablet  Commonly known as: ZETIA   DAILY  Refills: 0     ferrous sulfate 324 mg (65 mg iron) Tablet, Delayed  Release (E.C.)  Commonly known as: FERATAB   324 mg, Oral, EVERY MORNING BEFORE BREAKFAST, Take with 500 mg vit c on empty stomach.  Then nothing to eat for 45 minutes.  Qty: 30 Tablet  Refills: 3     gabapentin 100 mg Capsule  Commonly known as: NEURONTIN   100 mg, Oral, 2 TIMES DAILY  Qty: 180  Capsule  Refills: 0     gemfibroziL 600 mg Tablet  Commonly known as: LOPID   600 mg, Oral, 2 TIMES DAILY BEFORE MEALS  Refills: 0     hydrALAZINE 25 mg Tablet  Commonly known as: APRESOLINE   25 mg, Oral, 2 TIMES DAILY  Qty: 180 Tablet  Refills: 3     losartan 50 mg Tablet  Commonly known as: COZAAR   50 mg, Oral, DAILY  Refills: 0     metoprolol tartrate 25 mg Tablet  Commonly known as: LOPRESSOR   25 mg, Oral, DAILY  Qty: 90 Tablet  Refills: 3     NIFEdipine 30 mg Tablet Extended Rel 24 hr  Commonly known as: PROCARDIA XL   30 mg, Oral, 2 TIMES DAILY  Refills: 0     pancreatic enzyme replacement 12,000-38,000 -60,000 unit Capsule, Delayed Release(E.C.)  Commonly known as: CREON   1 Capsule, Oral, DAILY  Refills: 0     psyllium Packet  Commonly known as: METAMUCIL   1 Packet, Oral, 2 TIMES DAILY  Refills: 0     sodium bicarbonate 650 mg Tablet   650 mg, Oral, 2 TIMES DAILY  Qty: 180 Tablet  Refills: 3     sucralfate 1 gram Tablet  Commonly known as: CARAFATE   4 TIMES DAILY - BEFORE MEALS AND NIGHTLY  Refills: 0        STOP taking these medications.    fluorouraciL 5 % Cream  Commonly known as: EFUDEX  Stopped by: Kayleeann Huxford Hill-Reinert, DO     hydroCHLOROthiazide 12.5 mg Tablet  Commonly known as: HYDRODIURIL  Stopped by: [employer], DO               Follow up: Return in about 3 months (around 02/03/2021) for high blood pressure. Patient was advised to follow up sooner if issues arise.     Loyda Costin Hill-Reinert, DO    This note was partially created using M*Modal fluency direct system (voice recognition software ) and is inherently subject to errors including those of syntax and sound- alike" substitutions which may escape proofreading.  In such instances, original meaning may be extrapolated by contextual derivation.

## 2020-11-03 NOTE — Telephone Encounter (Signed)
Called and notified Theresa Greene on the 2 medications you called in and that you did not recommend taking the Celebrex. She said she would stop taking it. She had no further questions at this time.

## 2020-11-09 ENCOUNTER — Other Ambulatory Visit (INDEPENDENT_AMBULATORY_CARE_PROVIDER_SITE_OTHER): Payer: Self-pay | Admitting: Internal Medicine

## 2020-11-09 ENCOUNTER — Telehealth (INDEPENDENT_AMBULATORY_CARE_PROVIDER_SITE_OTHER): Payer: Self-pay | Admitting: Internal Medicine

## 2020-11-09 MED ORDER — HYDRALAZINE 25 MG TABLET
25.0000 mg | ORAL_TABLET | Freq: Two times a day (BID) | ORAL | 3 refills | Status: DC
Start: 2020-11-09 — End: 2020-12-29

## 2020-11-09 NOTE — Telephone Encounter (Signed)
Sent to pharmacy 

## 2020-11-09 NOTE — Telephone Encounter (Signed)
Patient called in needing a refill on hydrALAZINE (APRESOLINE) 25 mg Oral Tablet

## 2020-11-23 ENCOUNTER — Other Ambulatory Visit (INDEPENDENT_AMBULATORY_CARE_PROVIDER_SITE_OTHER): Payer: Self-pay | Admitting: Internal Medicine

## 2020-11-23 ENCOUNTER — Telehealth (INDEPENDENT_AMBULATORY_CARE_PROVIDER_SITE_OTHER): Payer: Self-pay | Admitting: Internal Medicine

## 2020-11-23 MED ORDER — NIFEDIPINE ER 30 MG TABLET,EXTENDED RELEASE 24 HR
30.0000 mg | EXTENDED_RELEASE_TABLET | Freq: Two times a day (BID) | ORAL | 3 refills | Status: DC
Start: 2020-11-23 — End: 2021-01-31

## 2020-11-23 NOTE — Telephone Encounter (Signed)
NIFEdipine (PROCARDIA XL) 30 mg Oral Tablet Extended Rel 24 hr (2)  Patient requests refill on this medication to [facility] in [place].

## 2020-12-08 ENCOUNTER — Telehealth (INDEPENDENT_AMBULATORY_CARE_PROVIDER_SITE_OTHER): Payer: Self-pay | Admitting: Internal Medicine

## 2020-12-08 ENCOUNTER — Other Ambulatory Visit (INDEPENDENT_AMBULATORY_CARE_PROVIDER_SITE_OTHER): Payer: Self-pay | Admitting: Internal Medicine

## 2020-12-08 MED ORDER — CALCITRIOL 0.25 MCG CAPSULE
0.2500 ug | ORAL_CAPSULE | Freq: Every day | ORAL | 3 refills | Status: DC
Start: 2020-12-08 — End: 2021-12-06

## 2020-12-08 MED ORDER — ERGOCALCIFEROL (VITAMIN D2) 1,250 MCG (50,000 UNIT) CAPSULE
50000.0000 [IU] | ORAL_CAPSULE | ORAL | 3 refills | Status: AC
Start: 2020-12-08 — End: 2021-03-17

## 2020-12-08 MED ORDER — GEMFIBROZIL 600 MG TABLET
600.0000 mg | ORAL_TABLET | Freq: Two times a day (BID) | ORAL | 3 refills | Status: DC
Start: 2020-12-08 — End: 2021-10-09

## 2020-12-08 NOTE — Telephone Encounter (Signed)
Patient called in needing refills on gemfibroziL (LOPID) 600 mg Oral Tablet    And ergocalciferol, vitamin D2, (DRISDOL) 1,250 mcg (50,000 unit) Oral Capsule    And calcitrioL (ROCALTROL) 0.25 mcg Oral Capsule

## 2020-12-27 ENCOUNTER — Telehealth (INDEPENDENT_AMBULATORY_CARE_PROVIDER_SITE_OTHER): Payer: Self-pay | Admitting: Internal Medicine

## 2020-12-27 NOTE — Telephone Encounter (Signed)
Patients daughter called stating that when Theresa Greene takes her Procardia her BP is dropping as low as 110/50 and she is feeling dizzy and they are wanting to know if she can take a lower dose.

## 2020-12-28 ENCOUNTER — Encounter (INDEPENDENT_AMBULATORY_CARE_PROVIDER_SITE_OTHER): Payer: Self-pay | Admitting: Internal Medicine

## 2020-12-28 ENCOUNTER — Other Ambulatory Visit (INDEPENDENT_AMBULATORY_CARE_PROVIDER_SITE_OTHER): Payer: Self-pay | Admitting: Internal Medicine

## 2020-12-28 MED ORDER — LOSARTAN 50 MG TABLET
50.0000 mg | ORAL_TABLET | Freq: Every day | ORAL | 3 refills | Status: DC
Start: 2020-12-28 — End: 2021-02-17

## 2020-12-28 NOTE — Telephone Encounter (Signed)
Needs a refill on the follow medication. She uses the [facility] in [place].

## 2020-12-29 ENCOUNTER — Other Ambulatory Visit (INDEPENDENT_AMBULATORY_CARE_PROVIDER_SITE_OTHER): Payer: Self-pay | Admitting: Internal Medicine

## 2020-12-29 MED ORDER — HYDRALAZINE 10 MG TABLET
10.0000 mg | ORAL_TABLET | Freq: Two times a day (BID) | ORAL | 0 refills | Status: DC
Start: 2020-12-29 — End: 2021-01-31

## 2021-01-01 ENCOUNTER — Other Ambulatory Visit: Payer: Self-pay

## 2021-01-01 ENCOUNTER — Ambulatory Visit (HOSPITAL_COMMUNITY): Payer: Medicare Other

## 2021-01-01 ENCOUNTER — Other Ambulatory Visit (HOSPITAL_COMMUNITY): Payer: Self-pay | Admitting: Clinical Cardiac Electrophysiology

## 2021-01-01 ENCOUNTER — Ambulatory Visit
Admission: RE | Admit: 2021-01-01 | Discharge: 2021-01-01 | Disposition: A | Discharge status: Home / Self Care | Payer: Medicare Other | Source: Ambulatory Visit | Attending: Clinical Cardiac Electrophysiology | Admitting: Clinical Cardiac Electrophysiology

## 2021-01-01 DIAGNOSIS — N183 Chronic kidney disease, stage 3 unspecified (CMS HCC): Secondary | ICD-10-CM | POA: Insufficient documentation

## 2021-01-01 DIAGNOSIS — I10 Essential (primary) hypertension: Secondary | ICD-10-CM | POA: Insufficient documentation

## 2021-01-01 DIAGNOSIS — I480 Paroxysmal atrial fibrillation: Secondary | ICD-10-CM | POA: Insufficient documentation

## 2021-01-01 DIAGNOSIS — E782 Mixed hyperlipidemia: Secondary | ICD-10-CM | POA: Insufficient documentation

## 2021-01-01 DIAGNOSIS — I4891 Unspecified atrial fibrillation: Secondary | ICD-10-CM

## 2021-01-01 DIAGNOSIS — I129 Hypertensive chronic kidney disease with stage 1 through stage 4 chronic kidney disease, or unspecified chronic kidney disease: Secondary | ICD-10-CM | POA: Insufficient documentation

## 2021-01-01 DIAGNOSIS — D508 Other iron deficiency anemias: Secondary | ICD-10-CM | POA: Insufficient documentation

## 2021-01-01 DIAGNOSIS — E559 Vitamin D deficiency, unspecified: Secondary | ICD-10-CM | POA: Insufficient documentation

## 2021-01-01 LAB — RENAL FUNCTION PANEL
ALBUMIN: 4.6 g/dL (ref 3.5–5.2)
ANION GAP: 13 mmol/L (ref 10–21)
BUN/CREA RATIO: 22
BUN: 30 mg/dL — ABNORMAL HIGH (ref 6–20)
CALCIUM: 10.3 mg/dL (ref 8.4–10.5)
CHLORIDE: 104 mmol/L (ref 96–107)
CO2 TOTAL: 23 mmol/L (ref 22–29)
CREATININE: 1.36 mg/dL — ABNORMAL HIGH (ref 0.50–0.90)
ESTIMATED GFR: 36 mL/min/{1.73_m2} — ABNORMAL LOW (ref >60)
GLUCOSE: 112 mg/dL — ABNORMAL HIGH (ref 74–106)
PHOSPHORUS: 4.2 mg/dL (ref 2.5–4.5)
POTASSIUM: 4.4 mmol/L (ref 3.5–5.1)
SODIUM: 140 mmol/L (ref 136–145)

## 2021-01-01 LAB — COMPREHENSIVE METABOLIC PANEL, NON-FASTING
ALBUMIN/GLOBULIN RATIO: 1.8 (ref 1.0–2.0)
ALBUMIN: 4.6 g/dL (ref 3.5–5.2)
ALKALINE PHOSPHATASE: 133 U/L — ABNORMAL HIGH (ref 35–104)
ALT (SGPT): 10 U/L (ref 0–33)
ANION GAP: 13 mmol/L (ref 10–21)
AST (SGOT): 21 U/L (ref 0–32)
BILIRUBIN TOTAL: 0.5 mg/dL (ref 0.0–1.0)
BUN/CREA RATIO: 22
BUN: 30 mg/dL — ABNORMAL HIGH (ref 6–20)
CALCIUM: 10.3 mg/dL (ref 8.4–10.5)
CHLORIDE: 104 mmol/L (ref 96–107)
CO2 TOTAL: 23 mmol/L (ref 22–29)
CREATININE: 1.36 mg/dL — ABNORMAL HIGH (ref 0.50–0.90)
ESTIMATED GFR: 36 mL/min/{1.73_m2} — ABNORMAL LOW (ref >60)
GLUCOSE: 112 mg/dL — ABNORMAL HIGH (ref 74–106)
POTASSIUM: 4.4 mmol/L (ref 3.5–5.1)
PROTEIN TOTAL: 7.2 g/dL (ref 6.6–8.7)
SODIUM: 140 mmol/L (ref 136–145)

## 2021-01-01 LAB — THYROID STIMULATING HORMONE (SENSITIVE TSH): TSH: 1.32 u[IU]/mL (ref 0.270–4.200)

## 2021-01-02 LAB — PARATHYROID HORMONE (PTH): PTH: 88 pg/mL — ABNORMAL HIGH (ref 8.5–77.0)

## 2021-01-11 ENCOUNTER — Telehealth (INDEPENDENT_AMBULATORY_CARE_PROVIDER_SITE_OTHER): Payer: Self-pay | Admitting: Internal Medicine

## 2021-01-11 NOTE — Telephone Encounter (Signed)
Daughter [place] of [place] called saying Theresa Greene is having trouble with her BP and it dropping when she stands. Daughter thinks she needs her dose in procardia decreased. She also mentioned that Fleur said she's been going to the bathroom almost every hour at night. She said she wants to address these things with you, that she's worried she's going to fall during the night.

## 2021-01-11 NOTE — Telephone Encounter (Signed)
Didn't we just discuss the Procardia with her daughter last week? It doesn't come in a lower dose. If she is having issues, she needs to come in and be seen or go to the walk-in clinic.

## 2021-01-11 NOTE — Telephone Encounter (Signed)
Daughter wants me to get her put on the schedule. She also wants to know if we can refer Aishi to home health as well to help with her medication administration?

## 2021-01-11 NOTE — Telephone Encounter (Signed)
Will probably have to be next week before we can see her. That is why I mentioned the walk-in clinic. We can do the referral at her visit. She will need a face-to-face encounter for it.

## 2021-01-12 ENCOUNTER — Encounter (INDEPENDENT_AMBULATORY_CARE_PROVIDER_SITE_OTHER): Payer: Self-pay | Admitting: Internal Medicine

## 2021-01-23 ENCOUNTER — Telehealth (INDEPENDENT_AMBULATORY_CARE_PROVIDER_SITE_OTHER): Payer: Self-pay | Admitting: Internal Medicine

## 2021-01-23 ENCOUNTER — Other Ambulatory Visit (INDEPENDENT_AMBULATORY_CARE_PROVIDER_SITE_OTHER): Payer: Self-pay | Admitting: Internal Medicine

## 2021-01-23 NOTE — Telephone Encounter (Signed)
1 she is follow up 6/24 she has no apt.   2 per the notes holly DC the gabapentin and   3 the metoprolol was ordered for a year in Jan 2022

## 2021-01-23 NOTE — Telephone Encounter (Signed)
Patient called in needing a refill on Creon and gabapentin.

## 2021-01-25 ENCOUNTER — Other Ambulatory Visit (INDEPENDENT_AMBULATORY_CARE_PROVIDER_SITE_OTHER): Payer: Self-pay | Admitting: Internal Medicine

## 2021-01-25 MED ORDER — LIPASE-PROTEASE-AMYLASE 12,000-38,000-60,000 UNIT CAPSULE,DELAYED REL
1.0000 | DELAYED_RELEASE_CAPSULE | Freq: Every day | ORAL | 3 refills | Status: DC
Start: 2021-01-25 — End: 2021-01-31

## 2021-01-25 MED ORDER — METOPROLOL TARTRATE 25 MG TABLET
25.0000 mg | ORAL_TABLET | Freq: Every day | ORAL | 3 refills | Status: DC
Start: 2021-01-25 — End: 2021-01-31

## 2021-01-25 MED ORDER — GABAPENTIN 100 MG CAPSULE
100.0000 mg | ORAL_CAPSULE | Freq: Two times a day (BID) | ORAL | 0 refills | Status: DC
Start: 2021-01-25 — End: 2021-04-25

## 2021-01-31 ENCOUNTER — Other Ambulatory Visit: Payer: Self-pay

## 2021-01-31 ENCOUNTER — Ambulatory Visit: Payer: Medicare Other | Attending: Internal Medicine | Admitting: Internal Medicine

## 2021-01-31 ENCOUNTER — Encounter (INDEPENDENT_AMBULATORY_CARE_PROVIDER_SITE_OTHER): Payer: Self-pay | Admitting: Internal Medicine

## 2021-01-31 VITALS — BP 91/58 | HR 72 | Temp 97.3°F | Resp 17 | Wt 107.0 lb

## 2021-01-31 DIAGNOSIS — D508 Other iron deficiency anemias: Secondary | ICD-10-CM | POA: Insufficient documentation

## 2021-01-31 DIAGNOSIS — Z79899 Other long term (current) drug therapy: Secondary | ICD-10-CM | POA: Insufficient documentation

## 2021-01-31 DIAGNOSIS — I951 Orthostatic hypotension: Secondary | ICD-10-CM

## 2021-01-31 NOTE — Nursing Note (Signed)
01/31/21 1044   Depression Screen   Little interest or pleasure in doing things. 0   Feeling down, depressed, or hopeless 0   PHQ 2 Total 0

## 2021-01-31 NOTE — Nursing Note (Signed)
01/31/21 1044   Fall Risk Assessment   Do you feel unsteady when standing or walking? Yes   Do you worry about falling? Yes   Have you fallen in the past year? Yes   How many times have you fallen? 2 or more times   Were you ever injured from falling? Yes  (broke her femur bone)

## 2021-01-31 NOTE — Progress Notes (Unsigned)
[facility]  PEDIATRICS/INTERNAL MEDICINE, [facility]  [address]  [place] [address]  [phone]  02/01/2021       Name:  Theresa Greene MRN: [id number]   Date:    01/31/2021 Age:  86 y.o.       Reason for Visit:  Chief Complaint   Patient presents with   . Follow Up     Wants to address BP issues. Go over medications as well. Sitting BP-91/58 HR- 72. Standing-78/52 Hr- 70  Laying 135/76 Hr-82         Subjective:   Theresa Greene is a 86 y.o. female who comes in today for an acute visit. Her daughter has contacted me several times since her last visit due to issues with dysuria, dizziness, and low blood pressure. She saw Aundra Millet at [facility] on 01/04/2021 and they addressed her hypotension. She was advised to discontinue the hydralazine.She has also been working with Dr. Darron Doom on this. She says that he cut her Procardia down to 30 mg daily a few months ago. However, with these changes she is still having dizziness. She is very frustrated because she is too dizzy to go to church because she is afraid she will fall again. She and her husband say that they have not heard from [facility]. They had agreed to this previously for blood pressure and medication management. She denies any symptoms of UTI today.    Past Medical History:    Past Medical History:   Diagnosis Date   . CAD S/P percutaneous coronary angioplasty 08/06/2014    Formatting of this note might be different from the original. Pt reports 5 years ago   . Charcot's joint of foot, left 08/06/2014   . Gastroesophageal reflux disease 08/06/2014   . History of cardiac pacemaker 08/06/2014   . History of CVA (cerebrovascular accident) 08/06/2014    Formatting of this note might be different from the original. 1995, no residual deficits   . HTN (hypertension)    . Hyperlipidemia 12/25/2019   . Neuropathy (CMS HCC)    . Paroxysmal atrial fibrillation (CMS HCC) 08/06/2014   . PUD  (peptic ulcer disease) 08/06/2014    Formatting of this note might be different from the original. C/b GIB Spring 2015   . Rheumatoid arthritis (CMS HCC) 08/06/2014   . Stage 3 chronic kidney disease (CMS HCC) 12/25/2019    Last Assessment & Plan:  Formatting of this note might be different from the original. Renal function stable, at baseline Stable electrolytes; restart sodium bicarb 650 mg BID  Continue renal protective ARB Discussed CKD risk factor modifications, including tight BP/glucose control, low protein diet, and avoidance of NSAIDs   . Type 2 diabetes mellitus (CMS HCC) 08/06/2014   . Vitamin D deficiency 12/25/2019      Past Surgical History:    Past Surgical History:   Procedure Laterality Date   . CORONARY ARTERY ANGIOPLASTY  2011   . HX BACK SURGERY      lower back x 3   . HX HIP REPLACEMENT Right    . HX NISSEN FUNDOPLICATION  2002    then three others to repair problems   . HX OPEN CHOLECYSTECTOMY  1962    with common duxt exploration   . HX PACEMAKER INSERTION  2019   . KNEE ARTHROPLASTY Right    . OPEN REDUCTION INTERNAL FIXATION FRACTURE FEMUR Right 05/07/2020    Performed by  Jeanell Sparrow, MD at [facility]   . PANCREATIC PSEUDOCYST DRAINAGE  1971   . TOTAL ABDOMINAL HYSTERECTOMY       Family History:    Family Medical History:     Problem Relation (Age of Onset)    Ovarian Cancer Mother    Pancreatic Cancer Father           Social History:   Social History     Tobacco Use   . Smoking status: Never Smoker   . Smokeless tobacco: Never Used   Substance Use Topics   . Alcohol use: Never      Social History     Substance and Sexual Activity   Drug Use Never      Social History     Social History Narrative    Lives at home with husband. 2 grown daughters - an Charity fundraiser and a Mining engineer. She did Building control surveyor for 23 years.         Allergies:    Allergies   Allergen Reactions   . Iodinated Contrast Media Anaphylaxis   . Etodolac NO Steroids unless approved by Attending Physician   . Iv  Contrast      makese her serverly sick   . Codeine  Other Adverse Reaction (Add comment) and Nausea/ Vomiting     Stomach pain  Stomach pain        Home Medications:    Current Outpatient Medications:   .  amiodarone (PACERONE) 200 mg Oral Tablet, Take 1 Tablet (200 mg total) by mouth Once a day for 90 days, Disp: 90 Tablet, Rfl: 3  .  atorvastatin (LIPITOR) 20 mg Oral Tablet, Take 1 Tablet (20 mg total) by mouth Once a day for 90 days, Disp: 90 Tablet, Rfl: 3  .  calcitrioL (ROCALTROL) 0.25 mcg Oral Capsule, Take 1 Capsule (0.25 mcg total) by mouth Once a day for 90 days, Disp: 90 Capsule, Rfl: 3  .  clopidogreL (PLAVIX) 75 mg Oral Tablet, Take 1 Tablet (75 mg total) by mouth Once a day for 90 days, Disp: 90 Tablet, Rfl: 3  .  CREON 36,000-114,000- 180,000 unit Oral Capsule, Delayed Release(E.C.), , Disp: , Rfl:   .  DEXILANT 60 mg Oral Cap, Delayed Rel., Multiphasic, , Disp: , Rfl:   .  ergocalciferol, vitamin D2, (DRISDOL) 1,250 mcg (50,000 unit) Oral Capsule, Take 1 Capsule (50,000 Units total) by mouth Every 7 days for 90 days, Disp: 12 Capsule, Rfl: 3  .  ezetimibe (ZETIA) 10 mg Oral Tablet, Take 1 Tablet (10 mg total) by mouth Once a day for 90 days, Disp: 90 Tablet, Rfl: 3  .  gabapentin (NEURONTIN) 100 mg Oral Capsule, Take 1 Capsule (100 mg total) by mouth Twice daily for 90 days, Disp: 180 Capsule, Rfl: 0  .  gemfibroziL (LOPID) 600 mg Oral Tablet, Take 1 Tablet (600 mg total) by mouth Twice a day before meals for 90 days, Disp: 180 Tablet, Rfl: 3  .  losartan (COZAAR) 50 mg Oral Tablet, Take 1 Tablet (50 mg total) by mouth Once a day for 90 days, Disp: 90 Tablet, Rfl: 3  .  NIFEdipine (PROCARDIA XL) 30 mg Oral Tablet Extended Rel 24 hr, Take 30 mg by mouth Once a day, Disp: , Rfl:   .  sodium bicarbonate 650 mg Oral Tablet, Take 1 Tablet (650 mg total) by mouth Twice daily, Disp: 180 Tablet, Rfl: 3       Review of Systems:  All systems have been reviewed and found to be negative except for as stated  above in the history of present illness.     Physical Examination:   Vitals: BP (!) 91/58   Pulse 72   Temp 36.3 C (97.3 F) (Thermal Scan)   Resp 17   Wt 48.5 kg (107 lb)   SpO2 95%   BMI 17.27 kg/m     General:  No apparent distress.  Well nourished and well developed.  Family at bedside.  Interactive.   HEENT:  Atraumatic, normocephalic.  Mucous membranes moist. Tongue protrudes midline. No tonsillar erythema or exudates.  Neck:  Supple. Trachea is midline. No JVD. No adenopathy. No thyromegaly.  Respiratory: No respiratory distress. Lungs clear to auscultation bilaterally. No wheezes, rales, or rhonchi.  Cardiovascular: Regular rate and rhythm. S1, S2 present. No murmurs, rubs, or gallops. Pulses are +2 and equal in all extremities.  Extremities:  No clubbing, cyanosis or edema.     Assessment:   (I95.1) Orthostatic hypotension  (primary encounter diagnosis)  Plan: Refer to Home Health - EXTERNAL    (D50.8) Other iron deficiency anemia  Plan: FERRITIN     Plan:   Her tilt test is grossly positive here in the office with her blood pressure dropping from 135/76 to 78/52 and minimal response on her heart rate with it going from 82 to 70. I think this signifies excessive nodal blocking and is not allowing her heart rate to respond to the drop in blood pressure. For now, I am going to stop her beta blocker completely. We may have to cut the losartan to 25 mg as well, but I will address it at her next visit. If those two changes do not resolve the issue, Dr. Darron Doom may have to change the Procardia to a different agent or discontinue it altogether.     Check all routine laboratories prior to next visit.     Patient was counseled on diagnosis, follow-up, and side effect of medications.  Reasons for follow up in clinic or emergency department were discussed.    Orders:    Orders Placed This Encounter   . FERRITIN   . Refer to Home Health - EXTERNAL      Referrals:   Orders Placed This Encounter   Procedures   .  Refer to Home Health - EXTERNAL      Discharge Medication List:       Current Discharge Medication List          Accurate as of January 31, 2021 11:59 PM. If you have any questions, ask your nurse or doctor.            CONTINUE these medications which have CHANGED during your visit.      Details   Creon 36,000-114,000- 180,000 unit Capsule, Delayed Release(E.C.)  Generic drug: lipase-protease-amylase  What changed: Another medication with the same name was removed. Continue taking this medication, and follow the directions you see here.  Changed by: Jeanice Lim Hill-Reinert, DO   No dose, route, or frequency recorded.  Refills: 0     NIFEdipine 30 mg Tablet Extended Rel 24 hr  Commonly known as: PROCARDIA XL  What changed: Another medication with the same name was removed. Continue taking this medication, and follow the directions you see here.  Changed by: Jeanice Lim Hill-Reinert, DO   30 mg, Oral, DAILY  Refills: 0        CONTINUE these medications - NO CHANGES were made during your visit.  Details   amiodarone 200 mg Tablet  Commonly known as: PACERONE   200 mg, Oral, DAILY  Qty: 90 Tablet  Refills: 3     atorvastatin 20 mg Tablet  Commonly known as: LIPITOR   20 mg, Oral, DAILY  Qty: 90 Tablet  Refills: 3     calcitrioL 0.25 mcg Capsule  Commonly known as: ROCALTROL   0.25 mcg, Oral, DAILY  Qty: 90 Capsule  Refills: 3     clopidogreL 75 mg Tablet  Commonly known as: PLAVIX   75 mg, Oral, DAILY  Qty: 90 Tablet  Refills: 3     Dexilant 60 mg Cap, Delayed Rel., Multiphasic  Generic drug: Dexlansoprazole   No dose, route, or frequency recorded.  Refills: 0     ergocalciferol (vitamin D2) 1,250 mcg (50,000 unit) Capsule  Commonly known as: DRISDOL   50,000 Units, Oral, EVERY 7 DAYS  Qty: 12 Capsule  Refills: 3     ezetimibe 10 mg Tablet  Commonly known as: ZETIA   10 mg, Oral, DAILY  Qty: 90 Tablet  Refills: 3     gabapentin 100 mg Capsule  Commonly known as: NEURONTIN   100 mg, Oral, 2 TIMES DAILY  Qty: 180 Capsule  Refills:  0     gemfibroziL 600 mg Tablet  Commonly known as: LOPID   600 mg, Oral, 2 TIMES DAILY BEFORE MEALS  Qty: 180 Tablet  Refills: 3     losartan 50 mg Tablet  Commonly known as: COZAAR   50 mg, Oral, DAILY  Qty: 90 Tablet  Refills: 3     sodium bicarbonate 650 mg Tablet   650 mg, Oral, 2 TIMES DAILY  Qty: 180 Tablet  Refills: 3        STOP taking these medications.    dorzolamide-timoloL 22.3-6.8 mg/mL Drops  Commonly known as: COSOPT  Stopped by: [employer], DO     ferrous sulfate 324 mg (65 mg iron) Tablet, Delayed Release (E.C.)  Commonly known as: FERATAB  Stopped by: Mailyn Steichen Hill-Reinert, DO     hydrALAZINE 10 mg Tablet  Commonly known as: APRESOLINE  Stopped by: [employer], DO     metoprolol tartrate 25 mg Tablet  Commonly known as: LOPRESSOR  Stopped by: [employer], DO     psyllium Packet  Commonly known as: METAMUCIL  Stopped by: [employer], DO     sucralfate 1 gram Tablet  Commonly known as: CARAFATE  Stopped by: [employer], DO               Follow up: Return in about 3 weeks (around 02/21/2021) for Orthostatic Hypotension. Patient was advised to follow up sooner if issues arise.     Ayeshia Coppin Hill-Reinert, DO    This note was partially created using M*Modal fluency direct system (voice recognition software ) and is inherently subject to errors including those of syntax and "sound- alike" substitutions which may escape proofreading.  In such instances, original meaning may be extrapolated by contextual derivation.

## 2021-01-31 NOTE — Nursing Note (Signed)
Travel Screening     Question   Response    In the last 10 days, have you been in contact with someone who was confirmed or suspected to have Coronavirus/COVID-19?  No / Unsure    Have you had a COVID-19 viral test in the last 10 days?  No    Do you have any of the following new or worsening symptoms?  None of these    Have you traveled internationally or domestically in the last month?  No      Travel History   Travel since 12/31/20    No documented travel since 12/31/20         PHQ Questionnaire  Little interest or pleasure in doing things.: Not at all  Feeling down, depressed, or hopeless: Not at all  PHQ 2 Total: 0        Fall Risk Assessment  Do you feel unsteady when standing or walking?: Yes  Do you worry about falling?: Yes  Have you fallen in the past year?: Yes  How many times have you fallen?: 2 or more times  Were you ever injured from falling?: Yes (broke her femur bone)    Body mass index is 17.27 kg/m.

## 2021-02-01 NOTE — Addendum Note (Signed)
Addended by: Clydia Llano on: 02/01/2021 01:19 PM     Modules accepted: Orders

## 2021-02-03 ENCOUNTER — Encounter (INDEPENDENT_AMBULATORY_CARE_PROVIDER_SITE_OTHER): Payer: Self-pay | Admitting: Internal Medicine

## 2021-02-14 ENCOUNTER — Telehealth (INDEPENDENT_AMBULATORY_CARE_PROVIDER_SITE_OTHER): Payer: Self-pay | Admitting: Internal Medicine

## 2021-02-14 NOTE — Telephone Encounter (Signed)
Pts daughter called and was wondering about the orders for Home Health and was requesting a call back with some questions about them. States they haven't heard from anyone about home health.    Thanks!    Anastasiya Gowin  02/14/2021, 13:25

## 2021-02-16 ENCOUNTER — Telehealth (INDEPENDENT_AMBULATORY_CARE_PROVIDER_SITE_OTHER): Payer: Self-pay | Admitting: Internal Medicine

## 2021-02-16 NOTE — Telephone Encounter (Signed)
I put this in at her last visit. The referral should already be there.

## 2021-02-16 NOTE — Telephone Encounter (Signed)
Lawson Fiscal is supposed to come down and show you today.  Thank you so much Dr. Jeanice Lim!

## 2021-02-16 NOTE — Telephone Encounter (Signed)
Referral is not showing up in my workqueue.  I spoke with Lawson Fiscal and she found the order under other orders in patient's chart.  I was able to fax that referral to [facility]. But I am not able to document that other than to send you this message because it is not showing up on my queue.  I will let you know when I receive word from [facility].    Thank you!!!!

## 2021-02-16 NOTE — Telephone Encounter (Signed)
So I must be putting this in incorrectly then. Do you know which order I'm supposed to use? If not, I can just ask her.

## 2021-02-16 NOTE — Telephone Encounter (Signed)
[name], Shuntel's daughter called this morning inquiring about a referral to home health for her mother.  She states that her mom needs help with medication monitoring and monitoring her BP.  She stated that her mother told her that she had requested home health but had never heard anything.    [name] asked if you could put in a referral and see about getting her mother some help.  She stated that she did not have a preference between the 2 agencies.    I will be glad to get that referral sent over to one of the agencies if you will put in the referral.    Thank you!!

## 2021-02-17 ENCOUNTER — Other Ambulatory Visit (INDEPENDENT_AMBULATORY_CARE_PROVIDER_SITE_OTHER): Payer: Self-pay | Admitting: Internal Medicine

## 2021-02-17 ENCOUNTER — Telehealth (INDEPENDENT_AMBULATORY_CARE_PROVIDER_SITE_OTHER): Payer: Self-pay | Admitting: Internal Medicine

## 2021-02-17 ENCOUNTER — Encounter (INDEPENDENT_AMBULATORY_CARE_PROVIDER_SITE_OTHER): Payer: Self-pay | Admitting: Internal Medicine

## 2021-02-17 DIAGNOSIS — R350 Frequency of micturition: Secondary | ICD-10-CM

## 2021-02-17 MED ORDER — LOSARTAN 50 MG TABLET
25.0000 mg | ORAL_TABLET | Freq: Two times a day (BID) | ORAL | 3 refills | Status: DC
Start: 2021-02-17 — End: 2021-10-31

## 2021-02-17 NOTE — Telephone Encounter (Signed)
Home Health nurse stated patient is having urinary frequency and would like to have an order put in for a urinalysis and culture if possible. Nurse stated that if you want to contact her back you can call her cell phone with verbal orders. Phone number is the one attached to call.

## 2021-02-17 NOTE — Telephone Encounter (Signed)
Can you call and tell Home Health that the orders have been placed? Thanks.

## 2021-02-20 ENCOUNTER — Ambulatory Visit: Payer: Medicare Other | Attending: Internal Medicine | Admitting: Internal Medicine

## 2021-02-20 ENCOUNTER — Other Ambulatory Visit: Payer: Self-pay

## 2021-02-20 ENCOUNTER — Other Ambulatory Visit (HOSPITAL_COMMUNITY): Payer: Medicare Other

## 2021-02-20 DIAGNOSIS — I10 Essential (primary) hypertension: Secondary | ICD-10-CM | POA: Insufficient documentation

## 2021-02-20 DIAGNOSIS — N183 Chronic kidney disease, stage 3 unspecified: Secondary | ICD-10-CM | POA: Insufficient documentation

## 2021-02-20 DIAGNOSIS — E119 Type 2 diabetes mellitus without complications: Secondary | ICD-10-CM | POA: Insufficient documentation

## 2021-02-20 LAB — URINALYSIS, MACRO/MICRO
BILIRUBIN: NEGATIVE mg/dL
GLUCOSE: NEGATIVE mg/dL
KETONES: NEGATIVE mg/dL
NITRITE: NEGATIVE
PH: 6 (ref 4.6–8.0)
PROTEIN: 30 mg/dL — AB
SPECIFIC GRAVITY: 1.025 (ref 1.005–1.030)
UROBILINOGEN: 0.2 mg/dL (ref 0.2–1.0)

## 2021-02-20 LAB — URINALYSIS, MICROSCOPIC

## 2021-02-21 ENCOUNTER — Ambulatory Visit: Payer: Medicare Other | Attending: Internal Medicine | Admitting: Internal Medicine

## 2021-02-21 ENCOUNTER — Encounter (INDEPENDENT_AMBULATORY_CARE_PROVIDER_SITE_OTHER): Payer: Self-pay | Admitting: Internal Medicine

## 2021-02-21 VITALS — BP 200/100 | HR 78 | Temp 97.8°F | Resp 18 | Wt 106.4 lb

## 2021-02-21 DIAGNOSIS — N3001 Acute cystitis with hematuria: Secondary | ICD-10-CM | POA: Insufficient documentation

## 2021-02-21 DIAGNOSIS — N183 Chronic kidney disease, stage 3 unspecified: Secondary | ICD-10-CM | POA: Insufficient documentation

## 2021-02-21 DIAGNOSIS — I951 Orthostatic hypotension: Secondary | ICD-10-CM | POA: Insufficient documentation

## 2021-02-21 DIAGNOSIS — I129 Hypertensive chronic kidney disease with stage 1 through stage 4 chronic kidney disease, or unspecified chronic kidney disease: Secondary | ICD-10-CM | POA: Insufficient documentation

## 2021-02-21 DIAGNOSIS — I10 Essential (primary) hypertension: Secondary | ICD-10-CM

## 2021-02-21 MED ORDER — CEFDINIR 300 MG CAPSULE
300.0000 mg | ORAL_CAPSULE | Freq: Two times a day (BID) | ORAL | 0 refills | Status: DC
Start: 2021-02-21 — End: 2021-03-14

## 2021-02-21 NOTE — Progress Notes (Signed)
[facility]  PEDIATRICS/INTERNAL MEDICINE, [facility]  [address]  [place] [address]  [phone]  02/21/2021       Name:  Theresa Greene MRN: [id number]   Date:    02/21/2021 Age:  86 y.o.       Reason for Visit:  Chief Complaint   Patient presents with   . Follow Up     Following up on orthostatic hypotension. Pt. Brought log in today and said her BP has been reading high         Subjective:   Theresa Greene is a 86 y.o. female who comes in today for follow up on orthostatic hypotension. Theresa Greene's daughter had contacted the office through MyChart this morning and says that over the end of last week and this weekend, her blood pressure was running very high in the 190-200 systolic range. She had contacted the cardiologist and they increased her Lopressor to 25 mg every 8 hours and Cozaar 25 mg oral three times daily. Theresa Greene is still unclear on what medications she is taking here in the office today. Home Health is coming once per week to help with medication assistance. She has been taking her blood pressure at least 3 times daily since beginning July. It seems to be running around 150-200/70-90 depending on the time of day. She says that her dizziness is improved, but is not completely gone.     She also says that she has been getting up every hour and a half to go to the restroom to urinate. Home health had called last week requesting an order for urinalysis and urine culture. She was having burning and urinary frequency at that time. The urinalysis is a very dirty specimen with multiple squamous cells and 1+ bacteria with negative nitrites and positive leukocytes.   Initially had planned to wait for culture results to treat, but since she is symptomatic I will go ahead.     Past Medical History:    Past Medical History:   Diagnosis Date   . CAD S/P percutaneous coronary angioplasty 08/06/2014    Formatting of this note might be different  from the original. Pt reports 5 years ago   . Charcot's joint of foot, left 08/06/2014   . Gastroesophageal reflux disease 08/06/2014   . History of cardiac pacemaker 08/06/2014   . History of CVA (cerebrovascular accident) 08/06/2014    Formatting of this note might be different from the original. 1995, no residual deficits   . HTN (hypertension)    . Hyperlipidemia 12/25/2019   . Neuropathy (CMS HCC)    . Paroxysmal atrial fibrillation (CMS HCC) 08/06/2014   . PUD (peptic ulcer disease) 08/06/2014    Formatting of this note might be different from the original. C/b GIB Spring 2015   . Rheumatoid arthritis (CMS HCC) 08/06/2014   . Stage 3 chronic kidney disease (CMS HCC) 12/25/2019    Last Assessment & Plan:  Formatting of this note might be different from the original. Renal function stable, at baseline Stable electrolytes; restart sodium bicarb 650 mg BID  Continue renal protective ARB Discussed CKD risk factor modifications, including tight BP/glucose control, low protein diet, and avoidance of NSAIDs   . Type 2 diabetes mellitus (CMS HCC) 08/06/2014   . Vitamin D deficiency 12/25/2019      Past Surgical History:    Past Surgical History:   Procedure Laterality Date   . CORONARY ARTERY ANGIOPLASTY  2011   . HX BACK SURGERY      lower back x 3   . HX HIP REPLACEMENT Right    . HX NISSEN FUNDOPLICATION  2002    then three others to repair problems   . HX OPEN CHOLECYSTECTOMY  1962    with common duxt exploration   . HX PACEMAKER INSERTION  2019   . KNEE ARTHROPLASTY Right    . OPEN REDUCTION INTERNAL FIXATION FRACTURE FEMUR Right 05/07/2020    Performed by Jeanell Sparrow, MD at [facility] OR [facility]   . PANCREATIC PSEUDOCYST DRAINAGE  1971   . TOTAL ABDOMINAL HYSTERECTOMY       Family History:    Family Medical History:     Problem Relation (Age of Onset)    Ovarian Cancer Mother    Pancreatic Cancer Father           Social History:   Social History     Tobacco Use   . Smoking status: Never Smoker   . Smokeless  tobacco: Never Used   Substance Use Topics   . Alcohol use: Never      Social History     Substance and Sexual Activity   Drug Use Never      Social History     Social History Narrative    Lives at home with husband. 2 grown daughters - an Charity fundraiser and a Mining engineer. She did Building control surveyor for 23 years.         Allergies:    Allergies   Allergen Reactions   . Iodinated Contrast Media Anaphylaxis   . Etodolac NO Steroids unless approved by Attending Physician   . Iv Contrast      makese her serverly sick   . Codeine  Other Adverse Reaction (Add comment) and Nausea/ Vomiting     Stomach pain  Stomach pain        Home Medications:    Current Outpatient Medications:   .  amiodarone (PACERONE) 200 mg Oral Tablet, Take 1 Tablet (200 mg total) by mouth Once a day for 90 days, Disp: 90 Tablet, Rfl: 3  .  atorvastatin (LIPITOR) 20 mg Oral Tablet, Take 1 Tablet (20 mg total) by mouth Once a day for 90 days, Disp: 90 Tablet, Rfl: 3  .  calcitrioL (ROCALTROL) 0.25 mcg Oral Capsule, Take 1 Capsule (0.25 mcg total) by mouth Once a day for 90 days, Disp: 90 Capsule, Rfl: 3  .  cefdinir (OMNICEF) 300 mg Oral Capsule, Take 1 Capsule (300 mg total) by mouth Twice daily for 7 days, Disp: 14 Capsule, Rfl: 0  .  clopidogreL (PLAVIX) 75 mg Oral Tablet, Take 1 Tablet (75 mg total) by mouth Once a day for 90 days, Disp: 90 Tablet, Rfl: 3  .  CREON 36,000-114,000- 180,000 unit Oral Capsule, Delayed Release(E.C.), , Disp: , Rfl:   .  DEXILANT 60 mg Oral Cap, Delayed Rel., Multiphasic, , Disp: , Rfl:   .  ergocalciferol, vitamin D2, (DRISDOL) 1,250 mcg (50,000 unit) Oral Capsule, Take 1 Capsule (50,000 Units total) by mouth Every 7 days for 90 days, Disp: 12 Capsule, Rfl: 3  .  ezetimibe (ZETIA) 10 mg Oral Tablet, Take 1 Tablet (10 mg total) by mouth Once a day for 90 days, Disp: 90 Tablet, Rfl: 3  .  gabapentin (NEURONTIN) 100 mg Oral Capsule, Take 1 Capsule (100 mg total) by mouth Twice daily for 90 days, Disp: 180 Capsule, Rfl: 0  .  gemfibroziL (LOPID) 600 mg Oral Tablet, Take 1 Tablet (600 mg total) by mouth Twice a day before meals for 90 days, Disp: 180 Tablet, Rfl: 3  .  losartan (COZAAR) 50 mg Oral Tablet, Take 0.5 Tablets (25 mg total) by mouth Twice daily for 90 days (Patient taking differently: Take 25 mg by mouth Three times a day), Disp: 90 Tablet, Rfl: 3  .  metoprolol tartrate (LOPRESSOR) 25 mg Oral Tablet, Take 25 mg by mouth Three times a day, Disp: , Rfl:   .  sodium bicarbonate 650 mg Oral Tablet, Take 1 Tablet (650 mg total) by mouth Twice daily, Disp: 180 Tablet, Rfl: 3       Review of Systems:   All systems have been reviewed and found to be negative except for as stated above in the history of present illness.     Physical Examination:   Vitals: BP (!) 200/100   Pulse 78   Temp 36.6 C (97.8 F) (Thermal Scan)   Resp 18   Wt 48.3 kg (106 lb 6.4 oz)   SpO2 98%   BMI 17.17 kg/m     General:  No apparent distress.  Family at bedside.  Interactive.  Frail.   Neck:  Supple. Trachea is midline. No JVD. No adenopathy. No thyromegaly.  Respiratory: No respiratory distress. Lungs clear to auscultation bilaterally. No wheezes, rales, or rhonchi.  Cardiovascular: Regular rate and rhythm. S1, S2 present. No murmurs, rubs, or gallops. Pulses are +2 and equal in all extremities.  Extremities:  No clubbing, cyanosis or edema.     Assessment:   (I95.1) Orthostatic hypotension  (primary encounter diagnosis)    (I10) Primary hypertension  Plan: BASIC METABOLIC PANEL    (N18.30) Stage 3 chronic kidney disease (CMS HCC)  Plan: BASIC METABOLIC PANEL    (N30.01) Acute cystitis with hematuria     Plan:   I suspect that this period of low blood pressures and dizziness was COVID related. She may not have been eating and drinking as well during that period also. I am worried that there are too many providers adjusting her medication which is going to lead to more confusion. Right now she is on Lopressor 25 mg three times daily. I would prefer  to have left this as Toprol XL, but it was changed by Cardiology or Nephrology, I am not sure. Continue on this. I am going to have her increase the Cozaar to 50 mg twice daily and continue to monitor her blood pressure at home. Follow up in 2 weeks. I do want to check a BMP prior to her visit to make sure that the renal function does not worsen with the increase in the Cozaar. I am trying to hold off on increasing the Lopressor because I do not want to suppress her heart rate.     Renal function has slowly deteriorated since September. Her GFR dropped from 61 at that time down to 36 at the last check. Check BMP prior to appointment.     If I remember correctly, she has gotten hypertensive with UTIs in the past. Her urine is dirty today, but I am not convinced that this is a true infection.  However, since she is symptomatic, I am going to go ahead and treat her with Omnicef 300 mg oral twice daily for 7 days and follow the culture results.     Patient was counseled on diagnosis, follow-up, and side effect of medications.  Reasons for follow up in clinic  or emergency department were discussed.    Orders:    Orders Placed This Encounter   . BASIC METABOLIC PANEL   . cefdinir (OMNICEF) 300 mg Oral Capsule      Referrals:   Orders Placed This Encounter   No orders of the following type(s) were placed in this encounter: Outpatient Referral.      Discharge Medication List:       Current Discharge Medication List          Accurate as of February 21, 2021 12:29 PM. If you have any questions, ask your nurse or doctor.            START taking these medications.      Details   cefdinir 300 mg Capsule  Commonly known as: OMNICEF  Started by: Satonya Lux Hill-Reinert, DO   300 mg, Oral, 2 TIMES DAILY  Qty: 14 Capsule  Refills: 0        CONTINUE these medications which have CHANGED during your visit.      Details   losartan 50 mg Tablet  Commonly known as: COZAAR  What changed: when to take this   25 mg, Oral, 2 TIMES DAILY  Qty: 90  Tablet  Refills: 3        CONTINUE these medications - NO CHANGES were made during your visit.      Details   amiodarone 200 mg Tablet  Commonly known as: PACERONE   200 mg, Oral, DAILY  Qty: 90 Tablet  Refills: 3     atorvastatin 20 mg Tablet  Commonly known as: LIPITOR   20 mg, Oral, DAILY  Qty: 90 Tablet  Refills: 3     calcitrioL 0.25 mcg Capsule  Commonly known as: ROCALTROL   0.25 mcg, Oral, DAILY  Qty: 90 Capsule  Refills: 3     clopidogreL 75 mg Tablet  Commonly known as: PLAVIX   75 mg, Oral, DAILY  Qty: 90 Tablet  Refills: 3     Creon 36,000-114,000- 180,000 unit Capsule, Delayed Release(E.C.)  Generic drug: lipase-protease-amylase   No dose, route, or frequency recorded.  Refills: 0     Dexilant 60 mg Cap, Delayed Rel., Multiphasic  Generic drug: Dexlansoprazole   No dose, route, or frequency recorded.  Refills: 0     ergocalciferol (vitamin D2) 1,250 mcg (50,000 unit) Capsule  Commonly known as: DRISDOL   50,000 Units, Oral, EVERY 7 DAYS  Qty: 12 Capsule  Refills: 3     ezetimibe 10 mg Tablet  Commonly known as: ZETIA   10 mg, Oral, DAILY  Qty: 90 Tablet  Refills: 3     gabapentin 100 mg Capsule  Commonly known as: NEURONTIN   100 mg, Oral, 2 TIMES DAILY  Qty: 180 Capsule  Refills: 0     gemfibroziL 600 mg Tablet  Commonly known as: LOPID   600 mg, Oral, 2 TIMES DAILY BEFORE MEALS  Qty: 180 Tablet  Refills: 3     metoprolol tartrate 25 mg Tablet  Commonly known as: LOPRESSOR   25 mg, Oral, 3 TIMES DAILY  Refills: 0     sodium bicarbonate 650 mg Tablet   650 mg, Oral, 2 TIMES DAILY  Qty: 180 Tablet  Refills: 3        STOP taking these medications.    metoprolol succinate 25 mg Tablet Sustained Release 24 hr  Commonly known as: TOPROL-XL  Stopped by: [employer], DO     NIFEdipine 30 mg Tablet Extended Rel 24  hr  Commonly known as: PROCARDIA XL  Stopped by: [employer], DO               Follow up: Return in about 2 weeks (around 03/07/2021) for BP check and annual wellness exam. Patient was  advised to follow up sooner if issues arise.     Janazia Schreier Hill-Reinert, DO    This note was partially created using M*Modal fluency direct system (voice recognition software ) and is inherently subject to errors including those of syntax and "sound- alike" substitutions which may escape proofreading.  In such instances, original meaning may be extrapolated by contextual derivation.

## 2021-02-21 NOTE — Nursing Note (Signed)
Travel Screening     Question   Response    In the last 10 days, have you been in contact with someone who was confirmed or suspected to have Coronavirus/COVID-19?  Unable to assess    Have you had a COVID-19 viral test in the last 10 days?  Unable to assess    Do you have any of the following new or worsening symptoms?  Unable to assess    Have you traveled internationally or domestically in the last month?  Unable to assess      Travel History   Travel since 01/22/21    No documented travel since 01/22/21         PHQ Questionnaire  Little interest or pleasure in doing things.: (P) Not at all  Feeling down, depressed, or hopeless: (P) Not at all  PHQ 2 Total: (P) 0        Fall Risk Assessment  Do you feel unsteady when standing or walking?: Yes  Do you worry about falling?: Yes  Have you fallen in the past year?: Yes  How many times have you fallen?: Once  Were you ever injured from falling?: Yes

## 2021-02-21 NOTE — Nursing Note (Signed)
02/21/21 0947   Depression Screen   Little interest or pleasure in doing things. 0   Feeling down, depressed, or hopeless 0   PHQ 2 Total 0

## 2021-02-21 NOTE — Nursing Note (Signed)
02/21/21 0947   Fall Risk Assessment   Do you feel unsteady when standing or walking? Yes   Do you worry about falling? Yes   Have you fallen in the past year? Yes   How many times have you fallen? Once   Were you ever injured from falling? Yes

## 2021-02-22 LAB — URINE CULTURE: URINE CULTURE: >100000 — AB

## 2021-02-24 ENCOUNTER — Telehealth (INDEPENDENT_AMBULATORY_CARE_PROVIDER_SITE_OTHER): Payer: Self-pay | Admitting: Internal Medicine

## 2021-02-24 ENCOUNTER — Other Ambulatory Visit (INDEPENDENT_AMBULATORY_CARE_PROVIDER_SITE_OTHER): Payer: Self-pay | Admitting: Internal Medicine

## 2021-02-24 MED ORDER — METOPROLOL SUCCINATE ER 100 MG TABLET,EXTENDED RELEASE 24 HR
100.0000 mg | ORAL_TABLET | Freq: Every day | ORAL | 4 refills | Status: DC
Start: 2021-02-24 — End: 2022-01-16

## 2021-02-24 NOTE — Telephone Encounter (Signed)
Please call and let her daughter know that Home Health should be coming in. It looked like they tried to call her on Wednesday and she did not answer. I have been working with her other daughter [name] through MyChart to adjust the medication. When I saw her on Tuesday, we  increased the Cozaar to 50 mg twice daily. It is going to take about a week to see the maximum results of this. I had asked her to stop taking the blood pressure so often. Once a day is enough unless she is feeling sick. I had asked her to do it at different times each day so we get an average. It is going to go up and down all day long. We don't not have to treat like we would in a hospitalized patient. No PRN medication is needed. My goal for her is really 150 systolic because I do not want her to have side effects from low blood pressure like she was having when she was overmedicated. To get her through the weekend, I want her to stop all of the Lopressor and I am going to change her over to Toprol XL 100 mg daily. She can start it tonight. It will also take it about a week to have its max effect. This is basically combining all of the Lopressor dosing into a daily dose and increasing it by 25 mg to get the BP down a little more. Unless she is having severe headache, blurry vision, dizziness, chest pain, or shortness of breath, she really only needs to check the blood pressure once a day. Checking it an  hour after taking her medication is not going to be an indication that we need to use more or less medication. These medications work over a 24-48 hour time period so within 1 hour, they have not really had any effect. I will send the Toprol XL to the pharmacy. Have them to discard all of the Lopressor so that she does not get confused. Ladona Ridgel, you may also want to call the Home Health nurse and let them know that I have made this change. Maybe they can swing by and check on her later today or Monday. Not sure if they work on the weekend or not.

## 2021-02-24 NOTE — Telephone Encounter (Signed)
Can you call her back and let her know that if it is running that high and she has a headache, then she needs to go to the Emergency Department so that we are not dealing with a stroke. I do not use PRN medications as an outpatient because they are not safe and hypertensive urgency/emergency should only be treated in a hospital setting.

## 2021-02-24 NOTE — Telephone Encounter (Signed)
Daughter called stating her BP is running high and wants to get something done before the weekend

## 2021-03-01 ENCOUNTER — Encounter (HOSPITAL_COMMUNITY): Payer: Self-pay

## 2021-03-01 ENCOUNTER — Other Ambulatory Visit: Payer: Self-pay

## 2021-03-01 ENCOUNTER — Emergency Department
Admission: EM | Admit: 2021-03-01 | Discharge: 2021-03-02 | Disposition: A | Discharge status: Home / Self Care | Payer: Medicare Other | Attending: Emergency Medicine | Admitting: Emergency Medicine

## 2021-03-01 ENCOUNTER — Emergency Department (EMERGENCY_DEPARTMENT_HOSPITAL): Payer: Medicare Other

## 2021-03-01 DIAGNOSIS — Z950 Presence of cardiac pacemaker: Secondary | ICD-10-CM | POA: Insufficient documentation

## 2021-03-01 DIAGNOSIS — I10 Essential (primary) hypertension: Secondary | ICD-10-CM

## 2021-03-01 DIAGNOSIS — I480 Paroxysmal atrial fibrillation: Secondary | ICD-10-CM | POA: Insufficient documentation

## 2021-03-01 DIAGNOSIS — Z79899 Other long term (current) drug therapy: Secondary | ICD-10-CM | POA: Insufficient documentation

## 2021-03-01 DIAGNOSIS — Z961 Presence of intraocular lens: Secondary | ICD-10-CM

## 2021-03-01 DIAGNOSIS — Z7902 Long term (current) use of antithrombotics/antiplatelets: Secondary | ICD-10-CM | POA: Insufficient documentation

## 2021-03-01 DIAGNOSIS — R519 Headache, unspecified: Secondary | ICD-10-CM | POA: Insufficient documentation

## 2021-03-01 DIAGNOSIS — G9389 Other specified disorders of brain: Secondary | ICD-10-CM

## 2021-03-01 LAB — CBC WITH DIFF
BASOPHIL #: <0.1 10*3/uL (ref <=0.20)
BASOPHIL %: 1 %
EOSINOPHIL #: 0.28 10*3/uL (ref <=0.50)
EOSINOPHIL %: 3 %
HCT: 40.6 % (ref 34.8–46.0)
HGB: 12.6 g/dL (ref 11.5–16.0)
IMMATURE GRANULOCYTE #: <0.1 10*3/uL (ref <0.10)
IMMATURE GRANULOCYTE %: 0 % (ref 0–1)
LYMPHOCYTE #: 2.72 10*3/uL (ref 1.00–4.80)
LYMPHOCYTE %: 33 %
MCH: 30.8 pg (ref 26.0–32.0)
MCHC: 31 g/dL (ref 31.0–35.5)
MCV: 99.3 fL (ref 78.0–100.0)
MONOCYTE #: 0.91 10*3/uL (ref 0.20–1.10)
MONOCYTE %: 11 %
MPV: 10.1 fL (ref 8.7–12.5)
NEUTROPHIL #: 4.29 10*3/uL (ref 1.50–7.70)
NEUTROPHIL %: 52 %
PLATELETS: 332 10*3/uL (ref 150–400)
RBC: 4.09 10*6/uL (ref 3.85–5.22)
RDW-CV: 14.4 % (ref 11.5–15.5)
WBC: 8.3 10*3/uL (ref 3.7–11.0)

## 2021-03-01 LAB — COMPREHENSIVE METABOLIC PANEL, NON-FASTING
ALBUMIN/GLOBULIN RATIO: 1.3 (ref 1.0–2.0)
ALBUMIN: 3.7 g/dL (ref 3.5–5.2)
ALKALINE PHOSPHATASE: 116 U/L — ABNORMAL HIGH (ref 35–104)
ALT (SGPT): 10 U/L (ref 0–33)
ANION GAP: 12 mmol/L (ref 10–21)
AST (SGOT): 22 U/L (ref 0–32)
BILIRUBIN TOTAL: 0.3 mg/dL (ref 0.0–1.0)
BUN/CREA RATIO: 22
BUN: 35 mg/dL — ABNORMAL HIGH (ref 6–20)
CALCIUM: 9 mg/dL (ref 8.4–10.5)
CHLORIDE: 106 mmol/L (ref 96–107)
CO2 TOTAL: 18 mmol/L — ABNORMAL LOW (ref 22–29)
CREATININE: 1.59 mg/dL — ABNORMAL HIGH (ref 0.50–0.90)
ESTIMATED GFR: 32 mL/min/{1.73_m2} — ABNORMAL LOW (ref >60)
GLUCOSE: 134 mg/dL — ABNORMAL HIGH (ref 74–106)
POTASSIUM: 3.8 mmol/L (ref 3.5–5.1)
PROTEIN TOTAL: 6.5 g/dL — ABNORMAL LOW (ref 6.6–8.7)
SODIUM: 136 mmol/L (ref 136–145)

## 2021-03-01 LAB — PT/INR
INR: 0.94 — ABNORMAL LOW (ref 2.00–3.00)
PROTHROMBIN TIME: 9.4 seconds (ref 9.3–11.4)

## 2021-03-01 LAB — PTT (PARTIAL THROMBOPLASTIN TIME): APTT: 22.6 seconds — ABNORMAL LOW (ref 25.0–36.0)

## 2021-03-01 LAB — TROPONIN-T: TROPONIN-T: <0.01 ng/mL (ref 0.000–0.010)

## 2021-03-01 MED ORDER — LABETALOL 5 MG/ML INTRAVENOUS SOLUTION
20.0000 mg | INTRAVENOUS | Status: AC
Start: 2021-03-01 — End: 2021-03-01
  Administered 2021-03-01: 20 mg via INTRAVENOUS
  Filled 2021-03-01: qty 20

## 2021-03-01 MED ORDER — LABETALOL 5 MG/ML INTRAVENOUS SOLUTION
20.0000 mg | INTRAVENOUS | Status: AC
Start: 2021-03-02 — End: 2021-03-01
  Administered 2021-03-01: 20 mg via INTRAVENOUS

## 2021-03-01 NOTE — ED Triage Notes (Signed)
C/o high blood pressure and headache this week. Worse today. Blurred vision.

## 2021-03-02 ENCOUNTER — Other Ambulatory Visit (INDEPENDENT_AMBULATORY_CARE_PROVIDER_SITE_OTHER): Payer: Self-pay | Admitting: Internal Medicine

## 2021-03-02 ENCOUNTER — Telehealth (INDEPENDENT_AMBULATORY_CARE_PROVIDER_SITE_OTHER): Payer: Self-pay | Admitting: Internal Medicine

## 2021-03-02 LAB — GRAY TOP TUBE

## 2021-03-02 LAB — GOLD TOP TUBE

## 2021-03-02 MED ORDER — AMLODIPINE 5 MG TABLET
5.0000 mg | ORAL_TABLET | Freq: Every day | ORAL | 4 refills | Status: DC
Start: 2021-03-02 — End: 2022-04-29

## 2021-03-02 MED ORDER — ENALAPRILAT 1.25 MG/ML INTRAVENOUS SOLUTION
1.2500 mg | INTRAVENOUS | Status: AC
Start: 2021-03-02 — End: 2021-03-02
  Administered 2021-03-02: 1.25 mg via INTRAVENOUS
  Filled 2021-03-02 (×2): qty 1

## 2021-03-02 MED ORDER — CLONIDINE HCL 0.1 MG TABLET
0.1000 mg | ORAL_TABLET | Freq: Three times a day (TID) | ORAL | 0 refills | Status: DC | PRN
Start: 2021-03-02 — End: 2021-03-10

## 2021-03-02 NOTE — Telephone Encounter (Signed)
Post Ed Follow-Up    Post ED Follow-Up:   Document completed and/or attempted interactive contact(s) after transition to home after emergency department stay.:   Transition Facility and relevant Date:   Discharge Date: 03/01/21  Discharge from [facility] Emergency Department?: Yes  Discharge Facility: [facility]  Contacted by: Theresa Savage, lpn   Contact method: Patient/Caregiver Telephone  Contact first attempt: 03/02/2021  1:51 PM  MyChart message sent?: No  Was the AVS reviewed with patient?: Yes  How is the patient recovering?: No better or worse  Medications prescribed: Yes  Were they obtained?: Yes  Interventions: follow-up appointment scheduled for patient  Pt. BP is running low now and pt. Passed out today while at home. Daughter voiced her concerns and is wanting to try and do a MyChart video visit tomorrow morning.

## 2021-03-02 NOTE — Discharge Instructions (Signed)
Follow-up with your PCP as scheduled or in 2 days.  Return to the emergency department if you have worsening of her symptoms or develops any new or concerning symptoms.  Continue taking her medication as prescribed.

## 2021-03-02 NOTE — Telephone Encounter (Signed)
Needs to be in person 

## 2021-03-02 NOTE — ED Provider Notes (Signed)
Department of Emergency Medicine  [facility]  03/01/2021            HPI:  Patient is a 86 y.o. female presenting to the ED with chief complaint of  having elevated blood pressure for the past month.  It has been gradually creeping higher and higher.  Today her systolic blood pressures were 200 and above.  She just recently saw her PCP and had changes to her medications per blood pressures continue to run high.  She states for the past 2-3 days she has had a headache across the frontal aspect of her head.  She denies any focal neurological weakness or deficits.  No lightheadedness or dizziness.  No chest pain, shortness of breath or cough.  No fever, chills or sweats.  She denies any abdominal pain.  She denies any nausea, vomiting or diarrhea.  She came to the ER tonight because she was concerned when her blood pressure was staying 200 and above.        Past Medical History:   Diagnosis Date   . CAD S/P percutaneous coronary angioplasty 08/06/2014    Formatting of this note might be different from the original. Pt reports 5 years ago   . Charcot's joint of foot, left 08/06/2014   . Gastroesophageal reflux disease 08/06/2014   . History of cardiac pacemaker 08/06/2014   . History of CVA (cerebrovascular accident) 08/06/2014    Formatting of this note might be different from the original. 1995, no residual deficits   . HTN (hypertension)    . Hyperlipidemia 12/25/2019   . Neuropathy (CMS HCC)    . Paroxysmal atrial fibrillation (CMS HCC) 08/06/2014   . PUD (peptic ulcer disease) 08/06/2014    Formatting of this note might be different from the original. C/b GIB Spring 2015   . Rheumatoid arthritis (CMS [place]) 08/06/2014   . Stage 3 chronic kidney disease (CMS [place]) 12/25/2019    Last Assessment & Plan:  Formatting of this note might be different from the original. Renal function stable, at baseline Stable electrolytes; restart sodium bicarb 650 mg BID  Continue renal protective ARB Discussed CKD risk  factor modifications, including tight BP/glucose control, low protein diet, and avoidance of NSAIDs   . Type 2 diabetes mellitus (CMS [place]) 08/06/2014   . Vitamin D deficiency 12/25/2019     Past Surgical History:   Procedure Laterality Date   . Coronary artery angioplasty  2011   . Hx back surgery     . Hx hip replacement Right    . Hx nissen fundoplication  7253   . Hx open cholecystectomy  1962   . Hx pacemaker insertion  2019   . Knee arthroplasty Right    . Pancreatic pseudocyst drainage  1971   . Total abdominal hysterectomy       Allergies   Allergen Reactions   . Iodinated Contrast Media Anaphylaxis   . Etodolac NO Steroids unless approved by Attending Physician   . Iv Contrast      makese her serverly sick   . Codeine  Other Adverse Reaction (Add comment) and Nausea/ Vomiting     Stomach pain  Stomach pain        Current Outpatient Medications   Medication Sig   . amiodarone (PACERONE) 200 mg Oral Tablet Take 1 Tablet (200 mg total) by mouth Once a day for 90 days   . atorvastatin (LIPITOR) 20 mg Oral Tablet Take 1 Tablet (20 mg total) by mouth  Once a day for 90 days   . calcitrioL (ROCALTROL) 0.25 mcg Oral Capsule Take 1 Capsule (0.25 mcg total) by mouth Once a day for 90 days   . cloNIDine HCL (CATAPRES) 0.1 mg Oral Tablet Take 1 Tablet (0.1 mg total) by mouth Every 8 hours as needed for Other (BP greater than 180/90)   . clopidogreL (PLAVIX) 75 mg Oral Tablet Take 1 Tablet (75 mg total) by mouth Once a day for 90 days   . CREON 36,000-114,000- 180,000 unit Oral Capsule, Delayed Release(E.C.)    . DEXILANT 60 mg Oral Cap, Delayed Rel., Multiphasic    . ergocalciferol, vitamin D2, (DRISDOL) 1,250 mcg (50,000 unit) Oral Capsule Take 1 Capsule (50,000 Units total) by mouth Every 7 days for 90 days   . ezetimibe (ZETIA) 10 mg Oral Tablet Take 1 Tablet (10 mg total) by mouth Once a day for 90 days   . gabapentin (NEURONTIN) 100 mg Oral Capsule Take 1 Capsule (100 mg total) by mouth Twice daily for 90 days   .  gemfibroziL (LOPID) 600 mg Oral Tablet Take 1 Tablet (600 mg total) by mouth Twice a day before meals for 90 days   . losartan (COZAAR) 50 mg Oral Tablet Take 0.5 Tablets (25 mg total) by mouth Twice daily for 90 days (Patient taking differently: Take 50 mg by mouth Twice daily)   . metoprolol succinate (TOPROL-XL) 100 mg Oral Tablet Sustained Release 24 hr Take 1 Tablet (100 mg total) by mouth Once a day (Patient taking differently: Take 100 mg by mouth Twice daily)   . sodium bicarbonate 650 mg Oral Tablet Take 1 Tablet (650 mg total) by mouth Twice daily      Above history reviewed with patient.  Previous records also reviewed.     Review of Systems   Constitutional: Negative for chills, diaphoresis, fever, malaise/fatigue and weight loss.   HENT: Negative for congestion, ear discharge, ear pain, hearing loss, nosebleeds, sinus pain, sore throat and tinnitus.    Eyes: Negative for blurred vision, double vision, photophobia, pain, discharge and redness.   Respiratory: Negative for cough, hemoptysis, sputum production, shortness of breath, wheezing and stridor.    Cardiovascular: Negative for chest pain, palpitations, orthopnea, claudication, leg swelling and PND.   Gastrointestinal: Negative for abdominal pain, blood in stool, constipation, diarrhea, heartburn, melena, nausea and vomiting.   Genitourinary: Negative for dysuria, flank pain, frequency, hematuria and urgency.   Musculoskeletal: Negative for back pain, joint pain, myalgias and neck pain.   Skin: Negative for itching and rash.   Neurological: Positive for headaches. Negative for dizziness, focal weakness, seizures, loss of consciousness and weakness.            All other systems reviewed and are negative, unless commented on in the HPI.       Filed Vitals:    03/02/21 0000 03/02/21 0030 03/02/21 0152 03/02/21 0200   BP: (!) 188/85 (!) 187/86 (!) 200/91 (!) 178/88   Pulse: 70 70  70   Resp: 17 17  19    Temp:       SpO2: 94% 94%  93%       Physical  Exam  Vitals and nursing note reviewed.   Constitutional:       General: She is not in acute distress.     Appearance: Normal appearance. She is normal weight. She is not ill-appearing or diaphoretic.   HENT:      Head: Normocephalic and atraumatic.  Nose: Nose normal.      Mouth/Throat:      Mouth: Mucous membranes are moist.      Pharynx: Oropharynx is clear.   Eyes:      Extraocular Movements: Extraocular movements intact.      Conjunctiva/sclera: Conjunctivae normal.      Pupils: Pupils are equal, round, and reactive to light.   Neck:      Thyroid: No thyromegaly.      Vascular: No JVD.      Trachea: No tracheal deviation.   Cardiovascular:      Rate and Rhythm: Normal rate and regular rhythm.      Pulses: Normal pulses.      Heart sounds: Normal heart sounds. No murmur heard.    No friction rub. No gallop.   Pulmonary:      Effort: Pulmonary effort is normal. No respiratory distress.      Breath sounds: Normal breath sounds. No stridor. No wheezing, rhonchi or rales.   Abdominal:      General: Abdomen is flat. Bowel sounds are normal. There is no distension.      Palpations: Abdomen is soft. There is no mass.      Tenderness: There is no abdominal tenderness. There is no guarding or rebound.      Hernia: No hernia is present.   Musculoskeletal:         General: No swelling or tenderness. Normal range of motion.      Cervical back: Normal range of motion and neck supple.      Right lower leg: No edema.      Left lower leg: No edema.   Lymphadenopathy:      Cervical: No cervical adenopathy.   Skin:     General: Skin is warm and dry.      Capillary Refill: Capillary refill takes less than 2 seconds.      Findings: No rash.   Neurological:      General: No focal deficit present.      Mental Status: She is alert and oriented to person, place, and time.      Cranial Nerves: No cranial nerve deficit.      Gait: Gait is intact.      Deep Tendon Reflexes: Reflexes are normal and symmetric.   Psychiatric:          Mood and Affect: Mood and affect normal.         Cognition and Memory: Memory normal.         Judgment: Judgment normal.            Workup:     Results for orders placed or performed during the hospital encounter of 03/01/21 (from the past 24 hour(s))   CBC/DIFF    Narrative    The following orders were created for panel order CBC/DIFF.  Procedure                               Abnormality         Status                     ---------                               -----------         ------  CBC WITH MGNO[[id number]]                                    Final result                 Please view results for these tests on the individual orders.   COMPREHENSIVE METABOLIC PANEL, NON-FASTING   Result Value Ref Range    SODIUM 136 136 - 145 mmol/L    POTASSIUM 3.8 3.5 - 5.1 mmol/L    CHLORIDE 106 96 - 107 mmol/L    CO2 TOTAL 18 (L) 22 - 29 mmol/L    ANION GAP 12 10 - 21 mmol/L    BUN 35 (H) 6 - 20 mg/dL    CREATININE 1.59 (H) 0.50 - 0.90 mg/dL    BUN/CREA RATIO 22     ESTIMATED GFR 32 (L) >60 mL/min/1.39m2    ALBUMIN 3.7 3.5 - 5.2 g/dL    CALCIUM 9.0 8.4 - 10.5 mg/dL    GLUCOSE 134 (H) 74 - 106 mg/dL    ALKALINE PHOSPHATASE 116 (H) 35 - 104 U/L    ALT (SGPT) 10 0 - 33 U/L    AST (SGOT) 22 0 - 32 U/L    BILIRUBIN TOTAL 0.3 0.0 - 1.0 mg/dL    PROTEIN TOTAL 6.5 (L) 6.6 - 8.7 g/dL    ALBUMIN/GLOBULIN RATIO 1.3 1.0 - 2.0    Narrative    Estimated Glomerular Filtration Rate (eGFR) is calculated using the CKD-EPI (2021) equation, intended for patients 110years of age and older. If gender is not documented or "unknown", there will be no eGFR calculation.   TROPONIN-T   Result Value Ref Range    TROPONIN-T <0.010 0.000 - 0.010 ng/mL   PT/INR   Result Value Ref Range    PROTHROMBIN TIME 9.4 9.3 - 11.4 seconds    INR 0.94 (L) 2.00 - 3.00   PTT (PARTIAL THROMBOPLASTIN TIME)   Result Value Ref Range    APTT 22.6 (L) 25.0 - 36.0 seconds   CBC WITH DIFF   Result Value Ref Range    WBC 8.3 3.7 - 11.0 x10^3/uL    RBC 4.09  3.85 - 5.22 x10^6/uL    HGB 12.6 11.5 - 16.0 g/dL    HCT 40.6 34.8 - 46.0 %    MCV 99.3 78.0 - 100.0 fL    MCH 30.8 26.0 - 32.0 pg    MCHC 31.0 31.0 - 35.5 g/dL    RDW-CV 14.4 11.5 - 15.5 %    PLATELETS 332 150 - 400 x10^3/uL    MPV 10.1 8.7 - 12.5 fL    NEUTROPHIL % 52 %    LYMPHOCYTE % 33 %    MONOCYTE % 11 %    EOSINOPHIL % 3 %    BASOPHIL % 1 %    NEUTROPHIL # 4.29 1.50 - 7.70 x10^3/uL    LYMPHOCYTE # 2.72 1.00 - 4.80 x10^3/uL    MONOCYTE # 0.91 0.20 - 1.10 x10^3/uL    EOSINOPHIL # 0.28 <=0.50 x10^3/uL    BASOPHIL # <0.10 <=0.20 x10^3/uL    IMMATURE GRANULOCYTE % 0 0 - 1 %    IMMATURE GRANULOCYTE # <0.10 <0.10 x10^3/uL   EXTRA TUBES    Narrative    The following orders were created for panel order EXTRA TUBES.  Procedure  Abnormality         Status                     ---------                               -----------         ------                     GOLD TOP EXNT[[id number]]                                    In process                 GRAY TOP HQPR[[id number]]                                    Final result                 Please view results for these tests on the individual orders.   GRAY TOP TUBE   Result Value Ref Range    RAINBOW/EXTRA TUBE AUTO RESULT Yes        Results for orders placed or performed during the hospital encounter of 03/01/21 (from the past 72 hour(s))   XR CHEST AP     Status: None    Narrative    [place]    Female, 86 years old.    XR CHEST AP performed on 03/01/2021 10:34 PM.    REASON FOR EXAM:  hypertension/headache    TECHNIQUE: 1 views/1 images submitted for interpretation.    COMPARISON:  Chest x-ray dated 01/01/2021.    FINDINGS:  Chronic appearing changes of the lung fields are evident. There is no lobar consolidation. Heart silhouette is unchanged from prior study but not well assessed on portable technique. An implantable cardiac device is in place. There are degenerative changes of the osseous structures noted. High riding of the humeral  heads compatible with chronic rotator cuff insufficiency.      Impression    Chronic appearing changes, no lobar consolidation or frank pulmonary edema.      Radiologist location ID: [id number]     CT BRAIN WO IV CONTRAST     Status: None    Narrative    Theresa Greene    Female, 86 years old.    CT BRAIN WO IV CONTRAST performed on 03/01/2021 10:34 PM.    REASON FOR EXAM:  Elevated blood pressure, headache    RADIATION DOSE: 1039.30 mGycm    TECHNIQUE: Noncontrast CT brain was performed. Multiplanar reformats provided.    COMPARISON: CT brain dated 05/06/2020.    FINDINGS:  No intracranial mass, hemorrhage, or abnormal extra-axial fluid is present. There is no midline shift or mass effect. There is no hydrocephalus. There is mild generalized volume loss. A few patchy areas of white matter lucency are evident, probably on the basis of small vessel ischemic change. Prior surgical changes after right retromastoid craniotomy are evident. Chronic appearing changes of the maxillary sinuses are evident. Probable prior surgical changes after functional endoscopic sinus surgery. Globe pseudophakia.      Impression    Chronic appearing changes, no convincing acute intracranial abnormality on noncontrast CT brain. Consider vascular imaging  or MRI follow-up as indicated.      Radiologist location ID: [id number]         Orders Placed This Encounter   . XR CHEST AP   . CT BRAIN WO IV CONTRAST   . CBC/DIFF   . COMPREHENSIVE METABOLIC PANEL, NON-FASTING   . TROPONIN-T   . PT/INR   . PTT (PARTIAL THROMBOPLASTIN TIME)   . CBC WITH DIFF   . EXTRA TUBES   . GOLD TOP TUBE   . GRAY TOP TUBE   . ECG 12 LEAD   . INSERT & MAINTAIN PERIPHERAL IV ACCESS   . labetalol (TRANDATE) 5 mg/mL injection   . labetalol (TRANDATE) 5 mg/mL injection   . enalaprilat (VASOTEC) 1.25 mg/mL injection   . cloNIDine HCL (CATAPRES) 0.1 mg Oral Tablet       ECG:  Paced rhythm    Abnormal Lab results:  Labs Reviewed   COMPREHENSIVE METABOLIC PANEL, NON-FASTING -  Abnormal; Notable for the following components:       Result Value    CO2 TOTAL 18 (*)     BUN 35 (*)     CREATININE 1.59 (*)     ESTIMATED GFR 32 (*)     GLUCOSE 134 (*)     ALKALINE PHOSPHATASE 116 (*)     PROTEIN TOTAL 6.5 (*)     All other components within normal limits    Narrative:     Estimated Glomerular Filtration Rate (eGFR) is calculated using the CKD-EPI (2021) equation, intended for patients 7 years of age and older. If gender is not documented or "unknown", there will be no eGFR calculation.   PT/INR - Abnormal; Notable for the following components:    INR 0.94 (*)     All other components within normal limits   PTT (PARTIAL THROMBOPLASTIN TIME) - Abnormal; Notable for the following components:    APTT 22.6 (*)     All other components within normal limits   TROPONIN-T - Normal   CBC/DIFF    Narrative:     The following orders were created for panel order CBC/DIFF.  Procedure                               Abnormality         Status                     ---------                               -----------         ------                     CBC WITH BWIO[[id number]]                                    Final result                 Please view results for these tests on the individual orders.   CBC WITH DIFF   GRAY TOP TUBE   EXTRA TUBES    Narrative:     The following orders were created for panel order EXTRA TUBES.  Procedure  Abnormality         Status                     ---------                               -----------         ------                     GOLD TOP GGEZ[[id number]]                                    In process                 GRAY TOP YTKP[[id number]]                                    Final result                 Please view results for these tests on the individual orders.   GOLD TOP TUBE       ED Course:  Appropriate labs and imaging ordered. Medical Records reviewed.   During the patient's stay in the emergency department, the above listed imaging and/or  labs were performed to assist with medical decision making and were reviewed by myself when available for review.    Pt remained stable throughout the emergency department course.    Patient was seen and evaluated.  Labs, EKG chest x-ray and CT of the brain were ordered.  CT of the brain was negative for any acute abnormalities.  Chest x-ray was also negative for any acute findings.  EKG is a paced rhythm.  Patient's blood pressures were 517 systolic and above with multiple readings.  She was given labetalol 20 mg IV bolus x2 doses.  Her pressure did come down to the 180s and then went back up to around 200.  She was given Vasotec 1.25 mg IV and her blood pressure came down into the 170s.  I did discuss admission with the patient however she did not want to stay in the hospital and preferred to go home.  Her headache resolved and she feels fine otherwise.  She has been seeing her physician in the office because of her uncontrolled blood pressure and would prefer to go home and follow up with her this week as scheduled.  I will put her on clonidine at home to take as needed if her blood pressures above 180/90.  She is to return if she has any worsening of her symptoms or blood pressure or she has any other problems.  Otherwise she is to follow-up with her PCP.  If she does have any problems she may call and discussed with her PCP.      Impression:    Encounter Diagnoses   Name Primary?   Marland Kitchen Uncontrolled hypertension Yes   . Headache       Disposition:  Discharged     Follow Up Plan:  Follow-up with PCP or return to the emergency room with any worsening symptoms.

## 2021-03-02 NOTE — ED Nurses Note (Signed)
Patient reports talking to provider about discharge plan of care. Declines assistance to vehicle. No concerns. Verbalizes understanding of discharge instructions.

## 2021-03-03 ENCOUNTER — Ambulatory Visit (INDEPENDENT_AMBULATORY_CARE_PROVIDER_SITE_OTHER): Payer: Medicare Other | Admitting: Internal Medicine

## 2021-03-06 ENCOUNTER — Encounter (INDEPENDENT_AMBULATORY_CARE_PROVIDER_SITE_OTHER): Payer: Self-pay | Admitting: Internal Medicine

## 2021-03-10 ENCOUNTER — Ambulatory Visit: Payer: Medicare Other | Attending: Internal Medicine | Admitting: Internal Medicine

## 2021-03-10 ENCOUNTER — Encounter (INDEPENDENT_AMBULATORY_CARE_PROVIDER_SITE_OTHER): Payer: Self-pay | Admitting: Internal Medicine

## 2021-03-10 ENCOUNTER — Other Ambulatory Visit: Payer: Self-pay

## 2021-03-10 VITALS — BP 154/93 | HR 70 | Resp 18 | Ht 66.0 in | Wt 106.6 lb

## 2021-03-10 DIAGNOSIS — I10 Essential (primary) hypertension: Secondary | ICD-10-CM

## 2021-03-10 DIAGNOSIS — Z23 Encounter for immunization: Secondary | ICD-10-CM

## 2021-03-10 DIAGNOSIS — R296 Repeated falls: Secondary | ICD-10-CM

## 2021-03-10 DIAGNOSIS — Z0000 Encounter for general adult medical examination without abnormal findings: Secondary | ICD-10-CM

## 2021-03-10 DIAGNOSIS — N390 Urinary tract infection, site not specified: Secondary | ICD-10-CM | POA: Insufficient documentation

## 2021-03-10 DIAGNOSIS — Z79899 Other long term (current) drug therapy: Secondary | ICD-10-CM | POA: Insufficient documentation

## 2021-03-10 MED ORDER — DEXLANSOPRAZOLE 60 MG CAPSULE,BIPHASE DELAYED RELEASE
60.0000 mg | DELAYED_RELEASE_CAPSULE | Freq: Every day | ORAL | 3 refills | Status: AC
Start: 2021-03-10 — End: 2021-06-08

## 2021-03-10 NOTE — Nursing Note (Signed)
03/10/21 0900   Physical Activity   On average, how many days per week do you engage in moderate to strenuous exercise (like a brisk walk)? 0 days   On average, how many minutes do you engage in exercise at this level? 0 min   Financial Resource Strain   How hard is it for you to pay for the very basics like food, housing, medical care, and heating? Not hard   Housing Stability   In the last 12 months, was there a time when you were not able to pay the mortgage or rent on time? N   In the last 12 months, was there a time when you did not have a steady place to sleep or slept in a shelter (including now)? N   Transportation Needs   In the past 12 months, has lack of transportation kept you from medical appointments or from getting medications? no   In the past 12 months, has lack of transportation kept you from meetings, work, or from getting things needed for daily living? No   Food Insecurity   Within the past 12 months, you worried that your food would run out before you got the money to buy more. Never true   Within the past 12 months, the food you bought just didn't last and you didn't have money to get more. Never true   Stress   Do you feel stress - tense, restless, nervous, or anxious, or unable to sleep at night because your mind is troubled all the time - these days? Not at all   Intimate Partner Violence   Within the last year, have you been afraid of your partner or ex-partner? No   Within the last year, have you been humiliated or emotionally abused in other ways by your partner or ex-partner? No   Within the last year, have you been kicked, hit, slapped, or otherwise physically hurt by your partner or ex-partner? No   Within the last year, have you been raped or forced to have any kind of sexual activity by your partner or ex-partner? No   Alcohol Use   How often do you have a drink containing alcohol? Never

## 2021-03-10 NOTE — Patient Instructions (Signed)
Medicare Preventive Services  Medicare coverage information Recommendation for YOU   Heart Disease and Diabetes   Lipid profile Every 5 years or more often if at risk for cardiovascular disease  Last Lipid Panel  (Last result in the past 2 years)      Cholesterol   HDL   LDL   Direct LDL   Triglycerides      09/25/20 0758 137   76   31     149           Diabetes Screening  yearly for those at risk for diabetes, 2 tests per year for those with prediabetes Last Glucose: 134    Diabetes Self Management Training or Medical Nutrition Therapy  For those with diabetes, up to 10 hrs initial training within a year, subsequent years up to 2 hrs of follow up training Optional for those with diabetes     Medical Nutrition Therapy Three hours of one-on-one counseling in first year, two hours in subsequent years Optional for those with diabetes, kidney disease   Intensive Behavioral Therapy for Obesity  Face-to-face counseling, first month every week, month 2-6 every other week, month 7-12 every month if continued progress is documented Optional for those with Body Mass Index 30 or higher  Your Body mass index is 17.21 kg/m.   Tobacco Cessation (Quitting) Counseling   Two attempts per year, max 4 sessions per attempt, up to 8 per year, for those with tobacco-related health condition Optional for those that use tobacco   Cancer Screening   Colorectal screening   For anyone age 17 to 31 or any age if high risk:  . Screening Colonoscopy every 10 yrs if low risk,  more frequent if higher risk  OR  . Cologuard Stool DNA test once every 3 years OR  . Fecal Occult Blood Testing yearly OR  . Flexible  Sigmoidoscopy  every 5 yr OR  . CT Colonography every 5 yrs    See your schedule below   Screening Pap Test Recommended every 3 years for all women age 59 to 32, or every five years if combined with HPV test (routine screening not needed after total hysterectomy).  Medicare covers every 2 years, up to yearly if high risk.  Screening Pelvic  Exam Medicare covers every 2 years, yearly if high risk or childbearing age with abnormal Pap in last 3 yrs. See your schedule below   Screening Mammogram   Recommended every 2 years for women age 42 to 80, or more frequent if you have a higher risk. Selectively recommended for women between 40-49 based on shared decisions about risk. Covered by Medicare up to every year for women age 38 or older See your schedule below   Lung Cancer Screening  Annual low dose computed tomography (LDCT scan) is recommended for those age 60-77 who smoked 20 pack-years and are current smokers or quit smoking within past 15 years (one pack-year= smoking one PPD for one year), after counseling by your doctor or nurse clinician about the possible benefits or harms. See your schedule below   Vaccinations   Pneumococcal Vaccine: Recommended routinely age 38+ with one or two separate vaccines based on your risk    Recommended before age 59 if medical conditions with increased risk  Seasonal Influenza Vaccine: Once every flu season   Hepatitis B Vaccine: 3 doses if risk (including anyone with diabetes or liver disease)  Shingles Vaccine: Two doses at age 66 or older  Diphtheria Tetanus  Pertussis Vaccine: ONCE as adult, booster every 10 years     Immunization History   Administered Date(s) Administered   . Covid-19 Vaccine,Moderna,12 Years+ 09/17/2019, 10/15/2019, 06/20/2020   . High-Dose Influenza Vaccine, 65+ 04/22/2019   . Influenza Vaccine, 6 month-adult 05/10/2008   . Influenza Vaccine, 65+ 05/11/2020   . PREVNAR 13 (ADMIN) 06/11/2016     Shingles vaccine and Diphtheria Tetanus Pertussis vaccines are available at pharmacies or local health department without a prescription.   Other Screening   Bone Densitometry   Every 24 months for anyone at risk, including postmenopausal       Glaucoma Screening   Yearly if in high risk group such as diabetes, family history, African American age 66+ or Hispanic American age 6+      Hepatitis C  Screening recommended ONCE for those born between 1945-1965, or high risk for HCV infection       HIV Testing recommended routinely at least ONCE, covered every year for age 30 to 79 regardless of risk, and every year for age over 24 who ask for the test or higher risk  Yearly or up to 3 times in pregnancy         Abdominal Aortic Aneurysm Screening Ultrasound   Once between the age of 12-75 with a family history of AAA       Your Personalized Schedule for Preventive Tests   Health Maintenance: Pending and Last Completed       Date Due Completion Date    Diabetic Retinal Exam Never done ---    Pneumococcal Vaccination, Age 63+ (2 - PPSV23 or PCV20) 06/11/2017 06/11/2016    Covid-19 Vaccine (4 - Booster for Moderna series) 10/18/2020 06/20/2020    Adult Tdap-Td (1 - Tdap) 07/20[birth year]2021 (Originally 08/04/1954) ---    Annual Wellness Visit 08/11/2021 (Originally 02/23/2019) ---    Shingles Vaccine (1 of 2) 08/11/2021 (Originally 08/04/1954) ---    Influenza Vaccine (1) 04/13/2021 05/11/2020    Depression Screening 03/10/2022 03/10/2021    Osteoporosis screening 08/24/2030 08/24/2020

## 2021-03-10 NOTE — Nursing Note (Signed)
Immunization administered     Name Date Dose VIS Date Route    Pneumovax 03/10/2021 0.5 mL 06/11/2018 Intramuscular    Site: Left deltoid    Given By: Celedonio Savage, LPN    Manufacturer: [employer]    Lot: [id number]    NDC: [id number]

## 2021-03-10 NOTE — Progress Notes (Unsigned)
[facility]  PEDIATRICS/INTERNAL MEDICINE, [facility]  [address]  [place] [address]  [phone]  03/14/2021     Medicare Annual Wellness Visit    Name: Theresa Greene MRN:  [id number]   Date: 03/10/2021 Age: 86 y.o.       SUBJECTIVE:   Theresa Greene is a 86 y.o. female presenting for Medicare Wellness examination. We are also going to recheck her blood pressure today and see how it is running and decide if adjustment of her medication is needed. We briefly discussed her diet. According the her daughters, she is very picky and doesn't eat regularly. She says that she drinks about 4 16oz glasses of water every day. She usually has 1 cup of coffee. She will sometimes drink a diet Sprite as well depending on how her blood pressure is running and how she feels.     I have reviewed and reconciled the medication list with the patient today.    I have reviewed and updated as appropriate the past medical, family and social history. 03/10/2021 as summarized below:  Past Medical History:   Diagnosis Date   . CAD S/P percutaneous coronary angioplasty 08/06/2014    Formatting of this note might be different from the original. Pt reports 5 years ago   . Charcot's joint of foot, left 08/06/2014   . Gastroesophageal reflux disease 08/06/2014   . History of cardiac pacemaker 08/06/2014   . History of CVA (cerebrovascular accident) 08/06/2014    Formatting of this note might be different from the original. 1995, no residual deficits   . HTN (hypertension)    . Hyperlipidemia 12/25/2019   . Neuropathy (CMS HCC)    . Paroxysmal atrial fibrillation (CMS HCC) 08/06/2014   . PUD (peptic ulcer disease) 08/06/2014    Formatting of this note might be different from the original. C/b GIB Spring 2015   . Rheumatoid arthritis (CMS HCC) 08/06/2014   . Stage 3 chronic kidney disease (CMS HCC) 12/25/2019    Last Assessment & Plan:  Formatting of this note might be  different from the original. Renal function stable, at baseline Stable electrolytes; restart sodium bicarb 650 mg BID  Continue renal protective ARB Discussed CKD risk factor modifications, including tight BP/glucose control, low protein diet, and avoidance of NSAIDs   . Type 2 diabetes mellitus (CMS HCC) 08/06/2014   . Vitamin D deficiency 12/25/2019     Past Surgical History:   Procedure Laterality Date   . Coronary artery angioplasty  2011   . Hx back surgery     . Hx hip replacement Right    . Hx nissen fundoplication  2002   . Hx open cholecystectomy  1962   . Hx pacemaker insertion  2019   . Knee arthroplasty Right    . Pancreatic pseudocyst drainage  1971   . Total abdominal hysterectomy       Current Outpatient Medications   Medication Sig   . amiodarone (PACERONE) 200 mg Oral Tablet Take 1 Tablet (200 mg total) by mouth Once a day for 90 days   . amLODIPine (NORVASC) 5 mg Oral Tablet Take 1 Tablet (5 mg total) by mouth Once a day   . atorvastatin (LIPITOR) 20 mg Oral Tablet Take 1 Tablet (20 mg total) by mouth Once a day for 90 days   . clopidogreL (PLAVIX) 75 mg Oral Tablet Take 1 Tablet (75 mg total) by mouth Once a day  for 90 days   . CREON 36,000-114,000- 180,000 unit Oral Capsule, Delayed Release(E.C.)    . Dexlansoprazole (DEXILANT) 60 mg Oral Cap, Delayed Rel., Multiphasic Take 1 Capsule (60 mg total) by mouth Once a day for 90 days   . erythromycin (ROMYCIN) 5 mg/gram (0.5 %) Ophthalmic Ointment    . ezetimibe (ZETIA) 10 mg Oral Tablet Take 1 Tablet (10 mg total) by mouth Once a day for 90 days   . gabapentin (NEURONTIN) 100 mg Oral Capsule Take 1 Capsule (100 mg total) by mouth Twice daily for 90 days   . gemfibroziL (LOPID) 600 mg Oral Tablet Take 1 Tablet (600 mg total) by mouth Twice a day before meals for 90 days   . losartan (COZAAR) 50 mg Oral Tablet Take 0.5 Tablets (25 mg total) by mouth Twice daily for 90 days (Patient taking differently: Take 50 mg by mouth Once a day)   . metoprolol  succinate (TOPROL-XL) 100 mg Oral Tablet Sustained Release 24 hr Take 1 Tablet (100 mg total) by mouth Once a day   . sodium bicarbonate 650 mg Oral Tablet Take 1 Tablet (650 mg total) by mouth Twice daily     Family Medical History:     Problem Relation (Age of Onset)    Ovarian Cancer Mother    Pancreatic Cancer Father          Social History     Tobacco Use   . Smoking status: Never Smoker   . Smokeless tobacco: Never Used   Substance Use Topics   . Alcohol use: Never      Social History     Substance and Sexual Activity   Drug Use Never      Social History     Social History Narrative    Lives at home with husband. 2 grown daughters - an Charity fundraiser and a Mining engineer. She did Building control surveyor for 23 years.         REVIEW OF SYSTEMS:   All systems have been reviewed and found to be negative except for as stated above in the history of present illness.      List of Current Health Care Providers   Care Team     PCP     Name Type Specialty Phone Number    [facility], [place], [place] [facility] INTERNAL MEDICINE [phone]          Care Team     No care team found                  Health Maintenance   Topic Date Due   . Diabetic Retinal Exam  Never done   . Covid-19 Vaccine (4 - Booster for Moderna series) 10/18/2020   . Adult Tdap-Td (1 - Tdap) 07/20/2021 (Originally 08/04/1954)   . Shingles Vaccine (1 of 2) 08/11/2021 (Originally 08/04/1954)   . Influenza Vaccine (1) 04/13/2021   . Depression Screening  03/10/2022   . Annual Wellness Visit  03/10/2022   . Osteoporosis screening  08/24/2030   . Pneumococcal Vaccination, Age 66+  Completed   . Meningococcal Vaccine  Aged Out   . Diabetes A1C  Discontinued     Medicare Wellness Assessment   Medicare initial or wellness physical in the last year?: Yes  Advance Directives (optional)   Does patient have a living will or MPOA: YES   Has patient provided [employer] with a copy?: no   Advance directive information given to the patient today?: no  Activities of Daily  Living   Do you need help with dressing, bathing, or walking?: Yes   Do you need help with shopping, housekeeping, medications, or finances?: Yes   Do you have rugs in hallways, broken steps, or poor lighting?: No   Do you have grab bars in your bathroom, non-slip strips in your tub, and hand rails on your stairs?: Yes   Urinary Incontinence Screen (Women >=65 only)   Do you ever leak urine when you don't want to?: YES (SOME NIGHTS)   Cognitive Function Screen (1=Yes, 0=No)   What is you age?: Correct   What is the time to the nearest hour?: Correct   What is the year?: Correct   What is the name of this clinic?: Correct   Can the patient recognize two persons (the doctor, the nurse, home help, etc.)?: Correct   What is the date of your birth? (day and month sufficient) : Correct   In what year did World War II end?: Correct   Who is the current president of the [place]?: Correct   Count from 20 down to 1?: Correct   What address did I give you earlier?: Correct   Total Score: 10   Interpretation of Total Score: Greater than 6 Normal   Hearing Screen   Have you noticed any hearing difficulties?: No  After whispering 9-1-6 how many numbers did the patient repeat correctly?: 2  After whispering 4-7-8 how many numbers did the patient repeat correctly?: 3   Fall Risk Screen   Do you feel unsteady when standing or walking?: Yes  Do you worry about falling?: Yes  Have you fallen in the past year?: No   Vision Screen   Right Eye = 20: 25   Left Eye = 20: 25   Depression Screen     Little interest or pleasure in doing things.: Not at all  Feeling down, depressed, or hopeless: Not at all  PHQ 2 Total: 0        Comprehensive Health Assessment-Adult 03/10/2021   Do you wish to complete this form? Yes   During the past 4 weeks, how would you rate your health in general? Fair   During the past 4 weeks, how much difficulty have you had doing your usual activities inside and outside your home because of medical or emotional  problems? A little bit of difficulty   During the past 4 weeks, was someone available to help you if you needed and wanted help? Yes, as much as I wanted   In the past year, how many times have you gone to the emergency department or been admitted to a hospital for a health problem? 1 time   During the past 4 weeks, how much bodily pain have you typically had? Moderate pain   Do you currently use opioid pain medications, take regularly or keep on hand for use as needed (medication such as hydrocodone, oxycodone, codeine or morphine)? No   Are you generally satisfied with your sleep? Yes   Do you have enough money to buy things you need in everyday life, such as food, clothing, medicines, and housing? Yes, always   Can you get to places beyond walking distance without help?  (For example, can you drive your own car or travel alone on buses)? No   Do you fasten your seatbelt when you are in a car? Yes, usually   Do you exercise 20 minutes 3 or more days per week (such as  walking, dancing, biking, mowing grass, swimming)? Yes, some of the time   How often do you eat food that is healthy (fruits, vegetables, lean meats) instead of unhealthy (sweets, fast food, junk food, fatty foods)? Almost always   How often do you have trouble taking medicines the eay you are told to take them? I always take them as prescribed   Do you need any help communicating with your doctors and nurses because of vision or hearing problems? No   During the past 12 months, have you experienced confusion or memory loss that is happening more often or is getting worse? No   Are you now also seeing any specialist physician(s) (such as eye doctor, foot doctor, skin doctor)? Yes   If you are seeing a specialist for anything such as foot, eye, skin, etc.  please list their name(s) Dr. Darron Doom for heart   How confident are you that you can control or manage most of your health problems? Very confident            Social Determinants of Health     Tobacco  Use: Low Risk    . Smoking Tobacco Use: Never Smoker   . Smokeless Tobacco Use: Never Used   Alcohol Use: Not At Risk   . Frequency of Alcohol Consumption: Never   . Average Number of Drinks: Not on file   . Frequency of Binge Drinking: Not on file   Financial Resource Strain: Low Risk    . Difficulty of Paying Living Expenses: Not hard at all   Food Insecurity: No Food Insecurity   . Worried About Programme researcher, broadcasting/film/video in the Last Year: Never true   . Ran Out of Food in the Last Year: Never true   Transportation Needs: No Transportation Needs   . Lack of Transportation (Medical): No   . Lack of Transportation (Non-Medical): No   Physical Activity: Inactive   . Days of Exercise per Week: 0 days   . Minutes of Exercise per Session: 0 min   Stress: No Stress Concern Present   . Feeling of Stress : Not at all   [place] Social Connections: Not on file   Intimate Partner Violence: Not At Risk   . Fear of Current or Ex-Partner: No   . Emotionally Abused: No   . Physically Abused: No   . Sexually Abused: No   Depression: Not at risk   . PHQ-2 Score: 0   Housing Stability: Unknown   . Unable to Pay for Housing in the Last Year: No   . Number of Places Lived in the Last Year: Not on file   . Unstable Housing in the Last Year: No        OBJECTIVE:   BP (!) 154/93 (Site: Upper Extremity, Patient Position: Sitting, Cuff Size: Adult)   Pulse 70   Resp 18   Ht 1.676 m (5\' 6" )   Wt 48.4 kg (106 lb 9.6 oz)   SpO2 97%   BMI 17.21 kg/m     General:  No apparent distress.  Family at bedside.  Interactive.  Frail.   HEENT:  Atraumatic, normocephalic.  Nares patent without lesion or discharge. Mucous membranes moist. Tongue protrudes midline. No tonsillar erythema or exudates. Tympanic membranes are pearly grey with normal light reflex. No erythema or buldging of the TM. No effusion present.  Neck:  Supple. Trachea is midline. No adenopathy. No thyromegaly. No carotid bruits.  Respiratory: No respiratory distress. Lungs clear to  auscultation bilaterally. No wheezes, rales, or rhonchi.  Cardiovascular: Regular rate and rhythm. S1, S2 present. No murmurs, rubs, or gallops. Pulses are +2 and equal in all extremities.  Abdomen:  Soft and non-distended.  Non-tender. Bowel sounds present and normoactive in all quadrants. No hepatosplenomegaly. No guarding or rebound tenderness.  Skin: Pink and warm. No rashes or petechiae noted.  Extremities:  No clubbing, cyanosis or edema.     Health Maintenance Due   Topic Date Due   . Diabetic Retinal Exam  Never done   . Covid-19 Vaccine (4 - Booster for Moderna series) 10/18/2020          ASSESSMENT & PLAN:   1. Encounter for annual wellness visit (AWV) in Medicare patient    2. Primary hypertension    3. Need for vaccination    4. Frequent falls       Identified Risk Factors/ Recommended Actions     Fall Risk Follow up plan of care: Refer to PT to improve balance, strength, and gait training.  Discussed optimizing home safety  Manage & monitor hypotension  Medications managed to minimize fall risk    Urinary Incontinence Plan of Care: Behavioral interventions with bladder training, pelvic floor muscle training, and/or prompted voiding  She says that she wears diapers and does fine with this.   Orders Placed This Encounter   . Pneumovax (Admin)   . Refer to External Provider   . Dexlansoprazole (DEXILANT) 60 mg Oral Cap, Delayed Rel., Multiphasic          She doesn't seem to have had any further low blood pressures. I am assuming that she had probably gotten the medication confused and took too much when this happened. I have discussed with her daughters that 140-150 systolic is a good goal for her due to her issues with dipping low at times. We did discuss that when she is having low blood pressure and dizziness, she needs to try and drink something with electrolytes in it, such as Gatorade. Continue on Toprol XL 100 mg oral daily, losartan 50 mg oral daily, and amlodipine 5mg  oral daily for now. I will  see her back next week and readjust if needed.     Immunize with Pneumovax vaccine today.     I will contact her Home Health Agency and request that PT/OT be added in addition to her skilled nursing for medication management.    Her urine culture great out >100,000cfu of E. Coli. She has completed treatment with Omnicef 300 mg oral twice daily for 7 days, which it was sensitive to. She says that her symptoms have improved.     The patient has been educated about risk factors and recommended preventive care. Written Prevention Plan completed, updated, and given to patient.   Medicare Preventive:   Medicare Preventive Services  Medicare coverage information Recommendation for YOU   Heart Disease and Diabetes   Lipid profile Every 5 years or more often if at risk for cardiovascular disease  Last Lipid Panel  (Last result in the past 2 years)      Cholesterol   HDL   LDL   Direct LDL   Triglycerides      09/25/20 0758 137   76   31     149           Diabetes Screening  yearly for those at risk for diabetes, 2 tests per year for those with prediabetes Last Glucose: 134    Diabetes Self  Management Training or Medical Nutrition Therapy  For those with diabetes, up to 10 hrs initial training within a year, subsequent years up to 2 hrs of follow up training Optional for those with diabetes     Medical Nutrition Therapy Three hours of one-on-one counseling in first year, two hours in subsequent years Optional for those with diabetes, kidney disease   Intensive Behavioral Therapy for Obesity  Face-to-face counseling, first month every week, month 2-6 every other week, month 7-12 every month if continued progress is documented Optional for those with Body Mass Index 30 or higher  Your Body mass index is 17.21 kg/m.   Tobacco Cessation (Quitting) Counseling   Two attempts per year, max 4 sessions per attempt, up to 8 per year, for those with tobacco-related health condition Optional for those that use tobacco   Cancer Screening    Colorectal screening   For anyone age 72 to 80 or any age if high risk:  . Screening Colonoscopy every 10 yrs if low risk,  more frequent if higher risk  OR  . Cologuard Stool DNA test once every 3 years OR  . Fecal Occult Blood Testing yearly OR  . Flexible  Sigmoidoscopy  every 5 yr OR  . CT Colonography every 5 yrs    See your schedule below   Screening Pap Test Recommended every 3 years for all women age 2 to 30, or every five years if combined with HPV test (routine screening not needed after total hysterectomy).  Medicare covers every 2 years, up to yearly if high risk.  Screening Pelvic Exam Medicare covers every 2 years, yearly if high risk or childbearing age with abnormal Pap in last 3 yrs. See your schedule below   Screening Mammogram   Recommended every 2 years for women age 54 to 45, or more frequent if you have a higher risk. Selectively recommended for women between 40-49 based on shared decisions about risk. Covered by Medicare up to every year for women age 66 or older See your schedule below   Lung Cancer Screening  Annual low dose computed tomography (LDCT scan) is recommended for those age 66-77 who smoked 20 pack-years and are current smokers or quit smoking within past 15 years (one pack-year= smoking one PPD for one year), after counseling by your doctor or nurse clinician about the possible benefits or harms. See your schedule below   Vaccinations   Pneumococcal Vaccine: Recommended routinely age 71+ with one or two separate vaccines based on your risk    Recommended before age 60 if medical conditions with increased risk  Seasonal Influenza Vaccine: Once every flu season   Hepatitis B Vaccine: 3 doses if risk (including anyone with diabetes or liver disease)  Shingles Vaccine: Two doses at age 73 or older  Diphtheria Tetanus Pertussis Vaccine: ONCE as adult, booster every 10 years     Immunization History   Administered Date(s) Administered   . Covid-19 Vaccine,Moderna,12 Years+  09/17/2019, 10/15/2019, 06/20/2020   . High-Dose Influenza Vaccine, 65+ 04/22/2019   . Influenza Vaccine, 6 month-adult 05/10/2008   . Influenza Vaccine, 65+ 05/11/2020   . PREVNAR 13 (ADMIN) 06/11/2016   . Pneumovax 03/10/2021     Shingles vaccine and Diphtheria Tetanus Pertussis vaccines are available at pharmacies or local health department without a prescription.   Other Screening   Bone Densitometry   Every 24 months for anyone at risk, including postmenopausal       Glaucoma Screening   Yearly if  in high risk group such as diabetes, family history, African American age 76+ or Hispanic American age 54+      Hepatitis C Screening recommended ONCE for those born between 1945-1965, or high risk for HCV infection       HIV Testing recommended routinely at least ONCE, covered every year for age 60 to 27 regardless of risk, and every year for age over 41 who ask for the test or higher risk  Yearly or up to 3 times in pregnancy         Abdominal Aortic Aneurysm Screening Ultrasound   Once between the age of 31-75 with a family history of AAA       Your Personalized Schedule for Preventive Tests   Health Maintenance: Pending and Last Completed       Date Due Completion Date    Diabetic Retinal Exam Never done ---    Covid-19 Vaccine (4 - Booster for Moderna series) 10/18/2020 06/20/2020    Adult Tdap-Td (1 - Tdap) 07/20/2021 (Originally 08/04/1954) ---    Shingles Vaccine (1 of 2) 08/11/2021 (Originally 08/04/1954) ---    Influenza Vaccine (1) 04/13/2021 05/11/2020    Depression Screening 03/10/2022 03/10/2021    Annual Wellness Visit 03/10/2022 03/10/2021    Osteoporosis screening 08/24/2030 08/24/2020             Return in about 1 week (around 03/17/2021) for Blood pressure check.    Jadarian Mckay Hill-Reinert, DO      This note was partially generated using MModal Fluency Direct system, and there may be some incorrect words, spellings, and punctuation that were not noted in checking the note before saving.

## 2021-03-10 NOTE — Nursing Note (Signed)
03/10/21 0904   Medicare Wellness Assessment   Medicare initial or wellness physical in the last year? Yes   Advance Directives   Does patient have a living will or MPOA YES   Has patient provided [employer] with a copy? no   Advance directive information given to the patient today? no   Activities of Daily Living   Do you need help with dressing, bathing, or walking? Yes   Do you need help with shopping, housekeeping, medications, or finances? Yes   Do you have rugs in hallways, broken steps, or poor lighting? No   Do you have grab bars in your bathroom, non-slip strips in your tub, and hand rails on your stairs? Yes   Urinary Incontinence Screen-Women only   Do you ever leak urine when you don't want to? YES  (SOME NIGHTS)   Cognitive Function Screen   What is you age? 1   What is the time to the nearest hour? 1   What is the year? 1   What is the name of this clinic? 1   Can the patient recognize two persons (the doctor, the nurse, home help, etc.)? 1   What is the date of your birth? (day and month sufficient)  1   In what year did World War II end? 1   Who is the current president of the [place]? 1   Count from 20 down to 1? 1   What address did I give you earlier? 1   Total Score 10   Interpretation of Total Score Greater than 6 Normal   Depression Screen   Little interest or pleasure in doing things. 0   Feeling down, depressed, or hopeless 0   PHQ 2 Total 0   Hearing Screen   Have you noticed any hearing difficulties? No   After whispering 9-1-6 how many numbers did the patient repeat correctly? 2   After whispering 4-7-8 how many numbers did the patient repeat correctly? 3   Total Correct 5   Fall Risk Assessment   Do you feel unsteady when standing or walking? Yes   Do you worry about falling? Yes   Have you fallen in the past year? No   Vision Screen   Right Eye = 20 25   Left Eye = 20 25

## 2021-03-10 NOTE — Nursing Note (Signed)
03/10/21 0900   Comprehensive Health Assessment-Adult   Do you wish to complete this form? Yes   During the past 4 weeks, how would you rate your health in general? Fair   During the past 4 weeks, how much difficulty have you had doing your usual activities inside and outside your home because of medical or emotional problems? A little bit of difficulty   During the past 4 weeks, was someone available to help you if you needed and wanted help? Yes, as much as I wanted   In the past year, how many times have you gone to the emergency department or been admitted to a hospital for a health problem? 1 time   During the past 4 weeks, how much bodily pain have you typically had? Moderate pain   Do you currently use opioid pain medications, take regularly or keep on hand for use as needed (medication such as hydrocodone, oxycodone, codeine or morphine)? No   Are you generally satisfied with your sleep? Yes   Do you have enough money to buy things you need in everyday life, such as food, clothing, medicines, and housing? Yes, always   Can you get to places beyond walking distance without help?  (For example, can you drive your own car or travel alone on buses)? No   Do you fasten your seatbelt when you are in a car? Yes, usually   Do you exercise 20 minutes 3 or more days per week (such as walking, dancing, biking, mowing grass, swimming)? Yes, some of the time   How often do you eat food that is healthy (fruits, vegetables, lean meats) instead of unhealthy (sweets, fast food, junk food, fatty foods)? Almost always   How often do you have trouble taking medicines the eay you are told to take them? I always take them as prescribed   Do you need any help communicating with your doctors and nurses because of vision or hearing problems? No   During the past 12 months, have you experienced confusion or memory loss that is happening more often or is getting worse? No   Are you now also seeing any specialist physician(s) (such  as eye doctor, foot doctor, skin doctor)? Yes   If you are seeing a specialist for anything such as foot, eye, skin, etc.  please list their name(s) Dr. Darron Doom for heart   How confident are you that you can control or manage most of your health problems? Very confident

## 2021-03-10 NOTE — Nursing Note (Signed)
Body mass index is 17.21 kg/m.    Fall Risk Assessment  Do you feel unsteady when standing or walking?: Yes  Do you worry about falling?: Yes  Have you fallen in the past year?: No    PHQ Questionnaire  Little interest or pleasure in doing things.: Not at all  Feeling down, depressed, or hopeless: Not at all  PHQ 2 Total: 0         Travel Screening     Question   Response    In the last 10 days, have you been in contact with someone who was confirmed or suspected to have Coronavirus/COVID-19?  No / Unsure    Have you had a COVID-19 viral test in the last 10 days?  No    Do you have any of the following new or worsening symptoms?  None of these    Have you traveled internationally or domestically in the last month?  No      Travel History   Travel since 02/08/21    No documented travel since 02/08/21       Functional Health Screening:          Celedonio Savage, LPN  4/70/9628, 09:14

## 2021-03-16 NOTE — Progress Notes (Unsigned)
[facility]  PEDIATRICS/INTERNAL MEDICINE, [facility]  [address]  [place] [address]  [phone]  03/17/2021       Name:  Theresa Greene MRN: [id number]   Date:    03/17/2021 Age:  86 y.o.       Reason for Visit:  Chief Complaint   Patient presents with   . Blood Pressure Check     Sitting 116/72 HR: 75  Standing 102/62 HR 95 Laying 144/79 HR: 81         Subjective:   Theresa Greene is a 86 y.o. female who comes in today for follow up on blood pressure. She has her daughter, [name] with her today. She has a record of her blood pressure over the last week. She has been running anywhere from 130-150's systolic with about on outlier of 180's. She has not had any bad headaches and her dizziness is resolved. Her daughter says that she checks her blood pressure with a wrist machine at home because it is easier for her to use. She says that she still hasn't been able to go to church because she is afraid of COVID and some of the people there have not had their vaccines.     Past Medical History:    Past Medical History:   Diagnosis Date   . CAD S/P percutaneous coronary angioplasty 08/06/2014    Pt reports 5 years ago   . Charcot's joint of foot, left 08/06/2014   . Gastroesophageal reflux disease 08/06/2014   . History of cardiac pacemaker 08/06/2014   . History of CVA (cerebrovascular accident) 08/06/2014    1995, no residual deficits   . HTN (hypertension)    . Hyperlipidemia 12/25/2019   . Neuropathy (CMS HCC)    . Paroxysmal atrial fibrillation (CMS HCC) 08/06/2014   . PUD (peptic ulcer disease) 08/06/2014    C/b GIB Spring 2015   . Rheumatoid arthritis (CMS HCC) 08/06/2014   . Stage 3 chronic kidney disease (CMS HCC) 12/25/2019    Last Assessment & Plan:  Renal function stable, at baseline Stable electrolytes; restart sodium bicarb 650 mg BID  Continue renal protective ARB Discussed CKD risk factor modifications, including tight BP/glucose  control, low protein diet, and avoidance of NSAIDs   . Type 2 diabetes mellitus (CMS HCC) 08/06/2014   . Vitamin D deficiency 12/25/2019      Past Surgical History:    Past Surgical History:   Procedure Laterality Date   . CORONARY ARTERY ANGIOPLASTY  2011   . HX BACK SURGERY      lower back x 3   . HX HIP REPLACEMENT Right    . HX NISSEN FUNDOPLICATION  2002    then three others to repair problems   . HX OPEN CHOLECYSTECTOMY  1962    with common duxt exploration   . HX PACEMAKER INSERTION  2019   . KNEE ARTHROPLASTY Right    . OPEN REDUCTION INTERNAL FIXATION FRACTURE FEMUR Right 05/07/2020    Performed by Jeanell Sparrow, MD at [facility] OR [facility]   . PANCREATIC PSEUDOCYST DRAINAGE  1971   . TOTAL ABDOMINAL HYSTERECTOMY       Family History:    Family Medical History:     Problem Relation (Age of Onset)    Ovarian Cancer Mother    Pancreatic Cancer Father           Social History:   Social History  Tobacco Use   . Smoking status: Never Smoker   . Smokeless tobacco: Never Used   Substance Use Topics   . Alcohol use: Never      Social History     Substance and Sexual Activity   Drug Use Never      Social History     Social History Narrative    Lives at home with husband. 2 grown daughters - an Charity fundraiser and a Mining engineer. She did Building control surveyor for 23 years.         Allergies:    Allergies   Allergen Reactions   . Iodinated Contrast Media Anaphylaxis   . Etodolac NO Steroids unless approved by Attending Physician   . Iv Contrast      makese her serverly sick   . Codeine  Other Adverse Reaction (Add comment) and Nausea/ Vomiting     Stomach pain  Stomach pain        Home Medications:    Current Outpatient Medications:   .  amiodarone (PACERONE) 200 mg Oral Tablet, Take 1 Tablet (200 mg total) by mouth Once a day for 90 days, Disp: 90 Tablet, Rfl: 3  .  amLODIPine (NORVASC) 5 mg Oral Tablet, Take 1 Tablet (5 mg total) by mouth Once a day, Disp: 90 Tablet, Rfl: 4  .  atorvastatin (LIPITOR) 20 mg Oral Tablet, Take 1  Tablet (20 mg total) by mouth Once a day for 90 days, Disp: 90 Tablet, Rfl: 3  .  calcitrioL (ROCALTROL) 0.25 mcg Oral Capsule, Take 1 Capsule (0.25 mcg total) by mouth Once a day for 90 days, Disp: 90 Capsule, Rfl: 3  .  clopidogreL (PLAVIX) 75 mg Oral Tablet, Take 1 Tablet (75 mg total) by mouth Once a day for 90 days, Disp: 90 Tablet, Rfl: 3  .  CREON 36,000-114,000- 180,000 unit Oral Capsule, Delayed Release(E.C.), , Disp: , Rfl:   .  Dexlansoprazole (DEXILANT) 60 mg Oral Cap, Delayed Rel., Multiphasic, Take 1 Capsule (60 mg total) by mouth Once a day for 90 days, Disp: 90 Capsule, Rfl: 3  .  ergocalciferol, vitamin D2, (DRISDOL) 1,250 mcg (50,000 unit) Oral Capsule, Take 1 Capsule (50,000 Units total) by mouth Every 7 days for 90 days, Disp: 12 Capsule, Rfl: 3  .  erythromycin (ROMYCIN) 5 mg/gram (0.5 %) Ophthalmic Ointment, , Disp: , Rfl:   .  ezetimibe (ZETIA) 10 mg Oral Tablet, Take 1 Tablet (10 mg total) by mouth Once a day for 90 days, Disp: 90 Tablet, Rfl: 3  .  gabapentin (NEURONTIN) 100 mg Oral Capsule, Take 1 Capsule (100 mg total) by mouth Twice daily for 90 days, Disp: 180 Capsule, Rfl: 0  .  gemfibroziL (LOPID) 600 mg Oral Tablet, Take 1 Tablet (600 mg total) by mouth Twice a day before meals for 90 days, Disp: 180 Tablet, Rfl: 3  .  losartan (COZAAR) 50 mg Oral Tablet, Take 0.5 Tablets (25 mg total) by mouth Twice daily for 90 days (Patient taking differently: Take 50 mg by mouth Once a day), Disp: 90 Tablet, Rfl: 3  .  metoprolol succinate (TOPROL-XL) 100 mg Oral Tablet Sustained Release 24 hr, Take 1 Tablet (100 mg total) by mouth Once a day, Disp: 90 Tablet, Rfl: 4  .  sodium bicarbonate 650 mg Oral Tablet, Take 1 Tablet (650 mg total) by mouth Twice daily, Disp: 180 Tablet, Rfl: 3       Review of Systems:   All systems have been reviewed and  found to be negative except for as stated above in the history of present illness.     Physical Examination:   Vitals: BP 116/72   Pulse 75   Resp 18    Wt 48.5 kg (107 lb)   SpO2 95%   BMI 17.27 kg/m     General:  No apparent distress.  Well nourished and well developed.  Family at bedside.  Interactive.  Frail.   Neck:  Supple. Trachea is midline. No JVD. No adenopathy. No thyromegaly.  Respiratory: No respiratory distress. Lungs clear to auscultation bilaterally. No wheezes, rales, or rhonchi.  Cardiovascular: Regular rate and rhythm. S1, S2 present. No murmurs, rubs, or gallops. Pulses are +2 and equal in all extremities.  Extremities:  No clubbing, cyanosis or edema.     Assessment:   (I10) Primary hypertension  (primary encounter diagnosis)     Plan:   Overall, much improved. Continue on current regimen of amlodipine 5mg  oral daily, Toprol XL 100 mg oral daily, and losartan 50 mg oral daily. Advised that I would rather see her running slightly over goal than to have her hypotensive and dizzy. Her daughter is in agreement. I also asked her that if she follows up with Dr. Minus Liberty or Dr. Darron Doom, to have them keep me in the loop or call the office to discuss if they are making changes to her blood pressure medications. She voiced understanding. We will follow up in 1 month to see how she is doing on this regimen.     Patient was counseled on diagnosis, follow-up, and side effect of medications.  Reasons for follow up in clinic or emergency department were discussed.    Orders:  No orders of the defined types were placed in this encounter.     Referrals:   Orders Placed This Encounter   No orders of the following type(s) were placed in this encounter: Outpatient Referral.      Discharge Medication List:       Current Discharge Medication List          Accurate as of March 17, 2021 12:24 PM. If you have any questions, ask your nurse or doctor.            CONTINUE these medications which have CHANGED during your visit.      Details   losartan 50 mg Tablet  Commonly known as: COZAAR  What changed:    how much to take   when to take this   25 mg, Oral, 2 TIMES  DAILY  Qty: 90 Tablet  Refills: 3        CONTINUE these medications - NO CHANGES were made during your visit.      Details   amiodarone 200 mg Tablet  Commonly known as: PACERONE   200 mg, Oral, DAILY  Qty: 90 Tablet  Refills: 3     amLODIPine 5 mg Tablet  Commonly known as: NORVASC   5 mg, Oral, DAILY  Qty: 90 Tablet  Refills: 4     atorvastatin 20 mg Tablet  Commonly known as: LIPITOR   20 mg, Oral, DAILY  Qty: 90 Tablet  Refills: 3     calcitrioL 0.25 mcg Capsule  Commonly known as: ROCALTROL   0.25 mcg, Oral, DAILY  Qty: 90 Capsule  Refills: 3     clopidogreL 75 mg Tablet  Commonly known as: PLAVIX   75 mg, Oral, DAILY  Qty: 90 Tablet  Refills: 3     Creon 36,000-114,000-  180,000 unit Capsule, Delayed Release(E.C.)  Generic drug: lipase-protease-amylase   No dose, route, or frequency recorded.  Refills: 0     Dexlansoprazole 60 mg Cap, Delayed Rel., Multiphasic  Commonly known as: Dexilant   60 mg, Oral, DAILY  Qty: 90 Capsule  Refills: 3     ergocalciferol (vitamin D2) 1,250 mcg (50,000 unit) Capsule  Commonly known as: DRISDOL   50,000 Units, Oral, EVERY 7 DAYS  Qty: 12 Capsule  Refills: 3     erythromycin 5 mg/gram (0.5 %) Ointment  Commonly known as: ROMYCIN   No dose, route, or frequency recorded.  Refills: 0     ezetimibe 10 mg Tablet  Commonly known as: ZETIA   10 mg, Oral, DAILY  Qty: 90 Tablet  Refills: 3     gabapentin 100 mg Capsule  Commonly known as: NEURONTIN   100 mg, Oral, 2 TIMES DAILY  Qty: 180 Capsule  Refills: 0     gemfibroziL 600 mg Tablet  Commonly known as: LOPID   600 mg, Oral, 2 TIMES DAILY BEFORE MEALS  Qty: 180 Tablet  Refills: 3     metoprolol succinate 100 mg Tablet Sustained Release 24 hr  Commonly known as: TOPROL-XL   100 mg, Oral, DAILY  Qty: 90 Tablet  Refills: 4     sodium bicarbonate 650 mg Tablet   650 mg, Oral, 2 TIMES DAILY  Qty: 180 Tablet  Refills: 3               Follow up: Return in about 1 month (around 04/17/2021) for High Blood Pressure. Patient was advised to follow  up sooner if issues arise.     Derryck Shahan Hill-Reinert, DO    This note was partially created using M*Modal fluency direct system (voice recognition software ) and is inherently subject to errors including those of syntax and "sound- alike" substitutions which may escape proofreading.  In such instances, original meaning may be extrapolated by contextual derivation.

## 2021-03-17 ENCOUNTER — Encounter (INDEPENDENT_AMBULATORY_CARE_PROVIDER_SITE_OTHER): Payer: Self-pay | Admitting: Internal Medicine

## 2021-03-17 ENCOUNTER — Other Ambulatory Visit: Payer: Self-pay

## 2021-03-17 ENCOUNTER — Ambulatory Visit: Payer: Medicare Other | Attending: Internal Medicine | Admitting: Internal Medicine

## 2021-03-17 VITALS — BP 116/72 | HR 75 | Resp 18 | Wt 107.0 lb

## 2021-03-17 DIAGNOSIS — I10 Essential (primary) hypertension: Secondary | ICD-10-CM | POA: Insufficient documentation

## 2021-03-17 DIAGNOSIS — Z79899 Other long term (current) drug therapy: Secondary | ICD-10-CM | POA: Insufficient documentation

## 2021-03-17 NOTE — Nursing Note (Signed)
Body mass index is 17.27 kg/m.    Fall Risk Assessment  Do you feel unsteady when standing or walking?: Yes  Do you worry about falling?: Yes  Have you fallen in the past year?: No    PHQ Questionnaire  Little interest or pleasure in doing things.: Not at all  Feeling down, depressed, or hopeless: Not at all  PHQ 2 Total: 0         Travel Screening     Question   Response    In the last 10 days, have you been in contact with someone who was confirmed or suspected to have Coronavirus/COVID-19?  No / Unsure    Have you had a COVID-19 viral test in the last 10 days?  No    Do you have any of the following new or worsening symptoms?  None of these    Have you traveled internationally or domestically in the last month?  No      Travel History   Travel since 02/14/21    No documented travel since 02/14/21       Functional Health Screening:          Celedonio Savage, LPN  11/17/8030, 12:24

## 2021-04-11 ENCOUNTER — Encounter (INDEPENDENT_AMBULATORY_CARE_PROVIDER_SITE_OTHER): Payer: Self-pay | Admitting: Internal Medicine

## 2021-04-18 ENCOUNTER — Encounter (INDEPENDENT_AMBULATORY_CARE_PROVIDER_SITE_OTHER): Payer: Self-pay | Admitting: Internal Medicine

## 2021-04-19 ENCOUNTER — Encounter (INDEPENDENT_AMBULATORY_CARE_PROVIDER_SITE_OTHER): Payer: Self-pay | Admitting: Internal Medicine

## 2021-04-19 ENCOUNTER — Ambulatory Visit: Payer: Medicare Other | Attending: Internal Medicine | Admitting: Internal Medicine

## 2021-04-19 ENCOUNTER — Other Ambulatory Visit: Payer: Self-pay

## 2021-04-19 VITALS — BP 149/90 | HR 70 | Temp 97.2°F | Resp 17 | Wt 107.2 lb

## 2021-04-19 DIAGNOSIS — I10 Essential (primary) hypertension: Secondary | ICD-10-CM | POA: Insufficient documentation

## 2021-04-19 DIAGNOSIS — Z79899 Other long term (current) drug therapy: Secondary | ICD-10-CM | POA: Insufficient documentation

## 2021-04-19 NOTE — Nursing Note (Signed)
Body mass index is 17.3 kg/m.    Fall Risk Assessment  Do you feel unsteady when standing or walking?: Yes  Do you worry about falling?: Yes  Have you fallen in the past year?: No    PHQ Questionnaire  Little interest or pleasure in doing things.: Not at all  Feeling down, depressed, or hopeless: Not at all  PHQ 2 Total: 0         Travel Screening     Question   Response    In the last 10 days, have you been in contact with someone who was confirmed or suspected to have Coronavirus/COVID-19?  No / Unsure    Have you had a COVID-19 viral test in the last 10 days?  No    Do you have any of the following new or worsening symptoms?  None of these    Have you traveled internationally or domestically in the last month?  No      Travel History   Travel since 03/19/21    No documented travel since 03/19/21       Functional Health Screening:          Celedonio Savage, LPN  1/0/1751, 02:58

## 2021-04-19 NOTE — Progress Notes (Signed)
[facility]  PEDIATRICS/INTERNAL MEDICINE, [facility] CLIN[facility]  [address]  [place] [address]  [phone]  04/19/2021       Name:  Theresa Greene MRN: [id number]   Date:    04/19/2021 Age:  86 y.o.       Reason for Visit:  Chief Complaint   Patient presents with   . Blood Pressure Check     Sitting 149/90 HR 70 Standing 131/77 HR 80 Laying 172/94 HR 76         Subjective:   Theresa Greene is a 86 y.o. female who comes in today for follow up on high blood pressure. She says that she has been doing well. She did have an episode of abdominal pain. She saw Dr. Barbaraann Share for this and she is pretty sure that they are going to do a colonoscopy. She says that her blood pressure has been good at home. She says that this morning it was in the 150/80s range. She says it hasn't ever been above this since we made the adjustments. She does still have some mild dizziness when she gets up from lying down, but she always has this and knows to take it slow. She denies any headaches. She actually has been able to go back to church on Wednesday and Sunday nights and this was one of her goals. They do not go on Sunday morning because it is too crowded. She was very happy about this.     She tried to get her shingles vaccine at the beginning of the year, but the pharmacy told her that her part would be $180.00 dollars for each shot with her insurance. She says she is going to try to get it at the beginning of the year. She has actually had 4 COVID shots and will take her 5th one as soon as it is available at the pharmacy. She says she will take the influenza vaccine either here or at the pharmacy. She also wants to update her tetanus, which she will have to do at the pharmacy due to her insurance.     Past Medical History:    Past Medical History:   Diagnosis Date   . CAD S/P percutaneous coronary angioplasty 08/06/2014    Pt reports 5 years ago   . Charcot's joint of  foot, left 08/06/2014   . Gastroesophageal reflux disease 08/06/2014   . History of cardiac pacemaker 08/06/2014   . History of CVA (cerebrovascular accident) 08/06/2014    1995, no residual deficits   . HTN (hypertension)    . Hyperlipidemia 12/25/2019   . Neuropathy (CMS HCC)    . Paroxysmal atrial fibrillation (CMS HCC) 08/06/2014   . PUD (peptic ulcer disease) 08/06/2014    C/b GIB Spring 2015   . Rheumatoid arthritis (CMS HCC) 08/06/2014   . Stage 3 chronic kidney disease (CMS HCC) 12/25/2019    Last Assessment & Plan:  Renal function stable, at baseline Stable electrolytes; restart sodium bicarb 650 mg BID  Continue renal protective ARB Discussed CKD risk factor modifications, including tight BP/glucose control, low protein diet, and avoidance of NSAIDs   . Type 2 diabetes mellitus (CMS HCC) 08/06/2014   . Vitamin D deficiency 12/25/2019      Past Surgical History:    Past Surgical History:   Procedure Laterality Date   . CORONARY ARTERY ANGIOPLASTY  2011   . HX BACK SURGERY      lower  back x 3   . HX HIP REPLACEMENT Right    . HX NISSEN FUNDOPLICATION  2002    then three others to repair problems   . HX OPEN CHOLECYSTECTOMY  1962    with common duxt exploration   . HX PACEMAKER INSERTION  2019   . KNEE ARTHROPLASTY Right    . OPEN REDUCTION INTERNAL FIXATION FRACTURE FEMUR Right 05/07/2020    Performed by Jeanell Sparrow, MD at [facility] OR 5 [facility]   . PANCREATIC PSEUDOCYST DRAINAGE  1971   . TOTAL ABDOMINAL HYSTERECTOMY       Family History:    Family Medical History:     Problem Relation (Age of Onset)    Ovarian Cancer Mother    Pancreatic Cancer Father           Social History:   Social History     Tobacco Use   . Smoking status: Never Smoker   . Smokeless tobacco: Never Used   Substance Use Topics   . Alcohol use: Never      Social History     Substance and Sexual Activity   Drug Use Never      Social History     Social History Narrative    Lives at home with husband. 2 grown daughters - an Charity fundraiser and a Theme park manager. She did Building control surveyor for 23 years.         Allergies:    Allergies   Allergen Reactions   . Iodinated Contrast Media Anaphylaxis   . Etodolac NO Steroids unless approved by Attending Physician   . Iv Contrast      makese her serverly sick   . Codeine  Other Adverse Reaction (Add comment) and Nausea/ Vomiting     Stomach pain  Stomach pain        Home Medications:    Current Outpatient Medications:   .  amiodarone (PACERONE) 200 mg Oral Tablet, Take 1 Tablet (200 mg total) by mouth Once a day for 90 days, Disp: 90 Tablet, Rfl: 3  .  amLODIPine (NORVASC) 5 mg Oral Tablet, Take 1 Tablet (5 mg total) by mouth Once a day, Disp: 90 Tablet, Rfl: 4  .  atorvastatin (LIPITOR) 20 mg Oral Tablet, Take 1 Tablet (20 mg total) by mouth Once a day for 90 days, Disp: 90 Tablet, Rfl: 3  .  clopidogreL (PLAVIX) 75 mg Oral Tablet, Take 1 Tablet (75 mg total) by mouth Once a day for 90 days, Disp: 90 Tablet, Rfl: 3  .  CREON 36,000-114,000- 180,000 unit Oral Capsule, Delayed Release(E.C.), , Disp: , Rfl:   .  Dexlansoprazole (DEXILANT) 60 mg Oral Cap, Delayed Rel., Multiphasic, Take 1 Capsule (60 mg total) by mouth Once a day for 90 days, Disp: 90 Capsule, Rfl: 3  .  erythromycin (ROMYCIN) 5 mg/gram (0.5 %) Ophthalmic Ointment, , Disp: , Rfl:   .  ezetimibe (ZETIA) 10 mg Oral Tablet, Take 1 Tablet (10 mg total) by mouth Once a day for 90 days, Disp: 90 Tablet, Rfl: 3  .  gabapentin (NEURONTIN) 100 mg Oral Capsule, Take 1 Capsule (100 mg total) by mouth Twice daily for 90 days, Disp: 180 Capsule, Rfl: 0  .  gemfibroziL (LOPID) 600 mg Oral Tablet, Take 1 Tablet (600 mg total) by mouth Twice a day before meals for 90 days, Disp: 180 Tablet, Rfl: 3  .  losartan (COZAAR) 50 mg Oral Tablet, Take 0.5 Tablets (25 mg total) by mouth  Twice daily for 90 days (Patient taking differently: Take 50 mg by mouth Once a day), Disp: 90 Tablet, Rfl: 3  .  metoprolol succinate (TOPROL-XL) 100 mg Oral Tablet Sustained Release 24 hr, Take 1  Tablet (100 mg total) by mouth Once a day, Disp: 90 Tablet, Rfl: 4  .  sodium bicarbonate 650 mg Oral Tablet, Take 1 Tablet (650 mg total) by mouth Twice daily, Disp: 180 Tablet, Rfl: 3       Review of Systems:   All systems have been reviewed and found to be negative except for as stated above in the history of present illness.     Physical Examination:   Vitals: BP (!) 149/90   Pulse 70   Temp 36.2 C (97.2 F) (Thermal Scan)   Resp 17   Wt 48.6 kg (107 lb 3.2 oz)   SpO2 94%   BMI 17.30 kg/m     General:  No apparent distress.  Well nourished and well developed.  Interactive.   HEENT:  Atraumatic, normocephalic.  Nares patent without lesion or discharge. Mucous membranes moist. Tongue protrudes midline. No tonsillar erythema or exudates.  Neck:  Supple. Trachea is midline. No JVD. No adenopathy. No thyromegaly.  Respiratory: No respiratory distress. Lungs clear to auscultation bilaterally. No wheezes, rales, or rhonchi.  Cardiovascular: Regular rate and rhythm. S1, S2 present. No murmurs, rubs, or gallops. Pulses are +2 and equal in all extremities.  Extremities:  No clubbing, cyanosis or edema.     Assessment:   (I10) Primary hypertension  (primary encounter diagnosis)     Plan:   Blood pressure is slightly elevated, but she is no longer having orthostasis symptoms or excessively elevated readings with symptoms, so we are going to leave her regimen the same. Continue on amlodipine  oral daily, Toprol XL 100 mg oral daily, and losartan 50 mg oral daily. Check PRN at home. I will see her back in 3 months for a recheck and order her laboratories at that time for the next visit. Call sooner if any issues.     She can follow up at the pharmacy for her immunizations to be updated and I wrote these all down on a card for her.     Patient was counseled on diagnosis, follow-up, and side effect of medications.  Reasons for follow up in clinic or emergency department were discussed.    Orders:  No orders of the  defined types were placed in this encounter.     Referrals:   Orders Placed This Encounter   No orders of the following type(s) were placed in this encounter: Outpatient Referral.      Discharge Medication List:       Current Discharge Medication List          Accurate as of April 19, 2021 11:00 AM. If you have any questions, ask your nurse or doctor.            CONTINUE these medications which have CHANGED during your visit.      Details   losartan 50 mg Tablet  Commonly known as: COZAAR  What changed:    how much to take   when to take this   25 mg, Oral, 2 TIMES DAILY  Qty: 90 Tablet  Refills: 3        CONTINUE these medications - NO CHANGES were made during your visit.      Details   amiodarone 200 mg Tablet  Commonly known as: PACERONE  200 mg, Oral, DAILY  Qty: 90 Tablet  Refills: 3     amLODIPine 5 mg Tablet  Commonly known as: NORVASC   5 mg, Oral, DAILY  Qty: 90 Tablet  Refills: 4     atorvastatin 20 mg Tablet  Commonly known as: LIPITOR   20 mg, Oral, DAILY  Qty: 90 Tablet  Refills: 3     clopidogreL 75 mg Tablet  Commonly known as: PLAVIX   75 mg, Oral, DAILY  Qty: 90 Tablet  Refills: 3     Creon 36,000-114,000- 180,000 unit Capsule, Delayed Release(E.C.)  Generic drug: lipase-protease-amylase   No dose, route, or frequency recorded.  Refills: 0     Dexlansoprazole 60 mg Cap, Delayed Rel., Multiphasic  Commonly known as: Dexilant   60 mg, Oral, DAILY  Qty: 90 Capsule  Refills: 3     erythromycin 5 mg/gram (0.5 %) Ointment  Commonly known as: ROMYCIN   No dose, route, or frequency recorded.  Refills: 0     ezetimibe 10 mg Tablet  Commonly known as: ZETIA   10 mg, Oral, DAILY  Qty: 90 Tablet  Refills: 3     gabapentin 100 mg Capsule  Commonly known as: NEURONTIN   100 mg, Oral, 2 TIMES DAILY  Qty: 180 Capsule  Refills: 0     gemfibroziL 600 mg Tablet  Commonly known as: LOPID   600 mg, Oral, 2 TIMES DAILY BEFORE MEALS  Qty: 180 Tablet  Refills: 3     metoprolol succinate 100 mg Tablet Sustained  Release 24 hr  Commonly known as: TOPROL-XL   100 mg, Oral, DAILY  Qty: 90 Tablet  Refills: 4     sodium bicarbonate 650 mg Tablet   650 mg, Oral, 2 TIMES DAILY  Qty: 180 Tablet  Refills: 3               Follow up: Return in about 3 months (around 07/19/2021) for high blood pressure. Patient was advised to follow up sooner if issues arise.     Jaynell Castagnola Hill-Reinert, DO    This note was partially created using M*Modal fluency direct system (voice recognition software ) and is inherently subject to errors including those of syntax and "sound- alike" substitutions which may escape proofreading.  In such instances, original meaning may be extrapolated by contextual derivation.

## 2021-04-24 ENCOUNTER — Other Ambulatory Visit (INDEPENDENT_AMBULATORY_CARE_PROVIDER_SITE_OTHER): Payer: Self-pay | Admitting: Internal Medicine

## 2021-04-24 NOTE — Telephone Encounter (Signed)
Medication refill request      Last ordered 01/25/21 90 day supply    Last appt 04/19/21    Follow up appt 07/19/21

## 2021-04-26 ENCOUNTER — Ambulatory Visit (INDEPENDENT_AMBULATORY_CARE_PROVIDER_SITE_OTHER): Payer: Self-pay | Admitting: Ophthalmology

## 2021-04-27 ENCOUNTER — Inpatient Hospital Stay (HOSPITAL_COMMUNITY)
Admission: RE | Admit: 2021-04-27 | Discharge: 2021-04-27 | Disposition: A | Discharge status: Home / Self Care | Payer: Medicare Other | Source: Ambulatory Visit

## 2021-04-27 ENCOUNTER — Encounter (INDEPENDENT_AMBULATORY_CARE_PROVIDER_SITE_OTHER): Payer: Self-pay | Admitting: Ophthalmology

## 2021-04-27 ENCOUNTER — Other Ambulatory Visit: Payer: Self-pay

## 2021-04-27 ENCOUNTER — Ambulatory Visit: Payer: Medicare Other | Attending: Ophthalmology | Admitting: Ophthalmology

## 2021-04-27 DIAGNOSIS — H579 Unspecified disorder of eye and adnexa: Secondary | ICD-10-CM

## 2021-04-27 DIAGNOSIS — H16211 Exposure keratoconjunctivitis, right eye: Secondary | ICD-10-CM | POA: Insufficient documentation

## 2021-04-27 DIAGNOSIS — H02203 Unspecified lagophthalmos right eye, unspecified eyelid: Secondary | ICD-10-CM | POA: Insufficient documentation

## 2021-04-27 NOTE — Progress Notes (Addendum)
[facility]  [address]  [place] [address]  Operated by [facility]         Patient Name: Theresa Greene  MRN#: [id number]  Birthdate: 1935-02-14    Date of Service: 04/27/2021    Chief Complaint     Eye Problem          Renell Coaxum is a 86 y.o. female who presents today for evaluation/consultation of:  HPI     PMH: BCC removed near R temple (3-4 years ago)  POH: CEIOL OU    Pt is here for NPV, lagophthalmos OD referred by Dr. Janee Morn   Pt endorses constant sand-like FBS OD   Pt notes that Dr. Janee Morn has taken eyelashes from OD to help with irritation  Pt states that they also have had hx horizontal diplopia   Pt denies new FOL, new floaters, distortion, HA, and pain  Pt using antibx gtts QID      Last edited by Noel Christmas, COA on 04/27/2021  1:29 PM. (History)        ROS     Positive for: Eyes (lagophthalmos OD)    Negative for: Constitutional, Gastrointestinal, Neurological, Skin, Genitourinary, Musculoskeletal, HENT, Endocrine, Cardiovascular, Respiratory, Psychiatric, Allergic/Imm, Heme/Lymph    Last edited by Noel Christmas, COA on 04/27/2021  1:21 PM. (History)         All other systems Negative    Noel Christmas, COA  04/27/2021, 13:32        MD Addition to HPI: NPV, referred by Dr. Janee Morn for lagophthalmos OD. 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 Pt reports that for approximately one year she has had significant foreign body sensation OD described as "sand in the eye." Pt also has history of trichiasis s/p multiple episodes of epilation. Crusting on eyelids in AM Has not noticed any drooping of mouth. Occasional brief episodes of double vision that resolves quickly. Hx BULB and possible LTS/entropion repair? Pt has been in bandage contact lenses for approximately 6 weeks prior to encounter which she states improves pain.     Current Regimen: Artificial tears TID, Moxifloxacin QID OD x 3 months    History source:  patient        Review of outside documentation: none      Testing ordered/interpreted today: CT facial bones      Previous imaging reviewed: None      Assessment         ICD-10-CM    1. 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 CT orbits wo contrast given pacemaker and allergy to contrast    Prescription/Drug Management: None    Treatment Plan discussed with/Letter prepared for: None    Follow up:    I have asked Assata Juncaj to follow up in 1 month  I have seen and examined the above patient. I discussed the above diagnoses listed in the assessment and the above ophthalmic plan of care with the patient and patient's family. All questions were answered. I reviewed and, when necessary, made changes to the technician/resident note, documented ophthalmology exam, chief complaint, history of present illness, allergies, review of systems, past medical, past surgical, family and social history.    Orders Placed This Encounter   Procedures    OPH EXTERNAL PHOTOS       Orders Placed This Encounter    CT ORBITS WO IV CONTRAST       Normand Sloop, MD  04/27/2021, 13:36    Base Eye Exam     Visual Acuity (Snellen - Linear)       Right Left    Dist cc 20/200 20/20    Dist ph cc 20/100     Correction: Glasses          Tonometry (Tonopen, 1:32 PM)       Right Left    Pressure 9 11          Pupils       Pupils Shape React APD    Right PERRL Round Sluggish None    Left PERRL Round Minimal  poss +?          Visual Fields (Counting fingers)       Right Left     Full Full          Extraocular Movement       Right Left     Full Full          Neuro/Psych     Oriented x3: Yes    Mood/Affect: Normal            Slit Lamp and Fundus Exam     External Exam       Right Left    External Deep sulcus, Decreased V1-3 sensation, weak right facial nerve function Deep sulcus    MRD1 5 mm 1 mm    Superior scleral show 0 mm 0 mm    Inferior scleral show 0 mm 0 mm    Levator 12 mm 10 mm    Lagophthalmos 2 mm 0 mm          Slit Lamp Exam       Right Left    Lids/Lashes Lagophthalmos with poor Bells reflex, few trichiatic lashes without cornea touch, RLL punctal plug  Ptosis, few trichiatic lashes without corneal touch     Conjunctiva/Sclera Trace injection  Trace injection     Cornea BCL in place, diffuse inferior staining  Tear    Anterior Chamber Deep and quiet Deep and quiet    Iris Round and reactive Round and reactive    Lens Posterior chamber intraocular lens Posterior chamber intraocular lens    Vitreous Normal Normal            Refraction     Wearing Rx       Sphere Cylinder Axis Add    Right [place] +1.00 174 +2.75    Left [place] +1.00 143 +2.75    Age: 50yrs    Type: Bifocal              External       Right Left    MRD1 5 mm 1 mm    Superior scleral show 0 mm 0 mm    Inferior scleral show 0 mm 0 mm    Levator  12 mm 10 mm    Lagophthalmos 2 mm 0 mm        I saw and examined the patient. I reviewed the resident's note. I agree with the findings and plan of care as documented in the resident's note. Any exceptions/additions are edited/noted.    Pierce Crane, MD  04/28/2021, 16:53

## 2021-05-04 ENCOUNTER — Ambulatory Visit (HOSPITAL_COMMUNITY): Payer: Self-pay

## 2021-05-08 ENCOUNTER — Ambulatory Visit: Payer: Medicare Other | Attending: NURSE PRACTITIONER

## 2021-05-08 ENCOUNTER — Other Ambulatory Visit: Payer: Self-pay

## 2021-05-08 DIAGNOSIS — N1832 Chronic kidney disease, stage 3b: Secondary | ICD-10-CM | POA: Insufficient documentation

## 2021-05-08 LAB — CBC WITH DIFF
BASOPHIL #: <0.1 10*3/uL (ref <=0.20)
BASOPHIL %: 0 %
EOSINOPHIL #: 0.25 10*3/uL (ref <=0.50)
EOSINOPHIL %: 3 %
HCT: 44 % (ref 34.8–46.0)
HGB: 14.2 g/dL (ref 11.5–16.0)
IMMATURE GRANULOCYTE #: <0.1 10*3/uL (ref <0.10)
IMMATURE GRANULOCYTE %: 0 % (ref 0–1)
LYMPHOCYTE #: 2.54 10*3/uL (ref 1.00–4.80)
LYMPHOCYTE %: 28 %
MCH: 31.1 pg (ref 26.0–32.0)
MCHC: 32.3 g/dL (ref 31.0–35.5)
MCV: 96.3 fL (ref 78.0–100.0)
MONOCYTE #: 1.03 10*3/uL (ref 0.20–1.10)
MONOCYTE %: 11 %
MPV: 10.3 fL (ref 8.7–12.5)
NEUTROPHIL #: 5.24 10*3/uL (ref 1.50–7.70)
NEUTROPHIL %: 58 %
PLATELETS: 377 10*3/uL (ref 150–400)
RBC: 4.57 10*6/uL (ref 3.85–5.22)
RDW-CV: 14.5 % (ref 11.5–15.5)
WBC: 9.1 10*3/uL (ref 3.7–11.0)

## 2021-05-08 LAB — RENAL FUNCTION PANEL
ALBUMIN: 3.2 g/dL — ABNORMAL LOW (ref 3.4–4.8)
ANION GAP: 12 mmol/L (ref 4–13)
BUN/CREA RATIO: 23 — ABNORMAL HIGH (ref 6–22)
BUN: 34 mg/dL — ABNORMAL HIGH (ref 8–25)
CALCIUM: 9.6 mg/dL (ref 8.8–10.2)
CHLORIDE: 105 mmol/L (ref 96–111)
CO2 TOTAL: 22 mmol/L — ABNORMAL LOW (ref 23–31)
CREATININE: 1.46 mg/dL — ABNORMAL HIGH (ref 0.60–1.05)
ESTIMATED GFR: 35 mL/min/BSA — ABNORMAL LOW (ref >=60)
GLUCOSE: 101 mg/dL (ref 65–125)
PHOSPHORUS: 4.2 mg/dL — ABNORMAL HIGH (ref 2.3–4.0)
POTASSIUM: 4.4 mmol/L (ref 3.5–5.1)
SODIUM: 139 mmol/L (ref 136–145)

## 2021-05-08 LAB — MICROALBUMIN/CREATININE RATIO, URINE, RANDOM
CREATININE RANDOM URINE: 82 mg/dL (ref 50–100)
MICROALBUMIN RANDOM URINE: 16.7 mg/dL
MICROALBUMIN/CREATININE RATIO RANDOM URINE: 203.7 mg/g — ABNORMAL HIGH (ref <30.0)

## 2021-05-08 LAB — PROTEIN, TOTAL URINE, RANDOM
CREATININE RANDOM URINE: 82 mg/dL (ref 50–100)
PROTEIN RANDOM URINE: 42 mg/dL
PROTEIN/CREATININE: 512 mg/g — ABNORMAL HIGH (ref 10–105)

## 2021-05-19 ENCOUNTER — Ambulatory Visit (HOSPITAL_COMMUNITY): Payer: Self-pay

## 2021-05-23 ENCOUNTER — Inpatient Hospital Stay
Admission: RE | Admit: 2021-05-23 | Discharge: 2021-05-23 | Disposition: A | Discharge status: Home / Self Care | Payer: Medicare Other | Source: Ambulatory Visit | Attending: Ophthalmology | Admitting: Ophthalmology

## 2021-05-23 ENCOUNTER — Other Ambulatory Visit: Payer: Self-pay

## 2021-05-23 DIAGNOSIS — H02203 Unspecified lagophthalmos right eye, unspecified eyelid: Secondary | ICD-10-CM | POA: Insufficient documentation

## 2021-05-23 DIAGNOSIS — H16211 Exposure keratoconjunctivitis, right eye: Secondary | ICD-10-CM | POA: Insufficient documentation

## 2021-05-25 ENCOUNTER — Encounter (INDEPENDENT_AMBULATORY_CARE_PROVIDER_SITE_OTHER): Payer: Self-pay | Admitting: Ophthalmology

## 2021-05-25 DIAGNOSIS — J341 Cyst and mucocele of nose and nasal sinus: Secondary | ICD-10-CM

## 2021-05-25 DIAGNOSIS — J342 Deviated nasal septum: Secondary | ICD-10-CM

## 2021-05-25 DIAGNOSIS — Z9889 Other specified postprocedural states: Secondary | ICD-10-CM

## 2021-05-25 DIAGNOSIS — H05409 Unspecified enophthalmos, unspecified eye: Secondary | ICD-10-CM

## 2021-06-08 ENCOUNTER — Ambulatory Visit (INDEPENDENT_AMBULATORY_CARE_PROVIDER_SITE_OTHER): Payer: Medicare Other | Admitting: Ophthalmology

## 2021-06-08 ENCOUNTER — Encounter (INDEPENDENT_AMBULATORY_CARE_PROVIDER_SITE_OTHER): Payer: Self-pay | Admitting: Ophthalmology

## 2021-06-08 ENCOUNTER — Encounter (INDEPENDENT_AMBULATORY_CARE_PROVIDER_SITE_OTHER): Payer: Medicare Other

## 2021-06-08 ENCOUNTER — Other Ambulatory Visit: Payer: Self-pay

## 2021-06-08 DIAGNOSIS — H02032 Senile entropion of right lower eyelid: Secondary | ICD-10-CM

## 2021-06-08 DIAGNOSIS — H02052 Trichiasis without entropian right lower eyelid: Secondary | ICD-10-CM

## 2021-06-08 DIAGNOSIS — H02402 Unspecified ptosis of left eyelid: Secondary | ICD-10-CM

## 2021-06-08 DIAGNOSIS — H02033 Senile entropion of right eye, unspecified eyelid: Secondary | ICD-10-CM

## 2021-06-08 NOTE — Progress Notes (Signed)
[facility]  [place] [address]  [facility]         Patient Name: Theresa Greene  MRN#: [id number]  Basco: 12-04-1934    Date of Service: 06/08/2021    Chief Complaint    Follow Up         Theresa Greene is a 86 y.o. female who presents today for evaluation/consultation of:  HPI     Follow Up     Additional comments: 1 month           Last edited by Ancil Boozer, LPN on 36/46/8032  1:22 PM.        ROS    Negative for: Constitutional, Gastrointestinal, Neurological, Skin, Genitourinary, Musculoskeletal, HENT, Endocrine, Cardiovascular, Eyes, Respiratory, Psychiatric, Allergic/Imm, Heme/Lymph  Last edited by Ancil Boozer, LPN on 48/25/0037  0:48 PM.         All other systems Negative        MD Addition to HPI:   Patient here for follow up with history of right exposure keratopathy. Notes things are pretty stable. Using ointment TID Notes her droopiness in the left eye is interfering with her reading.     History source: patient        Review of outside documentation: none      Testing ordered/interpreted today: CT facial bones      Previous imaging reviewed: CT face = no obvious mass noted      Assessment         ICD-10-CM    1. Ptosis, left  H02.402 OPH 1 ISOPTER VF     OPH EXTERNAL PHOTOS      2. Trichiasis of right lower eyelid  H02.052       3. 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   - Trichiatic lashes removed today   - Discussed merits of entropion repair in the lower lid to more permanently manage the lashes    Ptosis of the left upper lid   - Previously had right lid done   - ADL's affected as noted above in HPI   - Manual elevation shows significant improvement on peripheral visual field testing   - Low clinical suspicion for non-involutional pathology (trauma, neurologic, MG, etc.)   - MRD noted be less than 2 mm OS    - Discussed option of upper eyelid surgery versus continued observation with patient   - Risks, benefits, and alternatives to surgery discussed, including use of topical therapy to provide temporary relief of ptosis   - In particular discussed possible risk of increased dry eye symptoms in the early post-operative period   - Will proceed as discussed with Left ptosis repair   - Patient agrees and wishes to proceed      Prescription/Drug Management: None    Treatment Plan discussed with/Letter prepared for: None    Follow up:    I have asked Theresa Greene to follow up in 1 month       SURGERY    Procedure: Right lower lid entropion repair, left upper lid ptosis repair    Diagnosis:     ICD-10-CM    1. Ptosis, left  H02.402 OPH 1 ISOPTER VF     OPH EXTERNAL PHOTOS      2. Trichiasis of right lower eyelid  H02.052  3. 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 I discussed the above diagnoses listed in the assessment and the above ophthalmic plan of care with the patient and patient's family. All questions were answered. I reviewed and, when necessary, made changes to the technician/resident note, documented ophthalmology exam, chief complaint, history of present illness, allergies, review of systems, past medical, past surgical, family and social history.    Orders Placed This Encounter   Procedures   . OPH 1 ISOPTER VF   . OPH EXTERNAL PHOTOS       No orders of the defined types were placed in this encounter.      Base Eye Exam     Visual Acuity (Snellen - Linear)       Right Left    Dist sc 20/400 20/40          Neuro/Psych     Oriented x3: Yes    Mood/Affect: Normal            Slit Lamp and Fundus Exam     External Exam       Right Left    External Deep sulcus, Decreased V1-3 sensation, weak right facial nerve function Deep sulcus    MRD1 4 mm 1 mm          Slit Lamp Exam        Right Left    Lids/Lashes Lagophthalmos with poor Bells reflex, few trichiatic lashes with cornea touch, RLL punctal plug Ptosis, few trichiatic lashes without corneal touch    Conjunctiva/Sclera Trace injection Trace injection     Cornea 1+ PEE, no epi defects, ++ ointment Tear    Anterior Chamber Deep and quiet Deep and quiet    Iris Round and reactive Round and reactive    Lens Posterior chamber intraocular lens Posterior chamber intraocular lens    Vitreous Normal Normal              External       Right Left    MRD1 4 mm 1 mm        Blase Mess, MD  06/08/2021, 15:21

## 2021-06-14 ENCOUNTER — Other Ambulatory Visit (INDEPENDENT_AMBULATORY_CARE_PROVIDER_SITE_OTHER): Payer: Self-pay | Admitting: FAMILY MEDICINE

## 2021-06-14 ENCOUNTER — Telehealth (INDEPENDENT_AMBULATORY_CARE_PROVIDER_SITE_OTHER): Payer: Self-pay | Admitting: Internal Medicine

## 2021-06-14 DIAGNOSIS — M069 Rheumatoid arthritis, unspecified: Secondary | ICD-10-CM

## 2021-06-14 MED ORDER — PREDNISONE 20 MG TABLET
20.0000 mg | ORAL_TABLET | Freq: Every day | ORAL | 0 refills | Status: DC
Start: 2021-06-14 — End: 2021-06-15

## 2021-06-14 NOTE — Telephone Encounter (Signed)
Daughter called requesting a medication for her moms flare up with arthiritus. She said in the past they gave her mom a steroid taper. Daughter said he moms kidney function is super bad and did not know what to recommend for her to take.

## 2021-06-15 ENCOUNTER — Inpatient Hospital Stay (HOSPITAL_BASED_OUTPATIENT_CLINIC_OR_DEPARTMENT_OTHER)
Admission: RE | Admit: 2021-06-15 | Discharge: 2021-06-15 | Disposition: A | Discharge status: Home / Self Care | Payer: Medicare Other | Source: Ambulatory Visit

## 2021-06-15 ENCOUNTER — Encounter (INDEPENDENT_AMBULATORY_CARE_PROVIDER_SITE_OTHER): Payer: Self-pay | Admitting: NURSE PRACTITIONER

## 2021-06-15 ENCOUNTER — Ambulatory Visit: Payer: Medicare Other | Attending: NURSE PRACTITIONER | Admitting: NURSE PRACTITIONER

## 2021-06-15 ENCOUNTER — Other Ambulatory Visit: Payer: Self-pay

## 2021-06-15 VITALS — BP 133/90 | HR 70 | Temp 98.1°F | Resp 18 | Ht 66.0 in | Wt 106.0 lb

## 2021-06-15 DIAGNOSIS — G8929 Other chronic pain: Secondary | ICD-10-CM | POA: Insufficient documentation

## 2021-06-15 DIAGNOSIS — M25512 Pain in left shoulder: Secondary | ICD-10-CM | POA: Insufficient documentation

## 2021-06-15 DIAGNOSIS — M19012 Primary osteoarthritis, left shoulder: Secondary | ICD-10-CM

## 2021-06-15 MED ORDER — METHYLPREDNISOLONE 4 MG TABLETS IN A DOSE PACK
ORAL_TABLET | ORAL | 0 refills | Status: DC
Start: 2021-06-15 — End: 2021-06-30

## 2021-06-15 MED ORDER — TRAMADOL 50 MG TABLET
1.0000 | ORAL_TABLET | Freq: Four times a day (QID) | ORAL | 0 refills | Status: AC | PRN
Start: 2021-06-15 — End: 2021-06-22

## 2021-06-15 NOTE — Nursing Note (Signed)
Body mass index is 17.11 kg/m.        Fall Risk Assessment  Do you feel unsteady when standing or walking?: No  Do you worry about falling?: No  Have you fallen in the past year?: No          Little interest or pleasure in doing things.: 0  Feeling down, depressed, or hopeless: 0  PHQ 2 Total: 0           Travel Screening     Question Response    In the last 10 days, have you been in contact with someone who was confirmed or suspected to have Coronavirus/COVID-19? No / Unsure    Have you had a COVID-19 viral test in the last 10 days? No    Do you have any of the following new or worsening symptoms? None of these    Have you traveled internationally or domestically in the last month? No      Travel History   Travel since 05/15/21    No documented travel since 05/15/21

## 2021-06-15 NOTE — Progress Notes (Signed)
[facility]  [address] [place], [place] [address]  Phone [phone]  Fax [phone]    Patient name: Theresa Greene  Date of birth: 07-28-1935  MRN #:  [id number]    Date of service: 06/15/2021    Reason for Visit:  Chief Complaint   Patient presents with   . Arthritis     C/o arthritic pain left shoulder with swelling and warmness to touch.     Has stopped Celebrex due to kidney function. Sept labs from Nephrologist showed that creatine was 1.46 and GFR was 35.     Dr. Wonda Olds started patient on Prednisone       History of Present Illness: Theresa Greene is a 86 y.o. female who presents today complaining of left shoulder pain.  She denies any recent injury or unusual activity but admits to a long history of arthritis and had a total left shoulder replacement many years ago.  Reports she was recently taken off of Celebrex due to decreased renal function.  She had called the office last week about her shoulder pain and a prescription for prednisone was called into patient's pharmacy.  Patient reports she has had little relief from the course of prednisone.     Medications    Current Outpatient Medications:   .  amiodarone (PACERONE) 200 mg Oral Tablet, Take 1 Tablet (200 mg total) by mouth Once a day for 90 days (Patient taking differently: Take 100 mg by mouth Once a day), Disp: 90 Tablet, Rfl: 3  .  amLODIPine (NORVASC) 5 mg Oral Tablet, Take 1 Tablet (5 mg total) by mouth Once a day, Disp: 90 Tablet, Rfl: 4  .  atorvastatin (LIPITOR) 20 mg Oral Tablet, Take 1 Tablet (20 mg total) by mouth Once a day for 90 days, Disp: 90 Tablet, Rfl: 3  .  carboxymethylcellulose sodium (REFRESH OPHT), Administer into affected eye(s), Disp: , Rfl:   .  clopidogreL (PLAVIX) 75 mg Oral Tablet, Take 1 Tablet (75 mg total) by mouth Once a day for 90 days, Disp: 90 Tablet, Rfl: 3  .  CREON 36,000-114,000- 180,000 unit Oral Capsule, Delayed Release(E.C.), , Disp: , Rfl:   .  ezetimibe (ZETIA) 10 mg  Oral Tablet, Take 1 Tablet (10 mg total) by mouth Once a day for 90 days, Disp: 90 Tablet, Rfl: 3  .  gabapentin (NEURONTIN) 100 mg Oral Capsule, Take 1 Capsule (100 mg total) by mouth Every night for 90 days, Disp: 180 Capsule, Rfl: 1  .  gemfibroziL (LOPID) 600 mg Oral Tablet, Take 1 Tablet (600 mg total) by mouth Twice a day before meals for 90 days, Disp: 180 Tablet, Rfl: 3  .  losartan (COZAAR) 50 mg Oral Tablet, Take 0.5 Tablets (25 mg total) by mouth Twice daily for 90 days (Patient taking differently: Take 50 mg by mouth Once a day), Disp: 90 Tablet, Rfl: 3  .  Methylprednisolone (MEDROL DOSEPACK) 4 mg Oral Tablets, Dose Pack, Take as instructed., Disp: 21 Tablet, Rfl: 0  .  metoprolol succinate (TOPROL-XL) 100 mg Oral Tablet Sustained Release 24 hr, Take 1 Tablet (100 mg total) by mouth Once a day, Disp: 90 Tablet, Rfl: 4  .  sodium bicarbonate 650 mg Oral Tablet, Take 1 Tablet (650 mg total) by mouth Twice daily, Disp: 180 Tablet, Rfl: 3  .  traMADoL (ULTRAM) 50 mg Oral Tablet, Take 1-2 Tablets (50-100 mg total) by mouth Every 6 hours as needed for Pain  for up to 7 days, Disp: 56 Tablet, Rfl: 0    Allergies  Allergies   Allergen Reactions   . Iodinated Contrast Media Anaphylaxis   . Etodolac NO Steroids unless approved by Attending Physician   . Iv Contrast      makese her serverly sick   . Codeine  Other Adverse Reaction (Add comment) and Nausea/ Vomiting     Stomach pain  Stomach pain        Vitals  Vitals:    06/15/21 1324   BP: (!) 133/90   Pulse: 70   Resp: 18   Temp: 36.7 C (98.1 F)   SpO2: 94%   Weight: 48.1 kg (106 lb)   Height: 1.676 m (5\' 6" )   BMI: 17.14      Nursing Notes:   Johnney Killian, LPN  [id number]  Signed  Body mass index is 17.11 kg/m.        Fall Risk Assessment  Do you feel unsteady when standing or walking?: No  Do you worry about falling?: No  Have you fallen in the past year?: No          Little interest or pleasure in doing things.: 0  Feeling down, depressed,  or hopeless: 0  PHQ 2 Total: 0           Travel Screening     Question Response    In the last 10 days, have you been in contact with someone who was confirmed or suspected to have Coronavirus/COVID-19? No / Unsure    Have you had a COVID-19 viral test in the last 10 days? No    Do you have any of the following new or worsening symptoms? None of these    Have you traveled internationally or domestically in the last month? No      Travel History   Travel since 05/15/21    No documented travel since 05/15/21         Past Medical History  Past Surgical History:   Procedure Laterality Date   . CORONARY ARTERY ANGIOPLASTY  2011   . HX BACK SURGERY      lower back x 3   . HX HIP REPLACEMENT Right    . HX NISSEN FUNDOPLICATION  [id number]    then three others to repair problems   . [place]    with common duxt exploration   . HX PACEMAKER INSERTION  2019   . KNEE ARTHROPLASTY Right    . OPEN REDUCTION INTERNAL FIXATION FRACTURE FEMUR Right 05/07/2020    Performed by Jacqualine Code, MD at [facility]   . PANCREATIC PSEUDOCYST DRAINAGE  1971   . TOTAL ABDOMINAL HYSTERECTOMY        Family Medical History:     Problem Relation (Age of Onset)    Ovarian Cancer Mother    Pancreatic Cancer Father        Review of Systems  Review of Systems   Musculoskeletal: Positive for joint pain.        Left shoulder pain as discussed in HPI      Physical Examination  Physical Exam  Vitals and nursing note reviewed.   Constitutional:       Appearance: Normal appearance.      Comments: Appears frail and generally debilitated   HENT:      Head: Normocephalic and atraumatic.   Cardiovascular:      Rate and Rhythm: Normal rate  and regular rhythm.   Pulmonary:      Effort: Pulmonary effort is normal.      Breath sounds: Normal breath sounds.   Abdominal:      Palpations: Abdomen is soft.   Musculoskeletal:      Left shoulder: Tenderness present. Decreased range of motion. Decreased strength.   Skin:     General: Skin is warm.    Neurological:      General: No focal deficit present.      Mental Status: She is alert.      Motor: Weakness present.   Psychiatric:         Mood and Affect: Mood normal.         Thought Content: Thought content normal.         Judgment: Judgment normal.     Assessment and Plan  Problem List Items Addressed This Visit    None  Visit Diagnoses     Chronic left shoulder pain    -  Primary    Relevant Medications    traMADoL (ULTRAM) 50 mg Oral Tablet    Methylprednisolone (MEDROL DOSEPACK) 4 mg Oral Tablets, Dose Pack         Instructions:  Check x-ray of left shoulder and will call with results.  Start medications as directed and monitor for response.   Supportive care as discussed.   Anticipate 1-2 weeks recovery time.  RTC prn not improved 3-5d, sooner if worsens.  Verbalizes understanding of treatment regimen with no barriers identified.   The patient/family was/were given ample opportunity to ask questions and those questions were answered to their satisfaction. A good faith effort was made to reconcile the patient's medications. Pt advised to continue all current medications unchanged and medications reviewed. Patient/family encouraged to contact me with any additional questions or concerns, or go to the ED in an emergency      Burman Riis, NP    This note was partially created using M*Modal fluency direct system (voice recognition software ) and is inherently subject to errors including those of syntax and "sound- alike" substitutions which may escape proofreading.  In such instances, , original meaning may be extrapolated by contextual derivation.

## 2021-06-19 ENCOUNTER — Encounter (INDEPENDENT_AMBULATORY_CARE_PROVIDER_SITE_OTHER): Payer: Self-pay | Admitting: NURSE PRACTITIONER

## 2021-06-23 ENCOUNTER — Telehealth (INDEPENDENT_AMBULATORY_CARE_PROVIDER_SITE_OTHER): Payer: Self-pay | Admitting: Internal Medicine

## 2021-06-23 ENCOUNTER — Other Ambulatory Visit (INDEPENDENT_AMBULATORY_CARE_PROVIDER_SITE_OTHER): Payer: Self-pay | Admitting: FAMILY MEDICINE

## 2021-06-23 DIAGNOSIS — M069 Rheumatoid arthritis, unspecified: Secondary | ICD-10-CM

## 2021-06-23 NOTE — Telephone Encounter (Signed)
Patient requests referral to [facility] rheumatology, states the office phone number is [phone]. Patient also states that she has seen this doctor before but it has been over 5 years ago so they need a new referral. Patients rheumatoid arthritis is getting worse and that is why she would like the referral.

## 2021-06-30 ENCOUNTER — Other Ambulatory Visit (INDEPENDENT_AMBULATORY_CARE_PROVIDER_SITE_OTHER): Payer: Self-pay | Admitting: Internal Medicine

## 2021-06-30 DIAGNOSIS — M25512 Pain in left shoulder: Secondary | ICD-10-CM

## 2021-06-30 DIAGNOSIS — G8929 Other chronic pain: Secondary | ICD-10-CM

## 2021-06-30 MED ORDER — METHYLPREDNISOLONE 4 MG TABLETS IN A DOSE PACK
ORAL_TABLET | ORAL | 0 refills | Status: DC
Start: 2021-06-30 — End: 2021-07-20

## 2021-06-30 NOTE — Telephone Encounter (Signed)
Patient called requesting another steroid, she said her shoulder is bothering her again and the steroid is the only thing that helped it. She still has no heard about the referral for this problem but I told her it make take a few weeks.

## 2021-07-10 ENCOUNTER — Other Ambulatory Visit (INDEPENDENT_AMBULATORY_CARE_PROVIDER_SITE_OTHER): Payer: Self-pay | Admitting: Internal Medicine

## 2021-07-10 NOTE — Telephone Encounter (Signed)
Patient is needing a refill.

## 2021-07-11 ENCOUNTER — Other Ambulatory Visit (INDEPENDENT_AMBULATORY_CARE_PROVIDER_SITE_OTHER): Payer: Self-pay | Admitting: FAMILY MEDICINE

## 2021-07-11 DIAGNOSIS — G629 Polyneuropathy, unspecified: Secondary | ICD-10-CM

## 2021-07-11 DIAGNOSIS — M069 Rheumatoid arthritis, unspecified: Secondary | ICD-10-CM

## 2021-07-11 DIAGNOSIS — M14672 Charcot's joint, left ankle and foot: Secondary | ICD-10-CM

## 2021-07-11 MED ORDER — GABAPENTIN 100 MG CAPSULE
100.0000 mg | ORAL_CAPSULE | Freq: Two times a day (BID) | ORAL | 1 refills | Status: DC
Start: 2021-07-11 — End: 2022-06-20

## 2021-07-12 ENCOUNTER — Other Ambulatory Visit (INDEPENDENT_AMBULATORY_CARE_PROVIDER_SITE_OTHER): Payer: Self-pay | Admitting: FAMILY MEDICINE

## 2021-07-12 ENCOUNTER — Telehealth (INDEPENDENT_AMBULATORY_CARE_PROVIDER_SITE_OTHER): Payer: Self-pay | Admitting: FAMILY MEDICINE

## 2021-07-12 DIAGNOSIS — M069 Rheumatoid arthritis, unspecified: Secondary | ICD-10-CM

## 2021-07-12 NOTE — Telephone Encounter (Signed)
I received a fax from [facility] in [place] requesting recent lab results for the Rheumatoid factor.  I do not see those results in her chart.  Would you be willing to place an order for her to have those done and I will contact the patient and let her know to go have that testing done.  Then I can send the results so that they can get her scheduled.    Thank you!!!    Mendy

## 2021-07-13 ENCOUNTER — Other Ambulatory Visit: Payer: Medicare Other | Attending: FAMILY MEDICINE

## 2021-07-13 ENCOUNTER — Other Ambulatory Visit: Payer: Self-pay

## 2021-07-13 DIAGNOSIS — M069 Rheumatoid arthritis, unspecified: Secondary | ICD-10-CM | POA: Insufficient documentation

## 2021-07-14 LAB — RHEUMATOID FACTOR, SERUM: RHEUMATOID FACTOR: <13 IU/mL (ref <=30)

## 2021-07-19 ENCOUNTER — Encounter (INDEPENDENT_AMBULATORY_CARE_PROVIDER_SITE_OTHER): Payer: Self-pay | Admitting: Internal Medicine

## 2021-07-20 ENCOUNTER — Encounter (INDEPENDENT_AMBULATORY_CARE_PROVIDER_SITE_OTHER): Payer: Self-pay | Admitting: FAMILY MEDICINE

## 2021-07-20 ENCOUNTER — Ambulatory Visit: Payer: Medicare Other | Attending: FAMILY MEDICINE | Admitting: FAMILY MEDICINE

## 2021-07-20 ENCOUNTER — Other Ambulatory Visit: Payer: Self-pay

## 2021-07-20 VITALS — BP 110/70 | HR 70 | Temp 97.5°F | Resp 16 | Ht 66.0 in | Wt 104.6 lb

## 2021-07-20 DIAGNOSIS — I10 Essential (primary) hypertension: Secondary | ICD-10-CM | POA: Insufficient documentation

## 2021-07-20 DIAGNOSIS — S72401A Unspecified fracture of lower end of right femur, initial encounter for closed fracture: Secondary | ICD-10-CM | POA: Insufficient documentation

## 2021-07-20 DIAGNOSIS — I951 Orthostatic hypotension: Secondary | ICD-10-CM | POA: Insufficient documentation

## 2021-07-20 DIAGNOSIS — R627 Adult failure to thrive: Secondary | ICD-10-CM | POA: Insufficient documentation

## 2021-07-20 DIAGNOSIS — Z681 Body mass index (BMI) 19 or less, adult: Secondary | ICD-10-CM | POA: Insufficient documentation

## 2021-07-20 DIAGNOSIS — J449 Chronic obstructive pulmonary disease, unspecified: Secondary | ICD-10-CM | POA: Insufficient documentation

## 2021-07-20 DIAGNOSIS — M069 Rheumatoid arthritis, unspecified: Secondary | ICD-10-CM | POA: Insufficient documentation

## 2021-07-20 DIAGNOSIS — E440 Moderate protein-calorie malnutrition: Secondary | ICD-10-CM | POA: Insufficient documentation

## 2021-07-20 DIAGNOSIS — R6251 Failure to thrive (child): Secondary | ICD-10-CM | POA: Insufficient documentation

## 2021-07-20 MED ORDER — PREDNISONE 10 MG TABLET
ORAL_TABLET | ORAL | 0 refills | Status: AC
Start: 2021-07-20 — End: 2021-07-26

## 2021-07-20 MED ORDER — METOPROLOL SUCCINATE ER 50 MG TABLET,EXTENDED RELEASE 24 HR
50.0000 mg | ORAL_TABLET | Freq: Every day | ORAL | 0 refills | Status: DC
Start: 2021-07-20 — End: 2021-10-16

## 2021-07-20 NOTE — Progress Notes (Signed)
PRIMARY CARE, MEDICAL OFFICE BUILDING [place] [id number]      Operated by [facility]     Name: Theresa Greene MRN:  [id number]   Date: 07/20/2021 Age: 86 y.o.      Chief Complaint   Patient presents with   . Blood Pressure Check       SUBJECTIVE    Patient is 86 y.o. she  presents today for reported blood pressure issues.  Her daughter is with her today.  Reports she has had this kind of issue in the past.  She had to be admitted to a nursing home rehab facility to get stronger in the past.  She has a history of orthostatic hypotension.  She reports that she has a cardiologist in the cardiologist had her on certain medicines and then her primary care doctor changed does medicines and she got better after some time.  She has been having orthostatic hypotension again.  She has lost weight she just has not felt very good this seems to have gotten worse recently.  She is supposed to be drinking Gatorade making sure she gets enough fluid she just started drinking some Gatorade.  Most of the time she gets the orthostatic hypotension when she stands up.  She has not been squeezing her legs or buttocks when she gets up.  Avoid courage her to do this the daughter will encourage her to do this also they are going to get a note book today to start rechecking her blood pressures and report these back to me in 1 week.  Her daughter lives out of state.  She has another daughter who also lives out of state.  The husband does help at home.  I advised them patient and daughter I will order rehab for physical therapy if they want me too.  In the meantime I will decrease her Toprol her and I want her to eat more and try to gain some of her weight back.  The daughter reports she just has not been eating very well.  Patient does not like Ensure any of those type things.    ROS:      ROS - pertinent for presenting problem.  Systems otherwise negative than what has been  noted.    PAST MEDICAL HISTORY  I have reviewed and updated as appropriate the past medical, surgical, family, and social history today:    Medical History/Surgical History/Family History/Social History  Past Medical History:   Diagnosis Date   . CAD S/P percutaneous coronary angioplasty 08/06/2014    Pt reports 5 years ago   . Charcot's joint of foot, left 08/06/2014   . Gastroesophageal reflux disease 08/06/2014   . History of cardiac pacemaker 08/06/2014   . History of CVA (cerebrovascular accident) 08/06/2014    1995, no residual deficits   . HTN (hypertension)    . Hyperlipidemia 12/25/2019   . Neuropathy (CMS HCC)    . Paroxysmal atrial fibrillation (CMS HCC) 08/06/2014   . PUD (peptic ulcer disease) 08/06/2014    C/b GIB Spring 2015   . Rheumatoid arthritis (CMS [place]) 08/06/2014   . Stage 3 chronic kidney disease (CMS [place]) 12/25/2019    Last Assessment & Plan:  Renal function stable, at baseline Stable electrolytes; restart sodium bicarb 650 mg BID  Continue renal protective ARB Discussed CKD risk factor modifications, including tight BP/glucose control, low protein diet, and avoidance of NSAIDs   . Type 2 diabetes mellitus (CMS  [place]) 08/06/2014   . Vitamin D deficiency 12/25/2019         Past Surgical History:   Procedure Laterality Date   . CORONARY ARTERY ANGIOPLASTY  2011   . HX BACK SURGERY      lower back x 3   . HX HIP REPLACEMENT Right    . HX NISSEN FUNDOPLICATION  8916    then three others to repair problems   . [place]    with common duxt exploration   . HX PACEMAKER INSERTION  2019   . KNEE ARTHROPLASTY Right    . OPEN REDUCTION INTERNAL FIXATION FRACTURE FEMUR Right 05/07/2020    Performed by Jacqualine Code, MD at [place]   . PANCREATIC PSEUDOCYST DRAINAGE  1971   . TOTAL ABDOMINAL HYSTERECTOMY       Family Medical History:     Problem Relation (Age of Onset)    Ovarian Cancer Mother    Pancreatic Cancer Father           Social History     Socioeconomic History   .  Marital status: Married   . Number of children: 2   Tobacco Use   . Smoking status: Never   . Smokeless tobacco: Never   Vaping Use   . Vaping Use: Never used   Substance and Sexual Activity   . Alcohol use: Never   . Drug use: Never   Social History Narrative    Lives at home with husband. 2 grown daughters - an Therapist, sports and a Ecologist. She did Librarian, academic for 23 years.      Social Determinants of Health     Financial Resource Strain: Low Risk    . Difficulty of Paying Living Expenses: Not hard at all   Food Insecurity: No Food Insecurity   . Worried About Charity fundraiser in the Last Year: Never true   . Ran Out of Food in the Last Year: Never true   Transportation Needs: No Transportation Needs   . Lack of Transportation (Medical): No   . Lack of Transportation (Non-Medical): No   Physical Activity: Inactive   . Days of Exercise per Week: 0 days   . Minutes of Exercise per Session: 0 min   Stress: No Stress Concern Present   . Feeling of Stress : Not at all   Intimate Partner Violence: Not At Risk   . Fear of Current or Ex-Partner: No   . Emotionally Abused: No   . Physically Abused: No   . Sexually Abused: No   Housing Stability: Unknown   . Unable to Pay for Housing in the Last Year: No   . Unstable Housing in the Last Year: No       Allergies:  Allergies   Allergen Reactions   . Iodinated Contrast Media Anaphylaxis   . Etodolac NO Steroids unless approved by Attending Physician   . Iv Contrast      makese her serverly sick   . Codeine  Other Adverse Reaction (Add comment) and Nausea/ Vomiting     Stomach pain  Stomach pain         Medications:  Current Outpatient Medications   Medication Sig   . amiodarone (PACERONE) 200 mg Oral Tablet Take 1 Tablet (200 mg total) by mouth Once a day for 90 days (Patient taking differently: Take 100 mg by mouth Once a day)   . amLODIPine (NORVASC) 5 mg Oral Tablet  Take 1 Tablet (5 mg total) by mouth Once a day   . atorvastatin (LIPITOR) 20 mg Oral Tablet Take 1  Tablet (20 mg total) by mouth Once a day for 90 days   . carboxymethylcellulose sodium (REFRESH OPHT) Administer into affected eye(s)   . clopidogreL (PLAVIX) 75 mg Oral Tablet Take 1 Tablet (75 mg total) by mouth Once a day for 90 days   . CREON 36,000-114,000- 180,000 unit Oral Capsule, Delayed Release(E.C.)    . ezetimibe (ZETIA) 10 mg Oral Tablet Take 1 Tablet (10 mg total) by mouth Once a day for 90 days   . gabapentin (NEURONTIN) 100 mg Oral Capsule Take 1 Capsule (100 mg total) by mouth Twice daily   . gemfibroziL (LOPID) 600 mg Oral Tablet Take 1 Tablet (600 mg total) by mouth Twice a day before meals for 90 days   . losartan (COZAAR) 50 mg Oral Tablet Take 0.5 Tablets (25 mg total) by mouth Twice daily for 90 days (Patient taking differently: Take 1 Tablet (50 mg total) by mouth Once a day)   . metoprolol succinate (TOPROL-XL) 100 mg Oral Tablet Sustained Release 24 hr Take 1 Tablet (100 mg total) by mouth Once a day   . metoprolol succinate (TOPROL-XL) 50 mg Oral Tablet Sustained Release 24 hr Take 1 Tablet (50 mg total) by mouth Once a day   . predniSONE (DELTASONE) 10 mg Oral Tablet Take PO, 3 tabs daily for 2 days, then, 2 tabs daily for 2 days, then, 1 tab daily for 2 days, then stop   . sodium bicarbonate 650 mg Oral Tablet Take 1 Tablet (650 mg total) by mouth Twice daily       Immunizations:  Immunization History   Administered Date(s) Administered   . Covid-19 Vaccine,Moderna Bivalent Booster,49yr+ 04/20/2021   . Covid-19 Vaccine,Moderna,12 Years+ 09/17/2019, 10/15/2019, 06/20/2020   . High-Dose Influenza Vaccine, 65+ 04/22/2019   . Influenza Vaccine, 6 month-adult 05/10/2008   . Influenza Vaccine, 65+ 05/11/2020   . PREVNAR 13 06/11/2016   . Pneumovax 03/10/2021       OBJECTIVE    BP 110/70 (Site: Left, Patient Position: Standing, Cuff Size: Adult)   Pulse 70   Temp 36.4 C (97.5 F)   Resp 16   Ht 1.676 m (5' 6" )   Wt 47.4 kg (104 lb 9.6 oz)   SpO2 96%   BMI 16.88 kg/m         Nursing  Notes:   WMorton Amy LPN  [id number] Signed  Body mass index is 16.88 kg/m.    Fall Risk Assessment  Do you feel unsteady when standing or walking?: Yes  Do you worry about falling?: Yes  Have you fallen in the past year?: No  Timed up and go test (in seconds): 15    PHQ Questionnaire  Little interest or pleasure in doing things.: Not at all  Feeling down, depressed, or hopeless: Not at all  PHQ 2 Total: 0  Interpretation of Total Score: 0-4 No depression         Travel Screening     Question Response    In the last 10 days, have you been in contact with someone who was confirmed or suspected to have Coronavirus/COVID-19? No / Unsure    Have you had a COVID-19 viral test in the last 10 days? No    Do you have any of the following new or worsening symptoms? None of these    Have you traveled  internationally or domestically in the last month? No      Travel History   Travel since 06/20/21    No documented travel since 06/20/21       Functional Health Screening:   Patient is under 18: No  Have you had a recent unexplained weight loss or gain?: No  Because we are aware of abuse and domestic violence today, we ask all patients: Are you being hurt, hit, or frightened by anyone at your home or in your life?: No  Do you have any basic needs within your home that are not being met? (such as Food, Shelter, Games developer, Transportation): Yes  Patient is under 18 and therefore has no Advance Directives: No  Patient has: Living Will, MPOA  Patient has Advance Directive: Yes         Morton Amy, LPN  13/09/4399, 02:72    Morton Amy, LPN  53/66/44 0347  Signed     07/20/21 0800   PHQ 9 (follow up)   Little interest or pleasure in doing things. 0   Feeling down, depressed, or hopeless 0   Interpretation of Total Score No depression       Morton Amy, LPN  42/59/56 3875  Signed     07/20/21 0800   Fall Risk Assessment   Do you feel unsteady when standing or walking? Yes   Do you worry about falling? Yes   Have you  fallen in the past year? No   Timed up and go test (in seconds) 15        Physical Exam:  General: Pleasant, WNWD, NAD, AAO,frail elderly  Head: Normocephalic, AT,no lesions.   Eyes: watery eyes, conjunctivae clear.  Ears: EAC's hearing aid left mild cerumen, TM's pearly gray, No d/c or effusions.  Nose:  no rhinnorhea noted, no lesions  Throat: Clear, no exudates, no lesions MMM and pink.  Neck: Supple, no masses, no thyromegaly, no bruits.   Chest: Lungs clear, no rales, no rhonchi, no wheezes. Pack maker.  Heart: RRR, no murmurs, no rubs, no gallops.  .   Abdomen: Soft, no tenderness, no masses, BS normal.   Extremities: using walker slow to rise from chair with assistance gait, no deformities, no edema.    Neuro: Physiological, no localizing findings. .  Skin:  Normal PWD, no rashes, no lesions noted.   Psych: Mood and Affect normal      ASSESSMENT:        ICD-10-CM    1. Primary hypertension  I10 metoprolol succinate (TOPROL-XL) 50 mg Oral Tablet Sustained Release 24 hr      2. Failure to thrive (child)  R62.51       3. Orthostatic hypotension  I95.1 metoprolol succinate (TOPROL-XL) 50 mg Oral Tablet Sustained Release 24 hr      4. Rheumatoid arthritis, involving unspecified site, unspecified whether rheumatoid factor present (CMS HCC)  M06.9 predniSONE (DELTASONE) 10 mg Oral Tablet      5. Moderate protein-calorie malnutrition (CMS HCC)  E44.0       6. Closed fracture of distal end of right femur (CMS HCC)  S72.401A       7. Chronic obstructive pulmonary disease, unspecified COPD type (CMS HCC)  J44.9         Body mass index is 16.88 kg/m. BMI addressed: Advised on importance of nutrition to increase below normal BMI.         Laboratory studies/data reviewed  I have reviewed all available and pertinent laboratory studies, images  and health maintenance.    PLAN:    Records reviewed today:  Refill needed meds. ATTACHED  Pt was educated/ counseled on the decisions made today with their involvement in these  plans.  Preventive counseling: falls  Diet and exercise reviewed  See dentist and eye doctor with regular scheduled visits.  Active listening/Asked pertinent questions.    A total of (33) minutes was spent on this patient encounter including review of historical information, examination, documentation and post visit activities.  Decrease Toprol XL to 50 mg call in one week with BP readings hold Toprol XL 100 mg while on the 50 mg  Take steroid taper if flair of RA   May need PT again   Advise when you stand you need to stand there and gave your self of minute to get balanced also as yrs standing squeeze the muscles in your legs and year but ox as this may help your orthostatic symptoms.  Keep hydrated and drink some Gatorade.  Follow-up with all specialists as needed and scheduled  If you change your mind about referral call back I will try to order home health or referral for outpatient treatment.  Take prednisone if you have a severe rheumatoid arthritis flare  Continue all prescribed medications as directed    Depression screening is negative though I feel she has depression       BMI addressed: Advised on importance of nutrition to increase below normal BMI.       FOLLOW UP:  Return for Dr.Holly in January .    Patient can return sooner if needed.      The patient/care give was given ample opportunity to ask questions and those questions were answered to his/her satisfaction. A good faith effort was made to reconcile the patient's medications. The patient/caregiver was counseled on any appropriate vaccinations by the provider and questions were answered. The patient/care giver was told to contact me with any additional questions or concerns, or go to the ED in an emergency.       Kerry Dory, DO. 07/20/21 13:05  [facility]  [address]  [place], [place] [address]  Phone [phone]    This note may have been partially generated using M-Modal Fluency Direct system, and there may be some incorrect words,  spellings, and punctuation that were not noted in checking the note before saving.

## 2021-07-20 NOTE — Nursing Note (Signed)
07/20/21 0800   Fall Risk Assessment   Do you feel unsteady when standing or walking? Yes   Do you worry about falling? Yes   Have you fallen in the past year? No   Timed up and go test (in seconds) 15

## 2021-07-20 NOTE — Nursing Note (Signed)
07/20/21 0800   PHQ 9 (follow up)   Little interest or pleasure in doing things. 0   Feeling down, depressed, or hopeless 0   Interpretation of Total Score No depression

## 2021-07-20 NOTE — Nursing Note (Signed)
Body mass index is 16.88 kg/m.    Fall Risk Assessment  Do you feel unsteady when standing or walking?: Yes  Do you worry about falling?: Yes  Have you fallen in the past year?: No  Timed up and go test (in seconds): 15    PHQ Questionnaire  Little interest or pleasure in doing things.: Not at all  Feeling down, depressed, or hopeless: Not at all  PHQ 2 Total: 0  Interpretation of Total Score: 0-4 No depression         Travel Screening     Question Response    In the last 10 days, have you been in contact with someone who was confirmed or suspected to have Coronavirus/COVID-19? No / Unsure    Have you had a COVID-19 viral test in the last 10 days? No    Do you have any of the following new or worsening symptoms? None of these    Have you traveled internationally or domestically in the last month? No      Travel History   Travel since 06/20/21    No documented travel since 06/20/21       Functional Health Screening:   Patient is under 18: No  Have you had a recent unexplained weight loss or gain?: No  Because we are aware of abuse and domestic violence today, we ask all patients: Are you being hurt, hit, or frightened by anyone at your home or in your life?: No  Do you have any basic needs within your home that are not being met? (such as Food, Shelter, Games developer, Transportation): Yes  Patient is under 18 and therefore has no Advance Directives: No  Patient has: Living Will, MPOA  Patient has Advance Directive: Yes         Morton Amy, LPN  80/04/9832, 82:50

## 2021-07-27 ENCOUNTER — Encounter (INDEPENDENT_AMBULATORY_CARE_PROVIDER_SITE_OTHER): Payer: Self-pay | Admitting: FAMILY MEDICINE

## 2021-07-31 ENCOUNTER — Encounter (HOSPITAL_COMMUNITY): Payer: Self-pay | Admitting: Ophthalmology

## 2021-08-11 ENCOUNTER — Encounter (INDEPENDENT_AMBULATORY_CARE_PROVIDER_SITE_OTHER): Payer: Self-pay | Admitting: Internal Medicine

## 2021-08-17 ENCOUNTER — Telehealth (INDEPENDENT_AMBULATORY_CARE_PROVIDER_SITE_OTHER): Payer: Self-pay | Admitting: Internal Medicine

## 2021-08-17 NOTE — Telephone Encounter (Signed)
Patients daughter called in asking about the status of the rheumatology referral.    I called and left a message for the office to call me back about an appointment.

## 2021-08-31 ENCOUNTER — Encounter (INDEPENDENT_AMBULATORY_CARE_PROVIDER_SITE_OTHER): Payer: Self-pay | Admitting: Internal Medicine

## 2021-09-12 ENCOUNTER — Encounter (HOSPITAL_COMMUNITY): Payer: Self-pay | Admitting: Ophthalmology

## 2021-09-20 ENCOUNTER — Other Ambulatory Visit (INDEPENDENT_AMBULATORY_CARE_PROVIDER_SITE_OTHER): Payer: Self-pay | Admitting: Internal Medicine

## 2021-09-20 NOTE — Telephone Encounter (Signed)
She breaks the tablet in half and takes it.

## 2021-09-21 ENCOUNTER — Ambulatory Visit (HOSPITAL_COMMUNITY): Payer: Medicare Other | Admitting: Certified Registered"

## 2021-09-21 ENCOUNTER — Encounter (HOSPITAL_COMMUNITY): Payer: Medicare Other | Admitting: Ophthalmology

## 2021-09-21 ENCOUNTER — Inpatient Hospital Stay
Admission: RE | Admit: 2021-09-21 | Discharge: 2021-09-21 | Disposition: A | Discharge status: Home / Self Care | Payer: Medicare Other | Source: Ambulatory Visit | Attending: Ophthalmology | Admitting: Ophthalmology

## 2021-09-21 ENCOUNTER — Encounter (HOSPITAL_COMMUNITY)
Admission: RE | Disposition: A | Discharge status: Home / Self Care | Payer: Self-pay | Source: Ambulatory Visit | Attending: Ophthalmology

## 2021-09-21 ENCOUNTER — Other Ambulatory Visit: Payer: Self-pay

## 2021-09-21 DIAGNOSIS — H02402 Unspecified ptosis of left eyelid: Secondary | ICD-10-CM | POA: Insufficient documentation

## 2021-09-21 DIAGNOSIS — R9431 Abnormal electrocardiogram [ECG] [EKG]: Secondary | ICD-10-CM | POA: Insufficient documentation

## 2021-09-21 DIAGNOSIS — H02002 Unspecified entropion of right lower eyelid: Secondary | ICD-10-CM | POA: Insufficient documentation

## 2021-09-21 HISTORY — DX: Presence of coronary angioplasty implant and graft: Z95.5

## 2021-09-21 HISTORY — DX: Presence of spectacles and contact lenses: Z97.3

## 2021-09-21 HISTORY — DX: Unspecified osteoarthritis, unspecified site: M19.90

## 2021-09-21 HISTORY — DX: Personal history of other diseases of the nervous system and sense organs: Z86.69

## 2021-09-21 HISTORY — DX: Diverticulitis of large intestine without perforation or abscess without bleeding: K57.32

## 2021-09-21 HISTORY — DX: Unspecified asthma, uncomplicated: J45.909

## 2021-09-21 HISTORY — DX: Unspecified hearing loss, unspecified ear: H91.90

## 2021-09-21 HISTORY — DX: Polyp of colon: K63.5

## 2021-09-21 HISTORY — DX: Muscle weakness (generalized): M62.81

## 2021-09-21 HISTORY — DX: Presence of cardiac pacemaker: Z95.0

## 2021-09-21 HISTORY — DX: Dorsopathy, unspecified: M53.9

## 2021-09-21 LAB — ECG 12-LEAD
Atrial Rate: 70 {beats}/min
Calculated P Axis: 55 degrees
Calculated R Axis: 83 degrees
Calculated T Axis: 88 degrees
PR Interval: 294 ms
QRS Duration: 78 ms
QT Interval: 402 ms
QTC Calculation: 434 ms
Ventricular rate: 70 {beats}/min

## 2021-09-21 SURGERY — REPAIR PTOSIS
Anesthesia: Monitor Anesthesia Care | Laterality: Right | Wound class: Clean Wound: Uninfected operative wounds in which no inflammation occurred

## 2021-09-21 MED ORDER — LIDOCAINE 20 MG/ML (2 %)-EPINEPHRINE 1:100,000 INJECTION SOLUTION
Freq: Once | INTRAMUSCULAR | Status: DC | PRN
Start: 2021-09-21 — End: 2021-09-21
  Administered 2021-09-21: 10 mL via INTRAMUSCULAR

## 2021-09-21 MED ORDER — ERYTHROMYCIN 5 MG/GRAM (0.5 %) EYE OINTMENT
TOPICAL_OINTMENT | Freq: Once | OPHTHALMIC | Status: DC | PRN
Start: 2021-09-21 — End: 2021-09-21
  Administered 2021-09-21: 5 mg via OPHTHALMIC

## 2021-09-21 MED ORDER — PROPOFOL 10 MG/ML IV BOLUS
INJECTION | Freq: Once | INTRAVENOUS | Status: DC | PRN
Start: 2021-09-21 — End: 2021-09-21
  Administered 2021-09-21: 20 mg via INTRAVENOUS

## 2021-09-21 MED ORDER — ONDANSETRON HCL (PF) 4 MG/2 ML INJECTION SOLUTION
INTRAMUSCULAR | Status: AC
Start: 2021-09-21 — End: 2021-09-21
  Filled 2021-09-21: qty 2

## 2021-09-21 MED ORDER — SODIUM CHLORIDE 0.9 % INTRAVENOUS SOLUTION
INTRAVENOUS | Status: DC | PRN
Start: 2021-09-21 — End: 2021-09-21

## 2021-09-21 MED ORDER — LIDOCAINE (PF) 3.5 % EYE GEL
Freq: Once | OPHTHALMIC | Status: DC | PRN
Start: 2021-09-21 — End: 2021-09-21
  Administered 2021-09-21: 2 [drp] via OPHTHALMIC

## 2021-09-21 MED ORDER — PROPOFOL 10 MG/ML INTRAVENOUS EMULSION
INTRAVENOUS | Status: AC
Start: 2021-09-21 — End: 2021-09-21
  Filled 2021-09-21: qty 20

## 2021-09-21 MED ORDER — ERYTHROMYCIN 5 MG/GRAM (0.5 %) EYE OINTMENT
TOPICAL_OINTMENT | OPHTHALMIC | Status: AC
Start: 2021-09-21 — End: 2021-09-21
  Filled 2021-09-21: qty 1

## 2021-09-21 MED ORDER — LIDOCAINE 20 MG/ML (2 %)-EPINEPHRINE 1:100,000 INJECTION SOLUTION
INTRAMUSCULAR | Status: AC
Start: 2021-09-21 — End: 2021-09-21
  Filled 2021-09-21: qty 20

## 2021-09-21 MED ORDER — DEXMEDETOMIDINE 200 MCG/50 ML (4 MCG/ML) IN 0.9 % SODIUM CHLORIDE IV
Freq: Once | INTRAVENOUS | Status: DC | PRN
Start: 2021-09-21 — End: 2021-09-21
  Administered 2021-09-21: 12 ug via INTRAVENOUS

## 2021-09-21 MED ORDER — LIDOCAINE (PF) 20 MG/ML (2 %) INJECTION SOLUTION
Freq: Once | INTRAMUSCULAR | Status: DC | PRN
Start: 2021-09-21 — End: 2021-09-21
  Administered 2021-09-21: 40 mg via INTRAVENOUS

## 2021-09-21 MED ORDER — ERYTHROMYCIN 5 MG/GRAM (0.5 %) EYE OINTMENT
TOPICAL_OINTMENT | Freq: Three times a day (TID) | OPHTHALMIC | 4 refills | Status: DC
Start: 2021-09-21 — End: 2021-09-25

## 2021-09-21 MED ORDER — LIDOCAINE (PF) 3.5 % EYE GEL
OPHTHALMIC | Status: AC
Start: 2021-09-21 — End: 2021-09-21
  Filled 2021-09-21: qty 1

## 2021-09-21 MED ORDER — FENTANYL (PF) 50 MCG/ML INJECTION SOLUTION
INTRAMUSCULAR | Status: AC
Start: 2021-09-21 — End: 2021-09-21
  Filled 2021-09-21: qty 2

## 2021-09-21 MED ORDER — TETRACAINE 0.5 % EYE DROPS
OPHTHALMIC | Status: AC
Start: 2021-09-21 — End: 2021-09-21
  Filled 2021-09-21: qty 5

## 2021-09-21 MED ORDER — DEXAMETHASONE SODIUM PHOSPHATE (PF) 10 MG/ML INJECTION SOLUTION
INTRAMUSCULAR | Status: AC
Start: 2021-09-21 — End: 2021-09-21
  Filled 2021-09-21: qty 1

## 2021-09-21 MED ORDER — DEXMEDETOMIDINE 200 MCG/50 ML (4 MCG/ML) IN 0.9 % SODIUM CHLORIDE IV
INTRAVENOUS | Status: AC
Start: 2021-09-21 — End: 2021-09-21
  Filled 2021-09-21: qty 50

## 2021-09-21 MED ORDER — ONDANSETRON HCL (PF) 4 MG/2 ML INJECTION SOLUTION
Freq: Once | INTRAMUSCULAR | Status: DC | PRN
Start: 2021-09-21 — End: 2021-09-21
  Administered 2021-09-21: 4 mg via INTRAVENOUS

## 2021-09-21 MED ORDER — DEXAMETHASONE SODIUM PHOSPHATE (PF) 10 MG/ML INJECTION SOLUTION
Freq: Once | INTRAMUSCULAR | Status: DC | PRN
Start: 2021-09-21 — End: 2021-09-21
  Administered 2021-09-21: 10 mg via INTRAVENOUS

## 2021-09-21 MED ORDER — SODIUM CHLORIDE 0.9 % INTRAVENOUS SOLUTION
Freq: Once | INTRAVENOUS | Status: AC
Start: 2021-09-21 — End: 2021-09-21

## 2021-09-21 MED ORDER — LIDOCAINE (PF) 20 MG/ML (2 %) INJECTION SOLUTION
INTRAMUSCULAR | Status: AC
Start: 2021-09-21 — End: 2021-09-21
  Filled 2021-09-21: qty 5

## 2021-09-21 MED ORDER — FENTANYL (PF) 50 MCG/ML INJECTION SOLUTION
Freq: Once | INTRAMUSCULAR | Status: DC | PRN
Start: 2021-09-21 — End: 2021-09-21
  Administered 2021-09-21: 25 ug via INTRAVENOUS

## 2021-09-21 MED ORDER — TETRACAINE 0.5 % EYE DROPS
Freq: Once | OPHTHALMIC | Status: DC | PRN
Start: 2021-09-21 — End: 2021-09-21
  Administered 2021-09-21: 2 [drp] via OPHTHALMIC

## 2021-09-21 SURGICAL SUPPLY — 48 items
APPL COTTON WD 6IN SWBSTK STRL LF  DISP (MED SURG SUPPLIES) ×8 IMPLANT
APPL FBRTP 6IN STRL LF COTTON_WD (MED SURG SUPPLIES) ×4
CAUTERY OPTH VARI TEMP HOT_AA11 10EA/BX (CUTTING ELEMENTS) ×2
CAUTERY OPTH VARI TEMP HOT_AA11 10EA/BX (SURGICAL CUTTING SUPPLIES) ×4 IMPLANT
CONV USE 135507 - NEEDLE HYPO  18GA 1IN MONOJECT 2 LOCK BVL ORT INTGR SAF (MED SURG SUPPLIES) ×4 IMPLANT
CONV USE 23866 - NEEDLE HYPO 27GA 1.5IN STD MONOJECT SS POLYPROP REG BVL LL HUB UL SHRP ANTICORE YW STRL LF  DISP (MED SURG SUPPLIES) ×4 IMPLANT
CONV USE 48069 - SYRINGE 5ML LF  STRL LL TIP MED (MED SURG SUPPLIES) ×4 IMPLANT
CONV USE ITEM 306714 - GLOVE SURG 6.5 LF PF SMTH BE_AD CUF STRL NTR 12IN PROTEXIS (GLOVES AND ACCESSORIES) ×2
CONV USE ITEM 308902 - GLOVE SURG 7.5 LF PF BEAD CUF_STRL 12IN PROTEXIS PI PLISPRN (GLOVES AND ACCESSORIES) ×1
CONV USE ITEM 321846 - GLOVE SURG 7.5 LF PF BEAD CUF_STRL 12IN PROTEXIS PI PLISPRN (GLOVES AND ACCESSORIES) ×2 IMPLANT
CONV USE ITEM 321863 - GLOVE SURG 6.5 LF PF SMTH BE_AD CUF STRL NTR 12IN PROTEXIS (GLOVES AND ACCESSORIES) ×4 IMPLANT
CONV USE ITEM 337905 - KIT SURG MIN STUP STRL DISP LF (CUSTOM TRAYS & PACK) IMPLANT
CORD BIPOLAR 12IN SILVERGLIDE STR CABLE STRL LF  DISP (ENDOSCOPIC SUPPLIES) ×2 IMPLANT
CORD BIPOLAR 12IN SILVERGLIDE_CABLE STRL LF DISP (INSTRUMENTS ENDOMECHANICAL) ×2
DUPE USE ITEM 319376 - SUTURE 5-0 S-14 VC SBRLC 18IN_VIOL 2 ARM BRD COAT ABS (SUTURE/WOUND CLOSURE) ×2 IMPLANT
DUPE USE ITEM 319430 - SUTURE SILK 4-0 C-3 PERMAHAND_12IN BLK 2 ARM BRD NONAB (SUTURE/WOUND CLOSURE) ×2 IMPLANT
DUPE USE ITEM 319433 - SUTURE 4-0 P2 VICRYL 18IN UNDY_ED BRD COAT ABS (SUTURE/WOUND CLOSURE) ×2 IMPLANT
GOWN SURG XL AAMI L3 REINF STR_L BACK SET IN SLEEVE STRL LF (DRAPE/PACKS/SHEETS/OR TOWEL) ×1
GOWN SURG XL L3 REINF SET IN SLEEVE STRL LF  DISP BLU ASTND FBRC (DRAPE/PACKS/SHEETS/OR TOWEL) ×2 IMPLANT
GOWN SURG XL XLNG AAMI L4 IMPR_V RGLN SLEEVE BRTHBL STRL LF (DRAPE/PACKS/SHEETS/OR TOWEL) ×1
GOWN SURG XL XLNG L4 RGLN SLEEVE BRTHBL STRL LF  DISP SMARTGOWN (DRAPE/PACKS/SHEETS/OR TOWEL) ×2 IMPLANT
KIT MINOR SURG SET UP_CS/28 (CUSTOM TRAYS & PACK)
KIT SURG MIN STUP STRL DISP LF (CUSTOM TRAYS & PACK)
MBO USE ITEM 306904 - GOWN SURG XL L3 REINF SET IN SLEEVE STRL LF  DISP BLU ASTND FBRC (DRAPE/PACKS/SHEETS/OR TOWEL) ×2
NEEDLE HYPO  18GA 1IN MONOJECT 2 LOCK BVL ORT INTGR SAF (MED SURG SUPPLIES) ×4
NEEDLE HYPO  27GA 1.5IN STD MONOJECT SS POLYPROP REG BVL LL (MED SURG SUPPLIES) ×2
NEEDLE HYPO 18GA 1IN EDGE NEE_DLEPRO JELCO BVL 1 HAND ACT (MED SURG SUPPLIES) ×3 IMPLANT
PACK EYE PREP (CUSTOM TRAYS & PACK) ×1
PACK SURG CSTM EYE PREP STRL DISP LF (CUSTOM TRAYS & PACK) ×2
PACK SURG CUSTOM EYE PREP STRL DISP LF (CUSTOM TRAYS & PACK) ×2 IMPLANT
PACK SURG ECLIPSE EENT II TBL CVR SUT BAG HEAD TRBN DRP 90X50IN 40X27IN LF (CUSTOM TRAYS & PACK) ×2 IMPLANT
PACK SURG EENT II DYNJP7010 (CUSTOM TRAYS & PACK) ×1
SOL IRRG 0.9% NACL 1000ML PLASTIC PR BTL ISTNC N-PYRG STRL LF (MEDICATIONS/SOLUTIONS) ×2 IMPLANT
SOL SURG SCRUB BDINE TOP PVP IOD ANTIMIC PLASTIC BTL FOAM 4OZ (MED SURG SUPPLIES) ×2 IMPLANT
SOL SURG SCRUB BDINE TOP PVP I_OD ANTIMIC PLASTIC BTL FOAM 4 (MED SURG SUPPLIES) ×1
SOLUTION IRRG NS 2F7124 1000CC_12/CS (MEDICATIONS/SOLUTIONS) ×1
SUTURE 4-0 P2 VICRYL 18IN UNDY_ED BRD COAT ABS (SUTURE/WOUND CLOSURE) ×1
SUTURE 5-0 S-14 VC SBRLC 12IN_VIOL 2 ARM BRD COAT ABS (SUTURE/WOUND CLOSURE) ×1
SUTURE 5-0 S-14 VC SBRLC 18IN_VIOL 2 ARM BRD COAT ABS (SUTURE/WOUND CLOSURE) ×1
SUTURE 5-0 S-14 VICRYL SABRELOC 12IN VIOL 2 ARM BRD COAT ABS (SUTURE/WOUND CLOSURE) ×2 IMPLANT
SUTURE PLAIN 6-0 G1 MICROP 18IN TAN MONOF ABS (SUTURE/WOUND CLOSURE) ×2 IMPLANT
SUTURE PLN GUT 6-0 G1 MICROP 1_8IN TAN MONOF ABS (SUTURE/WOUND CLOSURE) ×1
SUTURE SILK 4-0 C-3 PERMAHAND_12IN BLK 2 ARM BRD NONAB (SUTURE/WOUND CLOSURE) ×1
SYRINGE 5ML LF STRL LL TIP ME_D (MED SURG SUPPLIES) ×2
SYRINGE LL 5ML LF STRL GRAD M_ED POLYPROP DISP CLR (MED SURG SUPPLIES) ×1
SYRINGE LL 5ML LF STRL GRAD N-PYRG DEHP-FR PVC FREE MED DISP CLR (MED SURG SUPPLIES) ×2 IMPLANT
TOWEL 27X17IN COTTON PREWASH D_ELINT HI ABS BLU DISP SURG (DRAPE/PACKS/SHEETS/OR TOWEL) ×1
TOWEL 27X17IN COTTON STD PREWASH DELINT HI ABS BLU DISP SURG STRL LF (DRAPE/PACKS/SHEETS/OR TOWEL) ×2 IMPLANT

## 2021-09-21 NOTE — Anesthesia Postprocedure Evaluation (Signed)
Anesthesia Post Op Evaluation    Patient: Theresa Greene  Procedure(s):  REPAIR PTOSIS  REPAIR ECTROPION    Last Vitals:Temperature: 36.3 C (97.4 F) (09/21/21 0903)  Heart Rate: 70 (09/21/21 0918)  BP (Non-Invasive): 117/61 (09/21/21 [id number])  Respiratory Rate: 17 (09/21/21 0918)  SpO2: 95 % (09/21/21 0918)    No notable events documented.      Patient location during evaluation: PACU       Patient participation: complete - patient participated  Level of consciousness: awake and alert and responsive to verbal stimuli    Pain score: 0  Pain management: adequate  Airway patency: patent    Anesthetic complications: no  Cardiovascular status: acceptable  Respiratory status: acceptable and spontaneous ventilation  Hydration status: acceptable  Patient post-procedure temperature: Pt Normothermic   PONV Status: Absent

## 2021-09-21 NOTE — Brief Op Note (Signed)
[facility] - BRIEF OP-NOTE    PATIENT NAME:  Theresa Greene NAME:  [id number]  DATE OF SERVICE:  09/21/2021  DATE OF BIRTH:  1935-07-21    PREOPERATIVE DIAGNOSIS:  Left ptosis, right entropion  POSTOPERATIVE DIAGNOSIS:  SAme    NAME OF PROCEDURE:  Left ptosis repair, right entropion repair  SURGEON:  Kethan Papadopoulos  ASSISTANT:  None    ANESTHESIA:  MAC    ESTIMATED BLOOD LOSS:  5 cc    COMPLICATIONS:  none    DISPOSITION:  Home on self care      Blase Mess, MD 09/21/2021, 09:04    09/21/2021  Discharge Note    Patient meets anesthesia discharge criteria  D/C to home  Resume any current outpatient medications  Use eye medications as instructed  RTC as scheduled    Blase Mess, MD  09/21/2021, 09:04

## 2021-09-21 NOTE — H&P (Signed)
[facility]  History and Physical      Date:  09/21/2021   Theresa Greene, Theresa Greene, 87 y.o. female  Date of Birth:  April 30, 1935    Complaint:   Pre-operative H&P    HPI: Theresa Greene is a 87 y.o., female who presents with  Ptosis and entropion        Past Medical History:   Diagnosis Date   . Arthritis    . Asthma    . Back problem    . CAD S/P percutaneous coronary angioplasty 2010    Pt reports 5 years ago   . Charcot's joint of foot, left 08/06/2014   . Colon polyp    . CVA (cerebrovascular accident) (CMS [place]) 1995    no residual deficits   . Deaf    . Diverticulitis of colon    . Gastroesophageal reflux disease 08/06/2014   . H/O hearing loss    . History of cardiac pacemaker 08/06/2014   . HTN (hypertension)    . Hyperlipidemia 12/25/2019   . Muscle weakness    . Neuropathy (CMS HCC)    . Pacemaker    . Paroxysmal atrial fibrillation (CMS HCC) 08/06/2014   . PUD (peptic ulcer disease) 08/06/2014    C/b GIB Spring 2015   . Rheumatoid arthritis (CMS [place]) 08/06/2014   . Stage 3 chronic kidney disease (CMS [place]) 12/25/2019    Last Assessment & Plan:  Renal function stable, at baseline Stable electrolytes; restart sodium bicarb 650 mg BID  Continue renal protective ARB Discussed CKD risk factor modifications, including tight BP/glucose control, low protein diet, and avoidance of NSAIDs   . Stented coronary artery    . Vitamin D deficiency 12/25/2019   . Wears glasses          Allergies   Allergen Reactions   . Iodinated Contrast Media Anaphylaxis   . Etodolac NO Steroids unless approved by Attending Physician   . Iv Contrast      makese her serverly sick   . Codeine  Other Adverse Reaction (Add comment) and Nausea/ Vomiting     Stomach pain  Stomach pain       Current Facility-Administered Medications   Medication Dose Route Frequency Provider Last Rate Last Admin   . NS premix infusion   Intravenous Once Blase Mess, MD          Social History     Tobacco Use   . Smoking status: Never   .  Smokeless tobacco: Never   Substance Use Topics   . Alcohol use: Never     Family Medical History:     Problem Relation (Age of Onset)    Ovarian Cancer Mother    Pancreatic Cancer Father            [id number] than ROS in the HPI, all other systems were negative.    EXAM:    General: appears in good health  Eyes: Performed at [facility].    HENT:TM's Clear.   Neck: no thyromegaly  Lungs: normal percussion bilaterally  Cardiovascular: regular rate and rhythm: Soft, non-tender  Extremities: No cyanosis or edema    ASSESSMENT/PLAN:    Proceed as planned with right entropion and left ptosis repair    Blase Mess, MD 09/21/2021, 07:41

## 2021-09-21 NOTE — Anesthesia Preprocedure Evaluation (Addendum)
ANESTHESIA PRE-OP EVALUATION  Planned Procedure: REPAIR EPIBLEPHARON (Right)  REPAIR PTOSIS (Left)  Review of Systems     anesthesia history negative     patient summary reviewed  nursing notes reviewed        Pulmonary   COPD and asthma,   Cardiovascular    Pacemaker, hypertension, CAD, ECG reviewed, 08/01/21 Cardiac Clearance per Dr Ovid Curd   09/21/21 EKG; Atrial Paced   05/07/20 Echo; Conclusions:  Left ventricular systolic function is normal.  Normal right ventricular systolic function.  No significant valvular heart disease.    Denies recent chest pain or DOE    , cardiac stents, atrial fibrillation and hyperlipidemia ,No peripheral edema,   ,beta blocker therapy  ,taken in last 24 hours     GI/Hepatic/Renal    GERD and well controlled        Endo/Other    rheumatoid arthritis,      Neuro/Psych/MS    CVA, back abnormality    peripheral neuropathy,  Cancer    negative hematology/oncology ROS,           Physical Assessment      Airway       Mallampati: II    TM distance: >3 FB    Neck ROM: full  Mouth Opening: good.            Dental           (+) upper dentures, lower dentures           Pulmonary    Breath sounds clear to auscultation  (-) no rhonchi, no decreased breath sounds, no wheezes, no rales and no stridor     Cardiovascular    Rhythm: regular  Rate: Normal  (-) no friction rub, carotid bruit is not present, no peripheral edema and no murmur     Other findings            Plan  ASA 3     Planned anesthesia type: MAC                     Intravenous induction     Anesthesia issues/risks discussed are: PONV, Stroke, Intraoperative Awareness/ Recall, Aspiration, Cardiac Events/MI and Post-op Cognitive Dysfunction.  Anesthetic plan and risks discussed with patient  Signed consent obtained            Patient's NPO status is appropriate for Anesthesia.

## 2021-09-21 NOTE — Anesthesia Transfer of Care (Signed)
ANESTHESIA TRANSFER OF CARE   Theresa Greene is a 87 y.o. ,female, Weight: 47.2 kg (104 lb)   had Procedure(s):  REPAIR PTOSIS  REPAIR ECTROPION  performed  09/21/21   Primary Service: Blase Mess,*    Past Medical History:   Diagnosis Date   . Arthritis    . Asthma    . Back problem    . CAD S/P percutaneous coronary angioplasty 2010    Pt reports 5 years ago   . Charcot's joint of foot, left 08/06/2014   . Colon polyp    . CVA (cerebrovascular accident) (CMS [place]) 1995    no residual deficits   . Deaf    . Diverticulitis of colon    . Gastroesophageal reflux disease 08/06/2014   . H/O hearing loss    . History of cardiac pacemaker 08/06/2014   . HTN (hypertension)    . Hyperlipidemia 12/25/2019   . Muscle weakness    . Neuropathy (CMS [facility])    . Pacemaker    . Paroxysmal atrial fibrillation (CMS HCC) 08/06/2014   . PUD (peptic ulcer disease) 08/06/2014    C/b GIB [place] 2015   . Rheumatoid arthritis (CMS [place]) 08/06/2014   . Stage 3 chronic kidney disease (CMS [place]) 12/25/2019    Last Assessment & Plan:  Renal function stable, at baseline Stable electrolytes; restart sodium bicarb 650 mg BID  Continue renal protective ARB Discussed CKD risk factor modifications, including tight BP/glucose control, low protein diet, and avoidance of NSAIDs   . Stented coronary artery    . Vitamin D deficiency 12/25/2019   . Wears glasses       Allergy History as of 09/21/21     CODEINE       Noted Status Severity Type Reaction    04/04/20 1835 Danae Chen, RN 06/09/09 Active Low   Other Adverse Reaction (Add comment), Nausea/ Vomiting    Comments: Stomach pain  Stomach pain             ETODOLAC       Noted Status Severity Type Reaction    04/04/20 1835 Danae Chen, RN 06/09/09 Active   NO Steroids unless approved by Attending Physician          IODINATED CONTRAST MEDIA       Noted Status Severity Type Reaction    04/04/20 1835 Danae Chen, RN 08/06/14 Active High  Anaphylaxis          IV CONTRAST       Noted  Status Severity Type Reaction    11/03/20 1044 Tana Coast, LPN 16/10/96 Active       Comments: makese her serverly sick               I completed my transfer of care / handoff to the receiving personnel during which we discussed:  Access, Airway, All key/critical aspects of case discussed, Analgesia, Antibiotics, Expectation of post procedure, Fluids/Product, Gave opportunity for questions and acknowledgement of understanding, Labs and PMHx    Post Location: PACU                                                                  Last OR Temp: Temperature: 36.4 C (97.6 F)  ABG:  PH (ARTERIAL)   Date Value Ref Range Status   05/07/2020 7.23 (LL) 7.35 - 7.45 Final     PH (T)   Date Value Ref Range Status   05/07/2020 7.30 (L) 7.35 - 7.45 Final     PCO2 (ARTERIAL)   Date Value Ref Range Status   05/07/2020 43.0 35.0 - 45.0 mm/Hg Final     PCO2 (VENOUS)   Date Value Ref Range Status   05/08/2020 38.00 (L) 41.00 - 51.00 mm/Hg Final     PO2 (ARTERIAL)   Date Value Ref Range Status   05/07/2020 226.0 (H) 72.0 - 100.0 mm/Hg Final     PO2 (VENOUS)   Date Value Ref Range Status   05/08/2020 47.0 35.0 - 50.0 mm/Hg Final     SODIUM   Date Value Ref Range Status   05/07/2020 136 (L) 137 - 145 mmol/L Final     POTASSIUM   Date Value Ref Range Status   05/08/2021 4.4 3.5 - 5.1 mmol/L Final     KETONES   Date Value Ref Range Status   02/20/2021 Negative Negative mg/dL Final     WHOLE BLOOD POTASSIUM   Date Value Ref Range Status   05/07/2020 5.5 (H) 3.5 - 4.6 mmol/L Final     CHLORIDE   Date Value Ref Range Status   05/07/2020 107 101 - 111 mmol/L Final     CALCIUM   Date Value Ref Range Status   05/08/2021 9.6 8.8 - 10.2 mg/dL Final     Calculated P Axis   Date Value Ref Range Status   05/07/2020 81 degrees Final     Calculated R Axis   Date Value Ref Range Status   05/07/2020 68 degrees Final     Calculated T Axis   Date Value Ref Range Status   05/07/2020 70 degrees Final     IONIZED CALCIUM   Date Value Ref Range Status    05/10/2020 1.13 1.10 - 1.35 mmol/L Final     LACTATE   Date Value Ref Range Status   05/08/2020 0.9 0.0 - 1.3 mmol/L Final     HEMOGLOBIN   Date Value Ref Range Status   05/07/2020 6.0 (LL) 12.0 - 18.0 g/dL Final     OXYHEMOGLOBIN   Date Value Ref Range Status   05/07/2020 97.9 85.0 - 98.0 % Final     CARBOXYHEMOGLOBIN   Date Value Ref Range Status   05/07/2020 0.3 0.0 - 2.5 % Final     MET-HEMOGLOBIN   Date Value Ref Range Status   05/07/2020 0.4 0.0 - 2.0 % Final     BASE DEFICIT   Date Value Ref Range Status   05/08/2020 7.7 (H) -3.0 - 3.0 mmol/L Final   05/07/2020 9.2 (H) 0.0 - 3.0 mmol/L Final     BICARBONATE (ARTERIAL)   Date Value Ref Range Status   05/07/2020 17.8 (L) 18.0 - 26.0 mmol/L Final     BICARBONATE (VENOUS)   Date Value Ref Range Status   05/08/2020 18.4 (L) 22.0 - 26.0 mmol/L Final     TEMPERATURE, COMP   Date Value Ref Range Status   05/07/2020 35.9 15.0 - 40.0 C Final     %FIO2 (VENOUS)   Date Value Ref Range Status   05/08/2020 21.0 % Final     Airway:* No LDAs found *  Blood pressure (!) 175/95, pulse 70, temperature 36.4 C (97.6 F), resp. rate (!) 21, height 1.676 m (_0 ), weight 47.2 kg (  104 lb), SpO2 98 %.

## 2021-09-21 NOTE — Discharge Instructions (Addendum)
SURGICAL DISCHARGE INSTRUCTIONS     Dr. Annamarie Dawley, Fredrik Rigger, MD  performed your REPAIR EPIBLEPHARON, REPAIR PTOSIS today at the [place]    [facility] ([place])  Monday through Friday 8:00 am - 4:00 pm  [phone]  Between 4:00 pm and 8:00 am, weekends and holidays:  Call [facility] at [phone]    PLEASE SEE WRITTEN HANDOUTS AS DISCUSSED BY YOUR NURSE:      SIGNS AND SYMPTOMS OF A WOUND / INCISION INFECTION   Be sure to watch for the following:  Increase in redness or red streaks near or around the wound or incision.  Increase in pain that is intense or severe and cannot be relieved by the pain medication that your doctor has given you.  Increase in swelling that cannot be relieved by elevation of a body part, or by applying ice, if permitted.  Increase in drainage, or if yellow / green in color and smells bad. This could be on a dressing or a cast.  Increase in fever for longer than 24 hours, or an increase that is higher than 101 degrees Fahrenheit (normal body temperature is 98 degrees Fahrenheit). The incision may feel warm to the touch.    **CALL YOUR DOCTOR IF ONE OR MORE OF THESE SIGNS / SYMPTOMS SHOULD OCCUR.    ANESTHESIA INFORMATION   ANESTHESIA -- ADULT PATIENTS:  You have received intravenous sedation / general anesthesia, and you may feel drowsy and light-headed for several hours. You may even experience some forgetfulness of the procedure. DO NOT DRIVE A MOTOR VEHICLE or perform any activity requiring complete alertness or coordination until you feel fully awake in about 24-48 hours. Do not drink alcoholic beverages for at least 24 hours. Do not stay alone, you must have a responsible adult available to be with you. You may also experience a dry mouth or nausea for 24 hours. This is a normal side effect and will disappear as the effects of the medication wear off.    REMEMBER   If you experience any difficulty breathing, chest pain, bleeding that you  feel is excessive, persistent nausea or vomiting or for any other concerns:  Call your physician Dr.  Blase Mess, MD at [phone] or [phone] You may also ask to have the general doctor on call paged. They are available to you 24 hours a day.    SPECIAL INSTRUCTIONS / COMMENTS   Follow up as scheduled.    FOLLOW-UP APPOINTMENTS   Please call your surgeon's office at the number listed about  to schedule a date / time of return.

## 2021-09-21 NOTE — OR Surgeon (Signed)
[facility] [facility] O[facility]TH[facility] - OPERATIVE NOTE    PATIENT NAME:  Theresa Greene NAME:  [name]  DATE OF SERVICE:  09/21/2021  DATE OF BIRTH:  22-Nov-1934    Pre-Operative Diagnosis: Left ptosis of upper eyelid; Right lower lid entropion  Post-Operative Diagnosis: Same    Procedure(s)/Description:      1. Left levator advancement, external approach, CPT [phone]    The patient was brought back to the operating room and a time out was performed. Once completed, the upper eyelid crease was marked and infiltrated with 2% lidocaine with epinephrine. The patient was then prepped and draped in the usual sterile ophthalmic fashion. A #15 blade was used to incise the skin and Westcott's were used to dissect down to the tarsus inferiorly and the levator aponeurosis superiorly. A double armed 5-0 vicryl was placed through the tarsus and then through the levator aponeurosis in a mattress fashion. The patient was then sat up to assess lid position, which was then adjusted as necessary. Once position was satisfactory, the skin was closed with 6-0 plain gut.    2. Right lower lid entropion repair  Attention was then directed to the right lower eyelid. The lateral canthus was then infiltrated with 2% lidocaine with epinephrine 1:200,000. A lower eyelid lateral tarsal strip procedure was performed. A lateral canthotomy was performed with a #15 blade and the cantholysis completed with Westcott scissors. The eyelid was shortened horizontally via a full-thickness blepharotomy incision. A 4-0 Vicryl was then used to secure the tarsus to the lateral orbital rim periosteum and re-form the lateral canthal angle with the upper lid gray line. An additional orbicularis plication was performed using the 4-0 vicryl suture. A 5-0 fast gut suture was used to close the skin defect. Along the central lower eyelid, a subcilliary incision was made with the #15 blade. Dissection was carried out behind the  orbicularis plane until the lower lid retractors were encountered. These were grasped with forceps and advanced to the anterior surface of tarsus with a 5-0 Vicryl suture. The skin was closed with 6-0 plain gut suture.         Attending Surgeon: Sherren Kerns, MD  Assistant(s): None    Anesthesia Type: MAC  Estimated Blood Loss:  <5cc  Blood Given: None  Fluids Given: Per anesthesia  Complications: None  Characteristic Event: None  Did the use of long term anticoagulant impact the outcome of the case? No  Wound Class: Clean  Tubes: None  Drains: None  Specimens/ Cultures: None  Implants: None            Disposition: PACU, then home  Condition: Stable    Blase Mess, MD  09/21/2021, 09:06

## 2021-09-25 ENCOUNTER — Emergency Department (EMERGENCY_DEPARTMENT_HOSPITAL): Payer: Medicare Other

## 2021-09-25 ENCOUNTER — Encounter (HOSPITAL_COMMUNITY): Payer: Self-pay

## 2021-09-25 ENCOUNTER — Inpatient Hospital Stay
Admission: EM | Admit: 2021-09-25 | Discharge: 2021-09-28 | DRG: 964 | Disposition: A | Discharge status: Other Acute Inpatient Hospital | Payer: Medicare Other | Attending: Internal Medicine | Admitting: Internal Medicine

## 2021-09-25 ENCOUNTER — Other Ambulatory Visit: Payer: Self-pay

## 2021-09-25 DIAGNOSIS — Z9889 Other specified postprocedural states: Secondary | ICD-10-CM

## 2021-09-25 DIAGNOSIS — I10 Essential (primary) hypertension: Secondary | ICD-10-CM

## 2021-09-25 DIAGNOSIS — B961 Klebsiella pneumoniae [K. pneumoniae] as the cause of diseases classified elsewhere: Secondary | ICD-10-CM | POA: Diagnosis present

## 2021-09-25 DIAGNOSIS — Z955 Presence of coronary angioplasty implant and graft: Secondary | ICD-10-CM

## 2021-09-25 DIAGNOSIS — Z7902 Long term (current) use of antithrombotics/antiplatelets: Secondary | ICD-10-CM

## 2021-09-25 DIAGNOSIS — G629 Polyneuropathy, unspecified: Secondary | ICD-10-CM | POA: Diagnosis present

## 2021-09-25 DIAGNOSIS — M810 Age-related osteoporosis without current pathological fracture: Secondary | ICD-10-CM | POA: Diagnosis present

## 2021-09-25 DIAGNOSIS — Z8673 Personal history of transient ischemic attack (TIA), and cerebral infarction without residual deficits: Secondary | ICD-10-CM

## 2021-09-25 DIAGNOSIS — I2510 Atherosclerotic heart disease of native coronary artery without angina pectoris: Secondary | ICD-10-CM | POA: Diagnosis present

## 2021-09-25 DIAGNOSIS — Z01818 Encounter for other preprocedural examination: Secondary | ICD-10-CM

## 2021-09-25 DIAGNOSIS — Z96643 Presence of artificial hip joint, bilateral: Secondary | ICD-10-CM | POA: Diagnosis present

## 2021-09-25 DIAGNOSIS — K279 Peptic ulcer, site unspecified, unspecified as acute or chronic, without hemorrhage or perforation: Secondary | ICD-10-CM

## 2021-09-25 DIAGNOSIS — S72011A Unspecified intracapsular fracture of right femur, initial encounter for closed fracture: Secondary | ICD-10-CM | POA: Insufficient documentation

## 2021-09-25 DIAGNOSIS — S72051A Unspecified fracture of head of right femur, initial encounter for closed fracture: Principal | ICD-10-CM | POA: Diagnosis present

## 2021-09-25 DIAGNOSIS — S32511A Fracture of superior rim of right pubis, initial encounter for closed fracture: Secondary | ICD-10-CM | POA: Diagnosis present

## 2021-09-25 DIAGNOSIS — S72019A Unspecified intracapsular fracture of unspecified femur, initial encounter for closed fracture: Secondary | ICD-10-CM

## 2021-09-25 DIAGNOSIS — S72009A Fracture of unspecified part of neck of unspecified femur, initial encounter for closed fracture: Secondary | ICD-10-CM

## 2021-09-25 DIAGNOSIS — N390 Urinary tract infection, site not specified: Secondary | ICD-10-CM | POA: Diagnosis present

## 2021-09-25 DIAGNOSIS — N183 Chronic kidney disease, stage 3 unspecified: Secondary | ICD-10-CM | POA: Diagnosis present

## 2021-09-25 DIAGNOSIS — Z9861 Coronary angioplasty status: Secondary | ICD-10-CM

## 2021-09-25 DIAGNOSIS — S72091A Other fracture of head and neck of right femur, initial encounter for closed fracture: Secondary | ICD-10-CM

## 2021-09-25 DIAGNOSIS — J45909 Unspecified asthma, uncomplicated: Secondary | ICD-10-CM | POA: Diagnosis present

## 2021-09-25 DIAGNOSIS — M14672 Charcot's joint, left ankle and foot: Secondary | ICD-10-CM | POA: Diagnosis present

## 2021-09-25 DIAGNOSIS — Z79899 Other long term (current) drug therapy: Secondary | ICD-10-CM

## 2021-09-25 DIAGNOSIS — Z8781 Personal history of (healed) traumatic fracture: Secondary | ICD-10-CM

## 2021-09-25 DIAGNOSIS — M545 Low back pain, unspecified: Secondary | ICD-10-CM | POA: Insufficient documentation

## 2021-09-25 DIAGNOSIS — S7001XA Contusion of right hip, initial encounter: Secondary | ICD-10-CM | POA: Insufficient documentation

## 2021-09-25 DIAGNOSIS — W19XXXA Unspecified fall, initial encounter: Secondary | ICD-10-CM | POA: Diagnosis present

## 2021-09-25 DIAGNOSIS — J8283 Eosinophilic asthma: Secondary | ICD-10-CM | POA: Diagnosis present

## 2021-09-25 DIAGNOSIS — M4186 Other forms of scoliosis, lumbar region: Secondary | ICD-10-CM

## 2021-09-25 DIAGNOSIS — Z950 Presence of cardiac pacemaker: Secondary | ICD-10-CM

## 2021-09-25 DIAGNOSIS — S3289XA Fracture of other parts of pelvis, initial encounter for closed fracture: Secondary | ICD-10-CM

## 2021-09-25 DIAGNOSIS — Z96651 Presence of right artificial knee joint: Secondary | ICD-10-CM

## 2021-09-25 DIAGNOSIS — I480 Paroxysmal atrial fibrillation: Secondary | ICD-10-CM | POA: Diagnosis present

## 2021-09-25 DIAGNOSIS — S329XXA Fracture of unspecified parts of lumbosacral spine and pelvis, initial encounter for closed fracture: Secondary | ICD-10-CM

## 2021-09-25 DIAGNOSIS — S79911A Unspecified injury of right hip, initial encounter: Secondary | ICD-10-CM

## 2021-09-25 DIAGNOSIS — R6251 Failure to thrive (child): Secondary | ICD-10-CM

## 2021-09-25 DIAGNOSIS — S32591A Other specified fracture of right pubis, initial encounter for closed fracture: Secondary | ICD-10-CM | POA: Diagnosis present

## 2021-09-25 DIAGNOSIS — G8929 Other chronic pain: Secondary | ICD-10-CM | POA: Insufficient documentation

## 2021-09-25 DIAGNOSIS — M069 Rheumatoid arthritis, unspecified: Secondary | ICD-10-CM | POA: Diagnosis present

## 2021-09-25 DIAGNOSIS — R5381 Other malaise: Secondary | ICD-10-CM | POA: Diagnosis present

## 2021-09-25 DIAGNOSIS — S8991XA Unspecified injury of right lower leg, initial encounter: Secondary | ICD-10-CM

## 2021-09-25 DIAGNOSIS — I129 Hypertensive chronic kidney disease with stage 1 through stage 4 chronic kidney disease, or unspecified chronic kidney disease: Secondary | ICD-10-CM | POA: Diagnosis present

## 2021-09-25 DIAGNOSIS — Z96642 Presence of left artificial hip joint: Secondary | ICD-10-CM

## 2021-09-25 DIAGNOSIS — E785 Hyperlipidemia, unspecified: Secondary | ICD-10-CM | POA: Diagnosis present

## 2021-09-25 DIAGNOSIS — K5730 Diverticulosis of large intestine without perforation or abscess without bleeding: Secondary | ICD-10-CM

## 2021-09-25 DIAGNOSIS — M47816 Spondylosis without myelopathy or radiculopathy, lumbar region: Secondary | ICD-10-CM

## 2021-09-25 DIAGNOSIS — Z96653 Presence of artificial knee joint, bilateral: Secondary | ICD-10-CM | POA: Diagnosis present

## 2021-09-25 DIAGNOSIS — W1830XA Fall on same level, unspecified, initial encounter: Secondary | ICD-10-CM | POA: Insufficient documentation

## 2021-09-25 DIAGNOSIS — M1611 Unilateral primary osteoarthritis, right hip: Secondary | ICD-10-CM

## 2021-09-25 DIAGNOSIS — M8588 Other specified disorders of bone density and structure, other site: Secondary | ICD-10-CM

## 2021-09-25 DIAGNOSIS — K219 Gastro-esophageal reflux disease without esophagitis: Secondary | ICD-10-CM | POA: Diagnosis present

## 2021-09-25 LAB — CBC WITH DIFF
BASOPHIL #: <0.1 10*3/uL (ref <=0.20)
BASOPHIL %: 0 %
EOSINOPHIL #: 0.12 10*3/uL (ref <=0.50)
EOSINOPHIL %: 1 %
HCT: 40.2 % (ref 34.8–46.0)
HGB: 13 g/dL (ref 11.5–16.0)
IMMATURE GRANULOCYTE #: 0.2 10*3/uL — ABNORMAL HIGH (ref <0.10)
IMMATURE GRANULOCYTE %: 2 % — ABNORMAL HIGH (ref 0–1)
LYMPHOCYTE #: 2.28 10*3/uL (ref 1.00–4.80)
LYMPHOCYTE %: 20 %
MCH: 31 pg (ref 26.0–32.0)
MCHC: 32.3 g/dL (ref 31.0–35.5)
MCV: 95.7 fL (ref 78.0–100.0)
MONOCYTE #: 0.97 10*3/uL (ref 0.20–1.10)
MONOCYTE %: 9 %
MPV: 10.3 fL (ref 8.7–12.5)
NEUTROPHIL #: 7.67 10*3/uL (ref 1.50–7.70)
NEUTROPHIL %: 68 %
PLATELETS: 319 10*3/uL (ref 150–400)
RBC: 4.2 10*6/uL (ref 3.85–5.22)
RDW-CV: 15.6 % — ABNORMAL HIGH (ref 11.5–15.5)
WBC: 11.3 10*3/uL — ABNORMAL HIGH (ref 3.7–11.0)

## 2021-09-25 LAB — COMPREHENSIVE METABOLIC PANEL, NON-FASTING
ALBUMIN: 3.3 g/dL — ABNORMAL LOW (ref 3.4–4.8)
ALKALINE PHOSPHATASE: 114 U/L (ref 55–145)
ALT (SGPT): 14 U/L (ref 8–22)
ANION GAP: 12 mmol/L (ref 4–13)
AST (SGOT): 22 U/L (ref 8–45)
BILIRUBIN TOTAL: 0.4 mg/dL (ref 0.3–1.3)
BUN/CREA RATIO: 24 — ABNORMAL HIGH (ref 6–22)
BUN: 41 mg/dL — ABNORMAL HIGH (ref 8–25)
CALCIUM: 9.3 mg/dL (ref 8.8–10.2)
CHLORIDE: 107 mmol/L (ref 96–111)
CO2 TOTAL: 20 mmol/L — ABNORMAL LOW (ref 23–31)
CREATININE: 1.68 mg/dL — ABNORMAL HIGH (ref 0.60–1.05)
ESTIMATED GFR: 30 mL/min/BSA — ABNORMAL LOW (ref >=60)
GLUCOSE: 159 mg/dL — ABNORMAL HIGH (ref 65–125)
POTASSIUM: 4.2 mmol/L (ref 3.5–5.1)
PROTEIN TOTAL: 6.7 g/dL (ref 6.0–8.0)
SODIUM: 139 mmol/L (ref 136–145)

## 2021-09-25 MED ORDER — ONDANSETRON HCL (PF) 4 MG/2 ML INJECTION SOLUTION
4.0000 mg | INTRAMUSCULAR | Status: DC | PRN
Start: 2021-09-25 — End: 2021-09-28
  Administered 2021-09-25 – 2021-09-26 (×3): 4 mg via INTRAVENOUS
  Filled 2021-09-25 (×3): qty 2

## 2021-09-25 MED ORDER — MORPHINE 4 MG/ML INTRAVENOUS SOLUTION
4.0000 mg | INTRAVENOUS | Status: AC
Start: 2021-09-25 — End: 2021-09-25
  Administered 2021-09-25: 4 mg via INTRAVENOUS
  Filled 2021-09-25: qty 1

## 2021-09-25 MED ORDER — AMIODARONE 200 MG TABLET
100.0000 mg | ORAL_TABLET | Freq: Every day | ORAL | Status: DC
Start: 2021-09-26 — End: 2021-09-28
  Administered 2021-09-26 – 2021-09-28 (×3): 100 mg via ORAL
  Filled 2021-09-25 (×3): qty 1

## 2021-09-25 MED ORDER — GABAPENTIN 100 MG CAPSULE
100.0000 mg | ORAL_CAPSULE | Freq: Two times a day (BID) | ORAL | Status: DC
Start: 2021-09-25 — End: 2021-09-28
  Administered 2021-09-25 – 2021-09-28 (×6): 100 mg via ORAL
  Filled 2021-09-25 (×6): qty 1

## 2021-09-25 MED ORDER — HYDROCODONE 5 MG-ACETAMINOPHEN 325 MG TABLET
1.0000 | ORAL_TABLET | ORAL | Status: DC | PRN
Start: 2021-09-25 — End: 2021-09-28
  Administered 2021-09-26 – 2021-09-28 (×8): 1 via ORAL
  Filled 2021-09-25 (×8): qty 1

## 2021-09-25 MED ORDER — LIPASE-PROTEASE-AMYLASE 36,000-114,000-180,000 UNIT CAPSULE,DELAY REL
1.0000 | DELAYED_RELEASE_CAPSULE | Freq: Three times a day (TID) | ORAL | Status: DC
Start: 2021-09-26 — End: 2021-09-28
  Administered 2021-09-26: 0 via ORAL
  Administered 2021-09-26: 1 via ORAL
  Administered 2021-09-26: 0 via ORAL
  Administered 2021-09-27 – 2021-09-28 (×5): 1 via ORAL

## 2021-09-25 MED ORDER — SODIUM CHLORIDE 0.9 % (FLUSH) INJECTION SYRINGE
10.0000 mL | INJECTION | INTRAMUSCULAR | Status: DC | PRN
Start: 2021-09-25 — End: 2021-09-28

## 2021-09-25 MED ORDER — HYDROMORPHONE (PF) 0.5 MG/0.5 ML INJECTION SYRINGE
0.5000 mg | INJECTION | INTRAMUSCULAR | Status: DC | PRN
Start: 2021-09-25 — End: 2021-09-28
  Administered 2021-09-25 – 2021-09-26 (×3): 0.5 mg via INTRAVENOUS
  Filled 2021-09-25 (×3): qty 0.5

## 2021-09-25 MED ORDER — ONDANSETRON HCL (PF) 4 MG/2 ML INJECTION SOLUTION
4.0000 mg | INTRAMUSCULAR | Status: AC
Start: 2021-09-25 — End: 2021-09-25
  Administered 2021-09-25: 4 mg via INTRAVENOUS
  Filled 2021-09-25: qty 2

## 2021-09-25 MED ORDER — SODIUM CHLORIDE 0.9 % (FLUSH) INJECTION SYRINGE
10.0000 mL | INJECTION | Freq: Three times a day (TID) | INTRAMUSCULAR | Status: DC
Start: 2021-09-25 — End: 2021-09-28
  Administered 2021-09-25: 10 mL
  Administered 2021-09-26: 0 mL
  Administered 2021-09-26 – 2021-09-27 (×3): 10 mL
  Administered 2021-09-27: 0 mL
  Administered 2021-09-27 – 2021-09-28 (×3): 10 mL

## 2021-09-25 MED ORDER — PANTOPRAZOLE 40 MG TABLET,DELAYED RELEASE
40.0000 mg | DELAYED_RELEASE_TABLET | Freq: Every day | ORAL | Status: DC
Start: 2021-09-26 — End: 2021-09-28
  Administered 2021-09-26 – 2021-09-28 (×3): 40 mg via ORAL
  Filled 2021-09-25 (×3): qty 1

## 2021-09-25 MED ORDER — AMLODIPINE 5 MG TABLET
5.0000 mg | ORAL_TABLET | Freq: Every day | ORAL | Status: DC
Start: 2021-09-26 — End: 2021-09-28
  Administered 2021-09-26 – 2021-09-28 (×3): 5 mg via ORAL
  Filled 2021-09-25 (×3): qty 1

## 2021-09-25 MED ORDER — ACETAMINOPHEN 325 MG TABLET
650.0000 mg | ORAL_TABLET | Freq: Four times a day (QID) | ORAL | Status: DC | PRN
Start: 2021-09-25 — End: 2021-09-28

## 2021-09-25 MED ORDER — METOPROLOL SUCCINATE ER 50 MG TABLET,EXTENDED RELEASE 24 HR
100.0000 mg | ORAL_TABLET | Freq: Every day | ORAL | Status: DC
Start: 2021-09-26 — End: 2021-09-28
  Administered 2021-09-26 – 2021-09-28 (×3): 100 mg via ORAL
  Filled 2021-09-25 (×3): qty 2

## 2021-09-25 MED ORDER — SODIUM BICARBONATE 650 MG TABLET
650.0000 mg | ORAL_TABLET | Freq: Two times a day (BID) | ORAL | Status: AC
Start: 2021-09-25 — End: 2021-09-28
  Administered 2021-09-25 – 2021-09-28 (×6): 650 mg via ORAL
  Filled 2021-09-25 (×6): qty 1

## 2021-09-25 MED ORDER — CLOPIDOGREL 75 MG TABLET
75.0000 mg | ORAL_TABLET | Freq: Every day | ORAL | Status: DC
Start: 2021-09-26 — End: 2021-09-28
  Administered 2021-09-26 – 2021-09-28 (×3): 75 mg via ORAL
  Filled 2021-09-25 (×3): qty 1

## 2021-09-25 MED ORDER — ENOXAPARIN 30 MG/0.3 ML SUBCUTANEOUS SYRINGE
30.0000 mg | INJECTION | Freq: Every day | SUBCUTANEOUS | Status: DC
Start: 2021-09-26 — End: 2021-09-28
  Administered 2021-09-26 – 2021-09-28 (×3): 30 mg via SUBCUTANEOUS
  Filled 2021-09-25 (×3): qty 0.3

## 2021-09-25 MED ORDER — LOSARTAN 50 MG TABLET
50.0000 mg | ORAL_TABLET | Freq: Every day | ORAL | Status: DC
Start: 2021-09-26 — End: 2021-09-28
  Administered 2021-09-26 – 2021-09-28 (×3): 50 mg via ORAL
  Filled 2021-09-25 (×3): qty 1

## 2021-09-25 MED ORDER — EZETIMIBE 10 MG TABLET
10.0000 mg | ORAL_TABLET | Freq: Every day | ORAL | Status: DC
Start: 2021-09-26 — End: 2021-09-28
  Administered 2021-09-26 – 2021-09-28 (×3): 10 mg via ORAL
  Filled 2021-09-25 (×3): qty 1

## 2021-09-25 MED ORDER — ATORVASTATIN 10 MG TABLET
20.0000 mg | ORAL_TABLET | Freq: Every day | ORAL | Status: DC
Start: 2021-09-26 — End: 2021-09-28
  Administered 2021-09-26 – 2021-09-28 (×3): 20 mg via ORAL
  Filled 2021-09-25 (×3): qty 2

## 2021-09-25 MED ORDER — DOCUSATE SODIUM 100 MG CAPSULE
100.0000 mg | ORAL_CAPSULE | Freq: Two times a day (BID) | ORAL | Status: DC
Start: 2021-09-25 — End: 2021-09-28
  Administered 2021-09-25 – 2021-09-28 (×6): 100 mg via ORAL
  Filled 2021-09-25 (×6): qty 1

## 2021-09-25 MED ORDER — ERGOCALCIFEROL (VITAMIN D2) 1,250 MCG (50,000 UNIT) CAPSULE
50000.0000 [IU] | ORAL_CAPSULE | ORAL | Status: DC
Start: 2021-09-25 — End: 2021-09-28
  Administered 2021-09-25: 0 [IU] via ORAL

## 2021-09-25 NOTE — H&P (Signed)
[facility]  Admission H&P        Date of Service:  09/25/2021  Theresa Greene [id number] y.o. female  Date of Admission:  09/25/2021  Date of Birth:  Jun 04, 1935    Chief Complaint:  Right hip pain    HPI: Theresa Greene is a 87 y.o., White female who presented earlier today with complaint of right hip pain after fall.  Patient states she had just returned from an appointment with her pulmonologist followed by a stop but the grocery store.  She was placing milk and a refrigerator when she turned quickly and lost her balance striking concrete floor with her right hip region.  She denies any head trauma or loss of consciousness.  Patient is noted have some blood and postsurgical changes to her left eyelid after surgery last week.   Patient had severe pain was brought to the emergency room where she was found to have a right femoral head fracture along with a right superior inferior pelvic rami fracture.  Is noted that she had a previous right hip fracture with surgical repair at [facility] just over a year ago.  However, due to the marked pain patient was unable to bear weight or ambulate emergency room and admission was requested.  Patient states she has been steadily weak but denies any recent syncope, presyncope or new chest pain.  Patient does complain of some mild nausea but states her pain is fairly well controlled at this time.  History:    Past Medical:    Past Medical History:   Diagnosis Date   . Arthritis    . Asthma    . Back problem    . CAD S/P percutaneous coronary angioplasty 2010    Pt reports 5 years ago   . Charcot's joint of foot, left 08/06/2014   . Colon polyp    . CVA (cerebrovascular accident) (CMS [place]) 1995    no residual deficits   . Deaf    . Diverticulitis of colon    . Gastroesophageal reflux disease 08/06/2014   . H/O hearing loss    . History of cardiac pacemaker 08/06/2014   . HTN (hypertension)    . Hyperlipidemia 12/25/2019   . Muscle weakness    . Neuropathy (CMS HCC)     . Pacemaker    . Paroxysmal atrial fibrillation (CMS HCC) 08/06/2014   . PUD (peptic ulcer disease) 08/06/2014    C/b GIB [place] 2015   . Rheumatoid arthritis (CMS [place]) 08/06/2014   . Stage 3 chronic kidney disease (CMS [place]) 12/25/2019    Last Assessment & Plan:  Renal function stable, at baseline Stable electrolytes; restart sodium bicarb 650 mg BID  Continue renal protective ARB Discussed CKD risk factor modifications, including tight BP/glucose control, low protein diet, and avoidance of NSAIDs   . Stented coronary artery    . Vitamin D deficiency 12/25/2019   . Wears glasses      Past Surgical:    Past Surgical History:   Procedure Laterality Date   . ANKLE SURGERY Left     repair with metal   . CATARACT EXTRACTION      unsure which eye but thinks she only had one eye done   . CORONARY ARTERY ANGIOPLASTY  2011   . ESOPHAGOGASTRODUODENOSCOPY     . HIP SURGERY Right     repair with metal   . HX BACK SURGERY      lower back x 3 no metal   .  HX COLONOSCOPY     . HX HIP REPLACEMENT Left    . HX NISSEN FUNDOPLICATION  [id number]    then three others to repair problems   . [place]    with common duxt exploration   . HX PACEMAKER INSERTION  2019   . HX SHOULDER SURGERY Bilateral     scopes   . KNEE ARTHROPLASTY Bilateral    . 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 PANCREATIC PSEUDOCYST DRAINAGE  1971   . TOTAL ABDOMINAL HYSTERECTOMY       Family:    Family Medical History:     Problem Relation (Age of Onset)    Ovarian Cancer Mother    Pancreatic Cancer Father        Social:   reports that she has never smoked. She has never used smokeless tobacco. She reports that she does not drink alcohol and does not use drugs.    Allergies   Allergen Reactions   . Iodinated Contrast Media Anaphylaxis   . Etodolac NO Steroids unless approved by Attending Physician   . Iv Contrast      makese her serverly sick   . Symbicort [Budesonide-Formoterol]    . Codeine   Other Adverse Reaction (Add comment) and Nausea/ Vomiting     Stomach pain  Stomach pain       Medications Prior to Admission     Prescriptions    amiodarone (PACERONE) 200 mg Oral Tablet    Take 0.5 Tablets (100 mg total) by mouth Once a day for 90 days    amLODIPine (NORVASC) 5 mg Oral Tablet    Take 1 Tablet (5 mg total) by mouth Once a day    atorvastatin (LIPITOR) 20 mg Oral Tablet    Take 1 Tablet (20 mg total) by mouth Once a day for 90 days    clopidogreL (PLAVIX) 75 mg Oral Tablet    Take 1 Tablet (75 mg total) by mouth Once a day for 90 days    CREON 36,000-114,000- 180,000 unit Oral Capsule, Delayed Release(E.C.)    1 Capsule Three times daily with meals    Dexlansoprazole (DEXILANT) 60 mg Oral Cap, Delayed Rel., Multiphasic    Take 1 Capsule (60 mg total) by mouth Once a day    dicyclomine HCl (BENTYL ORAL)    Take 60 mg by mouth Once a day    ergocalciferol, vitamin D2, (DRISDOL) 1,250 mcg (50,000 unit) Oral Capsule    Take 1 Capsule (50,000 Units total) by mouth Every 7 days    ezetimibe (ZETIA) 10 mg Oral Tablet    Take 1 Tablet (10 mg total) by mouth Once a day for 90 days    gabapentin (NEURONTIN) 100 mg Oral Capsule    Take 1 Capsule (100 mg total) by mouth Twice daily    gemfibroziL (LOPID) 600 mg Oral Tablet    Take 1 Tablet (600 mg total) by mouth Twice a day before meals for 90 days    losartan (COZAAR) 50 mg Oral Tablet    Take 0.5 Tablets (25 mg total) by mouth Twice daily for 90 days    metoprolol succinate (TOPROL-XL) 100 mg Oral Tablet Sustained Release 24 hr    Take 1 Tablet (100 mg total) by mouth Once a day    metoprolol tartrate (LOPRESSOR) 25 mg Oral Tablet    Take 1 Tablet (25 mg  total) by mouth Once a day    sodium bicarbonate 650 mg Oral Tablet    Take 1 Tablet (650 mg total) by mouth Twice daily        acetaminophen (TYLENOL) tablet, 650 mg, Oral, Q6H PRN  [START ON 09/26/2021] amiodarone (CORDARONE) tablet, 100 mg, Oral, Daily  [START ON 09/26/2021] amLODIPine (NORVASC) tablet, 5  mg, Oral, Daily  [START ON 09/26/2021] atorvastatin (LIPITOR) tablet, 20 mg, Oral, Daily  [START ON 09/26/2021] clopidogrel (PLAVIX) 75 mg tablet, 75 mg, Oral, Daily  docusate sodium (COLACE) capsule, 100 mg, Oral, 2x/day  [START ON 09/26/2021] enoxaparin PF (LOVENOX) 30 mg/0.3 mL SubQ injection, 30 mg, Subcutaneous, Daily  ergocalciferol-vitamin D2 (DRISDOL) capsule, 50,000 Units, Oral, Q7 Days  [START ON 09/26/2021] ezetimibe (ZETIA) tablet, 10 mg, Oral, Daily  gabapentin (NEURONTIN) capsule, 100 mg, Oral, 2x/day  HYDROcodone-acetaminophen (NORCO) 5-325 mg per tablet, 1 Tablet, Oral, Q4H PRN  HYDROmorphone (DILAUDID) 0.5 mg/0.5 mL injection, 0.5 mg, Intravenous, Q3H PRN  [START ON 09/26/2021] losartan (COZAAR) tablet, 50 mg, Oral, Daily  [START ON 09/26/2021] metoprolol succinate (TOPROL-XL) 24 hr extended release tablet, 100 mg, Oral, Daily  ondansetron (ZOFRAN) 2 mg/mL injection, 4 mg, Intravenous, Q4H PRN  [START ON 09/26/2021] pancreatic enzyme replacement (CREON) 36,000 units lipase per cap, 1 Capsule, Oral, 3x/day-Meals  [START ON 09/26/2021] pantoprazole (PROTONIX) delayed release tablet, 40 mg, Oral, Daily  sodium bicarbonate tablet, 650 mg, Oral, 2x/day        ROS:   Review of Systems   Constitutional: Positive for malaise/fatigue and weight loss. Negative for chills and fever.   HENT: Negative.    Eyes: Negative.    Respiratory: Negative for cough, hemoptysis, sputum production, shortness of breath and wheezing.    Cardiovascular: Negative for chest pain, palpitations, orthopnea, claudication, leg swelling and PND.   Gastrointestinal: Negative for abdominal pain, blood in stool, constipation, diarrhea, heartburn, melena, nausea and vomiting.   Genitourinary: Negative for dysuria, flank pain, frequency, hematuria and urgency.   Musculoskeletal: Positive for falls, joint pain and myalgias. Negative for back pain and neck pain.   Skin: Negative.    Neurological: Positive for weakness. Negative for dizziness,  tingling, tremors, sensory change, speech change, focal weakness, seizures, loss of consciousness and headaches.   Endo/Heme/Allergies: Negative for environmental allergies and polydipsia. Does not bruise/bleed easily.   Psychiatric/Behavioral: Negative for depression, hallucinations, memory loss, substance abuse and suicidal ideas. The patient is not nervous/anxious and does not have insomnia.        All other systems negative unless marked.         Exam:  Vitals:    09/25/21 1800 09/25/21 1900 09/25/21 1930 09/25/21 2113   BP: (!) 150/84 (!) 164/76 (!) 151/80    Pulse: 70      Resp: 19   18   Temp:       SpO2: 94% 91% 96%    Weight:       Height:       BMI:                 Intake/Output Summary (Last 24 hours) at 09/25/2021 2130  Last data filed at 09/25/2021 2000  Gross per 24 hour   Intake 0 ml   Output --   Net 0 ml          Physical Exam  Vitals reviewed.   Constitutional:       Appearance: She is ill-appearing.   HENT:      Head: Normocephalic and  atraumatic.      Comments: Patient does have some minimal blood stains to her right eye region and postsurgical changes to her left lid after she apparently had surgery to her eye in [place] last week     Right Ear: Tympanic membrane normal.      Left Ear: Tympanic membrane normal.      Mouth/Throat:      Mouth: Mucous membranes are moist.   Eyes:      Extraocular Movements: Extraocular movements intact.      Pupils: Pupils are equal, round, and reactive to light.      Comments: As noted postsurgical changes to her left lid   Neck:      Vascular: No carotid bruit.   Cardiovascular:      Rate and Rhythm: Normal rate and regular rhythm.      Pulses: Normal pulses.      Heart sounds: Normal heart sounds. No murmur heard.    No friction rub. No gallop.      Comments: Rhythm appears paced which is confirmed on the EKG  Pulmonary:      Effort: Pulmonary effort is normal. No respiratory distress.      Breath sounds: No stridor. Rhonchi present. No rales.      Comments:  Patient has minimal fine rhonchi but without rales, crackles or signs of cardiac decompensation.  Chest:      Chest wall: No tenderness.   Abdominal:      General: Abdomen is flat. Bowel sounds are normal. There is no distension.      Palpations: Abdomen is soft. There is no mass.      Tenderness: There is no abdominal tenderness. There is no right CVA tenderness, left CVA tenderness, guarding or rebound.      Hernia: No hernia is present.   Musculoskeletal:         General: Tenderness and signs of injury present. No swelling.      Cervical back: Normal range of motion and neck supple. No rigidity or tenderness.      Right lower leg: No edema.      Left lower leg: No edema.      Comments: Patient has no obvious deformity to her pelvic region and minimal to no crepitus on examination.  She does have marked tenderness over her right greater trochanter region without definite deformity noted.  There is no ecchymosis or skin breakdown.  There is no obvious shortening of the right leg although there is some rotation   Lymphadenopathy:      Cervical: No cervical adenopathy.   Skin:     General: Skin is warm and dry.      Capillary Refill: Capillary refill takes less than 2 seconds.      Coloration: Skin is jaundiced. Skin is not pale.      Findings: Bruising present. No erythema, lesion or rash.   Neurological:      General: No focal deficit present.      Mental Status: She is alert and oriented to person, place, and time.      Cranial Nerves: No cranial nerve deficit.      Sensory: No sensory deficit.      Motor: Weakness present.      Coordination: Coordination abnormal.      Gait: Gait abnormal.      Deep Tendon Reflexes: Reflexes normal.   Psychiatric:         Mood and Affect: Mood normal.  Behavior: Behavior normal.         Thought Content: Thought content normal.         Judgment: Judgment normal.          Labs:     Results for orders placed or performed during the hospital encounter of 09/25/21 (from the  past 24 hour(s))   URINALYSIS WITH REFLEX MICROSCOPIC AND CULTURE IF POSITIVE    Specimen: Urine, Site not specified    Narrative    The following orders were created for panel order URINALYSIS WITH REFLEX MICROSCOPIC AND CULTURE IF POSITIVE.  Procedure                               Abnormality         Status                     ---------                               -----------         ------                     URINALYSIS, MACRO/MICRO[[id number]]                                                       Please view results for these tests on the individual orders.   COMPREHENSIVE METABOLIC PANEL, NON-FASTING   Result Value Ref Range    SODIUM 139 136 - 145 mmol/L    POTASSIUM 4.2 3.5 - 5.1 mmol/L    CHLORIDE 107 96 - 111 mmol/L    CO2 TOTAL 20 (L) 23 - 31 mmol/L    ANION GAP 12 4 - 13 mmol/L    BUN 41 (H) 8 - 25 mg/dL    CREATININE 1.68 (H) 0.60 - 1.05 mg/dL    BUN/CREA RATIO 24 (H) 6 - 22    ESTIMATED GFR 30 (L) >=60 mL/min/BSA    ALBUMIN 3.3 (L) 3.4 - 4.8 g/dL     CALCIUM 9.3 8.8 - 10.2 mg/dL    GLUCOSE 159 (H) 65 - 125 mg/dL    ALKALINE PHOSPHATASE 114 55 - 145 U/L    ALT (SGPT) 14 8 - 22 U/L    AST (SGOT)  22 8 - 45 U/L    BILIRUBIN TOTAL 0.4 0.3 - 1.3 mg/dL    PROTEIN TOTAL 6.7 6.0 - 8.0 g/dL   CBC/DIFF    Narrative    The following orders were created for panel order CBC/DIFF.  Procedure                               Abnormality         Status                     ---------                               -----------         ------  CBC WITH FT:[id number]                Abnormal            Final result                 Please view results for these tests on the individual orders.   CBC WITH DIFF   Result Value Ref Range    WBC 11.3 (H) 3.7 - 11.0 x10^3/uL    RBC 4.20 3.85 - 5.22 x10^6/uL    HGB 13.0 11.5 - 16.0 g/dL    HCT 40.2 34.8 - 46.0 %    MCV 95.7 78.0 - 100.0 fL    MCH 31.0 26.0 - 32.0 pg    MCHC 32.3 31.0 - 35.5 g/dL    RDW-CV 15.6 (H) 11.5 - 15.5 %    PLATELETS 319 150 - 400 x10^3/uL     MPV 10.3 8.7 - 12.5 fL    NEUTROPHIL % 68 %    LYMPHOCYTE % 20 %    MONOCYTE % 9 %    EOSINOPHIL % 1 %    BASOPHIL % 0 %    NEUTROPHIL # 7.67 1.50 - 7.70 x10^3/uL    LYMPHOCYTE # 2.28 1.00 - 4.80 x10^3/uL    MONOCYTE # 0.97 0.20 - 1.10 x10^3/uL    EOSINOPHIL # 0.12 <=0.50 x10^3/uL    BASOPHIL # <0.10 <=0.20 x10^3/uL    IMMATURE GRANULOCYTE % 2 (H) 0 - 1 %    IMMATURE GRANULOCYTE # 0.20 (H) <0.10 x10^3/uL        Imaging Studies:    CT PELVIS WO IV CONTRAST   Final Result      1. Non-displaced subcapital fracture of the right femoral neck with overlying subcutaneous soft tissue hematoma measuring up to 6.5 cm.      2. Non-displaced right inferior and superior pubic rami fractures.            Radiologist location ID: [id number]         XR HIP RIGHT W PELVIS 2-3 VIEWS   Final Result      1. Apparent cortical irregularity and angulated margins at the right femoral head-neck junction, suspicious for a nondisplaced subcapital fracture. Further characterization with CT may be helpful.      2. Nondisplaced right superior pubic ramus fracture.            Radiologist location ID: [id number]         XR KNEE RIGHT 2 VIEW   Final Result      Old, healed distal right femur fracture without evidence for a new acute bony abnormality in the right knee.               Radiologist location ID: [id number]         XR LUMBAR SPINE SERIES   Final Result      No acute fractures are visible, although, the diminished bone marrow density decreases the sensitivity for detection of nondisplaced fractures. If there is clinical concern for a radiographically occult lumbar fracture, MRI is recommended if clinically feasible.               Radiologist location ID: [id number]         XR AP MOBILE CHEST   Final Result      Chronic changes without evidence for an acute cardiopulmonary abnormality.               Radiologist location ID: [id number]  DNR Status:  Prior    Assessment/Plan:   Active Hospital Problems    Diagnosis   . Primary Problem:  Hip fracture (CMS HCC)   . Pelvic fracture (CMS HCC)   . Debility     Patient is hemodynamically stable this time but has marked ambulatory dysfunction secondary to pelvic fracture and a femoral neck fracture.  She will be admitted to the hospital service due to her debility.  I will consult Dr. Owens Shark physical therapy and place and on DVT prophylaxis.  She will have pain management and rehabilitation.  Case Management will be consulted for discharge planning.  Is noted she was at [facility] approximately 1 year ago.  As well, she states that her daughter who is an Therapist, sports is coming up from [place] to take care of her as well.    DVT/PE Prophylaxis:  Lovenox  Disposition Planning:  Pending Orthopedic, physical therapy, and case management evaluation    Rex Kras, DO    This note was partially generated using MModal Fluency Direct system, and there may be some incorrect words, spellings, and punctuation that were not noted in checking the note before saving

## 2021-09-25 NOTE — ED Provider Notes (Signed)
[name]  ED Primary Provider Note  History of Present Illness   Chief Complaint   Patient presents with    Theresa Greene is a 87 y.o. female who had concerns including Fall.  Arrival: The patient arrived by Ambulance and is alone    Patient had an accidental fall just prior to arrival and injured her right hip.  She has a history of chronic low back pain and a right hip replacement.  Complains of severe pain to the right hip and proximal femur area.  No numbness or tingling.  Worse with any movement.  Also has some lower back discomfort.  Denies any dizziness, syncope.  Did not hit her head.  Small amount of swelling over the right hip area        Review of Systems   Review of Systems   Constitutional: Negative for chills and fever.   Respiratory: Negative for cough and shortness of breath.    Cardiovascular: Negative for chest pain.   Gastrointestinal: Negative for abdominal pain.   Musculoskeletal: Positive for back pain.        See HPI   Neurological: Negative for syncope and numbness.     Historical Data   History Reviewed This Encounter: Medical History   Surgical History   Family History   Social History      Physical Exam   ED Triage Vitals   BP (Non-Invasive) 09/25/21 1525 (!) 154/93   Heart Rate 09/25/21 1524 70   Respiratory Rate 09/25/21 1525 19   Temperature 09/25/21 1530 36.6 C (97.9 F)   SpO2 09/25/21 1525 97 %   Weight 09/25/21 1525 52.3 kg (115 lb 4.8 oz)   Height 09/25/21 1525 1.676 m (5\' 6" )     Physical Exam  Vitals and nursing note reviewed.   Constitutional:       General: She is not in acute distress.     Appearance: She is well-developed.   HENT:      Head: Normocephalic and atraumatic.   Eyes:      Conjunctiva/sclera: Conjunctivae normal.      Comments: Right eye with some scleral irritation.  States this is present secondary to recent eye surgery performed 3-5 days ago.   Cardiovascular:      Rate and Rhythm: Normal rate and regular rhythm.       Heart sounds: No murmur heard.  Pulmonary:      Effort: Pulmonary effort is normal. No respiratory distress.      Breath sounds: Normal breath sounds.   Abdominal:      Palpations: Abdomen is soft.      Tenderness: There is no abdominal tenderness.   Musculoskeletal:         General: No swelling.      Cervical back: Neck supple.      Comments: Patient has some tenderness along the lower lumbar spine without step-off or deformity.  Also has tenderness over the greater trochanter area of the right hip with severe pain on any movement at the hip.  Distal pulse/motion/sensation intact.   Skin:     General: Skin is warm and dry.      Capillary Refill: Capillary refill takes less than 2 seconds.   Neurological:      Mental Status: She is alert and oriented to person, place, and time.   Psychiatric:         Mood and Affect: Mood normal.  Patient Data     Labs Ordered/Reviewed   COMPREHENSIVE METABOLIC PANEL, NON-FASTING - Abnormal; Notable for the following components:       Result Value    CO2 TOTAL 20 (*)     BUN 41 (*)     CREATININE 1.68 (*)     BUN/CREA RATIO 24 (*)     ESTIMATED GFR 30 (*)     ALBUMIN 3.3 (*)     GLUCOSE 159 (*)     All other components within normal limits   CBC WITH DIFF - Abnormal; Notable for the following components:    WBC 11.3 (*)     RDW-CV 15.6 (*)     IMMATURE GRANULOCYTE % 2 (*)     IMMATURE GRANULOCYTE # 0.20 (*)     All other components within normal limits   CBC/DIFF    Narrative:     The following orders were created for panel order CBC/DIFF.  Procedure                               Abnormality         Status                     ---------                               -----------         ------                     CBC WITH FT:[id number]                Abnormal            Final result                 Please view results for these tests on the individual orders.     CT PELVIS WO IV CONTRAST   Final Result by Edi, Radresults In (02/13 1842)      1. Non-displaced subcapital  fracture of the right femoral neck with overlying subcutaneous soft tissue hematoma measuring up to 6.5 cm.      2. Non-displaced right inferior and superior pubic rami fractures.            Radiologist location ID: [id number]         XR HIP RIGHT W PELVIS 2-3 VIEWS   Final Result by Edi, Radresults In (02/13 1705)      1. Apparent cortical irregularity and angulated margins at the right femoral head-neck junction, suspicious for a nondisplaced subcapital fracture. Further characterization with CT may be helpful.      2. Nondisplaced right superior pubic ramus fracture.            Radiologist location ID: [id number]         XR KNEE RIGHT 2 VIEW   Final Result by Edi, Radresults In (02/13 1706)      Old, healed distal right femur fracture without evidence for a new acute bony abnormality in the right knee.               Radiologist location ID: [id number]         XR LUMBAR SPINE SERIES   Final Result by Edi, Radresults In (02/13 1657)      No acute fractures are visible, although, the diminished  bone marrow density decreases the sensitivity for detection of nondisplaced fractures. If there is clinical concern for a radiographically occult lumbar fracture, MRI is recommended if clinically feasible.               Radiologist location ID: [id number]         XR AP MOBILE CHEST   Final Result by Edi, Radresults In (02/13 1656)      Chronic changes without evidence for an acute cardiopulmonary abnormality.               Radiologist location ID: [place] Decision Making      Medical Decision Making  Patient received intermittent doses of morphine in the ED for her discomfort.  Laboratory studies essentially baseline for patient with no abnormalities felt to be clinically significant.  X-ray of the right hip and pelvis shows question of a subcapital fracture of the right hip as well as a superior ramus fracture in the pelvis.  Radiology recommended CT scan to better evaluate the right hip.  On the the previous  superior ramus fracture was noted as well as a nondisplaced inferior ramus fracture.  Had also confirm the subcapital fracture of the right hip.  Spoke with Dr. Owens Shark, orthopedics who felt like neither of these would be operative in nature and will consult as needed.  Patient is unable to bear weight secondary to pain and will require admission for pain control and likely swing bed verses rehab placement for physical therapy.  Discussed need for admission with patient and she is agreeable.  Dr. Graylon Good excepted the patient to med surg    Accidental fall, initial encounter: acute illness or injury  Closed fracture of other parts of pelvis, initial encounter (CMS [facility]): acute illness or injury  Hematoma of right hip: acute illness or injury  Subcapital fracture of hip (CMS [facility]): acute illness or injury that poses a threat to life or bodily functions  Amount and/or Complexity of Data Reviewed  Labs: ordered.  Radiology: ordered.  ECG/medicine tests: ordered.  Discussion of management or test interpretation with external provider(s): Discussed case with Dr. Owens Shark, orthopedics as well as Dr. Graylon Good, internal Medicine.  Patient accepted for admission by Dr. Graylon Good    Risk  OTC drugs.  Prescription drug management.  Parenteral controlled substances.  Decision regarding hospitalization.               Medications Administered in the ED   morphine 4 mg/mL injection (4 mg Intravenous Given 09/25/21 1559)   ondansetron (ZOFRAN) 2 mg/mL injection (4 mg Intravenous Given 09/25/21 1559)   morphine 4 mg/mL injection (4 mg Intravenous Given 09/25/21 1740)     Patient will be transferred [facility] for further workup and management.    Disposition: Admitted

## 2021-09-25 NOTE — Care Plan (Signed)
[facility]  Care Plan Note    Situation: IPOC initiated       Winn Muehl, RN

## 2021-09-25 NOTE — ED Nurses Note (Addendum)
Patient had 1 episode of urination using fracture pan. Was unable to utilize it for specimen that is ordered d/t fecal contamination.

## 2021-09-25 NOTE — ED Triage Notes (Signed)
Pt arrives to ED via EMS. Ems reports "fall from standing at her home. Injured R hip. Hx of R femur fracture with rod placed about 1-2 years ago" Pt reports R hip pain and lower back pain. Pt denies LOC or hitting head.

## 2021-09-26 ENCOUNTER — Encounter (HOSPITAL_COMMUNITY): Payer: Self-pay | Admitting: Internal Medicine

## 2021-09-26 LAB — URINALYSIS, MACRO/MICRO
BILIRUBIN: NEGATIVE mg/dL
GLUCOSE: NEGATIVE mg/dL
KETONES: NEGATIVE mg/dL
NITRITE: NEGATIVE
PH: 5.5 (ref 4.6–8.0)
PROTEIN: 30 mg/dL — AB
SPECIFIC GRAVITY: 1.025 (ref 1.005–1.030)
UROBILINOGEN: 0.2 mg/dL (ref 0.2–1.0)

## 2021-09-26 LAB — URINALYSIS, MICROSCOPIC

## 2021-09-26 NOTE — Care Plan (Addendum)
Pt approached for physical therapy session. Pt lethargic and unable to actively participate at this time. Chair set up at bedside for patient. Pt's husband denies any needs. Pt resting in bed with alarm engaged and call light and hydration in reach.     [name] DPT [phone]

## 2021-09-26 NOTE — Nurses Notes (Signed)
Patient's husband to bring in [place]on

## 2021-09-26 NOTE — Care Plan (Addendum)
[place]  Physical Therapy Progress Note    Patient Name: Theresa Greene  Date of Birth: February 15, 1935  Height:  167.6 cm (_0 )  Weight:  49.1 kg (108 lb 3.2 oz)    Room/Bed: 701/B  Payor: Payor: MEDICARE / Plan: MEDICARE PART A AND B / Product Type: Medicare /     Referring physician: No ref. provider found  Pertinent History of Current Functional Problem: 87 y.o. female s/p fall with subsequent R superior and inferior pubic rami fractures and nondisplaced subcaptial femoral neck fracture perviously fixated. Nonoperative treatment recommended with orders for WBAT on RLE. 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    Start Time:1620  End Time: 1642  Total Treatment Time: 22 minutes    Pt Start Position/alarms: Pt supine in bed. Alarm engaged.  Pt End Position/alarms:  Pt up in chair and positioned for comfort with call light, phone and hydration in reach. All needs met. Alarm in place.       Assessment:       Pt actively participated in skilled PT session focused on transfer training supine to sit and sit <> stand and gait training with FWW.  Pt able to access EOB with MOD A with head of bed elevated and rail with increased time to control pain and to improve her ability to actively assist. Pt reports she has slept in a recliner at home since her previous hip injury in 2022. Once on EOB, pt able to complete sit to stand with MIN A and prepare for gait training with re-education on sequencing with pt initially requiring MOD A assist with weight shift during swing phase on LLE with progression to a MIN A level with training. Pt progressed gait distance to 10 ft this session but was limited by pain and fatigue. Ongoing services warranted to improve pt's RLE strength, to improve her independence with bed mobility and transfers, to improve her gait  mechanics and independence with need to progress to ambulating  household distances and to progress pt to negotiating stairs in order to access her home at discharge. Pt would benefit from inpatient rehabilitation stay prior to discharge home with home health.     Discharge Needs:   Equipment Recommendation: TBD    Discharge Disposition: To be determined    Plan:   Continue to follow patient according to established plan of care.  The risks/benefits of therapy have been discussed with the patient/caregiver and he/she is in agreement with the established plan of care.     Subjective & Objective:     Pt agreeable to session. Pt able to access EOB with MOD A with head of bed elevated and rail with increased time to control pain and to improve her ability to actively assist. Once on EOB, pt able to complete sit to stand with MIN A and prepare for gait training with re-education on sequencing with pt initially requiring MOD A assist with weight shift during swing phase on LLE with progression to a MIN A level with training. Pt requested to donn brief during session with pt requiring placement of brief on LE and with pt able to pull up brief with MIN A once pulled to thigh level. Pt limited by pain and fatigue but actively participated in session and is motivated to progress to a functional level to  return home with her husband. Pt reports her daughter, who is a retired Therapist, sports, is moving home and will be available to assist her at discharge. Transition to inpatient rehab recommended to promote pt's independence with transfers, to progress her independence with ambulation household distances and to progress pt to stair training in order to access her home. O2 94% on room air during session with BP 133/74. RN notified of pt's performance and on being up in chair with alarm in place.     Pain: Pt reports no pain at rest and 9/10 pain in R hip with weightbearing activities.     Supine to Sit: Mod Assist  Sit to Stand: Min  Assist  Stand to Sit: Technical sales engineer  Ambulation Device: Conservation officer, nature  Ambulation Assist: Audiological scientist, Mod Assist  Ambulation Distance: 10 ft       Physical Therapy Goals:   Bed Mobility Goal, Activity Type: supine to sit/sit to supine, roll left/roll right  Bed Mobility Goal, Independence Level: modified independence   Bed Mobility Goal, Assistive Device: least restrictive assistive device    Transfer Training Goal, Activity Type: bed-to-chair/chair-to-bed, sit-to-stand/stand-to-sit  Transfer Training Goal, Independence Level: modified independence   Transfer Training Goal, Assist Device: walker, rolling    Gait Training  Goal, Independence Level: stand-by assistance   Gait Training  Goal, Assist Device: walker, rolling   Gait Training  Goal, Distance to Achieve: 100    Stairs Training Goal, Independence Level: contact guard assist   Stairs Training Goal, Assist Device: handrail, left, handrail, right (One rail)   Stairs Training Goal, Number of Stairs to Achieve: 2      Therapist:   [name], PT [phone]

## 2021-09-26 NOTE — Care Plan (Signed)
[facility]  Care Plan Note    Situation: Fractured hip     Intervention: pain control and therapy    Response: Plan of care is Ongoing      Bostyn Bogie S Elinore Shults, RN    Problem: Adult Inpatient Plan of Care  Goal: Plan of Care Review  Outcome: Ongoing (see interventions/notes)  Goal: Patient-Specific Goal (Individualized)  Outcome: Ongoing (see interventions/notes)  Goal: Absence of Hospital-Acquired Illness or Injury  Outcome: Ongoing (see interventions/notes)  Goal: Optimal Comfort and Wellbeing  Outcome: Ongoing (see interventions/notes)  Goal: Rounds/Family Conference  Outcome: Ongoing (see interventions/notes)     Problem: Health Knowledge, Opportunity to Enhance (Adult,Obstetrics,Pediatric)  Goal: Knowledgeable about Health Subject/Topic  Description: Patient will demonstrate the desired outcomes by discharge/transition of care.  Outcome: Ongoing (see interventions/notes)     Problem: Skin Injury Risk Increased  Goal: Skin Health and Integrity  Outcome: Ongoing (see interventions/notes)     Problem: Pain Acute  Goal: Optimal Pain Control and Function  Outcome: Ongoing (see interventions/notes)     Problem: Fall Injury Risk  Goal: Absence of Fall and Fall-Related Injury  Outcome: Ongoing (see interventions/notes)     Problem: Hip Fracture Medical Management  Goal: Optimal Coping with Change in Health Status  Outcome: Ongoing (see interventions/notes)  Goal: Absence of Bleeding  Outcome: Ongoing (see interventions/notes)  Goal: Effective Bowel Elimination  Outcome: Ongoing (see interventions/notes)  Goal: Baseline Cognitive Function Maintained  Outcome: Ongoing (see interventions/notes)  Goal: Absence of Embolism  Outcome: Ongoing (see interventions/notes)  Goal: Fracture Stability  Outcome: Ongoing (see interventions/notes)  Goal: Optimal Functional Performance  Outcome: Ongoing (see interventions/notes)  Goal: Optimal Pain Control and Function  Outcome: Ongoing (see interventions/notes)  Goal: Effective  Urinary Elimination  Outcome: Ongoing (see interventions/notes)

## 2021-09-26 NOTE — Progress Notes (Signed)
Nutrition review  87 year old female  Diagnosis and history reviewed  IVs as ordered medications reviewed, Lovenox and colace as ordered  Labs reviewed  BUN 41 creatinine 1.68 both elevated  Glucose up 159, albumin 3.3 low  Nausea noted  Cardiac diet intakes 25%, set up help, previous ice chips only, no food allergies  Weight 108 3.2# 5'6" tall underweight  IBW 130#+-10%  Calories 1723 35kcal/kg  Fluids 1723 61ml/kg  Protein 59 1.2gm/kg  Adding supplement  Will follow

## 2021-09-26 NOTE — Care Plan (Signed)
[facility]  Care Plan Note    Situation: hip fracture     Intervention: nursing care, safety measures, pain management    Response: care ongoing      Crista Elliot, RN    Problem: Adult Inpatient Plan of Care  Goal: Plan of Care Review  Outcome: Ongoing (see interventions/notes)  Goal: Patient-Specific Goal (Individualized)  Recent Flowsheet Documentation  Taken 09/26/2021 0800 by Crista Elliot, RN  Individualized Care Needs: nursing care  Anxieties, Fears or Concerns: none voiced  Goal: Absence of Hospital-Acquired Illness or Injury  Intervention: Prevent and Manage VTE (Venous Thromboembolism) Risk  Recent Flowsheet Documentation  Taken 09/26/2021 0800 by Crista Elliot, RN  VTE Prevention/Management:   ambulation promoted   anticoagulant therapy maintained  Goal: Optimal Comfort and Wellbeing  Intervention: Provide Person-Centered Care  Recent Flowsheet Documentation  Taken 09/26/2021 0800 by Crista Elliot, RN  Trust Relationship/Rapport: care explained     Problem: Hip Fracture Medical Management  Goal: Absence of Embolism  Intervention: Prevent or Manage Embolism Risk  Recent Flowsheet Documentation  Taken 09/26/2021 0800 by Crista Elliot, RN  VTE Prevention/Management:   ambulation promoted   anticoagulant therapy maintained

## 2021-09-26 NOTE — Care Management Notes (Signed)
DC planning discussed with patient and her spouse, and both choose [facility] instead of SNF rehab or [facility] rehab. Patient has all SNF days available via [place] online site.

## 2021-09-26 NOTE — Progress Notes (Signed)
[facility]  Progress Note    Theresa Greene  Date of service: 09/26/2021  Date of Admission:  09/25/2021  Hospital Day:  LOS: 1 day     Subjective:  Patient is without complaint this morning.  Her pain is managed.  She does complain of some nausea which I believe possibly is from her pain management    Review of Systems:    Review of Systems   Constitutional: Negative for chills, diaphoresis, fever, malaise/fatigue and weight loss.   HENT: Negative.    Eyes: Negative.    Respiratory: Negative for cough, hemoptysis, sputum production, shortness of breath and wheezing.    Cardiovascular: Negative for chest pain, palpitations, orthopnea, claudication, leg swelling and PND.   Gastrointestinal: Positive for nausea. Negative for abdominal pain, blood in stool, constipation, diarrhea, heartburn, melena and vomiting.   Genitourinary: Negative.  Negative for dysuria, frequency and urgency.   Musculoskeletal: Positive for falls, joint pain and myalgias. Negative for back pain and neck pain.   Skin: Negative.    Neurological: Negative for dizziness, tingling, tremors, sensory change, speech change, focal weakness, seizures, loss of consciousness, weakness and headaches.   Endo/Heme/Allergies: Negative.    Psychiatric/Behavioral: Negative.          Objective:     Vital Signs:  Vitals:    09/26/21 0000 09/26/21 0247 09/26/21 0400 09/26/21 0800   BP: (!) 179/82  (!) 157/65 113/70   Pulse: 70  70 70   Resp: 18 18 18 18    Temp: 36.9 C (98.4 F)  36.6 C (97.9 F) 36.9 C (98.4 F)   SpO2: 93%  98% 98%   Weight:       Height:       BMI:                I/O:  I/O last 24 hours:      Intake/Output Summary (Last 24 hours) at 09/26/2021 0848  Last data filed at 09/26/2021 0200  Gross per 24 hour   Intake 200 ml   Output --   Net 200 ml         acetaminophen (TYLENOL) tablet, 650 mg, Oral, Q6H PRN  amiodarone (CORDARONE) tablet, 100 mg, Oral, Daily  amLODIPine (NORVASC) tablet, 5 mg, Oral, Daily  atorvastatin (LIPITOR)  tablet, 20 mg, Oral, Daily  clopidogrel (PLAVIX) 75 mg tablet, 75 mg, Oral, Daily  docusate sodium (COLACE) capsule, 100 mg, Oral, 2x/day  enoxaparin PF (LOVENOX) 30 mg/0.3 mL SubQ injection, 30 mg, Subcutaneous, Daily  ergocalciferol-vitamin D2 (DRISDOL) capsule, 50,000 Units, Oral, Q7 Days  ezetimibe (ZETIA) tablet, 10 mg, Oral, Daily  gabapentin (NEURONTIN) capsule, 100 mg, Oral, 2x/day  HYDROcodone-acetaminophen (NORCO) 5-325 mg per tablet, 1 Tablet, Oral, Q4H PRN  HYDROmorphone (DILAUDID) 0.5 mg/0.5 mL injection, 0.5 mg, Intravenous, Q3H PRN  losartan (COZAAR) tablet, 50 mg, Oral, Daily  metoprolol succinate (TOPROL-XL) 24 hr extended release tablet, 100 mg, Oral, Daily  NS flush syringe, 10 mL, Intracatheter, Q8HRS  NS flush syringe, 10 mL, Intracatheter, Q1H PRN  ondansetron (ZOFRAN) 2 mg/mL injection, 4 mg, Intravenous, Q4H PRN  pancreatic enzyme replacement (CREON) 36,000 units lipase per cap, 1 Capsule, Oral, 3x/day-Meals  pantoprazole (PROTONIX) delayed release tablet, 40 mg, Oral, Daily  sodium bicarbonate tablet, 650 mg, Oral, 2x/day        Physical Exam:    Physical Exam  Vitals and nursing note reviewed.   Constitutional:       Appearance: She is ill-appearing.   HENT:  Head: Normocephalic and atraumatic.      Right Ear: Tympanic membrane normal.      Left Ear: Tympanic membrane normal.      Nose: Nose normal.      Mouth/Throat:      Mouth: Mucous membranes are moist.   Eyes:      Pupils: Pupils are equal, round, and reactive to light.   Neck:      Vascular: No carotid bruit.   Cardiovascular:      Rate and Rhythm: Normal rate and regular rhythm.      Pulses: Normal pulses.      Heart sounds: Normal heart sounds. No murmur heard.    No friction rub. No gallop.   Pulmonary:      Effort: Pulmonary effort is normal. No respiratory distress.      Breath sounds: Normal breath sounds. No stridor. No wheezing, rhonchi or rales.   Chest:      Chest wall: No tenderness.   Abdominal:      General: Bowel  sounds are normal. There is no distension.      Palpations: Abdomen is soft. There is no mass.      Tenderness: There is no abdominal tenderness. There is no guarding or rebound.      Hernia: No hernia is present.   Musculoskeletal:         General: Tenderness and deformity present. Normal range of motion.      Cervical back: Normal range of motion and neck supple. No rigidity or tenderness.      Comments: Pelvic shows no obvious deformity or crepitus on examination.  She does have marked tenderness over her right hip region.  Noted minimal rotation.   Lymphadenopathy:      Cervical: No cervical adenopathy.   Skin:     General: Skin is warm and dry.      Capillary Refill: Capillary refill takes less than 2 seconds.   Neurological:      General: No focal deficit present.      Mental Status: She is alert and oriented to person, place, and time.      Cranial Nerves: No cranial nerve deficit.      Sensory: No sensory deficit.      Motor: Weakness present.      Coordination: Coordination normal.      Gait: Gait abnormal.      Deep Tendon Reflexes: Reflexes normal.   Psychiatric:         Mood and Affect: Mood normal.         Behavior: Behavior normal.         Thought Content: Thought content normal.         Judgment: Judgment normal.           Labs:  Results for orders placed or performed during the hospital encounter of 09/25/21 (from the past 24 hour(s))   URINALYSIS WITH REFLEX MICROSCOPIC AND CULTURE IF POSITIVE    Specimen: Urine, Site not specified    Narrative    The following orders were created for panel order URINALYSIS WITH REFLEX MICROSCOPIC AND CULTURE IF POSITIVE.  Procedure                               Abnormality         Status                     ---------                               -----------         ------  URINALYSIS, MACRO/MICRO[[id number]]      Abnormal            Final result               URINALYSIS, MICROSCOPIC[[id number]]      Abnormal            Final result                  Please view results for these tests on the individual orders.   URINALYSIS, MACRO/MICRO   Result Value Ref Range    SPECIFIC GRAVITY 1.025 1.005 - 1.030    GLUCOSE Negative Negative mg/dL    PROTEIN 30 (A) Negative mg/dL    BILIRUBIN Negative Negative mg/dL    UROBILINOGEN 0.2 0.2 - 1.0 mg/dL    PH 5.5 4.6 - 8.0    BLOOD Trace (A) Negative mg/dL    KETONES Negative Negative mg/dL    NITRITE Negative Negative    LEUKOCYTES Small (A) Negative WBCs/uL    APPEARANCE Cloudy (A) Clear    COLOR Yellow Yellow   COMPREHENSIVE METABOLIC PANEL, NON-FASTING   Result Value Ref Range    SODIUM 139 136 - 145 mmol/L    POTASSIUM 4.2 3.5 - 5.1 mmol/L    CHLORIDE 107 96 - 111 mmol/L    CO2 TOTAL 20 (L) 23 - 31 mmol/L    ANION GAP 12 4 - 13 mmol/L    BUN 41 (H) 8 - 25 mg/dL    CREATININE 1.68 (H) 0.60 - 1.05 mg/dL    BUN/CREA RATIO 24 (H) 6 - 22    ESTIMATED GFR 30 (L) >=60 mL/min/BSA    ALBUMIN 3.3 (L) 3.4 - 4.8 g/dL     CALCIUM 9.3 8.8 - 10.2 mg/dL    GLUCOSE 159 (H) 65 - 125 mg/dL    ALKALINE PHOSPHATASE 114 55 - 145 U/L    ALT (SGPT) 14 8 - 22 U/L    AST (SGOT)  22 8 - 45 U/L    BILIRUBIN TOTAL 0.4 0.3 - 1.3 mg/dL    PROTEIN TOTAL 6.7 6.0 - 8.0 g/dL   CBC/DIFF    Narrative    The following orders were created for panel order CBC/DIFF.  Procedure                               Abnormality         Status                     ---------                               -----------         ------                     CBC WITH FT:[id number]                Abnormal            Final result                 Please view results for these tests on the individual orders.   CBC WITH DIFF   Result Value Ref Range    WBC 11.3 (H) 3.7 - 11.0 x10^3/uL    RBC 4.20 3.85 - 5.22 x10^6/uL    HGB 13.0 11.5 -  16.0 g/dL    HCT 40.2 34.8 - 46.0 %    MCV 95.7 78.0 - 100.0 fL    MCH 31.0 26.0 - 32.0 pg    MCHC 32.3 31.0 - 35.5 g/dL    RDW-CV 15.6 (H) 11.5 - 15.5 %    PLATELETS 319 150 - 400 x10^3/uL    MPV 10.3 8.7 - 12.5 fL    NEUTROPHIL % 68 %    LYMPHOCYTE  % 20 %    MONOCYTE % 9 %    EOSINOPHIL % 1 %    BASOPHIL % 0 %    NEUTROPHIL # 7.67 1.50 - 7.70 x10^3/uL    LYMPHOCYTE # 2.28 1.00 - 4.80 x10^3/uL    MONOCYTE # 0.97 0.20 - 1.10 x10^3/uL    EOSINOPHIL # 0.12 <=0.50 x10^3/uL    BASOPHIL # <0.10 <=0.20 x10^3/uL    IMMATURE GRANULOCYTE % 2 (H) 0 - 1 %    IMMATURE GRANULOCYTE # 0.20 (H) <0.10 x10^3/uL   URINALYSIS, MICROSCOPIC   Result Value Ref Range    RBCS 3-5 (A) None, 0-2 /hpf    WBCS 6-10 (A) None, Occasional, 0-2, 3-5, Rare /hpf    BACTERIA 3+ (A) Trace, None /hpf    SQUAMOUS EPITHELIAL CELLS 0-2 None, Occasional, 0-2, 3-5, 11-15, Rare /lpf            Imaging:    Results for orders placed or performed during the hospital encounter of 09/25/21 (from the past 24 hour(s))   XR AP MOBILE CHEST     Status: None    Narrative    Natalyn Knik-Fairview  Female, 87 years old.    XR AP MOBILE CHEST performed on 09/25/2021 4:51 PM.    REASON FOR EXAM:  Preop evaluation    TECHNIQUE: 1 views/1 images submitted for interpretation.    COMPARISON:  Chest radiograph performed March 01, 2021.    FINDINGS:  The heart is normal in size. The lungs are hyperinflated with a nonspecific prominent interstitial pattern likely due to underlying emphysema, unchanged. No focal consolidation, pleural effusion, or pneumothorax is present. Anchor sutures are present within both humeral heads. There is high riding of the humeral heads compatible with chronic rotator cuff insufficiency. 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      Impression    Chronic changes without evidence for an acute cardiopulmonary abnormality.          Radiologist location ID: [id number]     XR LUMBAR SPINE SERIES     Status: None    Narrative    Nealie RUTH Mount  Female, 87 years old.    XR LUMBAR SPINE SERIES performed on 09/25/2021 4:51 PM.    REASON FOR EXAM:  Fall, back pain    TECHNIQUE: 5 views/5 images submitted for interpretation.    COMPARISON:  Lumbar spine radiographs performed September 21, 2015.    FINDINGS:   Dextroscoliotic curvature of the lumbar spine is noted with the apex at L2, slightly worsened from 2017. There is diffusely diminished bone mineral density. The vertebral body heights appear unchanged. There are moderate multilevel degenerative changes of the lumbar spine. Atherosclerotic calcifications are noted along the abdominal aorta.      Impression    No acute fractures are visible, although, the diminished bone marrow density decreases the sensitivity for detection of nondisplaced fractures. If there is clinical concern for a radiographically occult lumbar fracture, MRI is recommended if clinically feasible.  Radiologist location ID: [id number]     XR KNEE RIGHT 2 VIEW     Status: None    Narrative    Jenelle RUTH Wernert  Female, 87 years old.    XR KNEE RIGHT 2 VIEWS performed on 09/25/2021 4:52 PM.    REASON FOR EXAM:  pain fall    TECHNIQUE: 2 views/2 images submitted for interpretation.    COMPARISON:  Right femur radiographs performed June 30, 2020.    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 An old, healed distal right femur fracture is noted. No new fractures are visible.      Impression    Old, healed distal right femur fracture without evidence for a new acute bony abnormality in the right knee.          Radiologist location ID: [id number]     XR HIP RIGHT W PELVIS 2-3 VIEWS     Status: None    Narrative    Celita RUTH Bonaparte  Female, 87 years old.    XR HIP RIGHT W PELVIS 2-3 VIEWS performed on 09/25/2021 4:52 PM.    REASON FOR EXAM:  Fall, pain, tenderness    TECHNIQUE: 4 views/4 images submitted for interpretation.    COMPARISON:  Right femur radiographs performed 2021.    FINDINGS:  Intramedullary nail and spiral blade fixation hardware is noted within the proximal right femur. There are postoperative changes from left hip arthroplasty. Plate-screw fixation hardware along  the mid to distal right femur is noted. There appears to be a nondisplaced fracture through the superior pubic ramus on the right. There is suggestion of subtle cortical irregularity and acute angulation at the right femoral head-neck junction raising suspicion for a nondisplaced fracture.      Impression    1. Apparent cortical irregularity and angulated margins at the right femoral head-neck junction, suspicious for a nondisplaced subcapital fracture. Further characterization with CT may be helpful.    2. Nondisplaced right superior pubic ramus fracture.        Radiologist location ID: [id number]     CT PELVIS WO IV CONTRAST     Status: None    Narrative    Exa RUTH Kitchings  Female, 87 years old.    CT PELVIS WO IV CONTRAST performed on 09/25/2021 6:28 PM.    REASON FOR EXAM:  ?  Subcapital hip fracture on plain film, right hip pain    RADIATION DOSE: 260.90 mGycm    TECHNIQUE: CT the abdomen and pelvis performed May 07, 2020.    COMPARISON: CT of the abdomen and pelvis performed June 06, 2020.    FINDINGS:  There are postoperative changes from total left hip arthroplasty as well as spiral blade and intramedullary nail fixation of the proximal right femur. Corresponding to the appearance on the earlier radiographs, there is a nondisplaced fracture through the subcapital aspect of the right femoral neck with cortical disruption and irregularity most conspicuous along the medial margin of the femoral head-neck junction as seen on series 6 image 32. Nondisplaced fractures can also be seen through the right inferior and superior pubic rami. No diastases of the sacroiliac joints or symphysis pubis is noted. No definite additional fractures are detected in the pelvis. Moderate degenerative changes are present in the right hip with joint space loss and osteophyte formation as well as subchondral cystic changes.    Soft tissue swelling  and likely subcutaneous hematoma can be seen overlying the right hip, series 4  image 48 with a suspected subcutaneous hematoma measuring up to 6.5 cm.    Multiple diverticula are noted within the included portions of the sigmoid and descending colon.      Impression    1. Non-displaced subcapital fracture of the right femoral neck with overlying subcutaneous soft tissue hematoma measuring up to 6.5 cm.    2. Non-displaced right inferior and superior pubic rami fractures.        Radiologist location ID: [id number]           Microbiology:  No results found for any visits on 09/25/21 (from the past 96 hour(s)).        Assessment/ Plan:     Active Hospital Problems    Diagnosis   . Primary Problem: Hip fracture (CMS HCC)   . Pelvic fracture (CMS HCC)   . Debility        Patient is hemodynamically stable.  Pain is managed.  She does have some mild nausea but abdomen is benign.  Will continue DVT prophylaxis plan repeat labs in the morning.  Dr. Owens Shark is been consulted and will consult physical therapy.  She would like to go home when I will ask Case Management to assess her home needs.    Disposition Planning:   Pending Orthopedic, Physical therapy and Case Management evaluations.    Rex Kras, DO    This note was partially generated using MModal Fluency Direct system, and there may be some incorrect words, spellings, and punctuation that were not noted in checking the note before saving

## 2021-09-26 NOTE — Nurses Notes (Signed)
Urine obtained for urinalysis and sent to lab.

## 2021-09-26 NOTE — Consults (Signed)
Theresa Greene        Date of Service:  09/26/2021  Theresa Greene [id number] y.o. female  Date of Admission:  09/25/2021  Date of Birth:  26-Jul-1935    Consulting Provider and Reason: Dr Graylon Good and Dr Jonita Albee, Rt pubic rami and fem neck fxs    Chief complaint: Rt hip pain    HPI: 87 yo fem who presented to ER yesterday after a fall onto rt hip.  Found to have Rt sided sup and inf pubic rami fxs and also signs nondisplaced subcapital fem neck fx.  Pt already with fixation in place from past treatment IT hip fx (short TFN) along with long lateral femoral plate for treatment periprosthetic femur fracture around TFN and also TKA distally.  Appears to have been treated by Dr Roslynn Amble in 2021 and says she underwent ORIF rt femur in Oct 2022 in [place].  Pt was unable to bear weight and was admitted.  Injuries appear amenable to nonoperative treatment.  Lives with her husband who has cancer.  Says her daughter from [facility] who is a nurse is coming to live with them.  Had [facility] home health after last surgery.      Past Medical History:    Past Medical History:   Diagnosis Date   . Arthritis    . Asthma    . Back problem    . CAD S/P percutaneous coronary angioplasty 2010    Pt reports 5 years ago   . Charcot's joint of foot, left 08/06/2014   . Colon polyp    . CVA (cerebrovascular accident) (CMS [place]) 1995    no residual deficits   . Deaf    . Diverticulitis of colon    . Gastroesophageal reflux disease 08/06/2014   . H/O hearing loss    . History of cardiac pacemaker 08/06/2014   . HTN (hypertension)    . Hyperlipidemia 12/25/2019   . Muscle weakness    . Neuropathy (CMS HCC)    . Pacemaker    . Paroxysmal atrial fibrillation (CMS HCC) 08/06/2014   . PUD (peptic ulcer disease) 08/06/2014    C/b GIB [place] 2015   . Rheumatoid arthritis (CMS [place]) 08/06/2014   . Stage 3 chronic kidney disease (CMS [place]) 12/25/2019    Last Assessment & Plan:  Renal function stable, at baseline Stable electrolytes; restart  sodium bicarb 650 mg BID  Continue renal protective ARB Discussed CKD risk factor modifications, including tight BP/glucose control, low protein diet, and avoidance of NSAIDs   . Stented coronary artery    . Vitamin D deficiency 12/25/2019   . Wears glasses        Past Surgical History:  Past Surgical History:   Procedure Laterality Date   . ANKLE SURGERY Left     repair with metal   . CATARACT EXTRACTION      unsure which eye but thinks she only had one eye done   . CORONARY ARTERY ANGIOPLASTY  2011   . ESOPHAGOGASTRODUODENOSCOPY     . HIP SURGERY Right     repair with metal   . HX BACK SURGERY      lower back x 3 no metal   . HX COLONOSCOPY     . HX HIP REPLACEMENT Left    . HX NISSEN FUNDOPLICATION  [id number]    then three others to repair problems   . [place]    with common duxt exploration   .  HX PACEMAKER INSERTION  2019   . HX SHOULDER SURGERY Bilateral     scopes   . KNEE ARTHROPLASTY Bilateral    . 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 PANCREATIC PSEUDOCYST DRAINAGE  1971   . TOTAL ABDOMINAL HYSTERECTOMY         Family History:  Family Medical History:     Problem Relation (Age of Onset)    Ovarian Cancer Mother    Pancreatic Cancer Father          Social History:   reports that she has never smoked. She has never used smokeless tobacco. She reports that she does not drink alcohol and does not use drugs.    Allergies:  Allergies   Allergen Reactions   . Iodinated Contrast Media Anaphylaxis   . Etodolac NO Steroids unless approved by Attending Physician   . Iv Contrast      makese her serverly sick   . Symbicort [Budesonide-Formoterol]    . Codeine  Other Adverse Reaction (Add comment) and Nausea/ Vomiting     Stomach pain  Stomach pain         Home Meds:  Medications Prior to Admission     Prescriptions    amiodarone (PACERONE) 200 mg Oral Tablet    Take 0.5 Tablets (100 mg total) by mouth Once a day for 90 days    amLODIPine (NORVASC) 5  mg Oral Tablet    Take 1 Tablet (5 mg total) by mouth Once a day    atorvastatin (LIPITOR) 20 mg Oral Tablet    Take 1 Tablet (20 mg total) by mouth Once a day for 90 days    clopidogreL (PLAVIX) 75 mg Oral Tablet    Take 1 Tablet (75 mg total) by mouth Once a day for 90 days    CREON 36,000-114,000- 180,000 unit Oral Capsule, Delayed Release(E.C.)    1 Capsule Three times daily with meals    Dexlansoprazole (DEXILANT) 60 mg Oral Cap, Delayed Rel., Multiphasic    Take 1 Capsule (60 mg total) by mouth Once a day    dicyclomine HCl (BENTYL ORAL)    Take 60 mg by mouth Once a day    ergocalciferol, vitamin D2, (DRISDOL) 1,250 mcg (50,000 unit) Oral Capsule    Take 1 Capsule (50,000 Units total) by mouth Every 7 days    ezetimibe (ZETIA) 10 mg Oral Tablet    Take 1 Tablet (10 mg total) by mouth Once a day for 90 days    gabapentin (NEURONTIN) 100 mg Oral Capsule    Take 1 Capsule (100 mg total) by mouth Twice daily    gemfibroziL (LOPID) 600 mg Oral Tablet    Take 1 Tablet (600 mg total) by mouth Twice a day before meals for 90 days    losartan (COZAAR) 50 mg Oral Tablet    Take 0.5 Tablets (25 mg total) by mouth Twice daily for 90 days    metoprolol succinate (TOPROL-XL) 100 mg Oral Tablet Sustained Release 24 hr    Take 1 Tablet (100 mg total) by mouth Once a day    metoprolol tartrate (LOPRESSOR) 25 mg Oral Tablet    Take 1 Tablet (25 mg total) by mouth Once a day    sodium bicarbonate 650 mg Oral Tablet    Take 1 Tablet (650 mg total) by mouth Twice daily  Inpatient Meds:  acetaminophen (TYLENOL) tablet, 650 mg, Oral, Q6H PRN  amiodarone (CORDARONE) tablet, 100 mg, Oral, Daily  amLODIPine (NORVASC) tablet, 5 mg, Oral, Daily  atorvastatin (LIPITOR) tablet, 20 mg, Oral, Daily  clopidogrel (PLAVIX) 75 mg tablet, 75 mg, Oral, Daily  docusate sodium (COLACE) capsule, 100 mg, Oral, 2x/day  enoxaparin PF (LOVENOX) 30 mg/0.3 mL SubQ injection, 30 mg, Subcutaneous, Daily  ergocalciferol-vitamin D2 (DRISDOL) capsule,  50,000 Units, Oral, Q7 Days  ezetimibe (ZETIA) tablet, 10 mg, Oral, Daily  gabapentin (NEURONTIN) capsule, 100 mg, Oral, 2x/day  HYDROcodone-acetaminophen (NORCO) 5-325 mg per tablet, 1 Tablet, Oral, Q4H PRN  HYDROmorphone (DILAUDID) 0.5 mg/0.5 mL injection, 0.5 mg, Intravenous, Q3H PRN  losartan (COZAAR) tablet, 50 mg, Oral, Daily  metoprolol succinate (TOPROL-XL) 24 hr extended release tablet, 100 mg, Oral, Daily  NS flush syringe, 10 mL, Intracatheter, Q8HRS  NS flush syringe, 10 mL, Intracatheter, Q1H PRN  ondansetron (ZOFRAN) 2 mg/mL injection, 4 mg, Intravenous, Q4H PRN  pancreatic enzyme replacement (CREON) 36,000 units lipase per cap, 1 Capsule, Oral, 3x/day-Meals  pantoprazole (PROTONIX) delayed release tablet, 40 mg, Oral, Daily  sodium bicarbonate tablet, 650 mg, Oral, 2x/day        ROS:   Constitutional:  No fevers or recent illness, no fatigue, no lightheadedness or dizziness  Respiratory:  No difficulty breathing, no wheezing, no cough  Cardiovascular:  No chest pain, no palpitations, no shortness of breath with exertion  Gastrointestinal:  No abdominal pain, no blood in stool, no constipation, no nausea, no vomiting, no diarrhea  Genitourinary:  No difficulty or pain with urination, no blood in urine  Psych: No depression, no anxiety  Neuro: No weakness.  Numbness bottom both feet chronically after prior surgery.  Musculoskeletal:  Rt hip pain    Exam:  Vitals:    09/26/21 0000 09/26/21 0247 09/26/21 0400 09/26/21 0800   BP: (!) 179/82  (!) 157/65 113/70   Pulse: 70  70 70   Resp: 18 18 18 18    Temp: 36.9 C (98.4 F)  36.6 C (97.9 F) 36.9 C (98.4 F)   SpO2: 93%  98% 98%   Weight:       Height:       BMI:                Physical Exam:    General:  No acute distress.  Alert.  Oriented.  Skin:  Warm, pink, no lesions, no edema.  Head:  Atraumatic, normocephalic.  Eyes:  Pupils equal.  Ocular motor intact.  No scleral icterus or conjunctiva.  Some ecchymosis around both.  ENT:  No tracheal deviation.   Oral mucosa moist/pink.  Normal nose/ears.  Chest:  Symmetrical excursion.  No dyspnea.  No audible wheezes.  Cardiovascular:  Regular palpable distal pulses.  Brisk papillary refill.  Abdomen:  Soft. Non-distended. Non tender.  Neuro:  UE and LE motor grossly intact.  No tremor.  Decreased sensation plantar feet bilat    Musculoskeletal: mild tender lateral rt hip, min tender with passive hip motion    Radiology Review:     Imaging Studies:    CT PELVIS WO IV CONTRAST   Final Result      1. Non-displaced subcapital fracture of the right femoral neck with overlying subcutaneous soft tissue hematoma measuring up to 6.5 cm.      2. Non-displaced right inferior and superior pubic rami fractures.            Radiologist location ID: [id number]  XR HIP RIGHT W PELVIS 2-3 VIEWS   Final Result      1. Apparent cortical irregularity and angulated margins at the right femoral head-neck junction, suspicious for a nondisplaced subcapital fracture. Further characterization with CT may be helpful.      2. Nondisplaced right superior pubic ramus fracture.            Radiologist location ID: [id number]         XR KNEE RIGHT 2 VIEW   Final Result      Old, healed distal right femur fracture without evidence for a new acute bony abnormality in the right knee.               Radiologist location ID: [id number]         XR LUMBAR SPINE SERIES   Final Result      No acute fractures are visible, although, the diminished bone marrow density decreases the sensitivity for detection of nondisplaced fractures. If there is clinical concern for a radiographically occult lumbar fracture, MRI is recommended if clinically feasible.               Radiologist location ID: [id number]         XR AP MOBILE CHEST   Final Result      Chronic changes without evidence for an acute cardiopulmonary abnormality.               Radiologist location ID: [id number]             Labs:     Results for orders placed or performed during the hospital encounter of  09/25/21 (from the past 24 hour(s))   URINALYSIS WITH REFLEX MICROSCOPIC AND CULTURE IF POSITIVE    Specimen: Urine, Site not specified    Narrative    The following orders were created for panel order URINALYSIS WITH REFLEX MICROSCOPIC AND CULTURE IF POSITIVE.  Procedure                               Abnormality         Status                     ---------                               -----------         ------                     URINALYSIS, MACRO/MICRO[[id number]]      Abnormal            Final result               URINALYSIS, MICROSCOPIC[[id number]]      Abnormal            Final result                 Please view results for these tests on the individual orders.   URINALYSIS, MACRO/MICRO   Result Value Ref Range    SPECIFIC GRAVITY 1.025 1.005 - 1.030    GLUCOSE Negative Negative mg/dL    PROTEIN 30 (A) Negative mg/dL    BILIRUBIN Negative Negative mg/dL    UROBILINOGEN 0.2 0.2 - 1.0 mg/dL    PH 5.5 4.6 - 8.0    BLOOD Trace (A) Negative mg/dL  KETONES Negative Negative mg/dL    NITRITE Negative Negative    LEUKOCYTES Small (A) Negative WBCs/uL    APPEARANCE Cloudy (A) Clear    COLOR Yellow Yellow   COMPREHENSIVE METABOLIC PANEL, NON-FASTING   Result Value Ref Range    SODIUM 139 136 - 145 mmol/L    POTASSIUM 4.2 3.5 - 5.1 mmol/L    CHLORIDE 107 96 - 111 mmol/L    CO2 TOTAL 20 (L) 23 - 31 mmol/L    ANION GAP 12 4 - 13 mmol/L    BUN 41 (H) 8 - 25 mg/dL    CREATININE 1.68 (H) 0.60 - 1.05 mg/dL    BUN/CREA RATIO 24 (H) 6 - 22    ESTIMATED GFR 30 (L) >=60 mL/min/BSA    ALBUMIN 3.3 (L) 3.4 - 4.8 g/dL     CALCIUM 9.3 8.8 - 10.2 mg/dL    GLUCOSE 159 (H) 65 - 125 mg/dL    ALKALINE PHOSPHATASE 114 55 - 145 U/L    ALT (SGPT) 14 8 - 22 U/L    AST (SGOT)  22 8 - 45 U/L    BILIRUBIN TOTAL 0.4 0.3 - 1.3 mg/dL    PROTEIN TOTAL 6.7 6.0 - 8.0 g/dL   CBC/DIFF    Narrative    The following orders were created for panel order CBC/DIFF.  Procedure                               Abnormality         Status                      ---------                               -----------         ------                     CBC WITH FT:[id number]                Abnormal            Final result                 Please view results for these tests on the individual orders.   CBC WITH DIFF   Result Value Ref Range    WBC 11.3 (H) 3.7 - 11.0 x10^3/uL    RBC 4.20 3.85 - 5.22 x10^6/uL    HGB 13.0 11.5 - 16.0 g/dL    HCT 40.2 34.8 - 46.0 %    MCV 95.7 78.0 - 100.0 fL    MCH 31.0 26.0 - 32.0 pg    MCHC 32.3 31.0 - 35.5 g/dL    RDW-CV 15.6 (H) 11.5 - 15.5 %    PLATELETS 319 150 - 400 x10^3/uL    MPV 10.3 8.7 - 12.5 fL    NEUTROPHIL % 68 %    LYMPHOCYTE % 20 %    MONOCYTE % 9 %    EOSINOPHIL % 1 %    BASOPHIL % 0 %    NEUTROPHIL # 7.67 1.50 - 7.70 x10^3/uL    LYMPHOCYTE # 2.28 1.00 - 4.80 x10^3/uL    MONOCYTE # 0.97 0.20 - 1.10 x10^3/uL    EOSINOPHIL # 0.12 <=0.50 x10^3/uL    BASOPHIL # <0.10 <=0.20 x10^3/uL    IMMATURE  GRANULOCYTE % 2 (H) 0 - 1 %    IMMATURE GRANULOCYTE # 0.20 (H) <0.10 x10^3/uL   URINALYSIS, MICROSCOPIC   Result Value Ref Range    RBCS 3-5 (A) None, 0-2 /hpf    WBCS 6-10 (A) None, Occasional, 0-2, 3-5, Rare /hpf    BACTERIA 3+ (A) Trace, None /hpf    SQUAMOUS EPITHELIAL CELLS 0-2 None, Occasional, 0-2, 3-5, 11-15, Rare /lpf        DNR Status:  Prior    Assessment/Plan:   Active Hospital Problems    Diagnosis   . Primary Problem: Hip fracture (CMS HCC)   . Pelvic fracture (CMS HCC)   . Debility       Surgical Plan/Recommendation: Non op treatment rt fem neck and pubic rami fxs recommended, may WBAT RLE with therapy, PT ordered  Medical Considerations: multiple, per others  DVT/PE Prophylaxis: per others, on Lovenox and plavix  Disposition Planning: per others, can see back in Ortho clinic in 4 wks for eval healing    Gerrie Nordmann, MD    This note was partially generated using MModal Fluency Direct system, and there may be some incorrect words, spellings, and punctuation that were not noted in checking the note before saving

## 2021-09-26 NOTE — Care Plan (Signed)
[place]  Physical Therapy Initial Evaluation    Patient Name: Theresa Greene  Date of Birth: 01/16/1935  Height: Height: 167.6 cm (5\' 6" )  Weight: Weight: 49.1 kg (108 lb 3.2 oz)  Room/Bed: 701/B  Payor: MEDICARE / Plan: MEDICARE PART A AND B / Product Type: Medicare /     Assessment:      (P) 87 y.o. female s/p fall with subsequent R superior and inferior pubic rami fractures and nondisplaced subcapital femoral neck fracture previously fixated. Non operative treatment recommended with orders for WBAT on RLE. 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 Pt demonstrates increased pain in RLE and decreased strength limiting her safety and independence with bed mobility, transfers and ambulation. Pt required MAX A and increased time with use of hospital bed functions and rail to complete bed mobility, MOD A to complete transfers with cues for set up and sequencing and MOD A to ambulate 2 ft with step by step cues for sequencing with limited weightbearing tolerance on RLE. O2 sats 94% at rest on room air and 89% with activity with recovery to 91% in < 1 minute at rest.  Skilled PT services warranted to improve pt's RLE strength, to improve her independence with bed mobility and transfers, to improve her gait mechanics and independence with need to progress to ambulating  household distances and to progress pt to negotiating stairs in order to access her home at discharge. Pt lives with her husband in a one story home with 2 STE with one rail. Pt was independent with use of rollator prior to fall. Pt would benefit from inpatient rehabilitation stay prior to discharge home with home health. No DME needs currently noted with pt's husband reporting pt has FWW at home.    Discharge Needs:    Equipment Recommendation: (P) TBD      The patient presents  with mobility limitations due to impaired balance, impaired strength, weight bearing restrictions, and impaired functional activity tolerance that significantly impair/prevent patient's ability to participate in mobility-related activities of daily living (MRADLs) including  ambulation and transfers in order to safely complete, toileting, bathing, food preparation, laundering/household tasks, safely entering/exiting the home, in reasonable time. This functional mobility deficit can be sufficiently resolved with the use of a (P) TBD  in order to decrease the risk of falls, morbidity, and mortality in performance of these MRADLs.  Patient is able to safely use this assistive device.    Discharge Disposition: (P) inpatient rehabilitation facility prior to discharge home with home health     JUSTIFICATION OF DISCHARGE RECOMMENDATION   Based on current diagnosis, functional performance prior to admission, and current functional performance, this patient requires continued PT services in (P) inpatient rehabilitation facility in order to achieve significant functional improvements in these deficit areas: (P) aerobic capacity/endurance, gait, locomotion, and balance, joint integrity and mobility, muscle performance, ventilation and respiration/gas exchange.    Plan:   Current Intervention: (P) balance training, bed mobility training, gait training, home exercise program, patient/family education, stair training, strengthening, transfer training  To provide physical therapy services (P) minimum of 5x/week  for duration of (P) until discharge.    The risks/benefits of therapy have been discussed with the patient/caregiver and he/she is in agreement with the established plan of care.       Subjective & Objective  09/26/21 1103   Therapist Pager   PT Assigned/ Pager # Jhonnie Aliano Donna Christen DPT [phone]   Rehab Session   Document Type evaluation   Total PT Minutes: 30   Patient Effort good   Symptoms Noted During/After Treatment  increased pain;nausea   Symptoms Noted Comment Pt reported increased pain in RLE from 5/10 to 9/10 with activity. Pt also reported nausea following gait assessment. BP 155/67 O2 sats 89% with recovery to 91% following gait assessment.   General Information   Patient Profile Reviewed yes   Onset of Illness/Injury or Date of Surgery 09/25/21   Referring Physician Dr. Owens Shark   Pertinent History of Current Functional Problem 87 y.o. female s/p fall with subsequent R superior and inferior pubic rami fractures and nondisplaced subcapital femoral neck fracture previously fixated. Non operative treatment recommended with orders for WBAT on RLE. 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   Medical Lines PIV Line   Respiratory Status nasal cannula   Existing Precautions/Restrictions fall precautions;oxygen therapy device and L/min   Weight-bearing Status   Extremity Weight-bearing Status right lower extremity   Right Lower Extremity weight-bearing as tolerated (WBAT)   Mutuality/Individual Preferences   Anxieties, Fears or Concerns None stated   Individualized Care Needs Nursing and physical therapy   Patient-Specific Goals (Include Timeframe) Home with husband once appropriate. Agreeable to inpatient rehabilitation prior to discharge home based upon performance during evaluation.   Plan of Care Reviewed With patient;spouse   Living Environment   Lives With spouse   Living Arrangements house   Home Accessibility stairs to enter home   Financial Concerns none   Transportation Available family or friend will provide   Living Environment Comment Pt lives with husband in one story home with 2 STE with rail. Pt has walk in shower with seat. Pt's husband has cancer per chart.   Functional Level Prior   Ambulation 1 - assistive equipment   Transferring 1 - assistive equipment   Toileting 1 - assistive  equipment   Bathing 1 - assistive equipment   Dressing 0 - independent   Eating 0 - independent   Communication 0 - understands/communicates without difficulty   Swallowing 0-->swallows foods/liquids without difficulty   Prior Functional Level Comment Pt was independent with ambulation utilizing a rollator prior to admission.   Pre Treatment Status   Pre Treatment Patient Status Patient supine in bed;Telephone within reach;Call light within reach;Nurse approved session  (Alarm engaged)   Support Present Pre Treatment  Family present   Communication Pre Treatment  Nurse   Communication Pre Treatment Comment RN approved session   Cognitive Assessment/Interventions   Behavior/Mood Observations behavior appropriate to situation, WNL/WFL   Orientation Status oriented x 3   Attention WNL/WFL   Follows Commands follows two step commands   Vital Signs   Post Treatment BP 155/67   Pre SpO2 (%) 94  (Pt on O2 via n.c. at beginning of session with O2 removed for trials with sats 94% at rest and 89% with activity . Sats recovered to 91% at rest in < 1 minute with pt returned to O2 via n.c at end of session.)   O2 Delivery Pre Treatment room air   Post SpO2 (%) 93   O2 Delivery Post Treatment supplemental O2   Pain Assessment   Pain Intervention  PRN Medication;Repositioned   Pretreatment Pain Rating  5/10   Posttreatment Pain Rating 9/10   Pain Location - Side Right   Pain Location - Orientation medial   Pain Location   (thigh)   Pre/Posttreatment Pain Comment Pt reported pain medication administered prior to session with pt requesting to know when mediation was due again at end of session. Spoke with RN who report  pain pill can be administered.   RUE Assessment   RUE Assessment WFL for stated baseline   LUE Assessment   LUE Assessment WFL for stated baseline   RLE Assessment   RLE Assessment   (2/5 wit pt limited by pain)   LLE Assessment   LLE Assessment   (4/5)   Bed Mobility Assessment/Treatment   Bed Mobility, Assistive  Device Head of Bed Elevated;bed rails   Supine-Sit Independence verbal cues required;maximum assist (25% patient effort)   Sit to Supine, Independence verbal cues required;maximum assist (25% patient effort)   Safety Issues decreased use of arms for pushing/pulling;decreased use of legs for bridging/pushing   Impairments pain;strength decreased   Transfer Assessment/Treatment   Sit-Stand Independence moderate assist (50% patient effort);minimum assist (75% patient effort);verbal cues required   Stand-Sit Independence moderate assist (50% patient effort);minimum assist (75% patient effort);verbal cues required   Sit-Stand-Sit, Assist Device walker, front wheeled   Transfer Safety Issues balance decreased during turns;sequencing ability decreased;step length decreased;weight-shifting ability decreased   Transfer Impairments balance impaired;pain;strength decreased   Gait Assessment/Treatment   Total Distance Ambulated 2   Independence  moderate assist (50% patient effort);verbal cues required   Assistive Device  walker, front wheeled   Deviations  cadence decreased;double stance time increased;step length decreased;toe-to-floor clearance decreased;weight-shifting ability decreased   Safety Issues  sequencing ability decreased;step length decreased;weight-shifting ability decreased   Impairments  balance impaired;endurance;pain;strength decreased   Comment Pt requires step by step cues to sequence step, weight shift and walker with up to MOD A to assist with acceptance of weight through upper extremities on FWW and take step with LLE.   Balance Skill Training   Sitting Balance: Static good balance   Sitting, Dynamic (Balance) fair + balance   Sit-to-Stand Balance poor + balance   Standing Balance: Static poor + balance   Standing Balance: Dynamic unable to balance   Systems Impairment Contributing to Balance Disturbance somatosensory;musculoskeletal   Identified Impairments Contributing to Balance Disturbance  pain;impaired sensory feedback;decreased strength   Post Treatment Status   Post Treatment Patient Status Patient supine in bed;Call light within reach  (Alarm engaged)   Support Present Post Treatment  Family present   Communication Post Treatment Nurse;Care Management   Communication Post Treatment Comment Notified RN of pt's c/o pain and request for pain medication. Discussed recommendation for inpatient rehabilitation with case manager.   Plan of Care Review   Plan Of Care Reviewed With patient;spouse   Physical Therapy Clinical Impression   Assessment  87 y.o. female s/p fall with subsequent R superior and inferior pubic rami fractures and nondisplaced subcapital femoral neck fracture previously fixated. Non operative treatment recommended with orders for WBAT on RLE. 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 Pt demonstrates increased pain in RLE and decreased strength limiting her safety and independence with bed mobility, transfers and ambulation. Pt required MAX A and increased time with use of hospital bed functions and rail to complete bed mobility, MOD A to complete transfers with  cues for set up and sequencing and MOD A to ambulate 2 ft with step by step cues for sequencing with limited weightbearing tolerance on RLE. O2 sats 94% at rest on room air and 89% with activity with recovery to 91% in < 1 minute at rest.  Skilled PT services warranted to improve pt's RLE strength, to improve her independence with bed mobility and transfers, to improve her gait mechanics and independence with need to progress to ambulating  household distances and to progress pt to negotiating stairs in order to access her home at discharge. Pt lives with her husband in a one story home with 2 STE with one rail. Pt was independent with use of rollator prior to fall. Pt would benefit from inpatient  rehabilitation stay prior to discharge home with home health. No DME needs currently noted with pt's husband reporting pt has FWW at home.   Patient/Family Goals Statement Pt and her husband agreeable to inpatient rehabilitation prior to discharge home with home health   Criteria for Skilled Therapeutic yes   Pathology/Pathophysiology Noted musculoskeletal;pulmonary   Impairments Found (describe specific impairments) aerobic capacity/endurance;gait, locomotion, and balance;joint integrity and mobility;muscle performance;ventilation and respiration/gas exchange   Functional Limitations in Following  self-care;home management;community/leisure   Rehab Potential good   Therapy Frequency minimum of 5x/week   Predicted Duration of Therapy Intervention (days/wks) until discharge   Anticipated Equipment Needs at Discharge (PT) TBD   Anticipated Discharge Disposition inpatient rehabilitation facility   Evaluation Complexity Justification   Patient History: Co-morbidity/factors that impact Plan of Care 3 or more that impact Plan of Care   Examination Components 4 or more Exam elements addressed   Presentation Evolving: Symptoms, complaints, characteristics of condition changing &/or cognitive deficits present   Clinical Decision Making Moderate complexity   Evaluation Complexity Moderate complexity   Care Plan Goals   PT Rehab Goals Bed Mobility Goal;Gait Training Goal;Stairs Training Goal;Strength Goal;Transfer Training Goal   Bed Mobility Goal   Bed Mobility Goal, Date Established 09/26/21   Bed Mobility Goal, Time to Achieve by discharge   Bed Mobility Goal, Activity Type supine to sit/sit to supine;roll left/roll right   Bed Mobility Goal, Independence Level modified independence   Bed Mobility Goal, Assistive Device least restrictive assistive device   Gait Training  Goal, Distance to Achieve   Gait Training  Goal, Date Established 09/26/21   Gait Training  Goal, Time to Achieve by discharge   Gait Training  Goal,  Independence Level stand-by assistance   Gait Training  Goal, Assist Device walker, rolling   Gait Training  Goal, Distance to Achieve 100   Strength Goal   Strength Goal, Date Established 09/26/21   Strength Goal, Time to Achieve by discharge   Strength Goal, Measure to Achieve 3+/5   Strength Goal, Functional Goal Pt will increase her RLE strength to 3+/5 in order to improve her transfer independence and gait stability with FWW.   Stairs Training Goal   Stairs Training Goal, Date Established 09/26/21   Stairs Training Goal, Time to Achieve by discharge   Stairs Training Goal, Independence Level contact guard assist   Stairs Training Goal, Assist Device handrail, left;handrail, right  (One rail)   Stairs Training Goal, Number of Stairs to Achieve 2   Transfer Training Goal   Transfer Training Goal, Date Established 09/26/21   Transfer Training Goal, Time to Achieve by discharge   Transfer Training Goal, Activity Type bed-to-chair/chair-to-bed;sit-to-stand/stand-to-sit   Transfer Training Goal, Independence Level modified independence  Transfer Training Goal, Assist Device walker, rolling   Planned Therapy Interventions, PT Eval   Planned Therapy Interventions (PT) balance training;bed mobility training;gait training;home exercise program;patient/family education;stair training;strengthening;transfer training   [place] Relationship/Rapport care explained;questions answered;thoughts/feelings acknowledged   Supervisory visit completed with PTA. Reviewed POC including precautions, interventions, goals, and discharge plan. PTA verbalized understanding and is able to carry out POC.      Therapist:   [name], PT [phone]

## 2021-09-26 NOTE — Nurses Notes (Signed)
Nurse at bedside and states "Theresa Greene", patient awaken easy- states "Is it time to eat again?", Nurse states not till 5- Patient states she "took her pain shot earlier and I want to nap". No needs expressed- awaken to verbal stimuli- verified Chrystal RN

## 2021-09-27 LAB — BASIC METABOLIC PANEL
ANION GAP: 8 mmol/L (ref 4–13)
BUN/CREA RATIO: 30 — ABNORMAL HIGH (ref 6–22)
BUN: 51 mg/dL — ABNORMAL HIGH (ref 8–25)
CALCIUM: 8.7 mg/dL — ABNORMAL LOW (ref 8.8–10.2)
CHLORIDE: 104 mmol/L (ref 96–111)
CO2 TOTAL: 25 mmol/L (ref 23–31)
CREATININE: 1.68 mg/dL — ABNORMAL HIGH (ref 0.60–1.05)
ESTIMATED GFR: 30 mL/min/BSA — ABNORMAL LOW (ref >=60)
GLUCOSE: 98 mg/dL (ref 65–125)
POTASSIUM: 4.3 mmol/L (ref 3.5–5.1)
SODIUM: 137 mmol/L (ref 136–145)

## 2021-09-27 LAB — CBC
HCT: 30.6 % — ABNORMAL LOW (ref 34.8–46.0)
HGB: 10 g/dL — ABNORMAL LOW (ref 11.5–16.0)
MCH: 31.5 pg (ref 26.0–32.0)
MCHC: 32.7 g/dL (ref 31.0–35.5)
MCV: 96.5 fL (ref 78.0–100.0)
MPV: 10.3 fL (ref 8.7–12.5)
PLATELETS: 226 10*3/uL (ref 150–400)
RBC: 3.17 10*6/uL — ABNORMAL LOW (ref 3.85–5.22)
RDW-CV: 15.2 % (ref 11.5–15.5)
WBC: 12.6 10*3/uL — ABNORMAL HIGH (ref 3.7–11.0)

## 2021-09-27 NOTE — Care Plan (Signed)
[facility]  Care Plan Note    Situation: weakness, hip fracture    Intervention: therapy, pain control    Response: Plan of care is ongoing      Theresa Greene S [name], RN    Problem: Adult Inpatient Plan of Care  Goal: Plan of Care Review  Outcome: Ongoing (see interventions/notes)  Goal: Patient-Specific Goal (Individualized)  Outcome: Ongoing (see interventions/notes)  Goal: Absence of Hospital-Acquired Illness or Injury  Outcome: Ongoing (see interventions/notes)  Goal: Optimal Comfort and Wellbeing  Outcome: Ongoing (see interventions/notes)  Goal: Rounds/Family Conference  Outcome: Ongoing (see interventions/notes)     Problem: Health Knowledge, Opportunity to Enhance (Adult,Obstetrics,Pediatric)  Goal: Knowledgeable about Health Subject/Topic  Description: Patient will demonstrate the desired outcomes by discharge/transition of care.  Outcome: Ongoing (see interventions/notes)     Problem: Skin Injury Risk Increased  Goal: Skin Health and Integrity  Outcome: Ongoing (see interventions/notes)     Problem: Pain Acute  Goal: Optimal Pain Control and Function  Outcome: Ongoing (see interventions/notes)     Problem: Fall Injury Risk  Goal: Absence of Fall and Fall-Related Injury  Outcome: Ongoing (see interventions/notes)     Problem: Hip Fracture Medical Management  Goal: Optimal Coping with Change in Health Status  Outcome: Ongoing (see interventions/notes)  Goal: Absence of Bleeding  Outcome: Ongoing (see interventions/notes)  Goal: Effective Bowel Elimination  Outcome: Ongoing (see interventions/notes)  Goal: Baseline Cognitive Function Maintained  Outcome: Ongoing (see interventions/notes)  Goal: Absence of Embolism  Outcome: Ongoing (see interventions/notes)  Goal: Fracture Stability  Outcome: Ongoing (see interventions/notes)  Goal: Optimal Functional Performance  Outcome: Ongoing (see interventions/notes)  Goal: Optimal Pain Control and Function  Outcome: Ongoing (see interventions/notes)  Goal:  Effective Urinary Elimination  Outcome: Ongoing (see interventions/notes)

## 2021-09-27 NOTE — Nurses Notes (Signed)
Patient family member brought in home Dexlansoprazole. Faxed home medication form to pharmacy and locked medication in patient lock box.

## 2021-09-27 NOTE — Care Plan (Signed)
[place]  Physical Therapy Progress Note    Patient Name: Theresa Greene  Date of Birth: 21-Apr-1935  Height:  167.6 cm (5' 6" )  Weight:  49.1 kg (108 lb 3.2 oz)    Room/Bed: 701/B  Payor: Payor: MEDICARE / Plan: MEDICARE PART A AND B / Product Type: Medicare /     Referring physician: No ref. provider found  Pertinent History of Current Functional Problem: 87 y.o. female s/p fall with subsequent R superior and inferior pubic rami fractures and nondisplaced subcaptial femoral neck fracture perviously fixated. Nonoperative treatment recommended with orders for WBAT on RLE. 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    Start [id number]  End Time: 0951  Total Treatment Time: 18 minutes    Pt Start Position/alarms: Pt supine in bed. No alarm.  Pt End Position/alarms:  Pt up in chair and positioned for comfort with call light, phone and hydration in reach. All needs met. Alarm in place.       Assessment:       Pt actively participated in skilled PT session focused on transfer training supine to sit and sit <> stand, gait training with FWW, and strengthening RLE.  Pt able to access EOB with MOD A>MINA with head of bed elevated and rail with increased time to control pain and to improve her ability to actively assist. Once on EOB, pt able to complete sit to stand with MIN A and prepare for gait training. Pt progressed to ambulating 16 ft with MIN A>CGA with improved weight acceptance onto RLE with pt able to consistently complete step to gait pattern. Ongoing services warranted to improve pt's RLE strength, to improve her independence with bed mobility and transfers, to improve her gait mechanics and independence with need to progress to ambulating  household distances and to progress pt to negotiating stairs in order to access her home at discharge.  Pt would benefit from inpatient rehabilitation stay prior to discharge home with home health.     Discharge Needs:   Equipment Recommendation: TBD    Discharge Disposition: To be determined    Plan:   Continue to follow patient according to established plan of care.  The risks/benefits of therapy have been discussed with the patient/caregiver and he/she is in agreement with the established plan of care.     Subjective & Objective:     Pt agreeable to session. Pt able to access EOB with MOD A>MINA with head of bed elevated and rail with increased time to control pain and to improve her ability to actively assist. Once on EOB, pt able to complete sit to stand with MIN A and prepare for gait training. Pt progressed to ambulating 16 ft with MIN A>CGA with improved weight acceptance onto RLE with pt able to consistently complete step to gait pattern. Pt completed exercises per log with improved muscle activation noted compared to initial evaluation. Ongoing training necessary.     Pain: Pt reports no pain at rest and 9/10 pain in R hip with weightbearing activities.     Supine to Sit: [employer], Mod Assist  Sit to Stand: Min Assist  Stand to Sit: Technical sales engineer  Ambulation Device: Conservation officer, nature  Ambulation Assist: Catering manager, Min Assist  Ambulation Distance: 16 ft     Lower Extremity Exercises  RLE Sitting:  LAQ sets: x 15  SHF: x 15 with MIN A  Glute Sets: x 15  Ankle pumps: x 15  Hip Adduction: x 15 with pillow  Hip Abduction: x 15 with therapist applied resistance      Physical Therapy Goals:   Bed Mobility Goal, Activity Type: supine to sit/sit to supine, roll left/roll right  Bed Mobility Goal, Independence Level: modified independence   Bed Mobility Goal, Assistive Device: least restrictive assistive device    Transfer Training Goal, Activity Type: bed-to-chair/chair-to-bed, sit-to-stand/stand-to-sit  Transfer Training Goal, Independence Level: modified independence   Transfer Training Goal,  Assist Device: walker, rolling    Gait Training  Goal, Independence Level: stand-by assistance   Gait Training  Goal, Assist Device: walker, rolling   Gait Training  Goal, Distance to Achieve: 100    Stairs Training Goal, Independence Level: contact guard assist   Stairs Training Goal, Assist Device: handrail, left, handrail, right (One rail)   Stairs Training Goal, Number of Stairs to Achieve: 2      Therapist:   [name], PT [phone]

## 2021-09-27 NOTE — Progress Notes (Signed)
[facility]  Progress Note    Theresa Greene  Date of service: 09/27/2021  Date of Admission:  09/25/2021  Hospital Day:  LOS: 2 days     Subjective:  Patient is without complaint this morning.  Her pain is managed.  She denies nausea this morning.  She actually states she was able to ambulate to the bathroom with a walker.    Review of Systems:    Review of Systems   Constitutional: Negative for chills, diaphoresis, fever, malaise/fatigue and weight loss.   HENT: Negative.    Eyes: Negative.    Respiratory: Negative for cough, hemoptysis, sputum production, shortness of breath and wheezing.    Cardiovascular: Negative for chest pain, palpitations, orthopnea, claudication, leg swelling and PND.   Gastrointestinal: Negative for abdominal pain, blood in stool, constipation, diarrhea, heartburn, melena, nausea and vomiting.   Genitourinary: Negative.  Negative for dysuria, frequency and urgency.   Musculoskeletal: Positive for falls, joint pain and myalgias. Negative for back pain and neck pain.   Skin: Negative.    Neurological: Negative for dizziness, tingling, tremors, sensory change, speech change, focal weakness, seizures, loss of consciousness, weakness and headaches.   Endo/Heme/Allergies: Negative.    Psychiatric/Behavioral: Negative.          Objective:     Vital Signs:  Vitals:    09/27/21 0400 09/27/21 0800 09/27/21 0839 09/27/21 0844   BP: 126/72 137/74     Pulse: 69 70     Resp: 18 18  18    Temp: 36.7 C (98.1 F) 36.8 C (98.2 F)     SpO2: 93% 98% 93%    Weight:       Height:       BMI:                I/O:  I/O last 24 hours:      Intake/Output Summary (Last 24 hours) at 09/27/2021 0923  Last data filed at 09/27/2021 0800  Gross per 24 hour   Intake 1420 ml   Output --   Net 1420 ml         acetaminophen (TYLENOL) tablet, 650 mg, Oral, Q6H PRN  amiodarone (CORDARONE) tablet, 100 mg, Oral, Daily  amLODIPine (NORVASC) tablet, 5 mg, Oral, Daily  atorvastatin (LIPITOR) tablet, 20 mg, Oral,  Daily  clopidogrel (PLAVIX) 75 mg tablet, 75 mg, Oral, Daily  docusate sodium (COLACE) capsule, 100 mg, Oral, 2x/day  enoxaparin PF (LOVENOX) 30 mg/0.3 mL SubQ injection, 30 mg, Subcutaneous, Daily  ergocalciferol-vitamin D2 (DRISDOL) capsule, 50,000 Units, Oral, Q7 Days  ezetimibe (ZETIA) tablet, 10 mg, Oral, Daily  gabapentin (NEURONTIN) capsule, 100 mg, Oral, 2x/day  HYDROcodone-acetaminophen (NORCO) 5-325 mg per tablet, 1 Tablet, Oral, Q4H PRN  HYDROmorphone (DILAUDID) 0.5 mg/0.5 mL injection, 0.5 mg, Intravenous, Q3H PRN  losartan (COZAAR) tablet, 50 mg, Oral, Daily  metoprolol succinate (TOPROL-XL) 24 hr extended release tablet, 100 mg, Oral, Daily  NS flush syringe, 10 mL, Intracatheter, Q8HRS  NS flush syringe, 10 mL, Intracatheter, Q1H PRN  ondansetron (ZOFRAN) 2 mg/mL injection, 4 mg, Intravenous, Q4H PRN  pancreatic enzyme replacement (CREON) 36,000 units lipase per cap, 1 Capsule, Oral, 3x/day-Meals  pantoprazole (PROTONIX) delayed release tablet, 40 mg, Oral, Daily  sodium bicarbonate tablet, 650 mg, Oral, 2x/day        Physical Exam:    Physical Exam  Vitals and nursing note reviewed.   Constitutional:       Appearance: She is ill-appearing.   HENT:  Head: Normocephalic and atraumatic.      Right Ear: Tympanic membrane normal.      Left Ear: Tympanic membrane normal.      Nose: Nose normal.      Mouth/Throat:      Mouth: Mucous membranes are moist.   Eyes:      Pupils: Pupils are equal, round, and reactive to light.   Neck:      Vascular: No carotid bruit.   Cardiovascular:      Rate and Rhythm: Normal rate and regular rhythm.      Pulses: Normal pulses.      Heart sounds: Normal heart sounds. No murmur heard.    No friction rub. No gallop.   Pulmonary:      Effort: Pulmonary effort is normal. No respiratory distress.      Breath sounds: Normal breath sounds. No stridor. No wheezing, rhonchi or rales.   Chest:      Chest wall: No tenderness.   Abdominal:      General: Bowel sounds are normal. There  is no distension.      Palpations: Abdomen is soft. There is no mass.      Tenderness: There is no abdominal tenderness. There is no guarding or rebound.      Hernia: No hernia is present.   Musculoskeletal:         General: Tenderness and deformity present. Normal range of motion.      Cervical back: Normal range of motion and neck supple. No rigidity or tenderness.      Comments: Pelvic shows no obvious deformity or crepitus on examination.  She does have marked tenderness over her right hip region.  Noted minimal rotation.   Lymphadenopathy:      Cervical: No cervical adenopathy.   Skin:     General: Skin is warm and dry.      Capillary Refill: Capillary refill takes less than 2 seconds.   Neurological:      General: No focal deficit present.      Mental Status: She is alert and oriented to person, place, and time.      Cranial Nerves: No cranial nerve deficit.      Sensory: No sensory deficit.      Motor: Weakness present.      Coordination: Coordination normal.      Gait: Gait abnormal.      Deep Tendon Reflexes: Reflexes normal.   Psychiatric:         Mood and Affect: Mood normal.         Behavior: Behavior normal.         Thought Content: Thought content normal.         Judgment: Judgment normal.           Labs:  Results for orders placed or performed during the hospital encounter of 09/25/21 (from the past 24 hour(s))   CBC   Result Value Ref Range    WBC 12.6 (H) 3.7 - 11.0 x10^3/uL    RBC 3.17 (L) 3.85 - 5.22 x10^6/uL    HGB 10.0 (L) 11.5 - 16.0 g/dL    HCT 30.6 (L) 34.8 - 46.0 %    MCV 96.5 78.0 - 100.0 fL    MCH 31.5 26.0 - 32.0 pg    MCHC 32.7 31.0 - 35.5 g/dL    RDW-CV 15.2 11.5 - 15.5 %    PLATELETS 226 150 - 400 x10^3/uL    MPV 10.3 8.7 - 12.5 fL  BASIC METABOLIC PANEL - AM ONCE   Result Value Ref Range    SODIUM 137 136 - 145 mmol/L    POTASSIUM 4.3 3.5 - 5.1 mmol/L    CHLORIDE 104 96 - 111 mmol/L    CO2 TOTAL 25 23 - 31 mmol/L    ANION GAP 8 4 - 13 mmol/L    CALCIUM 8.7 (L) 8.8 - 10.2 mg/dL     GLUCOSE 98 65 - 130 mg/dL    BUN 51 (H) 8 - 25 mg/dL    CREATININE 8.65 (H) 0.60 - 1.05 mg/dL    BUN/CREA RATIO 30 (H) 6 - 22    ESTIMATED GFR 30 (L) >=60 mL/min/BSA            Imaging:           Microbiology:  No results found for any visits on 09/25/21 (from the past 96 hour(s)).        Assessment/ Plan:     Active Hospital Problems    Diagnosis   . Primary Problem: Hip fracture (CMS HCC)   . Pelvic fracture (CMS HCC)   . Debility      Patient is hemodynamically stable.  Pain is managed.  He hemoglobin is down somewhat but reasonable.  Will plan to recheck in a few days.  I appreciate Dr. Theora Gianotti recommendations.  Will continue physical therapy  Disposition Planning:   Tentative plan is for placement swing bed when able    Glori Luis, DO    This note was partially generated using MModal Fluency Direct system, and there may be some incorrect words, spellings, and punctuation that were not noted in checking the note before saving

## 2021-09-27 NOTE — PT Treatment (Signed)
[place]  Physical Therapy Progress Note    Patient Name: Theresa Greene  Date of Birth: 1935/08/13  Height:  167.6 cm (_0 )  Weight:  49.1 kg (108 lb 3.2 oz)    Room/Bed: 701/B  Payor: Payor: MEDICARE / Plan: MEDICARE PART A AND B / Product Type: Medicare /     Referring physician: No ref. provider found  Pertinent History of Current Functional Problem: 87 y.o. female s/p fall with subsequent R superior and inferior pubic rami fractures and nondisplaced subcaptial femoral neck fracture perviously fixated. Nonoperative treatment recommended with orders for WBAT on RLE. 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    Start Time: 1506  End Time: 1519  Total Treatment Time: 13 minutes    Pt Start Position/alarms: supine in bed, call light in reach  Pt End Position/alarms:  Reclined in chair, call light in reach and needs met.         Assessment:       Pt completed gait and transfer from bed to chair. Pt needing min A with getting R LE out of bed, but then needs mod A to get from supine to sit even with head of bed elevated. Next raised bed to for sit to stand which was CGA. She then walked to door way of room with FWW and CGA, cues given to push up thru walker to unweight R LE. Pt fatigued and ready to sit down in chair. Pillow placed under legs and reclined back in chair. Upon exit all needs met and call light in reach. Tabs alarm attached. Will continue per POC.       Discharge Needs:   Equipment Recommendation: TBD    Discharge Disposition: To be determined    Plan:   Continue to follow patient according to established plan of care.  The risks/benefits of therapy have been discussed with the patient/caregiver and he/she is in agreement with the established plan of care.     Subjective & Objective:     Pt reporting pain level as 8-10/10 in R  LE. She agreed to transfer and gait in room. She relied on assistant from this PTA to get out of bed with min A of R LE and mod A to sit up from bed.     Bed Mobility: Min Assist  Supine to Sit: Mod Assist    Sit to Stand: Contact Guard  Stand to Sit: Biomedical scientist  Ambulation Device: Conservation officer, nature  Ambulation Assist: Catering manager, Min Assist  Ambulation Distance: 81f          Physical Therapy Goals:   Bed Mobility Goal, Activity Type: supine to sit/sit to supine, roll left/roll right  Bed Mobility Goal, Independence Level: modified independence   Bed Mobility Goal, Assistive Device: least restrictive assistive device    Transfer Training Goal, Activity Type: bed-to-chair/chair-to-bed, sit-to-stand/stand-to-sit  Transfer Training Goal, Independence Level: modified independence   Transfer Training Goal, Assist Device: walker, rolling    Gait Training  Goal, Independence Level: stand-by assistance   Gait Training  Goal, Assist Device: walker, rolling   Gait Training  Goal, Distance to Achieve: 100    Stairs Training Goal, Independence Level: contact guard assist   Stairs Training Goal, Assist Device: handrail, left, handrail, right (One rail)   Stairs Training Goal,  Number of Stairs to Achieve: 2      Therapist:   Luiz Blare, PTA

## 2021-09-28 ENCOUNTER — Other Ambulatory Visit: Payer: Self-pay

## 2021-09-28 ENCOUNTER — Encounter (HOSPITAL_COMMUNITY): Payer: Self-pay | Admitting: Internal Medicine

## 2021-09-28 ENCOUNTER — Ambulatory Visit (INDEPENDENT_AMBULATORY_CARE_PROVIDER_SITE_OTHER): Payer: Self-pay | Admitting: Ophthalmology

## 2021-09-28 ENCOUNTER — Inpatient Hospital Stay
Admission: RE | Admit: 2021-09-28 | Discharge: 2021-10-09 | DRG: 947 | Disposition: A | Discharge status: Rehabilitation Facility | Payer: Medicare Other | Source: Ambulatory Visit | Attending: Internal Medicine | Admitting: Internal Medicine

## 2021-09-28 DIAGNOSIS — I129 Hypertensive chronic kidney disease with stage 1 through stage 4 chronic kidney disease, or unspecified chronic kidney disease: Secondary | ICD-10-CM | POA: Diagnosis present

## 2021-09-28 DIAGNOSIS — Z955 Presence of coronary angioplasty implant and graft: Secondary | ICD-10-CM

## 2021-09-28 DIAGNOSIS — Z7902 Long term (current) use of antithrombotics/antiplatelets: Secondary | ICD-10-CM

## 2021-09-28 DIAGNOSIS — M791 Myalgia, unspecified site: Secondary | ICD-10-CM | POA: Diagnosis present

## 2021-09-28 DIAGNOSIS — I2510 Atherosclerotic heart disease of native coronary artery without angina pectoris: Secondary | ICD-10-CM | POA: Diagnosis present

## 2021-09-28 DIAGNOSIS — H9190 Unspecified hearing loss, unspecified ear: Secondary | ICD-10-CM | POA: Diagnosis present

## 2021-09-28 DIAGNOSIS — R6251 Failure to thrive (child): Secondary | ICD-10-CM

## 2021-09-28 DIAGNOSIS — G629 Polyneuropathy, unspecified: Secondary | ICD-10-CM

## 2021-09-28 DIAGNOSIS — E785 Hyperlipidemia, unspecified: Secondary | ICD-10-CM | POA: Diagnosis present

## 2021-09-28 DIAGNOSIS — Z96653 Presence of artificial knee joint, bilateral: Secondary | ICD-10-CM | POA: Diagnosis present

## 2021-09-28 DIAGNOSIS — Z950 Presence of cardiac pacemaker: Secondary | ICD-10-CM

## 2021-09-28 DIAGNOSIS — E440 Moderate protein-calorie malnutrition: Secondary | ICD-10-CM | POA: Diagnosis present

## 2021-09-28 DIAGNOSIS — Z20822 Contact with and (suspected) exposure to covid-19: Secondary | ICD-10-CM | POA: Diagnosis present

## 2021-09-28 DIAGNOSIS — S329XXA Fracture of unspecified parts of lumbosacral spine and pelvis, initial encounter for closed fracture: Secondary | ICD-10-CM | POA: Diagnosis present

## 2021-09-28 DIAGNOSIS — Z79899 Other long term (current) drug therapy: Secondary | ICD-10-CM

## 2021-09-28 DIAGNOSIS — Z9861 Coronary angioplasty status: Secondary | ICD-10-CM

## 2021-09-28 DIAGNOSIS — I10 Essential (primary) hypertension: Secondary | ICD-10-CM | POA: Diagnosis present

## 2021-09-28 DIAGNOSIS — N183 Chronic kidney disease, stage 3 unspecified (CMS HCC): Secondary | ICD-10-CM | POA: Diagnosis present

## 2021-09-28 DIAGNOSIS — J449 Chronic obstructive pulmonary disease, unspecified: Secondary | ICD-10-CM | POA: Diagnosis present

## 2021-09-28 DIAGNOSIS — Z96642 Presence of left artificial hip joint: Secondary | ICD-10-CM | POA: Diagnosis present

## 2021-09-28 DIAGNOSIS — Z8673 Personal history of transient ischemic attack (TIA), and cerebral infarction without residual deficits: Secondary | ICD-10-CM

## 2021-09-28 DIAGNOSIS — S72011A Unspecified intracapsular fracture of right femur, initial encounter for closed fracture: Secondary | ICD-10-CM | POA: Diagnosis present

## 2021-09-28 DIAGNOSIS — M069 Rheumatoid arthritis, unspecified: Secondary | ICD-10-CM | POA: Diagnosis present

## 2021-09-28 DIAGNOSIS — R5381 Other malaise: Secondary | ICD-10-CM | POA: Diagnosis present

## 2021-09-28 DIAGNOSIS — K279 Peptic ulcer, site unspecified, unspecified as acute or chronic, without hemorrhage or perforation: Secondary | ICD-10-CM

## 2021-09-28 DIAGNOSIS — G8929 Other chronic pain: Secondary | ICD-10-CM | POA: Diagnosis present

## 2021-09-28 DIAGNOSIS — S72009A Fracture of unspecified part of neck of unspecified femur, initial encounter for closed fracture: Secondary | ICD-10-CM | POA: Diagnosis present

## 2021-09-28 LAB — URINE CULTURE,ROUTINE: URINE CULTURE: >100000 — AB

## 2021-09-28 MED ORDER — LOSARTAN 50 MG TABLET
50.0000 mg | ORAL_TABLET | Freq: Every day | ORAL | Status: DC
Start: 2021-09-29 — End: 2021-10-09
  Administered 2021-09-29 – 2021-10-09 (×11): 50 mg via ORAL
  Filled 2021-09-28 (×11): qty 1

## 2021-09-28 MED ORDER — LIPASE-PROTEASE-AMYLASE 36,000-114,000-180,000 UNIT CAPSULE,DELAY REL
1.0000 | DELAYED_RELEASE_CAPSULE | Freq: Three times a day (TID) | ORAL | Status: DC
Start: 2021-09-28 — End: 2021-10-02
  Administered 2021-09-28 – 2021-10-01 (×10): 1 via ORAL
  Administered 2021-10-02: 0 via ORAL
  Filled 2021-09-28: qty 1

## 2021-09-28 MED ORDER — AMLODIPINE 5 MG TABLET
5.0000 mg | ORAL_TABLET | Freq: Every day | ORAL | Status: DC
Start: 2021-09-29 — End: 2021-10-09
  Administered 2021-09-29 – 2021-10-09 (×11): 5 mg via ORAL
  Filled 2021-09-28 (×11): qty 1

## 2021-09-28 MED ORDER — AMIODARONE 200 MG TABLET
100.0000 mg | ORAL_TABLET | Freq: Every day | ORAL | Status: DC
Start: 2021-09-29 — End: 2021-10-09
  Administered 2021-09-29 – 2021-10-09 (×11): 100 mg via ORAL
  Filled 2021-09-28 (×11): qty 1

## 2021-09-28 MED ORDER — CLOPIDOGREL 75 MG TABLET
75.0000 mg | ORAL_TABLET | Freq: Every day | ORAL | Status: DC
Start: 2021-09-29 — End: 2021-10-09
  Administered 2021-09-29 – 2021-10-09 (×11): 75 mg via ORAL
  Filled 2021-09-28 (×11): qty 1

## 2021-09-28 MED ORDER — HYDROCODONE 5 MG-ACETAMINOPHEN 325 MG TABLET
1.0000 | ORAL_TABLET | ORAL | Status: DC | PRN
Start: 2021-09-28 — End: 2021-10-09
  Administered 2021-09-28 – 2021-10-09 (×37): 1 via ORAL
  Filled 2021-09-28 (×38): qty 1

## 2021-09-28 MED ORDER — ERGOCALCIFEROL (VITAMIN D2) 1,250 MCG (50,000 UNIT) CAPSULE
50000.0000 [IU] | ORAL_CAPSULE | ORAL | Status: DC
Start: 2021-09-29 — End: 2021-10-09
  Administered 2021-09-29 – 2021-10-06 (×2): 50000 [IU] via ORAL
  Filled 2021-09-28 (×2): qty 1

## 2021-09-28 MED ORDER — METOPROLOL SUCCINATE ER 50 MG TABLET,EXTENDED RELEASE 24 HR
100.0000 mg | ORAL_TABLET | Freq: Every day | ORAL | Status: DC
Start: 2021-09-29 — End: 2021-10-09
  Administered 2021-09-29 – 2021-10-09 (×11): 100 mg via ORAL
  Filled 2021-09-28 (×11): qty 2

## 2021-09-28 MED ORDER — EZETIMIBE 10 MG TABLET
10.0000 mg | ORAL_TABLET | Freq: Every day | ORAL | Status: DC
Start: 2021-09-29 — End: 2021-10-09
  Administered 2021-09-29 – 2021-10-09 (×11): 10 mg via ORAL
  Filled 2021-09-28 (×11): qty 1

## 2021-09-28 MED ORDER — ENOXAPARIN 30 MG/0.3 ML SUBCUTANEOUS SYRINGE
30.0000 mg | INJECTION | Freq: Every day | SUBCUTANEOUS | Status: DC
Start: 2021-09-29 — End: 2021-10-09
  Administered 2021-09-29 – 2021-10-09 (×11): 30 mg via SUBCUTANEOUS
  Filled 2021-09-28 (×11): qty 0.3

## 2021-09-28 MED ORDER — CEPHALEXIN 500 MG CAPSULE
500.0000 mg | ORAL_CAPSULE | Freq: Two times a day (BID) | ORAL | Status: DC
Start: 2021-09-28 — End: 2021-09-28
  Administered 2021-09-28: 500 mg via ORAL
  Filled 2021-09-28: qty 1

## 2021-09-28 MED ORDER — CEPHALEXIN 500 MG CAPSULE
500.0000 mg | ORAL_CAPSULE | Freq: Two times a day (BID) | ORAL | Status: AC
Start: 2021-09-28 — End: 2021-10-05
  Administered 2021-09-28 – 2021-10-05 (×14): 500 mg via ORAL
  Filled 2021-09-28 (×14): qty 1

## 2021-09-28 MED ORDER — GABAPENTIN 100 MG CAPSULE
100.0000 mg | ORAL_CAPSULE | Freq: Two times a day (BID) | ORAL | Status: DC
Start: 2021-09-28 — End: 2021-10-09
  Administered 2021-09-28 – 2021-10-09 (×22): 100 mg via ORAL
  Filled 2021-09-28 (×22): qty 1

## 2021-09-28 MED ORDER — ACETAMINOPHEN 325 MG TABLET
650.0000 mg | ORAL_TABLET | Freq: Four times a day (QID) | ORAL | Status: DC | PRN
Start: 2021-09-28 — End: 2021-10-09

## 2021-09-28 MED ORDER — DOCUSATE SODIUM 100 MG CAPSULE
100.0000 mg | ORAL_CAPSULE | Freq: Two times a day (BID) | ORAL | Status: DC
Start: 2021-09-28 — End: 2021-10-09
  Administered 2021-09-28 – 2021-10-05 (×15): 100 mg via ORAL
  Administered 2021-10-06: 0 mg via ORAL
  Administered 2021-10-06 – 2021-10-09 (×6): 100 mg via ORAL
  Filled 2021-09-28 (×22): qty 1

## 2021-09-28 MED ORDER — PANTOPRAZOLE 40 MG TABLET,DELAYED RELEASE
40.0000 mg | DELAYED_RELEASE_TABLET | Freq: Every day | ORAL | Status: DC
Start: 2021-09-29 — End: 2021-10-09
  Administered 2021-09-29 – 2021-10-09 (×11): 40 mg via ORAL
  Filled 2021-09-28 (×11): qty 1

## 2021-09-28 MED ORDER — ONDANSETRON HCL (PF) 4 MG/2 ML INJECTION SOLUTION
4.0000 mg | Freq: Once | INTRAMUSCULAR | Status: DC | PRN
Start: 2021-09-28 — End: 2021-09-28

## 2021-09-28 MED ORDER — ONDANSETRON HCL (PF) 4 MG/2 ML INJECTION SOLUTION
4.0000 mg | INTRAMUSCULAR | Status: DC | PRN
Start: 2021-09-28 — End: 2021-10-09

## 2021-09-28 MED ORDER — ATORVASTATIN 10 MG TABLET
20.0000 mg | ORAL_TABLET | Freq: Every day | ORAL | Status: DC
Start: 2021-09-29 — End: 2021-10-09
  Administered 2021-09-29 – 2021-10-09 (×11): 20 mg via ORAL
  Filled 2021-09-28 (×11): qty 2

## 2021-09-28 MED ORDER — MELATONIN 3 MG TABLET
3.0000 mg | ORAL_TABLET | Freq: Every evening | ORAL | Status: DC
Start: 2021-09-28 — End: 2021-10-09
  Administered 2021-09-28 – 2021-10-08 (×11): 3 mg via ORAL
  Filled 2021-09-28 (×11): qty 1

## 2021-09-28 NOTE — Care Plan (Signed)
[place]  Physical Therapy Progress Note    Patient Name: Theresa Greene  Date of Birth: [birth year]  Height:  167.6 cm (_0 )  Weight:  49.1 kg (108 lb 3.2 oz)    Room/Bed: 701/B  Payor: Payor: MEDICARE / Plan: MEDICARE PART A AND B / Product Type: Medicare /     Referring physician: No ref. provider found  Pertinent History of Current Functional Problem: 87 y.o. female s/p fall with subsequent R superior and inferior pubic rami fractures and nondisplaced subcaptial femoral neck fracture perviously fixated. Nonoperative treatment recommended with orders for WBAT on RLE. 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    Start Time:1103   End Time: 1115  Total Treatment Time: 12 minutes    Pt Start Position/alarms: Pt supine in bed. No alarm.  Pt End Position/alarms:  Pt up in chair and positioned for comfort with call light, phone and hydration in reach. All needs met. Alarm in place.       Assessment:       Pt actively participated in skilled PT session focused on transfer training supine to sit, sit <> stand, and bed to chair with FWW.  Pt able to access EOB with MOD A with head of bed elevated and rail with increased time to control pain and to improve her ability to actively assist. Once on EOB, pt able to complete sit to stand with CGA and complete transfer bed to chair utilizing stand step technique with CGA with FWW. Pt declined gait training until after pain medication administered. Ongoing services warranted to improve pt's RLE strength, to improve her independence with bed mobility and transfers, to improve her gait mechanics and independence with need to progress to ambulating  household distances and to progress pt to negotiating stairs in order to access her home at discharge. Pt would benefit from inpatient rehabilitation stay prior  to discharge home with home health.     Discharge Needs:   Equipment Recommendation: TBD    Discharge Disposition: To be determined    Plan:   Continue to follow patient according to established plan of care.  The risks/benefits of therapy have been discussed with the patient/caregiver and he/she is in agreement with the established plan of care.     Subjective & Objective:     Pt agreeable to session. Pt completed session per log with increased c/o pain in R groin. RN notified of pt's request for pain medication during session. Plan to complete gait training once medication administered.     Pain: Pt reports no pain at rest and 9/10 pain in R groin. Pain medication requested.    Supine to Sit: Mod Assist  Sit to Stand: Contact Guard  Stand to Sit: Contact Guard  Bed to Chair: Catering manager with FWW     Physical Therapy Goals:   Bed Mobility Goal, Activity Type: supine to sit/sit to supine, roll left/roll right  Bed Mobility Goal, Independence Level: modified independence   Bed Mobility Goal, Assistive Device: least restrictive assistive device    Transfer Training Goal, Activity Type: bed-to-chair/chair-to-bed, sit-to-stand/stand-to-sit  Transfer Training Goal, Independence Level: modified independence   Transfer Training Goal, Assist Device: walker, rolling    Gait Training  Goal, Independence Level: stand-by assistance   Gait Training  Goal, Assist Device: walker, rolling   Gait  Training  Goal, Distance to Achieve: 100    Stairs Training Goal, Independence Level: contact guard assist   Stairs Training Goal, Assist Device: handrail, left, handrail, right (One rail)   Stairs Training Goal, Number of Stairs to Achieve: 2      Therapist:   [name], PT [phone]

## 2021-09-28 NOTE — Telephone Encounter (Signed)
Message sent to MD for advisement. Audry Pili, RN  09/28/2021, 09:43

## 2021-09-28 NOTE — Care Plan (Signed)
[facility]  Care Plan Note    Situation: start poc    Intervention: start poc    Response: start poc      Theresa Greene J Ashanti Ratti, RN

## 2021-09-28 NOTE — H&P (Signed)
[facility]  Admission H&P        Date of Service:  09/28/2021  Theresa Greene [id number] y.o. female  Date of Admission:  09/28/2021  Date of Birth:  [birth year]    Chief complaint: No chief complaint on file.    Chief Complaint:  Debility    HPI: Theresa Greene is a 87 y.o., White female who presented to acute bed after fall with subsequent subcapital right hip fracture and inferior posterior pubic rami fractures.  Patient is stable with coarse only complicated by UTI with Klebsiella.  She does have multiple medical problems chronically/acutely debilitated with marked pain and will be transferred to swing bed for further physical therapy, rehabilitation.    She has been evaluated by Dr. Owens Shark and felt not to be a surgical candidate.    History:    Past Medical:    Past Medical History:   Diagnosis Date   . Arthritis    . Asthma    . Back problem    . CAD S/P percutaneous coronary angioplasty 2010    Pt reports 5 years ago   . Charcot's joint of foot, left 08/06/2014   . Colon polyp    . CVA (cerebrovascular accident) (CMS [place]) 1995    no residual deficits   . Deaf    . Diverticulitis of colon    . Gastroesophageal reflux disease 08/06/2014   . H/O hearing loss    . History of cardiac pacemaker 08/06/2014   . HTN (hypertension)    . Hyperlipidemia 12/25/2019   . Muscle weakness    . Neuropathy (CMS HCC)    . Pacemaker    . Paroxysmal atrial fibrillation (CMS HCC) 08/06/2014   . PUD (peptic ulcer disease) 08/06/2014    C/b GIB [place] 2015   . Rheumatoid arthritis (CMS [place]) 08/06/2014   . Stage 3 chronic kidney disease (CMS [place]) 12/25/2019    Last Assessment & Plan:  Renal function stable, at baseline Stable electrolytes; restart sodium bicarb 650 mg BID  Continue renal protective ARB Discussed CKD risk factor modifications, including tight BP/glucose control, low protein diet, and avoidance of NSAIDs   . Stented coronary artery    . Vitamin D deficiency 12/25/2019   . Wears glasses      Past Surgical:     Past Surgical History:   Procedure Laterality Date   . ANKLE SURGERY Left     repair with metal   . CATARACT EXTRACTION      unsure which eye but thinks she only had one eye done   . CORONARY ARTERY ANGIOPLASTY  2011   . ESOPHAGOGASTRODUODENOSCOPY     . HIP SURGERY Right     repair with metal   . HX BACK SURGERY      lower back x 3 no metal   . HX COLONOSCOPY     . HX HIP REPLACEMENT Left    . HX NISSEN FUNDOPLICATION  [id number]    then three others to repair problems   . [place]    with common duxt exploration   . HX PACEMAKER INSERTION  2019   . HX SHOULDER SURGERY Bilateral     scopes   . KNEE ARTHROPLASTY Bilateral    . 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 PANCREATIC PSEUDOCYST DRAINAGE  1971   . TOTAL ABDOMINAL HYSTERECTOMY  Family:    Family Medical History:     Problem Relation (Age of Onset)    Brain cancer Mother    Ovarian Cancer Mother    Pancreatic Cancer Father        Social:   reports that she has never smoked. She has never used smokeless tobacco. She reports that she does not drink alcohol and does not use drugs.    Allergies   Allergen Reactions   . Iodinated Contrast Media Anaphylaxis   . Iv Contrast Anaphylaxis     makese her serverly sick   . Etodolac NO Steroids unless approved by Attending Physician   . Symbicort [Budesonide-Formoterol]    . Codeine  Other Adverse Reaction (Add comment) and Nausea/ Vomiting     Stomach pain  Stomach pain       Medications Prior to Admission     Prescriptions    amiodarone (PACERONE) 200 mg Oral Tablet    Take 0.5 Tablets (100 mg total) by mouth Once a day for 90 days    amLODIPine (NORVASC) 5 mg Oral Tablet    Take 1 Tablet (5 mg total) by mouth Once a day    atorvastatin (LIPITOR) 20 mg Oral Tablet    Take 1 Tablet (20 mg total) by mouth Once a day for 90 days    clopidogreL (PLAVIX) 75 mg Oral Tablet    Take 1 Tablet (75 mg total) by mouth Once a day for 90 days    CREON  36,000-114,000- 180,000 unit Oral Capsule, Delayed Release(E.C.)    1 Capsule Three times daily with meals    Dexlansoprazole (DEXILANT) 60 mg Oral Cap, Delayed Rel., Multiphasic    Take 1 Capsule (60 mg total) by mouth Once a day    dicyclomine HCl (BENTYL ORAL)    Take 60 mg by mouth Once a day    ergocalciferol, vitamin D2, (DRISDOL) 1,250 mcg (50,000 unit) Oral Capsule    Take 1 Capsule (50,000 Units total) by mouth Every 7 days    ezetimibe (ZETIA) 10 mg Oral Tablet    Take 1 Tablet (10 mg total) by mouth Once a day for 90 days    gabapentin (NEURONTIN) 100 mg Oral Capsule    Take 1 Capsule (100 mg total) by mouth Twice daily    gemfibroziL (LOPID) 600 mg Oral Tablet    Take 1 Tablet (600 mg total) by mouth Twice a day before meals for 90 days    losartan (COZAAR) 50 mg Oral Tablet    Take 0.5 Tablets (25 mg total) by mouth Twice daily for 90 days    metoprolol succinate (TOPROL-XL) 100 mg Oral Tablet Sustained Release 24 hr    Take 1 Tablet (100 mg total) by mouth Once a day    metoprolol tartrate (LOPRESSOR) 25 mg Oral Tablet    Take 1 Tablet (25 mg total) by mouth Once a day    sodium bicarbonate 650 mg Oral Tablet    Take 1 Tablet (650 mg total) by mouth Twice daily        No current facility-administered medications for this encounter.      ROS:   Review of Systems   Constitutional: Positive for malaise/fatigue. Negative for chills, diaphoresis, fever and weight loss.   HENT: Negative.  Negative for congestion, ear discharge, ear pain, hearing loss, nosebleeds, sinus pain and tinnitus.    Eyes: Negative for blurred vision, double vision, photophobia, pain and discharge.   Respiratory: Negative for cough, hemoptysis,  sputum production, shortness of breath and wheezing.    Cardiovascular: Negative for chest pain, palpitations, orthopnea, claudication, leg swelling and PND.   Gastrointestinal: Negative for abdominal pain, blood in stool, constipation, diarrhea, heartburn, melena, nausea and vomiting.    Genitourinary: Negative for dysuria, flank pain, frequency, hematuria and urgency.   Musculoskeletal: Positive for back pain, falls, joint pain and neck pain. Negative for myalgias.   Skin: Negative.  Negative for itching and rash.   Neurological: Positive for weakness. Negative for dizziness, tingling, tremors, sensory change, speech change, loss of consciousness and headaches.   Endo/Heme/Allergies: Does not bruise/bleed easily.   Psychiatric/Behavioral: Negative.        All other systems negative unless marked.         Exam:  Vitals:    09/28/21 1318   BP: (!) 97/54   Pulse: 70   Resp: 18   Temp: 36.9 C (98.4 F)   Weight: 48.8 kg (107 lb 9.6 oz)   Height: 1.676 m (5\' 6" )   BMI: 17.4           No intake or output data in the 24 hours ending 09/28/21 1347       Physical Exam  Vitals reviewed.   Constitutional:       General: She is not in acute distress.     Appearance: Normal appearance. She is ill-appearing.   HENT:      Head: Normocephalic and atraumatic.      Left Ear: Tympanic membrane normal.      Nose: Nose normal.      Mouth/Throat:      Mouth: Mucous membranes are moist.   Eyes:      Extraocular Movements: Extraocular movements intact.      Pupils: Pupils are equal, round, and reactive to light.   Neck:      Vascular: No carotid bruit.   Cardiovascular:      Rate and Rhythm: Normal rate and regular rhythm.      Pulses: Normal pulses.      Heart sounds: Murmur heard.     No friction rub. No gallop.      Comments: She is primarily in a paced rhythm  Pulmonary:      Effort: Pulmonary effort is normal. No respiratory distress.      Breath sounds: Normal breath sounds. No stridor. No wheezing or rhonchi.   Abdominal:      General: Abdomen is flat. Bowel sounds are normal. There is no distension.      Palpations: Abdomen is soft. There is no mass.      Tenderness: There is no abdominal tenderness. There is no guarding or rebound.      Hernia: No hernia is present.   Musculoskeletal:         General:  Tenderness and signs of injury present. No swelling or deformity.      Cervical back: Normal range of motion and neck supple. No rigidity or tenderness.      Right lower leg: Edema present.      Left lower leg: No edema.      Comments: Tenderness to region of right greater trochanter with out's obvious deformity.  She has minimal pelvic tenderness and no crepitus noted   Lymphadenopathy:      Cervical: No cervical adenopathy.   Skin:     General: Skin is warm and dry.      Capillary Refill: Capillary refill takes less than 2 seconds.  Coloration: Skin is not jaundiced or pale.      Findings: Erythema present. No bruising.   Neurological:      Mental Status: She is alert.          Labs:     No results found for this or any previous visit (from the past 24 hour(s)).     Imaging Studies:    No orders to display       DNR Status:  Prior    Assessment/Plan:   Debility  Status post pelvic fracture  Right hip fracture  ASHD  Patient be transferred to swing bed for physical therapy and rehabilitation, pain management and daily care.  Home medicines will be continued as well as DVT prophylaxis  DVT/PE Prophylaxis: .  Lovenox  Disposition Planning:  Home when improved    Rex Kras, DO    This note was partially generated using MModal Fluency Direct system, and there may be some incorrect words, spellings, and punctuation that were not noted in checking the note before saving

## 2021-09-28 NOTE — Care Plan (Signed)
[facility]  Care Plan Note    Situation: fall with fx    Intervention: nursing care and assessment, pain control,meal set up, skin care    Response: ongoing,patient states she just wants to be pain free      Baird Cancer, RN

## 2021-09-28 NOTE — Care Plan (Signed)
[place]  Physical Therapy Progress Note    Patient Name: Theresa Greene  Date of Birth: [birth year]  Height:  167.6 cm (5' 6" )  Weight:  49.1 kg (108 lb 3.2 oz)    Room/Bed: 701/B  Payor: Payor: MEDICARE / Plan: MEDICARE PART A AND B / Product Type: Medicare /     Referring physician: No ref. provider found  Pertinent History of Current Functional Problem: 87 y.o. female s/p fall with subsequent R superior and inferior pubic rami fractures and nondisplaced subcaptial femoral neck fracture perviously fixated. Nonoperative treatment recommended with orders for WBAT on RLE. 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    Start Time:1143  End Time: 1155  Total Treatment Time: 12 minutes    Pt Start Position/alarms: Pt up in chair. Alarm in place.   Pt End Position/alarms:  Pt up in chair and positioned for comfort with call light, phone and hydration in reach. All needs met. Alarm in place.       Assessment:       Pt actively participated in skilled PT session focused on transfer training  sit <> stand and gait training with FWW. Pt able to complete sit to stand from chair with CGA with progression to gait training 44 ft with FWW with CGA >MIN A this session; however, pt challenged with completing last 15 ft due to pain and fatigue with 3 standing rest breaks required with increased trunk flexion and decline in mechanics including decreased weight acceptance onto RLE and decreased ability to adequately clear foot to take step on L. Ongoing services warranted to improve pt's RLE strength, to improve her independence with bed mobility and transfers, to improve her gait mechanics and independence with need to progress to ambulating  household distances and to progress pt to negotiating stairs in order to access her home at discharge. Pt would benefit  from inpatient rehabilitation stay prior to discharge home with home health.     Discharge Needs:   Equipment Recommendation: TBD    Discharge Disposition: To be determined    Plan:   Continue to follow patient according to established plan of care.  The risks/benefits of therapy have been discussed with the patient/caregiver and he/she is in agreement with the established plan of care.     Subjective & Objective:     Pt agreeable to session. Pt completed session per log with increased pain in R groin and increased muscular fatigue with ambulation. Pt up in chair and positioned for comfort with all needs in reach and alarm engaged at end of session.     Pain: Pt reports no pain at rest and 9/10 pain in R hip with weightbearing activities. Pain medication coordinated with session.       Sit to Stand: Min Assist  Stand to Sit: Technical sales engineer  Ambulation Device: Conservation officer, nature  Ambulation Assist: Catering manager, Min Assist  Ambulation Distance: 44 ft           Physical Therapy Goals:   Bed Mobility Goal, Activity Type: supine to sit/sit to supine, roll left/roll right  Bed Mobility Goal, Independence Level: modified independence   Bed Mobility Goal, Assistive Device: least restrictive assistive device    Transfer Training Goal, Activity Type: bed-to-chair/chair-to-bed, sit-to-stand/stand-to-sit  Transfer Training Goal, Independence Level: modified independence  Transfer Training Goal, Assist Device: walker, rolling    Gait Training  Goal, Independence Level: stand-by assistance   Gait Training  Goal, Assist Device: walker, rolling   Gait Training  Goal, Distance to Achieve: 100    Stairs Training Goal, Independence Level: contact guard assist   Stairs Training Goal, Assist Device: handrail, left, handrail, right (One rail)   Stairs Training Goal, Number of Stairs to Achieve: 2      Therapist:   [name], PT [phone]

## 2021-09-28 NOTE — Telephone Encounter (Signed)
Called patient's daughter. Call went to voicemail.Message left with call back number. Audry Pili, RN  09/28/2021, 10:09        Per Dr. Fransico Michael:  If her local eye doctor is okay with that, that works for me. The [place] follow up is there if she needs it.

## 2021-09-28 NOTE — Telephone Encounter (Signed)
Spoke to pt's daughter. Advised per Dr. Annamarie Dawley. Pt's daughter stated that pt is going to be seen locally. Garey Ham, RN  09/28/2021, 10:35      Per Dr. Annamarie Dawley:  If her local eye doctor is okay with that, that works for me. The [place] follow up is there if she needs it.

## 2021-09-28 NOTE — Discharge Summary (Addendum)
[facility]  DISCHARGE SUMMARY    PATIENT NAME:  Theresa Greene, Theresa Greene  MRN:  [id number]  DOB:  [birth year]    ENCOUNTER DATE:  09/25/2021  INPATIENT ADMISSION DATE: 09/25/2021  DISCHARGE DATE: 09/28/2021    ATTENDING PHYSICIAN: Rex Kras, DO  SERVICE: [facility] HOSPITALIST  PRIMARY CARE PHYSICIAN: [facility], DO         LAY CAREGIVER:  ,  ,        PRIMARY DISCHARGE DIAGNOSIS: Hip fracture (CMS [facility])  Active Hospital Problems    Diagnosis Date Noted    Principal Problem: Hip fracture (CMS [place]) [S72.009A] 09/25/2021    Pelvic fracture (CMS [facility]) [S32.9XXA] 09/25/2021    Debility [R53.81] 09/25/2021      Resolved Hospital Problems   No resolved problems to display.     Active Non-Hospital Problems    Diagnosis Date Noted    Failure to thrive (child) 07/20/2021    Moderate protein-calorie malnutrition (CMS [place]) 07/20/2021    Closed fracture of distal end of right femur (CMS [place]) 07/20/2021    Chronic obstructive pulmonary disease, unspecified COPD type (CMS [place]) 07/20/2021    Osteoporosis 08/25/2020    Iron deficiency anemia 07/19/2020    Neuropathy (CMS [place]) 04/05/2020    Vitamin D deficiency 12/25/2019    Stage 3 chronic kidney disease (CMS [place]) 12/25/2019    Hyperlipidemia 12/25/2019    Cardiac pacemaker in situ 01/10/2017    HTN (hypertension) 01/13/2015    Rheumatoid arthritis (CMS [place]) 08/06/2014    PUD (peptic ulcer disease) 08/06/2014    History of CVA (cerebrovascular accident) 08/06/2014    History of cardiac pacemaker 08/06/2014    Gastroesophageal reflux disease 08/06/2014    Charcot's joint of foot, left 08/06/2014    CAD S/P percutaneous coronary angioplasty 08/06/2014    Paroxysmal atrial fibrillation (CMS HCC) 08/06/2014        DISCHARGE MEDICATIONS:     Current Discharge Medication List        CONTINUE these medications - NO CHANGES were made during your visit.        Details   amiodarone 200 mg Tablet  Commonly known as: PACERONE   100 mg, Oral, DAILY  Qty: 45 Tablet  Refills: 3      amLODIPine 5 mg Tablet  Commonly known as: NORVASC   5 mg, Oral, DAILY  Qty: 90 Tablet  Refills: 4     atorvastatin 20 mg Tablet  Commonly known as: LIPITOR   20 mg, Oral, DAILY  Qty: 90 Tablet  Refills: 3     BENTYL ORAL   60 mg, Oral, DAILY  Refills: 0     clopidogreL 75 mg Tablet  Commonly known as: PLAVIX   75 mg, Oral, DAILY  Qty: 90 Tablet  Refills: 3     Creon 36,000-114,000- 180,000 unit Capsule, Delayed Release(E.C.)  Generic drug: lipase-protease-amylase   1 Capsule, 3 TIMES DAILY WITH MEALS  Refills: 0     Dexlansoprazole 60 mg Cap, Delayed Rel., Multiphasic  Commonly known as: DEXILANT   60 mg, Oral, DAILY  Refills: 0     ergocalciferol (vitamin D2) 1,250 mcg (50,000 unit) Capsule  Commonly known as: DRISDOL   50,000 Units, Oral, EVERY 7 DAYS  Refills: 0     ezetimibe 10 mg Tablet  Commonly known as: ZETIA   10 mg, Oral, DAILY  Qty: 90 Tablet  Refills: 3     gabapentin 100 mg Capsule  Commonly known as: NEURONTIN  100 mg, Oral, 2 TIMES DAILY  Qty: 180 Capsule  Refills: 1     gemfibroziL 600 mg Tablet  Commonly known as: LOPID   600 mg, Oral, 2 TIMES DAILY BEFORE MEALS  Qty: 180 Tablet  Refills: 3     losartan 50 mg Tablet  Commonly known as: COZAAR   25 mg, Oral, 2 TIMES DAILY  Qty: 90 Tablet  Refills: 3     metoprolol tartrate 25 mg Tablet  Commonly known as: LOPRESSOR   25 mg, Oral, DAILY  Refills: 0     sodium bicarbonate 650 mg Tablet   650 mg, Oral, 2 TIMES DAILY  Qty: 180 Tablet  Refills: 3            ASK your doctor about these medications.        Details   metoprolol succinate 100 mg Tablet Sustained Release 24 hr  Commonly known as: TOPROL-XL  Ask about: Which instructions should I use?   100 mg, Oral, DAILY  Qty: 90 Tablet  Refills: 4            Discharge med list refreshed?  YES                     ALLERGIES:  Allergies   Allergen Reactions    Iodinated Contrast Media Anaphylaxis    Etodolac NO Steroids unless approved by Attending Physician    Iv Contrast      makese her serverly sick     Symbicort [Budesonide-Formoterol]     Codeine  Other Adverse Reaction (Add comment) and Nausea/ Vomiting     Stomach pain  Stomach pain               HOSPITAL PROCEDURE(S):   Bedside Procedures:  No orders of the defined types were placed in this encounter.    Surgical       REASON FOR HOSPITALIZATION AND HOSPITAL COURSE     BRIEF HPI:  This is a 87 y.o., female admitted for patient was admitted to the medical service after sustaining a fall.  X-rays did show a stable subcapital hip fracture and inferior and it and superior pelvic ramus fracture.  She had resultant pain and debility.    BRIEF HOSPITAL NARRATIVE:         Hospital course unremarkable.  She was seen by Dr. Owens Shark on consultation who recommend no surgical intervention.  Pain was managed and physical therapy was consulted.  She did have a UTI with Klebsiella and was placed on antibiotics.  Patient is stable but still have a marked debility and pain and will be transferred to swing bed for further physical therapy, medical management, pain management, and rehabilitation.  She shall be transferred in stable satisfactory condition.     TRANSITION/POST DISCHARGE CARE/PENDING TESTS/REFERRALS:  She will be transferred to swing bed in stable satisfactory condition.  Follow-up will be with the hospitalist.  Medications are per the discharge reconciliation.        CONDITION ON DISCHARGE:  A. Ambulation: Ambulation with assistive device  B. Self-care Ability: With partial assistance  C. Cognitive Status Alert  D. Code status at discharge:             LINES/DRAINS/WOUNDS AT DISCHARGE:   Patient Lines/Drains/Airways Status       Active Line / Dialysis Catheter / Dialysis Graft / Drain / Airway / Wound       Name Placement date Placement time Site Days  Peripheral IV Right Cephalic  (lateral side of arm) 09/25/21  1556  -- 2                    DISCHARGE DISPOSITION:  Skilled Nursing Unit              DISCHARGE INSTRUCTIONS:    No discharge procedures on file.              Rex Kras, DO    Copies sent to Care Team         Relationship Specialty Notifications Start End    [place], [place], [place] PCP - General INTERNAL MEDICINE Admissions 04/05/20     Phone: [phone] Fax: [phone]         [place] [id number]              Referring providers can utilize [email or web address] to access their referred [place] patient's information.

## 2021-09-28 NOTE — Nurses Notes (Signed)
Patient converted to swing bed for physical therapy and strengthening.

## 2021-09-28 NOTE — Care Plan (Addendum)
[place]  Physical Therapy Initial Evaluation     Patient Name: Theresa Greene  Date of Birth: 01/29/1935  Height: Height: 167.6 cm (5' 6" )  Weight: Weight: 49.1 kg (108 lb 3.2 oz)  Room/Bed: 701/B  Payor: MEDICARE / Plan: MEDICARE PART A AND B / Product Type: Medicare /      Assessment:       (P) 87 y.o. female s/p fall with subsequent R superior and inferior pubic rami fractures and nondisplaced subcapital femoral neck fracture previously fixated. Non operative treatment recommended with orders for WBAT on RLE. Pt transferred to swing bed program at [facility] for continued skilled rehabilitation services 09/28/21. 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 Pt demonstrates increased pain in RLE and decreased strength limiting her safety and independence with bed mobility, transfers and ambulation. Pt required MIN A to complete transfers with cues for set up and sequencing and MIN A to ambulate 24 ft with cues for sequencing wand walker management with decreased weightbearing tolerance on RLE with difficulty advancing LLE to complete step to gait pattern. Skilled PT services warranted to improve pt's RLE strength, to improve her independence with bed mobility and transfers, to improve her gait mechanics and independence with need to progress to ambulating  household distances and to progress pt to negotiating stairs in order to access her home at discharge. Pt lives with her husband in a one story home with 2 STE with one rail. Pt was independent with use of rollator prior to fall. Pt to discharge home with her husband home health once goals met. No DME needs currently noted with pt's husband reporting pt has FWW at home.     Discharge Needs:    Equipment Recommendation: (P) TBD                               The patient presents with mobility  limitations due to impaired balance, impaired strength, weight bearing restrictions, and impaired functional activity tolerance that significantly impair/prevent patient's ability to participate in mobility-related activities of daily living (MRADLs) including  ambulation and transfers in order to safely complete, toileting, bathing, food preparation, laundering/household tasks, safely entering/exiting the home, in reasonable time. This functional mobility deficit can be sufficiently resolved with the use of a (P) TBD  in order to decrease the risk of falls, morbidity, and mortality in performance of these MRADLs.  Patient is able to safely use this assistive device.     Discharge Disposition: (P) inpatient rehabilitation facility prior to discharge home with home health      JUSTIFICATION OF DISCHARGE RECOMMENDATION   Based on current diagnosis, functional performance prior to admission, and current functional performance, this patient requires continued PT services in (P) inpatient rehabilitation facility in order to achieve significant functional improvements in these deficit areas: (P) aerobic capacity/endurance, gait, locomotion, and balance, joint integrity and mobility, muscle performance, ventilation and respiration/gas exchange.     Plan:   Current Intervention: (P) balance training, bed mobility training, gait training, home exercise program, patient/family education, stair training, strengthening, transfer training  To provide physical therapy services (P) minimum of 5x/week  for duration of (P) until discharge.     The risks/benefits of therapy have been discussed with the patient/caregiver and he/she is in agreement with  the established plan of care.         Subjective & Objective           09/28/21 1415   Therapist Pager   PT Assigned/ Pager # [name] DPT [phone]   Rehab Session   Document Type evaluation   Total PT Minutes: 16   Patient Effort good   Symptoms Noted During/After Treatment increased  pain   Symptoms Noted Comment Pt reported increased pain in RLE from 4/10 to 7/10 with activity.    General Information   Patient Profile Reviewed yes   Onset of Illness/Injury or Date of Surgery 09/25/21   Referring Physician Dr. Graylon Good   Pertinent History of Current Functional Problem 87 y.o. female s/p fall with subsequent R superior and inferior pubic rami fractures and nondisplaced subcapital femoral neck fracture previously fixated. Non operative treatment recommended with orders for WBAT on RLE. Pt transferred to swing bed program at [facility] 09/28/21 for continued skilled rehabilitation services. 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    Medical Lines PIV Line   Respiratory Status Room air    Existing Precautions/Restrictions fall precautions   Weight-bearing Status   Extremity Weight-bearing Status right lower extremity   Right Lower Extremity weight-bearing as tolerated (WBAT)   Mutuality/Individual Preferences   Anxieties, Fears or Concerns None stated   Individualized Care Needs Nursing and physical therapy   Patient-Specific Goals (Include Timeframe) Home with husband and home health once goals met. Pt reports her daughter will also be assisting her at discharge.    Plan of Care Reviewed With patient   Living Environment   Lives With spouse   Living Arrangements house   Home Accessibility stairs to enter home   Financial Concerns none   Transportation Available family or friend will provide   Living Environment Comment Pt lives with husband in one story home with 2 STE with rail. Pt has walk in shower with seat. Pt's husband has cancer per chart.   Functional Level Prior   Ambulation 1 - assistive equipment   Transferring 1 - assistive equipment   Toileting 1 - assistive equipment   Bathing 1 - assistive equipment   Dressing 0 - independent   Eating 0 - independent    Communication 0 - understands/communicates without difficulty   Swallowing 0-->swallows foods/liquids without difficulty   Prior Functional Level Comment Pt was independent with ambulation utilizing a rollator prior to admission. Pt slept in a recliner prior to admission and plans to continue at discharge.    Pre Treatment Status   Pre Treatment Patient Status Pt up in chair;Telephone within reach;Call light within reach;Nurse approved session  (Alarm engaged)   Support Present Pre Treatment  None   Communication Pre Treatment  Nurse   Communication Pre Treatment Comment RN approved session   Cognitive Assessment/Interventions   Behavior/Mood Observations behavior appropriate to situation, WNL/WFL   Orientation Status oriented x 3   Attention WNL/WFL   Follows Commands follows two step commands   Pain Assessment   Pain Intervention  PRN Medication;Repositioned   Pretreatment Pain Rating 4/10   Posttreatment Pain Rating 4/10 with pain increased to 7/10 with activity    Pain Location - Side Right   Pain Location    Hip and groin   Pre/Posttreatment Pain Comment Pt medication coordinated with session    RUE Assessment  RUE Assessment WFL for stated baseline   LUE Assessment   LUE Assessment WFL for stated baseline   RLE Assessment   RLE Assessment    (2/5 wit pt limited by pain)   LLE Assessment   LLE Assessment    (4/5)   Bed Mobility Assessment/Treatment   Transfer Assessment/Treatment   Sit-Stand Independence minimum assist (75% patient effort);verbal cues required   Stand-Sit Independence minimum assist (75% patient effort);verbal cues required   Sit-Stand-Sit, Assist Device walker, front wheeled   Transfer Safety Issues balance decreased during turns;sequencing ability decreased;step length decreased;weight-shifting ability decreased   Transfer Impairments balance impaired;pain;strength decreased   Gait Assessment/Treatment   Total Distance Ambulated 24   Independence  minimum assist (75% patient effort);verbal  cues required   Assistive Device  walker, front wheeled   Deviations  cadence decreased;double stance time increased;step length decreased;toe-to-floor clearance decreased;weight-shifting ability decreased   Safety Issues  sequencing ability decreased;step length decreased;weight-shifting ability decreased   Impairments  balance impaired;endurance;pain;strength decreased   Comment Pt requires cues for sequencing wand walker management. Difficulty advancing LLE to complete step to gait pattern   Balance Skill Training   Sitting Balance: Static good balance   Sitting, Dynamic (Balance) fair + balance   Sit-to-Stand Balance poor + balance   Standing Balance: Static poor + balance   Standing Balance: Dynamic unable to balance   Systems Impairment Contributing to Balance Disturbance somatosensory;musculoskeletal   Identified Impairments Contributing to Balance Disturbance pain;impaired sensory feedback;decreased strength   Post Treatment Status   Post Treatment Patient Status Pt up in chair and positioned for comfort with call light, phone and hydration in reach. All needs met.   (Alarm engaged)   Support Present Post Treatment  None   Plan of Care Review   Plan Of Care Reviewed With patient   Physical Therapy Clinical Impression   Assessment  87 y.o. female s/p fall with subsequent R superior and inferior pubic rami fractures and nondisplaced subcapital femoral neck fracture previously fixated. Non operative treatment recommended with orders for WBAT on RLE. Pt transferred to swing bed program at [facility] for continued skilled rehabilitation services 09/28/21. 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 Pt demonstrates increased pain in RLE and decreased strength limiting her safety and independence with bed mobility, transfers and ambulation. Pt required MIN A >CGA to complete transfers with cues  for set up and sequencing and MIN A>CGA to ambulate 24 ft with cues for sequencing wand walker management with decreased weightbearing tolerance on RLE with difficulty advancing LLE to complete step to gait pattern. Skilled PT services warranted to improve pt's RLE strength, to improve her independence with bed mobility and transfers, to improve her gait mechanics and independence with need to progress to ambulating  household distances and to progress pt to negotiating stairs in order to access her home at discharge. Pt lives with her husband in a one story home with 2 STE with one rail. Pt was independent with use of rollator prior to fall. Pt to discharge home with her husband home health once goals met. No DME needs currently noted with pt's husband reporting pt has FWW at home.   Patient/Family Goals Statement Home with home health once goals met    Criteria for Skilled Therapeutic yes   Pathology/Pathophysiology Noted musculoskeletal   Impairments Found (describe specific impairments) aerobic capacity/endurance;gait, locomotion,  and balance;joint integrity and mobility;muscle performance   Functional Limitations in Following  self-care;home management;community/leisure   Rehab Potential good   Therapy Frequency minimum of 5x/week   Predicted Duration of Therapy Intervention (days/wks) until discharge   Anticipated Equipment Needs at Discharge (PT) TBD   Anticipated Discharge Disposition Home with home health once goals met    Evaluation Complexity Justification   Patient History: Co-morbidity/factors that impact Plan of Care 3 or more that impact Plan of Care   Examination Components 4 or more Exam elements addressed   Presentation Stable   Clinical Decision Making Low complexity   Evaluation Complexity Low complexity   Care Plan Goals   PT Rehab Goals Bed Mobility Goal;Gait Training Goal;Stairs Training Goal;Strength Goal;Transfer Training Goal   Bed Mobility Goal   Bed Mobility Goal, Date Established  09/28/21   Bed Mobility Goal, Time to Achieve by discharge   Bed Mobility Goal, Activity Type supine to sit/sit to supine   Bed Mobility Goal, Independence Level Minimum Assistance    Bed Mobility Goal, Assistive Device least restrictive assistive device   Gait Training  Goal, Distance to Achieve   Gait Training  Goal, Date Established 09/28/21   Gait Training  Goal, Time to Achieve by discharge   Gait Training  Goal, Independence Level stand-by assistance   Gait Training  Goal, Assist Device walker, rolling   Gait Training  Goal, Distance to Achieve 100   Strength Goal   Strength Goal, Date Established 09/28/21   Strength Goal, Time to Achieve by discharge   Strength Goal, Measure to Achieve 3+/5   Strength Goal, Functional Goal Pt will increase her RLE strength to 3+/5 in order to improve her transfer independence and gait stability with FWW.   Stairs Training Goal   Stairs Training Goal, Date Established 09/28/21   Stairs Training Goal, Time to Achieve by discharge   Stairs Training Goal, Independence Level contact guard assist   Stairs Training Goal, Assist Device handrail, left;handrail, right  (One rail)   Stairs Training Goal, Number of Stairs to Achieve 2   Transfer Training Goal   Transfer Training Goal, Date Established 09/28/21   Transfer Training Goal, Time to Achieve by discharge   Transfer Training Goal, Activity Type bed-to-chair/chair-to-bed;sit-to-stand/stand-to-sit   Transfer Training Goal, Independence Level modified independence   Transfer Training Goal, Assist Device walker, rolling   Planned Therapy Interventions, PT Eval   Planned Therapy Interventions (PT) balance training;bed mobility training;gait training;home exercise program;patient/family education;stair training;strengthening;transfer training   Colman Relationship/Rapport care explained;questions answered;thoughts/feelings acknowledged   Supervisory visit completed with PTA??. Reviewed POC including precautions,  interventions, goals, and discharge plan. PTA verbalized understanding and is able to carry out POC.       Therapist:   [name], PT [phone]

## 2021-09-28 NOTE — Telephone Encounter (Signed)
Regarding: Dr. Fransico Michael  ----- Message from Konrad Felix sent at 09/28/2021  9:07 AM EST -----  Dr. Fransico Michael  Patient's daughter called stating that they were under the understanding that Ethyle could have her pov at her local eye doctor but she is scheduled for 2/23 in [place], she is asking if she needs to keep that apt .  Please call her at [phone]

## 2021-09-28 NOTE — Progress Notes (Signed)
[facility]  Progress Note    Theresa Greene  Date of service: 09/28/2021  Date of Admission:  09/25/2021  Hospital Day:  LOS: 3 days     Subjective:  Patient is without complaint this morning.  Her pain is managed.  She denies nausea this morning.  She has been able ambulate short distances.    Review of Systems:    Review of Systems   Constitutional: Negative for chills, diaphoresis, fever, malaise/fatigue and weight loss.   HENT: Negative.    Eyes: Negative.    Respiratory: Negative for cough, hemoptysis, sputum production, shortness of breath and wheezing.    Cardiovascular: Negative for chest pain, palpitations, orthopnea, claudication, leg swelling and PND.   Gastrointestinal: Negative for abdominal pain, blood in stool, constipation, diarrhea, heartburn, melena, nausea and vomiting.   Genitourinary: Negative.  Negative for dysuria, frequency and urgency.   Musculoskeletal: Positive for falls, joint pain and myalgias. Negative for back pain and neck pain.   Skin: Negative.    Neurological: Negative for dizziness, tingling, tremors, sensory change, speech change, focal weakness, seizures, loss of consciousness, weakness and headaches.   Endo/Heme/Allergies: Negative.    Psychiatric/Behavioral: Negative.          Objective:     Vital Signs:  Vitals:    09/27/21 2000 09/28/21 0000 09/28/21 0400 09/28/21 0800   BP: 121/69 (!) 143/67 (!) 154/70 134/64   Pulse: 70 69 70 71   Resp: 18 18 18 18    Temp: 36.8 C (98.3 F) 36.8 C (98.3 F) 36.7 C (98.1 F) 36.9 C (98.4 F)   SpO2: 92% 93% 90% 93%   Weight:       Height:       BMI:                I/O:  I/O last 24 hours:      Intake/Output Summary (Last 24 hours) at 09/28/2021 1234  Last data filed at 09/28/2021 1200  Gross per 24 hour   Intake 1320 ml   Output --   Net 1320 ml         acetaminophen (TYLENOL) tablet, 650 mg, Oral, Q6H PRN  amiodarone (CORDARONE) tablet, 100 mg, Oral, Daily  amLODIPine (NORVASC) tablet, 5 mg, Oral, Daily  atorvastatin  (LIPITOR) tablet, 20 mg, Oral, Daily  clopidogrel (PLAVIX) 75 mg tablet, 75 mg, Oral, Daily  docusate sodium (COLACE) capsule, 100 mg, Oral, 2x/day  enoxaparin PF (LOVENOX) 30 mg/0.3 mL SubQ injection, 30 mg, Subcutaneous, Daily  ergocalciferol-vitamin D2 (DRISDOL) capsule, 50,000 Units, Oral, Q7 Days  ezetimibe (ZETIA) tablet, 10 mg, Oral, Daily  gabapentin (NEURONTIN) capsule, 100 mg, Oral, 2x/day  HYDROcodone-acetaminophen (NORCO) 5-325 mg per tablet, 1 Tablet, Oral, Q4H PRN  HYDROmorphone (DILAUDID) 0.5 mg/0.5 mL injection, 0.5 mg, Intravenous, Q3H PRN  losartan (COZAAR) tablet, 50 mg, Oral, Daily  metoprolol succinate (TOPROL-XL) 24 hr extended release tablet, 100 mg, Oral, Daily  NS flush syringe, 10 mL, Intracatheter, Q8HRS  NS flush syringe, 10 mL, Intracatheter, Q1H PRN  ondansetron (ZOFRAN) 2 mg/mL injection, 4 mg, Intravenous, Q4H PRN  pancreatic enzyme replacement (CREON) 36,000 units lipase per cap, 1 Capsule, Oral, 3x/day-Meals  pantoprazole (PROTONIX) delayed release tablet, 40 mg, Oral, Daily        Physical Exam:    Physical Exam  Vitals and nursing note reviewed.   Constitutional:       Appearance: She is ill-appearing.   HENT:      Head: Normocephalic and atraumatic.  Right Ear: Tympanic membrane normal.      Left Ear: Tympanic membrane normal.      Nose: Nose normal.      Mouth/Throat:      Mouth: Mucous membranes are moist.   Eyes:      Pupils: Pupils are equal, round, and reactive to light.   Neck:      Vascular: No carotid bruit.   Cardiovascular:      Rate and Rhythm: Normal rate and regular rhythm.      Pulses: Normal pulses.      Heart sounds: Normal heart sounds. No murmur heard.    No friction rub. No gallop.   Pulmonary:      Effort: Pulmonary effort is normal. No respiratory distress.      Breath sounds: Normal breath sounds. No stridor. No wheezing, rhonchi or rales.   Chest:      Chest wall: No tenderness.   Abdominal:      General: Bowel sounds are normal. There is no distension.       Palpations: Abdomen is soft. There is no mass.      Tenderness: There is no abdominal tenderness. There is no guarding or rebound.      Hernia: No hernia is present.   Musculoskeletal:         General: Tenderness and deformity present. Normal range of motion.      Cervical back: Normal range of motion and neck supple. No rigidity or tenderness.      Comments: Pelvic shows no obvious deformity or crepitus on examination.  She does have marked tenderness over her right hip region.  Noted minimal rotation.   Lymphadenopathy:      Cervical: No cervical adenopathy.   Skin:     General: Skin is warm and dry.      Capillary Refill: Capillary refill takes less than 2 seconds.   Neurological:      General: No focal deficit present.      Mental Status: She is alert and oriented to person, place, and time.      Cranial Nerves: No cranial nerve deficit.      Sensory: No sensory deficit.      Motor: Weakness present.      Coordination: Coordination normal.      Gait: Gait abnormal.      Deep Tendon Reflexes: Reflexes normal.   Psychiatric:         Mood and Affect: Mood normal.         Behavior: Behavior normal.         Thought Content: Thought content normal.         Judgment: Judgment normal.           Labs:  No results found for this or any previous visit (from the past 24 hour(s)).         Imaging:           Microbiology:  Hospital Encounter on 09/25/21 (from the past 96 hour(s))   URINE CULTURE,ROUTINE    Collection Time: 09/26/21  1:54 AM    Specimen: Urine, Site not specified   Culture Result Status    URINE CULTURE >100000 CFU/mL Klebsiella pneumoniae (A) Final       Susceptibility    Klebsiella pneumoniae -  (no method available)     Ampicillin 16 Resistant mcg/mL     Ampicillin/Sulbactam <=2 Sensitive mcg/mL     Cefazolin <=4 Sensitive mcg/mL     Cefepime <=0.12 Sensitive mcg/mL  Ceftazidime <=1 Sensitive mcg/mL     Ceftriaxone <=0.25 Sensitive mcg/mL     Ciprofloxacin <=0.25 Sensitive mcg/mL     Ertapenem  <=0.12 Sensitive mcg/mL     Gentamicin <=1 Sensitive mcg/mL     Imipenem <=0.25 Sensitive mcg/mL     Levofloxacin 0.5 Sensitive mcg/mL     Nitrofurantoin 64 Intermediate mcg/mL     Tobramycin <=1 Sensitive mcg/mL     Piperacillin/Tazobactam <=4 Sensitive mcg/mL     Trimethoprim/Sulfamethoxazole <=20 Sensitive mcg/mL    Narrative    Pure culture           Assessment/ Plan:     Active Hospital Problems    Diagnosis   . Primary Problem: Hip fracture (CMS HCC)   . Pelvic fracture (CMS HCC)   . Debility      Patient is hemodynamically stable.  Pain is managed.  He hemoglobin is down somewhat but reasonable.  Will plan to recheck in a few days.  Disposition Planning:   Swing bed  Rex Kras, DO    This note was partially generated using MModal Fluency Direct system, and there may be some incorrect words, spellings, and punctuation that were not noted in checking the note before saving

## 2021-09-28 NOTE — Telephone Encounter (Signed)
Regarding: Dr. Annamarie Dawley  ----- Message from [place] sent at 09/28/2021 10:32 AM EST -----  Pt daughter calling back to speak with Edd Fabian Please call back to advise, Thank you!!    ----- Message from Myrtha Mantis sent at 09/28/2021  9:07 AM EST -----  Dr. Annamarie Dawley  Patient's daughter called stating that they were under the understanding that Makiah could have her pov at her local eye doctor but she is scheduled for 2/23 in [place], she is asking if she needs to keep that apt .  Please call her at [phone]

## 2021-09-29 ENCOUNTER — Ambulatory Visit (INDEPENDENT_AMBULATORY_CARE_PROVIDER_SITE_OTHER): Payer: Self-pay | Admitting: Internal Medicine

## 2021-09-29 LAB — CBC
HCT: 29.2 % — ABNORMAL LOW (ref 34.8–46.0)
HGB: 9.7 g/dL — ABNORMAL LOW (ref 11.5–16.0)
MCH: 31.4 pg (ref 26.0–32.0)
MCHC: 33.2 g/dL (ref 31.0–35.5)
MCV: 94.5 fL (ref 78.0–100.0)
MPV: 10.8 fL (ref 8.7–12.5)
PLATELETS: 216 10*3/uL (ref 150–400)
RBC: 3.09 10*6/uL — ABNORMAL LOW (ref 3.85–5.22)
RDW-CV: 14.6 % (ref 11.5–15.5)
WBC: 13.5 10*3/uL — ABNORMAL HIGH (ref 3.7–11.0)

## 2021-09-29 MED ORDER — MELATONIN 3 MG TABLET
3.0000 mg | ORAL_TABLET | Freq: Every evening | ORAL | Status: DC | PRN
Start: 2021-09-29 — End: 2021-10-09
  Administered 2021-10-01: 0 mg via ORAL

## 2021-09-29 NOTE — Care Plan (Signed)
[facility]  Care Plan Note    Situation: debility     Intervention: nursing care, safety measures, PT    Response: care ongoing      Theresa Elliot, RN    Problem: Health Knowledge, Opportunity to Enhance (Adult,Obstetrics,Pediatric)  Goal: Knowledgeable about Health Subject/Topic  Description: Patient will demonstrate the desired outcomes by discharge/transition of care.  Outcome: Ongoing (see interventions/notes)     Problem: Adult Inpatient Plan of Care  Goal: Patient-Specific Goal (Individualized)  Recent Flowsheet Documentation  Taken 09/29/2021 0800 by Theresa Elliot, RN  Individualized Care Needs: nursing care  Anxieties, Fears or Concerns: none voiced  Goal: Absence of Hospital-Acquired Illness or Injury  Intervention: Identify and Manage Fall Risk  Recent Flowsheet Documentation  Taken 09/29/2021 0800 by Theresa Elliot, RN  Safety Promotion/Fall Prevention:   nonskid shoes/slippers when out of bed   safety round/check completed  Intervention: Prevent Skin Injury  Recent Flowsheet Documentation  Taken 09/29/2021 0800 by Theresa Elliot, RN  Body Position: positioned/repositioned independently  Skin Protection: adhesive use limited  Intervention: Prevent and Manage VTE (Venous Thromboembolism) Risk  Recent Flowsheet Documentation  Taken 09/29/2021 0800 by Theresa Elliot, RN  VTE Prevention/Management:   ambulation promoted   anticoagulant therapy maintained  Intervention: Prevent Infection  Recent Flowsheet Documentation  Taken 09/29/2021 0800 by Theresa Elliot, RN  Infection Prevention:   promote handwashing   rest/sleep promoted

## 2021-09-29 NOTE — Care Plan (Signed)
Patient screened this date for possible speech therapy services d/t admitting diagnosis and chart review.  Patient AAOx4 and reports no difficulty swallowing.  No speech therapy services warranted at this time.  No charge from speech therapy.     Coralyn Pear, [place], [place] [id number]  09/29/2021, 09:49

## 2021-09-29 NOTE — Care Plan (Signed)
1039: Patient reports being tired and requesting me to return later after lunch.

## 2021-09-29 NOTE — Progress Notes (Signed)
Nutrition review  87 year old female  Diagnosis and history reviewed  Full code status  IVs as ordered, medications reviewed- Lovenox Vit D2 and colace in use  Labs per orders  Seen by SLP- no therapy ordered  Cardia diet with supplement, intakes good, set up help, no food allergies  Weight 107 9.6# 5'6" tall underweight  IBW 130#+-10%  Calories 1717 35kcal/kg  Fluids 1717 4ml/kg  Protein 49 1.0gm/kg  Continue with plan

## 2021-09-29 NOTE — Pharmacy (Signed)
Pharmacy Medication Reconciliation    Patient Name: Theresa Greene, Theresa Greene  Date of Service: 09/29/2021  Date of Admission: 09/28/2021  Date of Birth: 1935/03/23  Length of Stay:   1 day   Service: Oleta MouseJAX SWING    Did pharmacist make suggestions for medication reconciliation? Yes    Medications REMOVED from home medication list:    Dicyclomine (GI specialist stopped med when Creon started  Pharmacist Recommendations:  Creon pharmacy directions are: 2 capsules TID with meals and 2 capsules twice daily with largest snacks.  Patient states she only takes 1 capsules TID + 1 capsule twice daily with snacks.  Juluis MireJacalyn Rogerio Boutelle, PHARMD      Information was collected from:  Pharmacy: [facility] [phone]  Patients Home Med list: Brought in by daughter    Clarified Prior to Admission Medications:  Prior to Admission medications    Medication Sig Taking Resumed Y/N (RPh) Comments   amiodarone (PACERONE) 200 mg Oral Tablet Take 0.5 Tablets (100 mg total) by mouth Once a day for 90 days Yes       amLODIPine (NORVASC) 5 mg Oral Tablet Take 1 Tablet (5 mg total) by mouth Once a day Yes       atorvastatin (LIPITOR) 20 mg Oral Tablet Take 1 Tablet (20 mg total) by mouth Once a day for 90 days Yes    *Pharmacy directions state 1 tablet every day.    *Home med list shows that she takes it 2 times a day.   clopidogreL (PLAVIX) 75 mg Oral Tablet Take 1 Tablet (75 mg total) by mouth Once a day for 90 days Yes       CREON 36,000-114,000- 180,000 unit Oral Capsule, Delayed Release(E.C.) 1 Capsule Three times daily with meals Yes    *Pharmacy directions state: Take 2 capsules by mouth three times a day with meals and twice with largest snacks of the day. (Five times total)    Patients husband states she did not tolerate it well and only takes 1 capsule by mouth 3 times a day with meals.    *Patients home med list states: take 1 capsule 1 time a day.   Dexlansoprazole (DEXILANT) 60 mg Oral Cap, Delayed Rel., Multiphasic Take 1 Capsule (60 mg total)  by mouth Once a day Yes       dicyclomine HCl (BENTYL ORAL) Take 60 mg by mouth Once a day      * Saw a note on the Home Medications tab listed below.    patient requested removal because the GI Dr. Juliette Alcideiscontinued after prescribing Creon.   ergocalciferol, vitamin D2, (DRISDOL) 1,250 mcg (50,000 unit) Oral Capsule Take 1 Capsule (50,000 Units total) by mouth Every 7 days Takes on Fridays     * Patients home med list shows Vit D-3 50,000 units is being given once weekly.   ezetimibe (ZETIA) 10 mg Oral Tablet Take 1 Tablet (10 mg total) by mouth Once a day for 90 days Yes       gabapentin (NEURONTIN) 100 mg Oral Capsule Take 1 Capsule (100 mg total) by mouth Twice daily Yes       gemfibroziL (LOPID) 600 mg Oral Tablet Take 1 Tablet (600 mg total) by mouth Twice a day before meals for 90 days Yes       losartan (COZAAR) 50 mg Oral Tablet Take 0.5 Tablets (25 mg total) by mouth Twice daily for 90 days Yes       metoprolol succinate (TOPROL-XL) 100 mg Oral Tablet  Sustained Release 24 hr Take 1 Tablet (100 mg total) by mouth Once a day Yes    Pharmacy Has Toprol-XL 50mg  listed.   metoprolol tartrate (LOPRESSOR) 25 mg Oral Tablet Take 1 Tablet (25 mg total) by mouth Once a day Yes           Did patient's home medication list require updates? Yes    Medications UPDATED on Prior to Admission Med List:  amLODIPine (NORVASC) 5 mg Oral Tablet,atorvastatin (LIPITOR) 20 mg Oral Tablet,clopidogreL (PLAVIX) 75 mg Oral Tablet,CREON 36,000-114,000- 180,000 unit Oral Capsule, Delayed Release(E.C.),Dexlansoprazole (DEXILANT) 60 mg Oral Cap, Delayed Rel., Multiphasic,ezetimibe (ZETIA) 10 mg Oral Tablet,gabapentin (NEURONTIN) 100 mg Oral Capsule,gemfibroziL (LOPID) 600 mg Oral Tablet,losartan (COZAAR) 50 mg Oral Tablet,metoprolol succinate (TOPROL-XL) 100 mg Oral Tablet Sustained Release 24 hr,metoprolol tartrate (LOPRESSOR) 25 mg Oral Tablet    Medications ADDED to Prior to Admission Med List:  NONE    Allergies:    Allergies   Allergen  Reactions    Iodinated Contrast Media Anaphylaxis    Iv Contrast Anaphylaxis     makese her serverly sick    Etodolac NO Steroids unless approved by Attending Physician    Symbicort [Budesonide-Formoterol]     Codeine  Other Adverse Reaction (Add comment) and Nausea/ Vomiting     Stomach pain  Stomach pain         Tidd Cora, Pharmacologist

## 2021-09-29 NOTE — Care Plan (Signed)
[place]  Physical Therapy Progress Note    Patient Name: Theresa Greene  Date of Birth: [birth year]  Height:  167.6 cm (5' 6" )  Weight:  48.8 kg (107 lb 9.6 oz)    Room/Bed: 701/B  Payor: Payor: MEDICARE / Plan: MEDICARE PART A AND B / Product Type: Medicare /     Referring physician: Rex Kras, DO  Pertinent History of Current Functional Problem: 87 y.o. female s/p fall with subsequent R superior and inferior pubic rami fractures and nondisplaced subcapital femoral neck fracture previously fixated. Non operative treatment recommended with orders for WBAT on RLE. Pt transferred to swingbed program at [facility] for continued skilled rehabilitation services 09/28/21. 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    Start Time: 1436  End Time: 1503  Total Treatment Time: 27 minutes    Pt Start Position/alarms: reclined in bed  Pt End Position/alarms:  Seated in chair        Assessment:       Patient requires assist with RLE. Added ice pack to hip at conclusion d/t hip adduction and abduction and marching increased some pain.     Discharge Needs:   Equipment Recommendation: TBD    Discharge Disposition: To be determined    Plan:   Continue to follow patient according to established plan of care.  The risks/benefits of therapy have been discussed with the patient/caregiver and he/she is in agreement with the established plan of care.     Subjective & Objective:     Patient agreeable to therapy. Gets to EOB with assist with right LE. Stands supervision and ambulates 120 feet at [facility] and wants to go to her chair. Sits and performs seated exercises per log. Left with needs met.     Pain: not rated    Bed Mobility: Contact Guard  Supine to Sit: Contact Guard    Sit to Stand: Stand-by Assist  Stand to Sit: Stand-by Assist    Gait Training  Ambulation Device:  Conservation officer, nature  Ambulation Assist: Stand-by Assist  Ambulation Distance: 120 feet      Lower Extremity Exercises:      Heel/Toe Raises: 20x  LAQ: 2x10  SHF: 20x each marching alternating  Hip Abduction: 20x manual resistance  Hip Adduction: 20x pillow        Physical Therapy Goals:   Bed Mobility Goal, Activity Type: supine to sit/sit to supine  Bed Mobility Goal, Independence Level: minimum assist (75% patient effort)   Bed Mobility Goal, Assistive Device: least restrictive assistive device    Transfer Training Goal, Activity Type: bed-to-chair/chair-to-bed, sit-to-stand/stand-to-sit  Transfer Training Goal, Independence Level: modified independence   Transfer Training Goal, Assist Device: walker, rolling    Gait Training  Goal, Independence Level: stand-by assistance   Gait Training  Goal, Assist Device: walker, rolling   Gait Training  Goal, Distance to Achieve: 100    Stairs Training Goal, Independence Level: contact guard assist   Stairs Training Goal, Assist Device: handrail, left, handrail, right (One rail)   Stairs Training Goal, Number of Stairs to Achieve: 2      Therapist:   Charlyne Mom, PTA

## 2021-09-30 NOTE — Care Plan (Signed)
[facility]  Care Plan Note    Situation: debility     Intervention: nursing care, safety measures, PT    Response: care ongoing      Crista Elliot, RN    Problem: Health Knowledge, Opportunity to Enhance (Adult,Obstetrics,Pediatric)  Goal: Knowledgeable about Health Subject/Topic  Description: Patient will demonstrate the desired outcomes by discharge/transition of care.  Outcome: Ongoing (see interventions/notes)     Problem: Adult Inpatient Plan of Care  Goal: Patient-Specific Goal (Individualized)  Recent Flowsheet Documentation  Taken 09/30/2021 0800 by Crista Elliot, RN  Individualized Care Needs: nursing care  Anxieties, Fears or Concerns: none voiced  Goal: Absence of Hospital-Acquired Illness or Injury  Intervention: Identify and Manage Fall Risk  Recent Flowsheet Documentation  Taken 09/30/2021 0800 by Crista Elliot, RN  Safety Promotion/Fall Prevention:   safety round/check completed   nonskid shoes/slippers when out of bed  Intervention: Prevent Skin Injury  Recent Flowsheet Documentation  Taken 09/30/2021 0800 by Crista Elliot, RN  Body Position: positioned/repositioned independently  Skin Protection: adhesive use limited  Intervention: Prevent and Manage VTE (Venous Thromboembolism) Risk  Recent Flowsheet Documentation  Taken 09/30/2021 0800 by Crista Elliot, RN  VTE Prevention/Management:   ambulation promoted   anticoagulant therapy maintained  Intervention: Prevent Infection  Recent Flowsheet Documentation  Taken 09/30/2021 0800 by Crista Elliot, RN  Infection Prevention:   promote handwashing   rest/sleep promoted

## 2021-09-30 NOTE — Care Plan (Signed)
[place]  Physical Therapy Progress Note    Patient Name: Theresa Greene  Date of Birth: [birth year]  Height:  167.6 cm (5' 6" )  Weight:  48.8 kg (107 lb 9.6 oz)    Room/Bed: 701/B  Payor: Payor: MEDICARE / Plan: MEDICARE PART A AND B / Product Type: Medicare /     Referring physician: Rex Kras, DO  Pertinent History of Current Functional Problem: 87 y.o. female s/p fall with subsequent R superior and inferior pubic rami fractures and nondisplaced subcapital femoral neck fracture previously fixated. Non operative treatment recommended with orders for WBAT on RLE. Pt transferred to swingbed program at [facility] for continued skilled rehabilitation services 09/28/21. 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    Start Time: 1050  End Time: 1116  Total Treatment Time: 26 minutes    Pt Start Position/alarms: White female supine in bed on room air in no distress, pleasant.   Pt End Position/alarms: Patient left seated in reclined bedside chair on room air with no complaints. All needs met, call light and phone in reach. TABs alarm attached.     Assessment:         Patient tolerated limited PT session well this date including transfers and short ambulation distances. Patient self limited this date due to more pain yesterday after long ambulation distance. Patient requested ambulating shorter distances this date. WW provided, and adjusted to fit. Transfers MinA. Gait training 15, 18, and 22 feet SBA-CGA with [employer]. Kermit Balo WW fluidity noted. Further PT services indicated to progress functional independence during transfers from low surfaces, gait mechanics, and activity tolerance.     Discharge Needs:   Equipment Recommendation: TBD    Discharge Disposition: To be determined    Plan:   Continue to follow patient according to established plan of care.  The  risks/benefits of therapy have been discussed with the patient/caregiver and he/she is in agreement with the established plan of care.     Subjective & Objective:     RN Wendelyn Breslow approved PT session. Patient requested pain medication at start of PT session. RN Wendelyn Breslow notified and pain medication administered. ID by name and bracelet. Pleasant and eager to participate in PT session. Patient self limited this date due to more pain yesterday after long ambulation distance. Patient requested ambulating shorter distances this date.    Pain: Patient reports pain in B groin areas at start of session, 8-10/10 prior to medication. Patient stated pain is "controlled" soon after receiving pain medicine.     Bed Mobility: MiNA  Supine to Sit: MinA  Sit to Stand: CGA-MinA  Stand to Sit: CGA-MinA  Toilet transfer with MinA. Verbal cues for proper mechanics due to low surface. Patient performed static standing at sink while reaching forward and lateral side to side.     Gait Training  Ambulation Device: Conservation officer, nature  Ambulation Assist: Stand-by Assist, Contact Guard  Ambulation Distance: Gait training 15, 18, and 22 feet SBA with [employer].   Slow cadence, narrow base of support, short step lengths noted.       Physical Therapy Goals:   Bed Mobility Goal, Activity Type: supine to sit/sit to supine  Bed Mobility Goal, Independence Level: minimum assist (75% patient effort)   Bed Mobility Goal, Assistive Device: least restrictive assistive device    Transfer Training Goal,  Activity Type: bed-to-chair/chair-to-bed, sit-to-stand/stand-to-sit  Transfer Training Goal, Independence Level: modified independence   Transfer Training Goal, Assist Device: walker, rolling    Gait Training  Goal, Independence Level: stand-by assistance   Gait Training  Goal, Assist Device: walker, rolling   Gait Training  Goal, Distance to Achieve: 100    Stairs Training Goal, Independence Level: contact guard assist   Stairs Training Goal, Assist Device: handrail, left,  handrail, right (One rail)   Stairs Training Goal, Number of Stairs to Achieve: 2      Therapist:   Louellen Molder, PT, DPT, [phone]

## 2021-10-01 LAB — CBC WITH DIFF
BASOPHIL #: <0.1 10*3/uL (ref <=0.20)
BASOPHIL %: 0 %
EOSINOPHIL #: 0.35 10*3/uL (ref <=0.50)
EOSINOPHIL %: 4 %
HCT: 30.5 % — ABNORMAL LOW (ref 34.8–46.0)
HGB: 9.9 g/dL — ABNORMAL LOW (ref 11.5–16.0)
IMMATURE GRANULOCYTE #: 0.15 10*3/uL — ABNORMAL HIGH (ref <0.10)
IMMATURE GRANULOCYTE %: 2 % — ABNORMAL HIGH (ref 0–1)
LYMPHOCYTE #: 2.32 10*3/uL (ref 1.00–4.80)
LYMPHOCYTE %: 24 %
MCH: 30.7 pg (ref 26.0–32.0)
MCHC: 32.5 g/dL (ref 31.0–35.5)
MCV: 94.7 fL (ref 78.0–100.0)
MONOCYTE #: 1.29 10*3/uL — ABNORMAL HIGH (ref 0.20–1.10)
MONOCYTE %: 14 %
MPV: 10.3 fL (ref 8.7–12.5)
NEUTROPHIL #: 5.34 10*3/uL (ref 1.50–7.70)
NEUTROPHIL %: 56 %
PLATELETS: 301 10*3/uL (ref 150–400)
RBC: 3.22 10*6/uL — ABNORMAL LOW (ref 3.85–5.22)
RDW-CV: 14.7 % (ref 11.5–15.5)
WBC: 9.5 10*3/uL (ref 3.7–11.0)

## 2021-10-01 LAB — COMPREHENSIVE METABOLIC PNL, FASTING
ALBUMIN: 2.5 g/dL — ABNORMAL LOW (ref 3.4–4.8)
ALKALINE PHOSPHATASE: 69 U/L (ref 55–145)
ALT (SGPT): 46 U/L — ABNORMAL HIGH (ref 8–22)
ANION GAP: 8 mmol/L (ref 4–13)
AST (SGOT): 66 U/L — ABNORMAL HIGH (ref 8–45)
BILIRUBIN TOTAL: 0.5 mg/dL (ref 0.3–1.3)
BUN/CREA RATIO: 38 — ABNORMAL HIGH (ref 6–22)
BUN: 40 mg/dL — ABNORMAL HIGH (ref 8–25)
CALCIUM: 8.5 mg/dL — ABNORMAL LOW (ref 8.8–10.2)
CHLORIDE: 107 mmol/L (ref 96–111)
CO2 TOTAL: 25 mmol/L (ref 23–31)
CREATININE: 1.05 mg/dL (ref 0.60–1.05)
ESTIMATED GFR: 52 mL/min/BSA — ABNORMAL LOW (ref >=60)
GLUCOSE: 100 mg/dL — ABNORMAL HIGH (ref 70–99)
POTASSIUM: 4.5 mmol/L (ref 3.5–5.1)
PROTEIN TOTAL: 5.7 g/dL — ABNORMAL LOW (ref 6.0–8.0)
SODIUM: 140 mmol/L (ref 136–145)

## 2021-10-01 LAB — MAGNESIUM: MAGNESIUM: 2.4 mg/dL (ref 1.8–2.6)

## 2021-10-01 NOTE — Care Plan (Signed)
[facility]  Care Plan Note    Situation: debility     Intervention: nursing care, safety measures, PT, pain management    Response: care ongoing      Crista Elliot, RN    Problem: Health Knowledge, Opportunity to Enhance (Adult,Obstetrics,Pediatric)  Goal: Knowledgeable about Health Subject/Topic  Description: Patient will demonstrate the desired outcomes by discharge/transition of care.  Outcome: Ongoing (see interventions/notes)     Problem: Adult Inpatient Plan of Care  Goal: Patient-Specific Goal (Individualized)  Recent Flowsheet Documentation  Taken 10/01/2021 0800 by Crista Elliot, RN  Individualized Care Needs: nursing care  Anxieties, Fears or Concerns: none voiced  Goal: Absence of Hospital-Acquired Illness or Injury  Intervention: Identify and Manage Fall Risk  Recent Flowsheet Documentation  Taken 10/01/2021 0800 by Crista Elliot, RN  Safety Promotion/Fall Prevention:   nonskid shoes/slippers when out of bed   safety round/check completed  Intervention: Prevent Skin Injury  Recent Flowsheet Documentation  Taken 10/01/2021 0800 by Crista Elliot, RN  Body Position: positioned/repositioned independently  Skin Protection: adhesive use limited  Intervention: Prevent and Manage VTE (Venous Thromboembolism) Risk  Recent Flowsheet Documentation  Taken 10/01/2021 0800 by Crista Elliot, RN  VTE Prevention/Management:   ambulation promoted   anticoagulant therapy maintained  Intervention: Prevent Infection  Recent Flowsheet Documentation  Taken 10/01/2021 0800 by Crista Elliot, RN  Infection Prevention:   promote handwashing   rest/sleep promoted

## 2021-10-02 MED ORDER — LIPASE-PROTEASE-AMYLASE 36,000-114,000-180,000 UNIT CAPSULE,DELAY REL
1.0000 | DELAYED_RELEASE_CAPSULE | Freq: Two times a day (BID) | ORAL | Status: DC
Start: 2021-10-02 — End: 2021-10-09
  Administered 2021-10-02 – 2021-10-09 (×16): 1 via ORAL

## 2021-10-02 NOTE — Care Plan (Signed)
[place]  Physical Therapy Progress Note    Patient Name: Theresa Greene  Date of Birth: April 06, 1935  Height:  167.6 cm (5' 6" )  Weight:  48.8 kg (107 lb 9.6 oz)    Room/Bed: 701/B  Payor: Payor: MEDICARE / Plan: MEDICARE PART A AND B / Product Type: Medicare /     Referring physician: Rex Kras, DO  Pertinent History of Current Functional Problem: 87 y.o. female s/p fall with subsequent R superior and inferior pubic rami fractures and nondisplaced subcapital femoral neck fracture previously fixated. Non operative treatment recommended with orders for WBAT on RLE. Pt transferred to swingbed program at [facility] for continued skilled rehabilitation services 09/28/21. 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    Start Time: 1018  End Time: 1034  Total Treatment Time: 16 minutes    Pt Start Position/alarms: seated in bed side chair with chair reclined and LE elevated   Pt End Position/alarms:  seated in bed side chair with chair reclined and LE elevated, call light in reach, all needs met, communication with nursing post         Assessment:       Today's session focused on improving functional strength of the bilateral LE to improve pt's ability to perform transfers, ambulate, and perform ADLs safely and with more independence. Pt was able to perform all exercises without significant difficulty except R hip abduction with manual resistance due to reports of groin pain and discontinued after first attempt. Pt does report groin pain during hip adduction with pillow, but is able to perform the activity correctly. Pt demonstrates less visible ROM with R hip flexion compared to L hip flexion during seated hip flexion/marches. No signs of physical distress noted during session or significant changes in status at end of session compared to beginning of  today's session. All pt's needs met at end of session, communicated with nursing on pain medication request at end of session.     Discharge Needs:   Equipment Recommendation: TBD    Discharge Disposition: To be determined    Plan:   Continue to follow patient according to established plan of care.  The risks/benefits of therapy have been discussed with the patient/caregiver and he/she is in agreement with the established plan of care.     Subjective & Objective:     Pt states that she is having back pain today. Pt states that she has requested pain medication. Pt does not vocalize any other symptoms. Pt is agreeable to treatment session. Pt reports improvement in back pain at end of session.   Pt identified by name, wrist ID band, and DOB. Therapeutic exercise to improve functional strength in the bilateral LE to improve pt's ability to perform transfers, ADLs, and ambulate safely and with more independence.         Lower Extremity Exercises:  Ankle Pumps (reclined in chair): x30   Quad Sets (reclined in chair): 2x10 each LE   Hip Abduction with manual resistance:  L: x10 R: attempted (discontinued due to reports of pain)   Hip Adduction with pillow: x20   SHF: x5 (alternating) and x20 (alternating)     Pt education on using call light if she needs assistance              Physical Therapy Goals:  Bed Mobility Goal, Activity Type: supine to sit/sit to supine  Bed Mobility Goal, Independence Level: minimum assist (75% patient effort)   Bed Mobility Goal, Assistive Device: least restrictive assistive device    Transfer Training Goal, Activity Type: bed-to-chair/chair-to-bed, sit-to-stand/stand-to-sit  Transfer Training Goal, Independence Level: modified independence   Transfer Training Goal, Assist Device: walker, rolling    Gait Training  Goal, Independence Level: stand-by assistance   Gait Training  Goal, Assist Device: walker, rolling   Gait Training  Goal, Distance to Achieve: 100    Stairs Training Goal,  Independence Level: contact guard assist   Stairs Training Goal, Assist Device: handrail, left, handrail, right (One rail)   Stairs Training Goal, Number of Stairs to Achieve: 2      Therapist:   Charolett Bumpers, DPT [phone]

## 2021-10-02 NOTE — Care Plan (Signed)
[place]  Physical Therapy Progress Note    Patient Name: Theresa Greene  Date of Birth: [birth year]  Height:  167.6 cm (5' 6" )  Weight:  48.8 kg (107 lb 9.6 oz)    Room/Bed: 701/B  Payor: Payor: MEDICARE / Plan: MEDICARE PART A AND B / Product Type: Medicare /     Referring physician: Rex Kras, DO  Pertinent History of Current Functional Problem: 87 y.o. female s/p fall with subsequent R superior and inferior pubic rami fractures and nondisplaced subcapital femoral neck fracture previously fixated. Non operative treatment recommended with orders for WBAT on RLE. Pt transferred to swingbed program at [facility] for continued skilled rehabilitation services 09/28/21. 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    Start Time: 0835   End Time: 0904  Total Treatment Time: 29 minutes    Pt Start Position/alarms: White female supine in bed on room air in no distress, pleasant, no alarms.   Pt End Position/alarms: Patient left seated in reclined bedside chair with all needs met, call light and phone in reach, tray table at side.     Assessment:         Patient tolerated PT session well this AM including transfers and gait training. Transfers from bed, bedside chair, and BSC SBA-CGA. Patient opted to use bedside commode rather than using toilet due to toilet being low surface, patient noted difficulty with transfers from low surfaces. Plan to address low surface transfers as POC progresses     Discharge Needs:   Equipment Recommendation: TBD    Discharge Disposition: To be determined    Plan:   Continue to follow patient according to established plan of care.  The risks/benefits of therapy have been discussed with the patient/caregiver and he/she is in agreement with the established plan of care.     Subjective & Objective:     RN Vickie approved PT  session. Patient properly ID by name and bracelet in room 701B. Eager and motivated to participate in PT session.    Pain: No complaints of pain.     Bed Mobility: Stand-by Assist  Supine to Sit: Stand-by Assist, Contact Guard  Sit to Supine: Stand-by Assist, Contact Guard  Sit to Stand: Stand-by Assist, Contact Guard  Stand to Sit: Stand-by Assist, Contact Guard   Toilet transfer BSC with handrails SBA. Independent pericare.     Gait Training  Ambulation Device: Conservation officer, nature  Ambulation Assist: Stand-by Assist, Contact Guard  Ambulation Distance: 110 feet and 8 feet  Patient ambulated 110 feet and 8 feet SBA-CGA with WW on level surface with 90-180 degree direction changes. Slow cadence and verbal cues for forward eye gaze required. LLE exaggerated toe out noted. Increased time required for gait due to slow cadence.       Physical Therapy Goals:   Bed Mobility Goal, Activity Type: supine to sit/sit to supine  Bed Mobility Goal, Independence Level: minimum assist (75% patient effort)   Bed Mobility Goal, Assistive Device: least restrictive assistive device    Transfer Training Goal, Activity Type: bed-to-chair/chair-to-bed, sit-to-stand/stand-to-sit  Transfer Training Goal, Independence Level: modified independence   Transfer Training Goal, Assist Device: walker, rolling    Gait Training  Goal, Independence Level: stand-by assistance   Gait Training  Goal, Assist Device: walker, rolling   Gait Training  Goal, Distance to  Achieve: 100    Stairs Training Goal, Independence Level: contact guard assist   Stairs Training Goal, Assist Device: handrail, left, handrail, right (One rail)   Stairs Training Goal, Number of Stairs to Achieve: 2      Therapist:   Louellen Molder, PT, DPT, [phone]

## 2021-10-02 NOTE — Care Plan (Signed)
[facility]  Physical Therapy Progress Note    Patient Name: Theresa Greene  Date of Birth: 11/19/1934  Height:  167.6 cm (5' 6" )  Weight:  48.8 kg (107 lb 9.6 oz)    Room/Bed: 701/B  Payor: Payor: MEDICARE / Plan: MEDICARE PART A AND B / Product Type: Medicare /     Referring physician: Rex Kras, DO  Pertinent History of Current Functional Problem: 87 y.o. female s/p fall with subsequent R superior and inferior pubic rami fractures and nondisplaced subcapital femoral neck fracture previously fixated. Non operative treatment recommended with orders for WBAT on RLE. Pt transferred to swingbed program at [facility] for continued skilled rehabilitation services 09/28/21. 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    Start Time:1059  End Time: 1106  Total Treatment Time: 7 minutes    Pt Start Position/alarms: White female seated in reclined bedside chair on room air in no distress, no alarms.   Pt End Position/alarms: Patient left supine in bed with all needs met, call light and phone in reach, tray table at side.     Assessment:         Patient tolerated transfers well this session. Limited to transfers and limited gait distance due to R lower back pain. Requested to return to supine position for pain relief. Transfers SBA-MinA. Ambulated 6 feet MinA with hand held assist. Further PT services indicated to progress ambulation with WW, transfer independence, and consistent activity tolerance. Plan to attempt stair negotiation next session pending patient tolerance to simulate home environment.       Discharge Needs:   Equipment Recommendation: TBD    Discharge Disposition: To be determined    Plan:   Continue to follow patient according to established plan of care.  The risks/benefits of therapy have been discussed with the patient/caregiver and he/she  is in agreement with the established plan of care.     Subjective & Objective:     RN Vickie approved PT session. ID by name and bracelet in room 701B.     Pain: R sided low back pain, chronic, rated at 8/10 at this time lessened with supine position at end of session.    Bed Mobility: Stand-by Assist   Sit to Supine: Min Assist  Sit to Stand: Contact Guard  Stand to Sit: Advertising account planner Assist: Min Assist, hand held assist  Ambulation Distance: 6 feet   Patient ambulated 6 feet from bed to chair with MinA and hand held assist per patient request. R sided low back pain limited tolerance to mobility this session.      Physical Therapy Goals:   Bed Mobility Goal, Activity Type: supine to sit/sit to supine  Bed Mobility Goal, Independence Level: minimum assist (75% patient effort)   Bed Mobility Goal, Assistive Device: least restrictive assistive device    Transfer Training Goal, Activity Type: bed-to-chair/chair-to-bed, sit-to-stand/stand-to-sit  Transfer Training Goal, Independence Level: modified independence   Transfer Training Goal, Assist Device: walker, rolling    Gait Training  Goal, Independence Level: stand-by assistance   Gait Training  Goal, Assist Device: walker, rolling   Gait Training  Goal, Distance to Achieve: 100    Stairs Training Goal, Independence Level: contact guard assist   Stairs Training Goal, Assist Device: handrail, left, handrail, right (One rail)  Stairs Training Goal, Number of Stairs to Achieve: 2      Therapist:   Louellen Molder, [place], DPT, [phone]

## 2021-10-02 NOTE — Care Plan (Addendum)
[facility]  Physical Therapy Progress Note    Patient Name: Theresa Greene  Date of Birth: Sep 21, 1934  Height:  167.6 cm (_0 )  Weight:  48.8 kg (107 lb 9.6 oz)    Room/Bed: 701/B  Payor: Payor: MEDICARE / Plan: MEDICARE PART A AND B / Product Type: Medicare /     Referring physician: Rex Kras, DO  Pertinent History of Current Functional Problem: 87 y.o. female s/p fall with subsequent R superior and inferior pubic rami fractures and nondisplaced subcapital femoral neck fracture previously fixated. Non operative treatment recommended with orders for WBAT on RLE. Pt transferred to swingbed program at [facility] for continued skilled rehabilitation services 09/28/21. 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    Start Time: 1630  End Time: 1640  Total Treatment Time: 10 minutes    Pt Start Position/alarms: Supine in bed, call light in reach.   Pt End Position/alarms:  Supine in bed, call light in reach and pt husband present.         Assessment:       Pt gaining more mobility, now able to get from supine to sit without assist of R LE. She then completed gait in hallway. Had CNA speak with RN on when pt can have pain pill again. Pt not due for one per RN. Pt completed gait with longer distance to nurses station and back to room without rest break. She did need assist with R LE into bed. Upon exit all needs met and call light in reach. Pt would benefit from skilled care to further increase gait distance, stamina, and work on stair training.       Discharge Needs:   Equipment Recommendation: TBD    Discharge Disposition: To be determined    Plan:   Continue to follow patient according to established plan of care.  The risks/benefits of therapy have been discussed with the patient/caregiver and he/she is in agreement with the established plan of care.      Subjective & Objective:     Pt reports having back pain at this time and wonders when she can get pain pill. She did agree to gait.     Bed Mobility: Modified Independent  Supine to Sit: Independent  Sit to Supine: Min Assist  Sit to Stand: Stand-by Assist  Stand to Sit: Stand-by Assist    Gait Training  Ambulation Device: Rolling Walker  Ambulation Assist: Stand-by Assist  Ambulation Distance: 36f         Physical Therapy Goals:   Bed Mobility Goal, Activity Type: supine to sit/sit to supine  Bed Mobility Goal, Independence Level: minimum assist (75% patient effort)   Bed Mobility Goal, Assistive Device: least restrictive assistive device    Transfer Training Goal, Activity Type: bed-to-chair/chair-to-bed, sit-to-stand/stand-to-sit  Transfer Training Goal, Independence Level: modified independence   Transfer Training Goal, Assist Device: walker, rolling    Gait Training  Goal, Independence Level: stand-by assistance   Gait Training  Goal, Assist Device: walker, rolling   Gait Training  Goal, Distance to Achieve: 100    Stairs Training Goal, Independence Level: contact guard assist   Stairs Training Goal, Assist Device: handrail, left, handrail, right (One rail)   Stairs Training Goal, Number of Stairs to Achieve: 2      Therapist:   JLuiz Blare  PTA

## 2021-10-02 NOTE — Progress Notes (Signed)
[facility]  Progress Note    Theresa Greene  Date of service: 10/02/2021  Date of Admission:  09/28/2021  Hospital Day:  LOS: 4 days     Subjective:  Hip pain is much improved ambulating with only moderate discomfort at this time    Review of Systems:    Review of Systems   Constitutional: Negative for chills and fever.   Respiratory: Negative for shortness of breath.    Gastrointestinal: Negative for abdominal pain.         Objective:      Vital Signs:  Vitals:    10/01/21 0857 10/01/21 2140 10/02/21 0619 10/02/21 0800   BP:    (!) 170/78   Pulse:    70   Resp: 18 18 18 18    Temp:    36.5 C (97.7 F)   SpO2:    94%   Weight:       Height:       BMI:                I/O:  I/O last 24 hours:      Intake/Output Summary (Last 24 hours) at 10/02/2021 0925  Last data filed at 10/02/2021 0600  Gross per 24 hour   Intake 1180 ml   Output --   Net 1180 ml         acetaminophen (TYLENOL) tablet, 650 mg, Oral, Q6H PRN  amiodarone (CORDARONE) tablet, 100 mg, Oral, Daily  amLODIPine (NORVASC) tablet, 5 mg, Oral, Daily  atorvastatin (LIPITOR) tablet, 20 mg, Oral, Daily  cephalexin (KEFLEX) capsule, 500 mg, Oral, 2x/day  clopidogrel (PLAVIX) 75 mg tablet, 75 mg, Oral, Daily  docusate sodium (COLACE) capsule, 100 mg, Oral, 2x/day  enoxaparin PF (LOVENOX) 30 mg/0.3 mL SubQ injection, 30 mg, Subcutaneous, Daily  ergocalciferol-vitamin D2 (DRISDOL) capsule, 50,000 Units, Oral, Q7 Days  ezetimibe (ZETIA) tablet, 10 mg, Oral, Daily  gabapentin (NEURONTIN) capsule, 100 mg, Oral, 2x/day  HYDROcodone-acetaminophen (NORCO) 5-325 mg per tablet, 1 Tablet, Oral, Q4H PRN  losartan (COZAAR) tablet, 50 mg, Oral, Daily  melatonin tablet, 3 mg, Oral, NIGHTLY  melatonin tablet, 3 mg, Oral, HS PRN  metoprolol succinate (TOPROL-XL) 24 hr extended release tablet, 100 mg, Oral, Daily  ondansetron (ZOFRAN) 2 mg/mL injection, 4 mg, Intravenous, Q4H PRN  pancreatic enzyme replacement (CREON) 36,000 units lipase per cap, 1 Capsule, Oral,  2x/day-Food  pantoprazole (PROTONIX) delayed release tablet, 40 mg, Oral, Daily        Physical Exam:    Physical Exam  Vitals reviewed.   Constitutional:       General: She is not in acute distress.     Appearance: She is not ill-appearing.   HENT:      Head: Atraumatic.   Cardiovascular:      Rate and Rhythm: Normal rate.      Pulses: Normal pulses.   Pulmonary:      Effort: Pulmonary effort is normal. No respiratory distress.   Skin:     General: Skin is warm and dry.   Neurological:      Mental Status: She is alert.   Psychiatric:         Mood and Affect: Mood normal.           Labs:  No results found for this or any previous visit (from the past 24 hour(s)).         Imaging:           Microbiology:  No results  found for any visits on 09/28/21 (from the past 96 hour(s)).        Assessment/ Plan:   Active Hospital Problems    Diagnosis   . Primary Problem: Debility progressing reasonably well with physical therapy   . Hip fracture (CMS HCC) pain improving   . Pelvic fracture (CMS HCC)   . Chronic obstructive pulmonary disease, unspecified COPD type (CMS HCC) respiratory status stable   . Moderate protein-calorie malnutrition (CMS HCC) tolerating diet reasonably well   . HTN (hypertension) fluctuant may be pain induced continue to monitor          Disposition Planning:   Pending physical therapy    Carmelina Paddock, DO    This note was partially generated using MModal Fluency Direct system, and there may be some incorrect words, spellings, and punctuation that were not noted in checking the note before saving

## 2021-10-02 NOTE — Care Plan (Signed)
[facility]  Care Plan Note    Situation: debility    Intervention: medications, meal set up, nursing assessment and assist with adl's,physical therapy    Response: ongoing, patient wants to feel stronger      Baird Cancer, RN

## 2021-10-03 MED ORDER — MAGNESIUM HYDROXIDE 2,400 MG/10 ML ORAL SUSPENSION
10.0000 mL | Freq: Three times a day (TID) | ORAL | Status: DC | PRN
Start: 2021-10-03 — End: 2021-10-09
  Administered 2021-10-03: 2400 mg via ORAL
  Filled 2021-10-03: qty 10

## 2021-10-03 NOTE — Care Plan (Signed)
[facility]  Care Plan Note    Situation: Patient admitted for debility    Intervention: PT consult, lab draws, hourly rounding    Response: care plan ongoing      Arman Filter, RN    Problem: Health Knowledge, Opportunity to Enhance (Adult,Obstetrics,Pediatric)  Goal: Knowledgeable about Health Subject/Topic  Description: Patient will demonstrate the desired outcomes by discharge/transition of care.  Outcome: Ongoing (see interventions/notes)     Problem: Adult Inpatient Plan of Care  Goal: Plan of Care Review  Outcome: Ongoing (see interventions/notes)  Goal: Patient-Specific Goal (Individualized)  Outcome: Ongoing (see interventions/notes)  Goal: Absence of Hospital-Acquired Illness or Injury  Outcome: Ongoing (see interventions/notes)  Intervention: Prevent Skin Injury  Recent Flowsheet Documentation  Taken 10/03/2021 0806 by Arman Filter, RN  Skin Protection:   adhesive use limited   tubing/devices free from skin contact  Intervention: Prevent and Manage VTE (Venous Thromboembolism) Risk  Recent Flowsheet Documentation  Taken 10/03/2021 0806 by Arman Filter, RN  VTE Prevention/Management: ambulation promoted  Goal: Optimal Comfort and Wellbeing  Outcome: Ongoing (see interventions/notes)  Goal: Rounds/Family Conference  Outcome: Ongoing (see interventions/notes)     Problem: Discharge Needs Assessment  Goal: Discharge Needs Assessment  Outcome: Ongoing (see interventions/notes)     Problem: Pain Acute  Goal: Optimal Pain Control and Function  Outcome: Ongoing (see interventions/notes)     Problem: Fall Injury Risk  Goal: Absence of Fall and Fall-Related Injury  Outcome: Ongoing (see interventions/notes)     Problem: Skin Injury Risk Increased  Goal: Skin Health and Integrity  Outcome: Ongoing (see interventions/notes)  Intervention: Optimize Skin Protection  Recent Flowsheet Documentation  Taken 10/03/2021 0806 by Arman Filter, RN  Skin Protection:   adhesive use limited   tubing/devices free  from skin contact

## 2021-10-03 NOTE — Care Plan (Signed)
[facility]  Physical Therapy Progress Note    Patient Name: Theresa Greene  Date of Birth: 1934-08-16  Height:  167.6 cm (5' 6" )  Weight:  48.8 kg (107 lb 9.6 oz)    Room/Bed: 701/B  Payor: Payor: MEDICARE / Plan: MEDICARE PART A AND B / Product Type: Medicare /     Referring physician: Rex Kras, DO  Pertinent History of Current Functional Problem: 87 y.o. female s/p fall with subsequent R superior and inferior pubic rami fractures and nondisplaced subcapital femoral neck fracture previously fixated. Non operative treatment recommended with orders for WBAT on RLE. Pt transferred to swingbed program at [facility] for continued skilled rehabilitation services 09/28/21. 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    Start Time: 1416  End Time: 1437  Total Treatment Time: 21 minutes    Pt Start Position/alarms: Pt up in chair. No alarms.   Pt End Position/alarms:  Pt supine in bed with head elevated. Pt positioned for comfort and pressure relief. Call light, phone and hydration in reach. Alarm engaged. All needs met.     Assessment:       Pt participated  in gait training 55 ft x 2 with one seated rest break with step to gait pattern initially progressing to a step through gait pattern as weight acceptance improved on RLE. Pt continued stair training with training using rail and FWW performed this session. Pt required MIN A utilizing B rails and CGA using FWW with ongoing training necessary to improve her sequencing, safety and independence. Pt limited during session by c/o pain in posterior aspect of R hip and pelvis and in R knee with weightbearing. Ongoing training necessary to continue to make functional gains with bed mobility, transfers and stair negotiation with pt's goal to be more independent and be able to complete functional mobility,  toileting and meal prep without caregiver assistance. Pt motivated to return to her PLOF and is actively participating  in [place].       Discharge Needs:   Equipment Recommendation: TBD    Discharge Disposition: Home with home health     Plan:   Continue to follow patient according to established plan of care.  The risks/benefits of therapy have been discussed with the patient/caregiver and he/she is in agreement with the established plan of care.     Subjective & Objective:     Pt agreeable to session. Coordinated session with pain medication. Pt reports 4/10 pain in posterior aspect of R hip and pelvis and R knee with weightbearing activities. Pt completed tranfers sit <> stand with SBA and sit to supine to MIN A to assist with RLE. Pt participated  in gait training 55 ft x 2 with one seated rest break with step to gait pattern initially progressing to a step through gait pattern as weight acceptance improved on RLE. Pt continued stair training with trials using rail versus FWW performed this session with up to MIN A required with cues for sequencing and safety. Pt now reports she has two rails to access her home with goal to be updated. Pt limited during session by c/o pain in posterior aspect of R hip and pelvis and in R knee with weightbearing.       Sit to Supine: Min Assist  Sit to Stand: Stand-by Assist  Stand to  Sit: Stand-by Assist    Gait Training  Ambulation Device: Public affairs consultant Assist: Pension scheme manager Distance: 55 ft x 2    Stair Training: six inch step x 2 with bilateral rails and MIN A and one six inch step with FWW and MIN A>CGA      Physical Therapy Goals:   Bed Mobility Goal, Activity Type: supine to sit/sit to supine  Bed Mobility Goal, Independence Level: minimum assist (75% patient effort)   Bed Mobility Goal, Assistive Device: least restrictive assistive device    Transfer Training Goal, Activity Type: bed-to-chair/chair-to-bed,  sit-to-stand/stand-to-sit  Transfer Training Goal, Independence Level: modified independence   Transfer Training Goal, Assist Device: walker, rolling    Gait Training  Goal, Independence Level: stand-by assistance   Gait Training  Goal, Assist Device: walker, rolling   Gait Training  Goal, Distance to Achieve: 100    Stairs Training Goal, Independence Level: contact guard assist   Stairs Training Goal, Assist Device: handrail, left, handrail, right (One rail)   Stairs Training Goal, Number of Stairs to Achieve: 2      Therapist:   [name] DPT [phone]

## 2021-10-03 NOTE — Care Plan (Signed)
[facility]  Physical Therapy Progress Note    Patient Name: Theresa Greene  Date of Birth: [birth year]  Height:  167.6 cm (_0 )  Weight:  48.8 kg (107 lb 9.6 oz)    Room/Bed: 701/B  Payor: Payor: MEDICARE / Plan: MEDICARE PART A AND B / Product Type: Medicare /     Referring physician: Rex Kras, DO  Pertinent History of Current Functional Problem: 87 y.o. female s/p fall with subsequent R superior and inferior pubic rami fractures and nondisplaced subcapital femoral neck fracture previously fixated. Non operative treatment recommended with orders for WBAT on RLE. Pt transferred to swingbed program at [facility] for continued skilled rehabilitation services 09/28/21. 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    Start Time: 1128  End Time: 1143  Total Treatment Time: 15 minutes    Pt Start Position/alarms: Supine in bed. All needs in reach. Husband present.  Pt End Position/alarms:  Pt supine in bed with head elevated. Pt positioned for comfort and pressure relief. Call light, phone and hydration in reach.  All needs met. Husband present.      Assessment:         Pt actively participated in session focused on promoting LE strength for improved transfer independence and gait stability. Pt completed exercises per log with MIN A to complete SLR and hip ABD/ADD through range with pain in groin reported at 2/10. Plan to continue gait and stair training next session with pain medication to be coordinated with session per pt request. Continue POC to continue pt's progress with ambulation household distances with FWW and with stair negotiation in order to prepare her for a safe, sustainable discharge home with her husband and home health.     Discharge Needs:   Equipment Recommendation: TBD    Discharge Disposition: Home with home health    Plan:    Continue to follow patient according to established plan of care.  The risks/benefits of therapy have been discussed with the patient/caregiver and he/she is in agreement with the established plan of care.     Subjective & Objective:     Pt agreeable to session focused on promoting LE strength for improved transfer independence and gait stability. Pt completed exercises per log with MIN A to complete SLR and hip ABD/ADD through range with pain in groin reported at 2/10. Pt positioned for comfort at end of session with all needs in reach.     Pain: 2/10 R groin with there ex. No pain at rest.         Lower Extremity Exercises:  Ankle Pumps: 2 x 15 BLE  Glute Sets: 2 x 15   Quad Sets: 2 x 15 BLE  Heel Slides: 2 x 15 RLE with MIN A  SLR: 2 x 15 RLE with MIN A   Hip Abduction: 2 x 15 BLE with MIN A  Hip Adduction: 2 x 15 BLE with MIN A  SAQ sets: 2 x 15 RLE          Physical Therapy Goals:   Bed Mobility Goal, Activity Type: supine to sit/sit to supine  Bed Mobility Goal, Independence Level: minimum assist (75% patient effort)   Bed Mobility Goal, Assistive Device: least restrictive assistive device    Transfer Training Goal, Activity Type: bed-to-chair/chair-to-bed, sit-to-stand/stand-to-sit  Transfer Training Goal, Independence  Level: modified Information systems manager Goal, Assist Device: walker, rolling    Gait Training  Goal, Independence Level: stand-by assistance   Gait Training  Goal, Assist Device: walker, rolling   Gait Training  Goal, Distance to Achieve: 100    Stairs Training Goal, Independence Level: contact guard assist   Stairs Training Goal, Assist Device: handrail, left, handrail, right (One rail)   Stairs Training Goal, Number of Stairs to Achieve: 2      Therapist:   [name], PT [phone]

## 2021-10-03 NOTE — Care Plan (Signed)
[place]  Physical Therapy Progress Note    Patient Name: Theresa Greene  Date of Birth: 22-Nov-1934  Height:  167.6 cm (5' 6" )  Weight:  48.8 kg (107 lb 9.6 oz)    Room/Bed: 701/B  Payor: Payor: MEDICARE / Plan: MEDICARE PART A AND B / Product Type: Medicare /     Referring physician: Rex Kras, DO  Pertinent History of Current Functional Problem: 87 y.o. female s/p fall with subsequent R superior and inferior pubic rami fractures and nondisplaced subcapital femoral neck fracture previously fixated. Non operative treatment recommended with orders for WBAT on RLE. Pt transferred to swingbed program at [facility] for continued skilled rehabilitation services 09/28/21. 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    Start Time: 0908  End Time: 0936  Total Treatment Time: 28 minutes    Pt Start Position/alarms: Reclined in chair, call light in reach.    Pt End Position/alarms:  Reclined in chair, call light in reach and needs met.         Assessment:       Pt agreed to PT at this time. RN had given pain medication "an hour ago" before session. She did have slight limp with gait, cues given to push UE's thru walker to un weight R LE as it was causing pain. She walked over to infusion room where she did one 6 inch step. Pt was CGA with step. Pt needed seated rest break before doing step and after. Then walked short distance in hallway before return to room. She states "I don't think I could have done it without the pain pill." Pt completed walking and returned to room. Did seated strengthening exercises. Pt rating LAQ's on R LE at 10/10 pain. Educated on pain scale. Upon exit all needs met and call light in reach. Pt would benefit from continued skilled care to improve strength in LE's to aide with safe completion of stairs. Further stair training and  improve gait distance.        Discharge Needs:   Equipment Recommendation: TBD    Discharge Disposition: To be determined    Plan:   Continue to follow patient according to established plan of care.  The risks/benefits of therapy have been discussed with the patient/caregiver and he/she is in agreement with the established plan of care.     Subjective & Objective:     Pt reporting 5/10 pain in R knee, hip into back at this time. Pain pill given an hour ago. She completed gait, therapeutic exercise, and stair training. Checked with RN after session next pain pill due at 1300. Will try to arrange to see her after getting pain pill.       Sit to Stand: Stand-by Assist  Stand to Sit: Stand-by Assist    Gait Training  Ambulation Device: Conservation officer, nature  Ambulation Assist: Stand-by Assist  Ambulation Distance: 76f, 132f 8061f  Stair Training: 1x 6inch step CGA      Lower Extremity Exercises:      Heel/Toe Raises: 20x each   LAQ: 10x each   Hip Adduction: 10x pillow gentle                    Physical Therapy Goals:   Bed Mobility Goal, Activity Type: supine to sit/sit to supine  Bed  Mobility Goal, Independence Level: minimum assist (75% patient effort)   Bed Mobility Goal, Assistive Device: least restrictive assistive device    Transfer Training Goal, Activity Type: bed-to-chair/chair-to-bed, sit-to-stand/stand-to-sit  Transfer Training Goal, Independence Level: modified independence   Transfer Training Goal, Assist Device: walker, rolling    Gait Training  Goal, Independence Level: stand-by assistance   Gait Training  Goal, Assist Device: walker, rolling   Gait Training  Goal, Distance to Achieve: 100    Stairs Training Goal, Independence Level: contact guard assist   Stairs Training Goal, Assist Device: handrail, left, handrail, right (One rail)   Stairs Training Goal, Number of Stairs to Achieve: 2      Therapist:   Luiz Blare, PTA

## 2021-10-04 NOTE — Care Plan (Signed)
[place]  Physical Therapy Progress Note    Patient Name: Theresa Greene  Date of Birth: 10/13/1934  Height:  167.6 cm (5' 6" )  Weight:  50.9 kg (112 lb 3.2 oz)    Room/Bed: 701/B  Payor: Payor: MEDICARE / Plan: MEDICARE PART A AND B / Product Type: Medicare /     Referring physician: Rex Kras, DO  Pertinent History of Current Functional Problem: 87 y.o. female s/p fall with subsequent R superior and inferior pubic rami fractures and nondisplaced subcapital femoral neck fracture previously fixated. Non operative treatment recommended with orders for WBAT on RLE. Pt transferred to swingbed program at [facility] for continued skilled rehabilitation services 09/28/21. 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    Start Time: 1436  End Time: 1453  Total Treatment Time: 17 minutes    Pt Start Position/alarms: Reclined in geri chair, call light in reach.   Pt End Position/alarms:  Reclined in geri chair, call light in reach.         Assessment:       Pt completed gait in hallway, pt walking further distance this time. She walked past nurses station and back with [place]. She was fatigued but able to coax pt to walk short distance further past her room. She stood and took standing rest break. Pt safely returned to room. Upon exit all needs met and call light in reach and pt reclined in geri chair. Will continue skilled care to further progress gait distance for safe transition back to home and improve standing tolerance.       Discharge Needs:   Equipment Recommendation: TBD    Discharge Disposition: To be determined    Plan:   Continue to follow patient according to established plan of care.  The risks/benefits of therapy have been discussed with the patient/caregiver and he/she is in agreement with the established plan of care.     Subjective & Objective:      Pt reports 7/10 pain in her back. Pain pill given before session. She was assisted with donning soft back brace. She feels this helps her back when walker. SBA with sit to stand.     Sit to Stand: Stand-by Assist  Stand to Sit: Stand-by Assist    Gait Training  Ambulation Device: Conservation officer, nature  Ambulation Assist: Stand-by Assist  Ambulation Distance: 162f, 350f            Physical Therapy Goals:   Bed Mobility Goal, Activity Type: supine to sit/sit to supine  Bed Mobility Goal, Independence Level: minimum assist (75% patient effort)   Bed Mobility Goal, Assistive Device: least restrictive assistive device    Transfer Training Goal, Activity Type: bed-to-chair/chair-to-bed, sit-to-stand/stand-to-sit  Transfer Training Goal, Independence Level: modified independence   Transfer Training Goal, Assist Device: walker, rolling    Gait Training  Goal, Independence Level: stand-by assistance   Gait Training  Goal, Assist Device: walker, rolling   Gait Training  Goal, Distance to Achieve: 100    Stairs Training Goal, Independence Level: contact guard assist   Stairs Training Goal, Assist Device: least restrictive assistive device   Stairs Training Goal, Number of Stairs to Achieve: 2      Therapist:   JeLuiz BlarePTA

## 2021-10-04 NOTE — Care Plan (Signed)
[facility]  Care Plan Note    Situation: Patient admitted for debility    Intervention: PT consult, pain management, hourly rounding    Response: care plan ongoing      Arman Filter, RN    Problem: Health Knowledge, Opportunity to Enhance (Adult,Obstetrics,Pediatric)  Goal: Knowledgeable about Health Subject/Topic  Description: Patient will demonstrate the desired outcomes by discharge/transition of care.  Outcome: Ongoing (see interventions/notes)     Problem: Adult Inpatient Plan of Care  Goal: Plan of Care Review  Outcome: Ongoing (see interventions/notes)  Goal: Patient-Specific Goal (Individualized)  Outcome: Ongoing (see interventions/notes)  Goal: Absence of Hospital-Acquired Illness or Injury  Outcome: Ongoing (see interventions/notes)  Intervention: Prevent Skin Injury  Recent Flowsheet Documentation  Taken 10/04/2021 0800 by Arman Filter, RN  Skin Protection:   adhesive use limited   tubing/devices free from skin contact  Intervention: Prevent and Manage VTE (Venous Thromboembolism) Risk  Recent Flowsheet Documentation  Taken 10/04/2021 0800 by Arman Filter, RN  VTE Prevention/Management: anticoagulant therapy maintained  Goal: Optimal Comfort and Wellbeing  Outcome: Ongoing (see interventions/notes)  Goal: Rounds/Family Conference  Outcome: Ongoing (see interventions/notes)     Problem: Discharge Needs Assessment  Goal: Discharge Needs Assessment  Outcome: Ongoing (see interventions/notes)     Problem: Pain Acute  Goal: Optimal Pain Control and Function  Outcome: Ongoing (see interventions/notes)     Problem: Fall Injury Risk  Goal: Absence of Fall and Fall-Related Injury  Outcome: Ongoing (see interventions/notes)     Problem: Skin Injury Risk Increased  Goal: Skin Health and Integrity  Outcome: Ongoing (see interventions/notes)  Intervention: Optimize Skin Protection  Recent Flowsheet Documentation  Taken 10/04/2021 0800 by Arman Filter, RN  Skin Protection:   adhesive use limited    tubing/devices free from skin contact

## 2021-10-04 NOTE — Care Plan (Signed)
[place]  Physical Therapy Progress Note    Patient Name: Theresa Greene  Date of Birth: 1934/10/16  Height:  167.6 cm (_0 )  Weight:  50.9 kg (112 lb 3.2 oz)    Room/Bed: 701/B  Payor: Payor: MEDICARE / Plan: MEDICARE PART A AND B / Product Type: Medicare /     Referring physician: Rex Kras, DO  Pertinent History of Current Functional Problem: 87 y.o. female s/p fall with subsequent R superior and inferior pubic rami fractures and nondisplaced subcapital femoral neck fracture previously fixated. Non operative treatment recommended with orders for WBAT on RLE. Pt transferred to swingbed program at [facility] for continued skilled rehabilitation services 09/28/21. 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    Start Time: 1048  End Time: 1111  Total Treatment Time: 23 minutes    Pt Start Position/alarms: Pt supine in bed. No alarms.   Pt End Position/alarms:  Pt up in chair and positioned for comfort with call light, phone and hydration in reach. All needs met.     Assessment:       Pt reports 2/10 pain in posterior aspect of R hip and pelvis and R knee with weightbearing activities with trails completed with lumbar compression brace per pt request with pt reporting significant decreased pain compared to last session. Pt progressed to gait training 104 ft with FWW and CGA>SBA this session with improved consistency of with step through gait pattern with improved weight acceptance on RLE. Pt continued stair training with bilateral rails and FWW with progression to a CGA level with occasional cue for sequencing and safety. Ongoing training necessary to continue to make functional gains with bed mobility, transfers and stair negotiation with pt's goal to be more independent and be able to complete functional mobility, toileting and meal prep without  caregiver assistance. Pt motivated to return to her PLOF and is actively participating  in [place].       Discharge Needs:   Equipment Recommendation: TBD    Discharge Disposition: Home with home health     Plan:   Continue to follow patient according to established plan of care.  The risks/benefits of therapy have been discussed with the patient/caregiver and he/she is in agreement with the established plan of care.     Subjective & Objective:     Pt agreeable to session. Coordinated session with pain medication. Pt reports 2/10 pain in posterior aspect of R hip and pelvis and R knee with weightbearing activities with trails completed with lumbar compression brace per pt request with pt reporting significant decreased pain compared to last session. Pt completed tranfers sit <> stand with SBA and supine to sit at MOD I level with head of bed elevated and rail. Pt participated  in gait training 55 ft and 104 ft with FWW and CGA>SBA with step through gait pattern with improved weight acceptance on RLE. Pt continued stair training with bilateral rails and FWW with progression to a CGA level with occasional cue for sequencing and safety. Pt completed seated exercises to promote LE strength for improved gait stability and transfers without adverse reaction. Pt up in chair with LE elevated and positioned for comfort with all needs in reach at end of session.      Supine to Sit: Modified Independent   Sit to Stand: Stand-by  Assist  Stand to Sit: Stand-by Assist    Gait Training  Ambulation Device: Conservation officer, nature  Ambulation Assist: Pension scheme manager Distance: 26 ft and 104 ft    Stair Training: six inch step x 3 with bilateral rails and CGA and one six inch step with FWW and   CGA    Lower Extremity Exercises:    Sitting:  LAQ sets: x 20 BLE  SHF: x 20 BLE  Hip Adduction: x 20 BLE with pillow  Hip Abduction: x 20 BLE with therapist applied resistance   Ankle pumps: x 20 BLE      Physical Therapy Goals:    Bed Mobility Goal, Activity Type: supine to sit/sit to supine  Bed Mobility Goal, Independence Level: minimum assist (75% patient effort)   Bed Mobility Goal, Assistive Device: least restrictive assistive device    Transfer Training Goal, Activity Type: bed-to-chair/chair-to-bed, sit-to-stand/stand-to-sit  Transfer Training Goal, Independence Level: modified independence   Transfer Training Goal, Assist Device: walker, rolling    Gait Training  Goal, Independence Level: stand-by assistance   Gait Training  Goal, Assist Device: walker, rolling   Gait Training  Goal, Distance to Achieve: 100    Stairs Training Goal, Independence Level: contact guard assist   Stairs Training Goal, Assist Device: least restrictive assistive device   Stairs Training Goal, Number of Stairs to Achieve: 2      Therapist:   [name] DPT [phone]

## 2021-10-04 NOTE — Care Plan (Signed)
1409- Pt wanting pain pill before gait.

## 2021-10-04 NOTE — Progress Notes (Signed)
Nutrition f/u    Swing bed status  Weight 112 3.2# borderline underweight  Medications reviewed- colace in use  Cardiac diet, appetite varies 25-100%, set up help  Continue with plan

## 2021-10-04 NOTE — Progress Notes (Signed)
[facility]  Progress Note    Theresa Greene  Date of service: 10/04/2021  Date of Admission:  09/28/2021    Patient Complaints/Subjective: right lower back area pain with weight bearing, was doing well prior to this, denies pain with hip ROM while laying down, reports 3 lower back surgeries    Objective: CT pelvis reviewed, no signs of any acute lumbar spine or SI joint pathology, does have severe degen findings lumbar spine    acetaminophen (TYLENOL) tablet, 650 mg, Oral, Q6H PRN  amiodarone (CORDARONE) tablet, 100 mg, Oral, Daily  amLODIPine (NORVASC) tablet, 5 mg, Oral, Daily  atorvastatin (LIPITOR) tablet, 20 mg, Oral, Daily  cephalexin (KEFLEX) capsule, 500 mg, Oral, 2x/day  clopidogrel (PLAVIX) 75 mg tablet, 75 mg, Oral, Daily  docusate sodium (COLACE) capsule, 100 mg, Oral, 2x/day  enoxaparin PF (LOVENOX) 30 mg/0.3 mL SubQ injection, 30 mg, Subcutaneous, Daily  ergocalciferol-vitamin D2 (DRISDOL) capsule, 50,000 Units, Oral, Q7 Days  ezetimibe (ZETIA) tablet, 10 mg, Oral, Daily  gabapentin (NEURONTIN) capsule, 100 mg, Oral, 2x/day  HYDROcodone-acetaminophen (NORCO) 5-325 mg per tablet, 1 Tablet, Oral, Q4H PRN  losartan (COZAAR) tablet, 50 mg, Oral, Daily  magnesium hydroxide (MILK OF MAGNESIA) 2,400 mg per 10 mL oral suspension, 10 mL, Oral, 3x/day PRN  melatonin tablet, 3 mg, Oral, NIGHTLY  melatonin tablet, 3 mg, Oral, HS PRN  metoprolol succinate (TOPROL-XL) 24 hr extended release tablet, 100 mg, Oral, Daily  ondansetron (ZOFRAN) 2 mg/mL injection, 4 mg, Intravenous, Q4H PRN  pancreatic enzyme replacement (CREON) 36,000 units lipase per cap, 1 Capsule, Oral, 2x/day-Food  pantoprazole (PROTONIX) delayed release tablet, 40 mg, Oral, Daily        Vital Signs:  Vitals:    10/03/21 1426 10/03/21 2151 10/04/21 0601 10/04/21 0800   BP:    (!) 148/70   Pulse:    69   Resp: 18 18 18 18    Temp:    36.8 C (98.3 F)   SpO2:    97%   Weight:   50.9 kg (112 lb 3.2 oz)    Height:       BMI:                 Physical Exam:    General:  No acute distress.  Alert. Oriented. Frail appearing.  Skin:  Warm, pink, no lesions, no edema.  Head:  Atraumatic, normocephalic.  Eyes:  Pupils equal.  Ocular motor intact.  No scleral icterus or conjunctiva.  ENT:  No tracheal deviation.  Oral mucosa moist/pink.  Normal nose/ears.  Chest:  Symmetrical excursion.  No dyspnea.  No audible wheezes.  Cardiovascular:  Regular palpable distal pulses.  Brisk papillary refill.  Abdomen:  Soft. Non-distended.  Neuro:  UE and LE sensation and motor grossly intact.  No tremor.    Musculoskeletal: hip motion non-tender, neg impingement  Wound: none    Output by Drain (mL) 10/02/21 0700 - 10/02/21 1859 10/02/21 1900 - 10/03/21 0659 10/03/21 0700 - 10/03/21 1859 10/03/21 1900 - 10/04/21 0659 10/04/21 0700 - 10/04/21 0757   Patient has no LDAs of requested type attached.       Labs:  No results found for this or any previous visit (from the past 24 hour(s)).     Imaging:         Microbiology:  No results found for any visits on 09/28/21 (from the past 96 hour(s)).    Assessment/ Plan:   Active Hospital Problems    Diagnosis   .  Primary Problem: Debility   . Hip fracture (CMS HCC)   . Pelvic fracture (CMS HCC)   . Chronic obstructive pulmonary disease, unspecified COPD type (CMS HCC)   . Moderate protein-calorie malnutrition (CMS HCC)   . HTN (hypertension)        Hospital Day: 10, Rt sup and inf pubic rami fx, nondisplaced rt fem neck fx with fixation from from TFN transfixing, right lower back/SI joint pain, conserv tx for now, may need spine eval if continues to limit pt  Therapy: non op tx, continue therapy as tolerated  DVT prophylaxis: per others  Medical Concerns: per others  Disposition Planning: per others, f/u in Ortho clinic 4-6 wks after injury with xrays pelvis/rt hip    Gerrie Nordmann, MD    This note was partially generated using MModal Fluency Direct system, and there may be some incorrect words, spellings, and punctuation  that were not noted in checking the note before saving

## 2021-10-05 ENCOUNTER — Encounter (INDEPENDENT_AMBULATORY_CARE_PROVIDER_SITE_OTHER): Payer: Self-pay | Admitting: Ophthalmology

## 2021-10-05 NOTE — Care Plan (Signed)
Attempted to see patient at 1530. Patient stating she will participate in gait training 30 minutes after she receives her pain medication. RN aware.     Theresa Greene, PT [phone]

## 2021-10-05 NOTE — Care Plan (Signed)
Hospital  Care Plan Note    Situation: debility    Intervention: nursing care    Response: Pt is resting well in bed, call light and bed side table in reach.      Gricelda Foland, RN

## 2021-10-05 NOTE — Care Plan (Signed)
[place]  Physical Therapy Progress Note    Patient Name: Theresa Greene  Date of Birth: [birth year]  Height:  167.6 cm (_0 )  Weight:  50.9 kg (112 lb 3.2 oz)    Room/Bed: 701/B  Payor: Payor: MEDICARE / Plan: MEDICARE PART A AND B / Product Type: Medicare /     Referring physician: Rex Kras, DO  Pertinent History of Current Functional Problem: 87 y.o. female s/p fall with subsequent R superior and inferior pubic rami fractures and nondisplaced subcapital femoral neck fracture previously fixated. Non operative treatment recommended with orders for WBAT on RLE. Pt transferred to swingbed program at [facility] for continued skilled rehabilitation services 09/28/21. 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    Start Time: 1255  End Time: 1317  Total Treatment Time: 22 minutes    Pt Start Position/alarms: reclined in chair, call light in reach and tabs attached.   Pt End Position/alarms:  Reclined in chair, call light in reach and tabs attached.         Assessment:       Pt completed seated therapeutic exercises and in reclined position as well. Good tolerance and she did agree to some standing as well. Pt very limited and having difficulty with single limb support. Pt placed back into chair and legs reclined. Tabs attached and all needs met. Will continue per POC. Pt to continue skilled care to further build strength to improve function and aide with ADL's.       Discharge Needs:   Equipment Recommendation: TBD    Discharge Disposition: To be determined    Plan:   Continue to follow patient according to established plan of care.  The risks/benefits of therapy have been discussed with the patient/caregiver and he/she is in agreement with the established plan of care.     Subjective & Objective:     Pt reports 8/10 pain in back and her knee has  been bothering her. She also tells me her husband is in the ER and thinks he may have had a stroke. She agreed to therapeutic exercises but refused gait and wants pain medication before walking again.       Sit to Stand: Stand-by Assist  Stand to Sit: Stand-by Assist      Lower Extremity Exercises:    Glute Sets: 15x   Quad sets 15x each     Heel/Toe Raises: 20x each   LAQ: 20x each       Standing:     Marches: 1x each in FWW very challenged with lifting either foot.    Mini-Squat: 10x in FWW  FT EO 30 seconds in FWW SBA                Physical Therapy Goals:   Bed Mobility Goal, Activity Type: supine to sit/sit to supine  Bed Mobility Goal, Independence Level: minimum assist (75% patient effort)   Bed Mobility Goal, Assistive Device: least restrictive assistive device    Transfer Training Goal, Activity Type: bed-to-chair/chair-to-bed, sit-to-stand/stand-to-sit  Transfer Training Goal, Independence Level: modified independence   Transfer Training Goal, Assist Device: walker, rolling    Gait Training  Goal, Independence Level: stand-by assistance   Gait Training  Goal, Assist Device: walker, rolling   Gait Training  Goal, Distance to Achieve: 100    Stairs  Training Goal, Independence Level: contact guard assist   Stairs Training Goal, Assist Device: least restrictive assistive device   Stairs Training Goal, Number of Stairs to Achieve: 2      Therapist:   Luiz Blare, PTA

## 2021-10-05 NOTE — Care Plan (Signed)
[place]  Physical Therapy Progress Note    Patient Name: Theresa Greene  Date of Birth: 12-25-1934  Height:  167.6 cm (5\' 6" )  Weight:  50.9 kg (112 lb 3.2 oz)    Room/Bed: 701/B  Payor: Payor: MEDICARE / Plan: MEDICARE PART A AND B / Product Type: Medicare /     Referring physician: Rex Kras, DO  Pertinent History of Current Functional Problem: 87 y.o. female s/p fall with subsequent R superior and inferior pubic rami fractures and nondisplaced subcapital femoral neck fracture previously fixated. Non operative treatment recommended with orders for WBAT on RLE. Pt transferred to swingbed program at [facility] for continued skilled rehabilitation services 09/28/21. 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    Start Time: 1600  End Time: 1611  Total Treatment Time: 11 minutes    Pt Start Position/alarms: Patient supine in bed. No alarm. Call light & tray table with in reach.  Pt End Position/alarms:  Patient sitting in chair in husband's room, 707-B.         Assessment:       Patient agrees to ambulation now as pain medication has been given. Patient stating that her husband has been admitted to the floor & would to visit with him. She ambulated 140 feet using FWW & with SBA to his room. Patient does report pain during ambulation, but states it is improving & believes her walking is slowly improving as well. Patient requires ongoing skilled PT services to improve transfers, ambulation, & tolerance to all functional activity prior to discharge.       Discharge Needs:   Equipment Recommendation: TBD    Discharge Disposition: To be determined    Plan:   Continue to follow patient according to established plan of care.  The risks/benefits of therapy have been discussed with the patient/caregiver and he/she is in agreement with the established plan of  care.     Subjective & Objective:     Patient agreeable to PT session. Ambulated from room 701B to 707-B. Nurse supervisor, unit clerk, & RN all aware patient was left sitting in chair in her husband's room.     Pain: No pain while supine in bed. Pain while ambulating, but not rated.     Supine to Sit: Modified Independent  Sit to Stand: Stand-by Assist  Stand to Sit: Stand-by Assist    Gait Training  Ambulation Device: Rolling Walker  Ambulation Assist: Stand-by Assist  Ambulation Distance: 140 feet        Physical Therapy Goals:   Bed Mobility Goal, Activity Type: supine to sit/sit to supine  Bed Mobility Goal, Independence Level: minimum assist (75% patient effort)   Bed Mobility Goal, Assistive Device: least restrictive assistive device    Transfer Training Goal, Activity Type: bed-to-chair/chair-to-bed, sit-to-stand/stand-to-sit  Transfer Training Goal, Independence Level: modified independence   Transfer Training Goal, Assist Device: walker, rolling    Gait Training  Goal, Independence Level: stand-by assistance   Gait Training  Goal, Assist Device: walker, rolling   Gait Training  Goal, Distance to Achieve: 100    Stairs Training Goal, Independence Level: contact guard assist   Stairs Training Goal, Assist Device: least restrictive assistive device   Stairs Training Goal, Number of Stairs to Achieve: 2      Therapist:   Curt Bears,  PT [phone]

## 2021-10-05 NOTE — Care Plan (Signed)
[place]  Physical Therapy Progress Note    Patient Name: Theresa Greene  Date of Birth: 1935/08/07  Height:  167.6 cm (_0 )  Weight:  50.9 kg (112 lb 3.2 oz)    Room/Bed: 701/B  Payor: Payor: MEDICARE / Plan: MEDICARE PART A AND B / Product Type: Medicare /     Referring physician: Rex Kras, DO  Pertinent History of Current Functional Problem: 87 y.o. female s/p fall with subsequent R superior and inferior pubic rami fractures and nondisplaced subcapital femoral neck fracture previously fixated. Non operative treatment recommended with orders for WBAT on RLE. Pt transferred to swingbed program at [facility] for continued skilled rehabilitation services 09/28/21. 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    Start Time: 1137  End Time: 1154  Total Treatment Time: 17 minutes    Pt Start Position/alarms: Pt up in chair. TABS alarm in place.  Pt End Position/alarms:  Pt up in chair and positioned for comfort with call light, phone and hydration in reach. All needs met. TABS alarm in place.     Assessment:       Pt limited by c/o increased fatigue and pain reported in posterior aspect of R hip and pelvis and R knee with weightbearing activities. Pt progressed to gait training 118 ft with FWW and CGA>SBA this session but required frequent standing restbreaks with decreased weight acceptance onto  RLE compared to 10/04/21. Ongoing training necessary to continue to make functional gains with bed mobility, transfers and stair negotiation with pt's goal to be more independent and be able to complete functional mobility, toileting and meal prep without caregiver assistance. Pt motivated to return to her PLOF and is actively participating  in [place].       Discharge Needs:   Equipment Recommendation: TBD    Discharge Disposition: Home with home health      Plan:   Continue to follow patient according to established plan of care.  The risks/benefits of therapy have been discussed with the patient/caregiver and he/she is in agreement with the established plan of care.     Subjective & Objective:     Pt agreeable to session but reports she did not rest well last night and is worried about her husband who is currently in the emergency room. Coordinated session with pain medication; however, pt reports more pain this date in posterior aspect of R hip and pelvis and R knee with weightbearing activities. Continued use of lumbar compression brace per pt request with pt reporting decreased pain when donned; however, pt demonstrates decreased weight acceptance onto RLE during stance compared to 10/04/21. Pt completed tranfers sit <> stand with SBA and completed gait training 118 ft with FWW and CGA>SBA with step through gait pattern frequent standing restbreaks due to pt reported fatigue and pain in RLE. Pt completed seated exercises to promote LE strength for improved gait stability and transfers without adverse reaction. Pt up in chair with LE elevated and positioned for comfort with all needs in reach at end of session.      Sit to Stand: Stand-by Assist  Stand to Sit: Stand-by Assist    Gait Training  Ambulation Device: Conservation officer, nature  Ambulation Assist: Stand-by Assist, Contact Guard  Ambulation Distance: 118 ft      Lower Extremity Exercises:    Sitting:  LAQ sets: x 20 BLE  SHF: x 20 BLE with MIN A on R to improve range   Hip Adduction: x 20 BLE with pillow  Hip Abduction: x 20 BLE with therapist applied resistance   Ankle pumps: x 20 BLE      Physical Therapy Goals:   Bed Mobility Goal, Activity Type: supine to sit/sit to supine  Bed Mobility Goal, Independence Level: minimum assist (75% patient effort)   Bed Mobility Goal, Assistive Device: least restrictive assistive device    Transfer Training Goal, Activity Type: bed-to-chair/chair-to-bed,  sit-to-stand/stand-to-sit  Transfer Training Goal, Independence Level: modified independence   Transfer Training Goal, Assist Device: walker, rolling    Gait Training  Goal, Independence Level: stand-by assistance   Gait Training  Goal, Assist Device: walker, rolling   Gait Training  Goal, Distance to Achieve: 100    Stairs Training Goal, Independence Level: contact guard assist   Stairs Training Goal, Assist Device: least restrictive assistive device   Stairs Training Goal, Number of Stairs to Achieve: 2      Therapist:   [name] DPT [phone]

## 2021-10-06 NOTE — Care Plan (Signed)
[place]  Physical Therapy Progress Note    Patient Name: Journey Castonguay  Date of Birth: [birth year]  Height:  167.6 cm (5' 6" )  Weight:  50.9 kg (112 lb 3.2 oz)    Room/Bed: 707/A  Payor: Payor: MEDICARE / Plan: MEDICARE PART A AND B / Product Type: Medicare /     Referring physician: Rex Kras, DO  Pertinent History of Current Functional Problem: 87 y.o. female s/p fall with subsequent R superior and inferior pubic rami fractures and nondisplaced subcapital femoral neck fracture previously fixated. Non operative treatment recommended with orders for WBAT on RLE. Pt transferred to swingbed program at [facility] for continued skilled rehabilitation services 09/28/21. 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    Start Time: 1539  End Time: 1602  Total Treatment Time: 23 minutes    Pt Start Position/alarms: Pt supine in bed. No alarms.   Pt End Position/alarms:  Pt up in chair and positioned for comfort with call light, phone and hydration in reach. All needs met.     Assessment:       Pt reports 3/10 pain in posterior aspect of R hip and pelvis and R knee with weightbearing activities lumbar compression brace donned for session. Pt to have daughter bring in R knee brace for trials. Pt continued gait training 100 ft x 2 with FWW and SBA this session with step through gait pattern with good weight acceptance on RLE. Pt continued stair training with bilateral rails and FWW with progression to a CGA>SBA level without cues for sequencing. Ongoing training necessary to continue to make functional gains with bed mobility, transfers and stair negotiation with pt's goal to be more independent and be able to complete functional mobility, toileting and meal prep without caregiver assistance. Pt motivated to return to her PLOF with gait goal upgraded this  session:    Gait Training  Goal, Independence Level: Modified Independent   Gait Training  Goal, Assist Device: walker, rolling   Gait Training  Goal, Distance to Achieve: 150 ft      Discharge Needs:   Equipment Recommendation: TBD    Discharge Disposition: Home with home health     Plan:   Continue to follow patient according to established plan of care.  The risks/benefits of therapy have been discussed with the patient/caregiver and he/she is in agreement with the established plan of care.     Subjective & Objective:     Pt agreeable to session. Coordinated session with pain medication. Pt reports 3/10 pain in posterior aspect of R hip and pelvis and R knee with weightbearing activities with lumbar compression brace donned for session. Pt completed tranfers sit <> stand with SBA and supine to sit at MOD I level with head of bed elevated and rail. Pt participated  in gait training 100 ft x 2 with FWW and SBA with step through gait pattern with good weight acceptance on RLE. Pt continued stair training with bilateral rails and FWW with performance at a CGA >SBA level without cues for sequencing this session. Pt up in chair with LE elevated and positioned for comfort with all needs in reach at end of session.      Supine to Sit: Modified Independent   Sit to Stand: Stand-by Assist  Stand to Sit: Stand-by Assist  Gait Training  Ambulation Device: Conservation officer, nature  Ambulation Assist: Stand-by Assist  Ambulation Distance: 100 ft x 2     Stair Training: six inch step x 3 with bilateral rails and CGA>SBA         Physical Therapy Goals:   Bed Mobility Goal, Activity Type: supine to sit/sit to supine  Bed Mobility Goal, Independence Level: minimum assist (75% patient effort)   Bed Mobility Goal, Assistive Device: least restrictive assistive device    Transfer Training Goal, Activity Type: bed-to-chair/chair-to-bed, sit-to-stand/stand-to-sit  Transfer Training Goal, Independence Level: modified independence   Transfer  Training Goal, Assist Device: walker, rolling    Gait Training  Goal, Independence Level: stand-by assistance   Gait Training  Goal, Assist Device: walker, rolling   Gait Training  Goal, Distance to Achieve: 100    Stairs Training Goal, Independence Level: contact guard assist   Stairs Training Goal, Assist Device: least restrictive assistive device   Stairs Training Goal, Number of Stairs to Achieve: 2      Therapist:   [name] DPT [phone]

## 2021-10-06 NOTE — Progress Notes (Signed)
[facility]  Progress Note    Theresa Greene  Date of service: 10/06/2021  Date of Admission:  09/28/2021  Hospital Day:  LOS: 8 days     Subjective:  Continues to have 4/10 low back pain which is improved over the past several days    Review of Systems:    Review of Systems   Musculoskeletal: Positive for back pain and joint pain.         Objective:      Vital Signs:  Vitals:    10/05/21 1637 10/05/21 2147 10/06/21 0632 10/06/21 0800   BP:    (!) 154/74   Pulse:    70   Resp: 18 18 15 18    Temp:    36.9 C (98.5 F)   SpO2:    96%   Weight:       Height:       BMI:                I/O:  I/O last 24 hours:      Intake/Output Summary (Last 24 hours) at 10/06/2021 0838  Last data filed at 10/06/2021 0600  Gross per 24 hour   Intake 1440 ml   Output --   Net 1440 ml         acetaminophen (TYLENOL) tablet, 650 mg, Oral, Q6H PRN  amiodarone (CORDARONE) tablet, 100 mg, Oral, Daily  amLODIPine (NORVASC) tablet, 5 mg, Oral, Daily  atorvastatin (LIPITOR) tablet, 20 mg, Oral, Daily  clopidogrel (PLAVIX) 75 mg tablet, 75 mg, Oral, Daily  docusate sodium (COLACE) capsule, 100 mg, Oral, 2x/day  enoxaparin PF (LOVENOX) 30 mg/0.3 mL SubQ injection, 30 mg, Subcutaneous, Daily  ergocalciferol-vitamin D2 (DRISDOL) capsule, 50,000 Units, Oral, Q7 Days  ezetimibe (ZETIA) tablet, 10 mg, Oral, Daily  gabapentin (NEURONTIN) capsule, 100 mg, Oral, 2x/day  HYDROcodone-acetaminophen (NORCO) 5-325 mg per tablet, 1 Tablet, Oral, Q4H PRN  losartan (COZAAR) tablet, 50 mg, Oral, Daily  magnesium hydroxide (MILK OF MAGNESIA) 2,400 mg per 10 mL oral suspension, 10 mL, Oral, 3x/day PRN  melatonin tablet, 3 mg, Oral, NIGHTLY  melatonin tablet, 3 mg, Oral, HS PRN  metoprolol succinate (TOPROL-XL) 24 hr extended release tablet, 100 mg, Oral, Daily  ondansetron (ZOFRAN) 2 mg/mL injection, 4 mg, Intravenous, Q4H PRN  pancreatic enzyme replacement (CREON) 36,000 units lipase per cap, 1 Capsule, Oral, 2x/day-Food  pantoprazole (PROTONIX)  delayed release tablet, 40 mg, Oral, Daily        Physical Exam:    Physical Exam  Vitals reviewed.   Constitutional:       General: She is not in acute distress.     Appearance: She is not ill-appearing.   HENT:      Head: Atraumatic.   Cardiovascular:      Pulses: Normal pulses.   Pulmonary:      Effort: Pulmonary effort is normal. No respiratory distress.   Musculoskeletal:         General: No swelling.   Skin:     General: Skin is warm and dry.   Neurological:      Mental Status: She is alert and oriented to person, place, and time.           Labs:  No results found for this or any previous visit (from the past 24 hour(s)).         Imaging:           Microbiology:  No results found for any visits on 09/28/21 (from  the past 96 hour(s)).        Assessment/ Plan:   Active Hospital Problems    Diagnosis   . Primary Problem: Debility   . Hip fracture (CMS HCC)   . Pelvic fracture (CMS HCC)   . Chronic obstructive pulmonary disease, unspecified COPD type (CMS HCC)   . Moderate protein-calorie malnutrition (CMS HCC)   . HTN (hypertension)      Pain improving, progressing well with physical therapy continue current    Disposition Planning:   At physical therapies discretion    Carmelina Paddock, DO    This note was partially generated using MModal Fluency Direct system, and there may be some incorrect words, spellings, and punctuation that were not noted in checking the note before saving

## 2021-10-06 NOTE — Care Plan (Signed)
[place]  Physical Therapy Progress Note    Patient Name: Theresa Greene  Date of Birth: 1934-10-08  Height:  167.6 cm (_0 )  Weight:  50.9 kg (112 lb 3.2 oz)    Room/Bed: 701/B  Payor: Payor: MEDICARE / Plan: MEDICARE PART A AND B / Product Type: Medicare /     Referring physician: Rex Kras, DO  Pertinent History of Current Functional Problem: 87 y.o. female s/p fall with subsequent R superior and inferior pubic rami fractures and nondisplaced subcapital femoral neck fracture previously fixated. Non operative treatment recommended with orders for WBAT on RLE. Pt transferred to swingbed program at [facility] for continued skilled rehabilitation services 09/28/21. 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    Start Time: 0732  End Time: 0756  Total Treatment Time: 24 minutes    Pt Start Position/alarms: Supine in bed with head elevated, call light in reach, communication with nursing prior  Pt End Position/alarms:  Seated in chair with LE elevated, call light in reach, bedside table in front of patient with breakfast, all needs met        Assessment:       Today's session focused on improving pt's functional endurance and ability to safely and more independently ambulate, perform transfers, and perform ADLs. Pt requests to visit husband in his room, nursing station confirms room number during gait training. Pt was able to ambulate with good step length and no significant gait deviations. Pt does report pain while ambulating. Pt was able to ambulate without loss of balance, able to reach husband's room and is able to stand with FWW and speak to her husband, is able to lean over while pt is in bed and speak to husband without loss of balance and use of UE support of FWW with supervision. Prior to initiating gait training pt performs transfer to  use bedside commode. Pt is able to stand, remove briefs, clean after using bathroom with supervision and no manual assistance from therapist and with no loss of balance in the process. Pt does report back pain and knee pain at end of session, states okay in L knee when setting up table for breakfast, no visible signs of change in skin or overall status compared to beginning of today's treatment session. Based on goals, pt achieved distance for gait training goal (see goals). Based on reports of back pain, plan is for this therapist to collaborate with primary hospital physical therapist Warren Lacy Donna Christen, PT) on management of pt's symptoms of back pain and POC (note pt has already reported having pain medication this morning).       Discharge Needs:   Equipment Recommendation: TBD    Discharge Disposition: To be determined    Plan:   Continue to follow patient according to established plan of care.  The risks/benefits of therapy have been discussed with the patient/caregiver and he/she is in agreement with the established plan of care.     Subjective & Objective:     Pt is agreeable to treatment session, states she has had her pain medication this morning and reports no pain. Pt states that her daughter is in town to take care of their home and help them due to her husband now being in this hospital as well, states she visited his room the other day. Pt states  she has a knee brace at home that she would wear occasionally prior to injury. Pt states that her back and knee have been hurting. Pt reports while ambulating pain is 6/10 pain on the NPRS, at end of session rates it at 8/10 pain on the NPRS. Light bump of L knee on table with nursing getting table ready for breakfast at end of session, states she is okay, does not show any signs of physical distress.   Gait training and therapeutic activities for transfers and to sit at bedside commode to use bathroom.     Bed Mobility: Stand-by Assist  Supine to Sit: Stand-by  Assist  Sit to Stand: Supervision  Stand to Sit: Supervision    Gait Training  Ambulation Device: Rolling Walker, gait belt on  Ambulation Assist: Supervision  Ambulation Distance: 237 ft             Physical Therapy Goals:   Bed Mobility Goal, Activity Type: supine to sit/sit to supine  Bed Mobility Goal, Independence Level: minimum assist (75% patient effort)   Bed Mobility Goal, Assistive Device: least restrictive assistive device    Transfer Training Goal, Activity Type: bed-to-chair/chair-to-bed, sit-to-stand/stand-to-sit  Transfer Training Goal, Independence Level: modified independence   Transfer Training Goal, Assist Device: walker, rolling    Gait Training  Goal, Independence Level: stand-by assistance   Gait Training  Goal, Assist Device: walker, rolling   Gait Training  Goal, Distance to Achieve: 100    Stairs Training Goal, Independence Level: contact guard assist   Stairs Training Goal, Assist Device: least restrictive assistive device   Stairs Training Goal, Number of Stairs to Achieve: 2      Therapist:   Charolett Bumpers, DPT [phone]

## 2021-10-06 NOTE — Care Plan (Signed)
[facility]  Care Plan Note    Situation: debility    Intervention: nursing care    Response: pt is resting well in bed.       Selestino Nila, RN

## 2021-10-07 NOTE — Progress Notes (Signed)
[facility]  Progress Note    Theresa Greene  Date of service: 10/07/2021  Date of Admission:  09/28/2021  Hospital Day:  LOS: 9 days     Subjective:  Theresa Greene has no new complaints today.  She has some diffuse myalgias but denies shortness of breath, weakness, nausea, vomiting.  Pain is controlled    Review of Systems:    Review of Systems   Constitutional: Positive for malaise/fatigue. Negative for chills, diaphoresis, fever and weight loss.   HENT: Negative.  Negative for congestion, ear discharge, ear pain, hearing loss, nosebleeds, sinus pain, sore throat and tinnitus.    Eyes: Negative.    Respiratory: Negative for cough, hemoptysis, sputum production, shortness of breath, wheezing and stridor.    Cardiovascular: Negative.  Negative for chest pain, palpitations, orthopnea, claudication, leg swelling and PND.   Gastrointestinal: Negative for abdominal pain, blood in stool, constipation, diarrhea, heartburn, nausea and vomiting.   Genitourinary: Negative.  Negative for dysuria, flank pain, frequency, hematuria and urgency.   Musculoskeletal: Positive for back pain and myalgias. Negative for falls, joint pain and neck pain.   Skin: Negative.    Neurological: Positive for weakness. Negative for dizziness, tingling, tremors, sensory change, speech change, focal weakness, seizures, loss of consciousness and headaches.   Psychiatric/Behavioral: Negative for depression, hallucinations, memory loss, substance abuse and suicidal ideas. The patient is not nervous/anxious and does not have insomnia.          Objective:     Vital Signs:  Vitals:    10/06/21 0800 10/06/21 1421 10/06/21 2200 10/07/21 0800   BP: (!) 154/74   (!) 164/80   Pulse: 70   69   Resp: 18 18 18 18    Temp: 36.9 C (98.5 F)   36.7 C (98.1 F)   SpO2: 96%   92%   Weight:       Height:       BMI:                I/O:  I/O last 24 hours:      Intake/Output Summary (Last 24 hours) at 10/07/2021 1309  Last data filed at 10/07/2021 0600  Gross  per 24 hour   Intake 1800 ml   Output --   Net 1800 ml         acetaminophen (TYLENOL) tablet, 650 mg, Oral, Q6H PRN  amiodarone (CORDARONE) tablet, 100 mg, Oral, Daily  amLODIPine (NORVASC) tablet, 5 mg, Oral, Daily  atorvastatin (LIPITOR) tablet, 20 mg, Oral, Daily  clopidogrel (PLAVIX) 75 mg tablet, 75 mg, Oral, Daily  docusate sodium (COLACE) capsule, 100 mg, Oral, 2x/day  enoxaparin PF (LOVENOX) 30 mg/0.3 mL SubQ injection, 30 mg, Subcutaneous, Daily  ergocalciferol-vitamin D2 (DRISDOL) capsule, 50,000 Units, Oral, Q7 Days  ezetimibe (ZETIA) tablet, 10 mg, Oral, Daily  gabapentin (NEURONTIN) capsule, 100 mg, Oral, 2x/day  HYDROcodone-acetaminophen (NORCO) 5-325 mg per tablet, 1 Tablet, Oral, Q4H PRN  losartan (COZAAR) tablet, 50 mg, Oral, Daily  magnesium hydroxide (MILK OF MAGNESIA) 2,400 mg per 10 mL oral suspension, 10 mL, Oral, 3x/day PRN  melatonin tablet, 3 mg, Oral, NIGHTLY  melatonin tablet, 3 mg, Oral, HS PRN  metoprolol succinate (TOPROL-XL) 24 hr extended release tablet, 100 mg, Oral, Daily  ondansetron (ZOFRAN) 2 mg/mL injection, 4 mg, Intravenous, Q4H PRN  pancreatic enzyme replacement (CREON) 36,000 units lipase per cap, 1 Capsule, Oral, 2x/day-Food  pantoprazole (PROTONIX) delayed release tablet, 40 mg, Oral, Daily  Physical Exam:    Physical Exam  Vitals and nursing note reviewed.   Constitutional:       General: She is not in acute distress.     Appearance: Normal appearance.   HENT:      Head: Normocephalic and atraumatic.      Right Ear: Tympanic membrane normal.      Left Ear: Tympanic membrane normal.      Mouth/Throat:      Mouth: Mucous membranes are moist.   Eyes:      Extraocular Movements: Extraocular movements intact.      Pupils: Pupils are equal, round, and reactive to light.   Neck:      Vascular: No carotid bruit.   Cardiovascular:      Rate and Rhythm: Normal rate and regular rhythm.      Pulses: Normal pulses.      Heart sounds: Murmur heard.     No gallop.   Pulmonary:       Effort: Pulmonary effort is normal. No respiratory distress.      Breath sounds: Normal breath sounds. No stridor. No wheezing or rhonchi.   Abdominal:      General: Abdomen is flat. Bowel sounds are normal. There is no distension.      Palpations: Abdomen is soft. There is no mass.      Tenderness: There is no abdominal tenderness. There is no right CVA tenderness, left CVA tenderness, guarding or rebound.      Hernia: No hernia is present.   Musculoskeletal:         General: No swelling, tenderness, deformity or signs of injury. Normal range of motion.      Cervical back: Normal range of motion and neck supple. No rigidity or tenderness.      Right lower leg: No edema.      Left lower leg: No edema.   Lymphadenopathy:      Cervical: No cervical adenopathy.   Skin:     General: Skin is warm and dry.      Capillary Refill: Capillary refill takes less than 2 seconds.   Neurological:      General: No focal deficit present.      Mental Status: She is alert and oriented to person, place, and time.      Cranial Nerves: No cranial nerve deficit.      Sensory: No sensory deficit.      Motor: Weakness present.      Coordination: Coordination normal.      Gait: Gait abnormal.      Deep Tendon Reflexes: Reflexes normal.   Psychiatric:         Mood and Affect: Mood normal.         Behavior: Behavior normal.         Thought Content: Thought content normal.           Labs:  No results found for this or any previous visit (from the past 24 hour(s)).         Imaging:           Microbiology:  No results found for any visits on 09/28/21 (from the past 96 hour(s)).        Assessment/ Plan:     Active Hospital Problems    Diagnosis   . Primary Problem: Debility   . Hip fracture (CMS HCC)   . Pelvic fracture (CMS HCC)   . Chronic obstructive pulmonary disease, unspecified COPD type (CMS HCC)   .  Moderate protein-calorie malnutrition (CMS HCC)   . HTN (hypertension)        Patient is doing well with debility status post fall on pelvic  fracture.  She is apparently ambulating with a walker.  She has chronic diffuse myalgias and arthralgias.  We will continue our present management clinic anticoagulation and physical therapy    Disposition Planning:   .  Pending improvement    Rex Kras, DO    This note was partially generated using MModal Fluency Direct system, and there may be some incorrect words, spellings, and punctuation that were not noted in checking the note before saving

## 2021-10-07 NOTE — Care Plan (Signed)
[facility]  Physical Therapy Progress Note    Patient Name: Theresa Greene  Date of Birth: 14-Nov-1934  Height:  167.6 cm (_0 )  Weight:  50.9 kg (112 lb 3.2 oz)    Room/Bed: 707/A  Payor: Payor: MEDICARE / Plan: MEDICARE PART A AND B / Product Type: Medicare /     Referring physician: Rex Kras, DO  Pertinent History of Current Functional Problem: 87 y.o. female s/p fall with subsequent R superior and inferior pubic rami fractures and nondisplaced subcapital femoral neck fracture previously fixated. Non operative treatment recommended with orders for WBAT on RLE. Pt transferred to swingbed program at [facility] for continued skilled rehabilitation services 09/28/21. 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    Start Time: 1412  End Time: 1441  Total Treatment Time: 29 minutes    Pt Start Position/alarms: Pt up in chair. No alarms. All needs in reach.  Pt End Position/alarms:  Pt up in chair and positioned for comfort with call light, phone and hydration in reach. All needs met. Daughter present.    Assessment:       Pt progressed to gait training 260 ft with FWW and SBA this session with improved posture and cadence with good weight acceptance onto RLE with step through gait pattern. Pt c/o pain rated at 3/10 in posterior aspect of R hip but pt reports improved comfort with lumbar compression brace donned for training. Pt completing lower extremity exercises with mild increased pain in groin and knee with gradually improving LLE strength noted. Ongoing training necessary to continue to make functional gains with bed mobility, transfers and stair negotiation with pt's goal to be more independent and be able to complete functional mobility, toileting and meal prep without caregiver assistance. Pt motivated to return to her PLOF and is actively  participating  in [place]. Pt reports if her husband is transferred to [facility] for rehabilitation services she also would like to transfer with him to complete her rehabilitation. She reports Dr. Graylon Good is aware of her requests.       Discharge Needs:   Equipment Recommendation: TBD    Discharge Disposition: Home with home health     Plan:   Continue to follow patient according to established plan of care.  The risks/benefits of therapy have been discussed with the patient/caregiver and he/she is in agreement with the established plan of care.     Subjective & Objective:     Pt agreeable to session with session completed per log with mild increased c/o pain in posterior aspect of R hip, R groin and in R knee. Pt reports improved comfort with lumbar compression brace donned for training. Pt up in chair with LE elevated and positioned for comfort with all needs in reach at end of session.      Sit to Stand: Supervision  Stand to Sit: Supervision    Gait Training  Ambulation Device: Conservation officer, nature  Ambulation Assist: Stand-by Assist  Ambulation Distance: 260 ft      Lower Extremity Exercises:    Sitting:  LAQ sets: x 20 BLE  SHF: x 20 BLE   Hip Adduction: x 20 BLE with pillow  Hip Abduction: x 20 BLE with therapist applied resistance   Ankle pumps: x 20 BLE    Long Sitting:   Heel slides:  x 20 BLE with MIN A on R  Quad sets: x 20 BLE  SAQ sets: x 20 BLE  Glute Sets: x 20      Physical Therapy Goals:   Bed Mobility Goal, Activity Type: supine to sit/sit to supine  Bed Mobility Goal, Independence Level: minimum assist (75% patient effort)   Bed Mobility Goal, Assistive Device: least restrictive assistive device    Transfer Training Goal, Activity Type: bed-to-chair/chair-to-bed, sit-to-stand/stand-to-sit  Transfer Training Goal, Independence Level: modified independence   Transfer Training Goal, Assist Device: walker, rolling    Gait Training  Goal, Independence Level: modified independence   Gait Training  Goal,  Assist Device: walker, rolling   Gait Training  Goal, Distance to Achieve: 150    Stairs Training Goal, Independence Level: contact guard assist   Stairs Training Goal, Assist Device: least restrictive assistive device   Stairs Training Goal, Number of Stairs to Achieve: 2      Therapist:   [name] DPT [phone]

## 2021-10-07 NOTE — Care Plan (Signed)
[facility]  Care Plan Note    Situation: swing bed debility    Intervention: physical therapy,nursing assessment and assist with adl's, meal set up, medications    Response: ongoing, patient is happy her spouse is sharing her room she wants them to go home together      Baird Cancer, RN

## 2021-10-07 NOTE — Nurses Notes (Signed)
Patient ambulated hallway with assist of 1 and a walker gait steady.

## 2021-10-08 NOTE — Nurses Notes (Signed)
Patient ambulated in hall with walker and x1 assist she is more steady on her feet.

## 2021-10-08 NOTE — Nurses Notes (Signed)
Patients daughter [name] is to bring in patients clothes and shoes tomorrow.

## 2021-10-08 NOTE — Care Plan (Signed)
[facility]  Care Plan Note    Situation: swing bed debility    Intervention: physical therapy,nursing assist and care,medications,meal set up    Response: ongoing,patient wants to go home      Baird Cancer, RN

## 2021-10-08 NOTE — Progress Notes (Signed)
[facility]  Progress Note    Theresa Greene  Date of service: 10/08/2021  Date of Admission:  09/28/2021  Hospital Day:  LOS: 10 days     Subjective:  Theresa Greene has no new complaints today.  She states she is comfortable and pain is managed.  She but denies shortness of breath, weakness, nausea, vomiting.      Review of Systems:    Review of Systems   Constitutional: Positive for malaise/fatigue. Negative for chills, diaphoresis, fever and weight loss.   HENT: Negative.  Negative for congestion, ear discharge, ear pain, hearing loss, nosebleeds, sinus pain, sore throat and tinnitus.    Eyes: Negative.    Respiratory: Negative for cough, hemoptysis, sputum production, shortness of breath, wheezing and stridor.    Cardiovascular: Negative.  Negative for chest pain, palpitations, orthopnea, claudication, leg swelling and PND.   Gastrointestinal: Negative for abdominal pain, blood in stool, constipation, diarrhea, heartburn, nausea and vomiting.   Genitourinary: Negative.  Negative for dysuria, flank pain, frequency, hematuria and urgency.   Musculoskeletal: Positive for back pain and myalgias. Negative for falls, joint pain and neck pain.   Skin: Negative.    Neurological: Positive for weakness. Negative for dizziness, tingling, tremors, sensory change, speech change, focal weakness, seizures, loss of consciousness and headaches.   Psychiatric/Behavioral: Negative for depression, hallucinations, memory loss, substance abuse and suicidal ideas. The patient is not nervous/anxious and does not have insomnia.          Objective:     Vital Signs:  Vitals:    10/06/21 1421 10/06/21 2200 10/07/21 0800 10/08/21 0800   BP:   (!) 164/80 (!) 171/75   Pulse:   69 68   Resp: 18 18 18 18    Temp:   36.7 C (98.1 F) 36.7 C (98 F)   SpO2:   92% 96%   Weight:       Height:       BMI:                I/O:  I/O last 24 hours:      Intake/Output Summary (Last 24 hours) at 10/08/2021 1329  Last data filed at 10/08/2021  1200  Gross per 24 hour   Intake 2100 ml   Output --   Net 2100 ml         acetaminophen (TYLENOL) tablet, 650 mg, Oral, Q6H PRN  amiodarone (CORDARONE) tablet, 100 mg, Oral, Daily  amLODIPine (NORVASC) tablet, 5 mg, Oral, Daily  atorvastatin (LIPITOR) tablet, 20 mg, Oral, Daily  clopidogrel (PLAVIX) 75 mg tablet, 75 mg, Oral, Daily  docusate sodium (COLACE) capsule, 100 mg, Oral, 2x/day  enoxaparin PF (LOVENOX) 30 mg/0.3 mL SubQ injection, 30 mg, Subcutaneous, Daily  ergocalciferol-vitamin D2 (DRISDOL) capsule, 50,000 Units, Oral, Q7 Days  ezetimibe (ZETIA) tablet, 10 mg, Oral, Daily  gabapentin (NEURONTIN) capsule, 100 mg, Oral, 2x/day  HYDROcodone-acetaminophen (NORCO) 5-325 mg per tablet, 1 Tablet, Oral, Q4H PRN  losartan (COZAAR) tablet, 50 mg, Oral, Daily  magnesium hydroxide (MILK OF MAGNESIA) 2,400 mg per 10 mL oral suspension, 10 mL, Oral, 3x/day PRN  melatonin tablet, 3 mg, Oral, NIGHTLY  melatonin tablet, 3 mg, Oral, HS PRN  metoprolol succinate (TOPROL-XL) 24 hr extended release tablet, 100 mg, Oral, Daily  ondansetron (ZOFRAN) 2 mg/mL injection, 4 mg, Intravenous, Q4H PRN  pancreatic enzyme replacement (CREON) 36,000 units lipase per cap, 1 Capsule, Oral, 2x/day-Food  pantoprazole (PROTONIX) delayed release tablet, 40 mg, Oral, Daily  Physical Exam:    Physical Exam  Vitals and nursing note reviewed.   Constitutional:       General: She is not in acute distress.     Appearance: Normal appearance.   HENT:      Head: Normocephalic and atraumatic.      Right Ear: Tympanic membrane normal.      Left Ear: Tympanic membrane normal.      Mouth/Throat:      Mouth: Mucous membranes are moist.   Eyes:      Extraocular Movements: Extraocular movements intact.      Pupils: Pupils are equal, round, and reactive to light.   Neck:      Vascular: No carotid bruit.   Cardiovascular:      Rate and Rhythm: Normal rate and regular rhythm.      Pulses: Normal pulses.      Heart sounds: Murmur heard.     No gallop.    Pulmonary:      Effort: Pulmonary effort is normal. No respiratory distress.      Breath sounds: Normal breath sounds. No stridor. No wheezing or rhonchi.   Abdominal:      General: Abdomen is flat. Bowel sounds are normal. There is no distension.      Palpations: Abdomen is soft. There is no mass.      Tenderness: There is no abdominal tenderness. There is no right CVA tenderness, left CVA tenderness, guarding or rebound.      Hernia: No hernia is present.   Musculoskeletal:         General: No swelling, tenderness, deformity or signs of injury. Normal range of motion.      Cervical back: Normal range of motion and neck supple. No rigidity or tenderness.      Right lower leg: No edema.      Left lower leg: No edema.   Lymphadenopathy:      Cervical: No cervical adenopathy.   Skin:     General: Skin is warm and dry.      Capillary Refill: Capillary refill takes less than 2 seconds.   Neurological:      General: No focal deficit present.      Mental Status: She is alert and oriented to person, place, and time.      Cranial Nerves: No cranial nerve deficit.      Sensory: No sensory deficit.      Motor: Weakness present.      Coordination: Coordination normal.      Gait: Gait abnormal.      Deep Tendon Reflexes: Reflexes normal.   Psychiatric:         Mood and Affect: Mood normal.         Behavior: Behavior normal.         Thought Content: Thought content normal.           Labs:  No results found for this or any previous visit (from the past 24 hour(s)).         Imaging:           Microbiology:  No results found for any visits on 09/28/21 (from the past 96 hour(s)).        Assessment/ Plan:     Active Hospital Problems    Diagnosis   . Primary Problem: Debility   . Hip fracture (CMS HCC)   . Pelvic fracture (CMS HCC)   . Chronic obstructive pulmonary disease, unspecified COPD type (CMS HCC)   .  Moderate protein-calorie malnutrition (CMS HCC)   . HTN (hypertension)        Patient is doing well with debility status  post fall with pelvic fracture.  She is apparently ambulating with a walker.  She has chronic diffuse myalgias and arthralgias, but is reasonably managed at this time.  We will continue our present management, anticoagulation and physical therapy    Disposition Planning:   .  Pending improvement    Rex Kras, DO    This note was partially generated using MModal Fluency Direct system, and there may be some incorrect words, spellings, and punctuation that were not noted in checking the note before saving

## 2021-10-09 ENCOUNTER — Other Ambulatory Visit (HOSPITAL_COMMUNITY)
Admit: 2021-10-09 | Discharge: 2021-10-09 | Disposition: A | Discharge status: Home / Self Care | Payer: Self-pay | Admitting: Internal Medicine

## 2021-10-09 LAB — COVID-19 ~~LOC~~ MOLECULAR LAB TESTING
INFLUENZA VIRUS TYPE A: NOT DETECTED
INFLUENZA VIRUS TYPE B: NOT DETECTED
RESPIRATORY SYNCTIAL VIRUS (RSV): NOT DETECTED
SARS-CoV-2: NOT DETECTED

## 2021-10-09 MED ORDER — HYDROCODONE 5 MG-ACETAMINOPHEN 325 MG TABLET
1.0000 | ORAL_TABLET | ORAL | 0 refills | Status: DC | PRN
Start: 2021-10-09 — End: 2021-10-27

## 2021-10-09 MED ORDER — DEXLANSOPRAZOLE 60 MG CAPSULE,BIPHASE DELAYED RELEASE
60.0000 mg | DELAYED_RELEASE_CAPSULE | Freq: Every day | ORAL | Status: DC
Start: 2021-10-10 — End: 2021-10-09

## 2021-10-09 MED ORDER — LOSARTAN 50 MG TABLET
100.0000 mg | ORAL_TABLET | Freq: Every day | ORAL | Status: DC
Start: 2021-10-10 — End: 2021-10-09

## 2021-10-09 NOTE — Discharge Instructions (Signed)
Take meds prescribed and follow up with PCP.

## 2021-10-09 NOTE — Care Plan (Signed)
[facility]  Physical Therapy Progress Note    Patient Name: Theresa Greene  Date of Birth: 1934/12/24  Height:  167.6 cm (5' 6" )  Weight:  50.9 kg (112 lb 3.2 oz)    Room/Bed: 707/A  Payor: Payor: MEDICARE / Plan: MEDICARE PART A AND B / Product Type: Medicare /     Referring physician: Rex Kras, DO  Pertinent History of Current Functional Problem: 87 y.o. female s/p fall with subsequent R superior and inferior pubic rami fractures and nondisplaced subcapital femoral neck fracture previously fixated. Non operative treatment recommended with orders for WBAT on RLE. Pt transferred to swingbed program at [facility] for continued skilled rehabilitation services 09/28/21. 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    Start Time: 0951  End Time: 1008  Total Treatment Time: 17 minutes    Pt Start Position/alarms: seated in chair with LE reclined/elevated, call light in reach, communication with nursing prior  Pt End Position/alarms:  Seated in chair with LE reclined/elevated, call light in reach, communication with PT staff Louellen Molder, PT) following        Assessment:       Today's session focused on improving bilateral LE strength to improve pt's ability to perform safely and with less difficulty transfers, ambulate, and perform ADLs. Only performed seated exercises for this session due to pt just receiving pain medication after speaking to pt's nurse Paige. Pt was able to complete all exercises without significant difficulty. Pt states she feels it in her back when performing gluteal sets, denies pain. Pt also occasionally reports feeling exercises in her knee, but denies pain when asked. In comparison to treatment session with [name], PT on 10/07/21, progressed repetitions for ankle pumps, gluteal sets, and hip adduction with pillow (see  previous treatment note). All needs met at end of session with no signs of physical distress. Pt's family member present in room for portion of the session and at the end of the treatment session.       Discharge Needs:   Equipment Recommendation: TBD    Discharge Disposition: To be determined    Plan:   Continue to follow patient according to established plan of care.  The risks/benefits of therapy have been discussed with the patient/caregiver and he/she is in agreement with the established plan of care.     Subjective & Objective:     Pt is agreeable to treatment session. Pt states when she was up and walking this morning her pain in her back was 6/10 pain on the NPRS. Pt reports that she did not sleep well last night and is tired.   Pt identified by name and wrist ID band. Seated therapeutic exercises to improve bilateral LE strength to improve pt's ability to safely and with less difficulty perform transfers, ambulate, and perform ADLs.     Lower Extremity Exercises:  Long seated in chair:   Ankle Pumps x30   Glute Sets: x30  SAQ: 2x10 each LE     Seated:   LAQ: 2x10 each LE   Hip Abduction with manual resistance x20 each LE   Hip Adduction with pillow: x25             Physical Therapy Goals:   Bed Mobility Goal, Activity Type: supine to sit/sit to supine  Bed Mobility Goal, Independence Level:  minimum assist (75% patient effort)   Bed Mobility Goal, Assistive Device: least restrictive assistive device    Transfer Training Goal, Activity Type: bed-to-chair/chair-to-bed, sit-to-stand/stand-to-sit  Transfer Training Goal, Independence Level: modified independence   Transfer Training Goal, Assist Device: walker, rolling    Gait Training  Goal, Independence Level: modified independence   Gait Training  Goal, Assist Device: walker, rolling   Gait Training  Goal, Distance to Achieve: 150    Stairs Training Goal, Independence Level: contact guard assist   Stairs Training Goal, Assist Device: least restrictive  assistive device   Stairs Training Goal, Number of Stairs to Achieve: 2      Therapist:   Charolett Bumpers, [id number]

## 2021-10-09 NOTE — Discharge Summary (Signed)
[facility]  DISCHARGE SUMMARY    PATIENT NAME:  Theresa Greene, Theresa Greene  MRN:  [id number]  DOB:  1935-06-10    ENCOUNTER DATE:  09/28/2021  INPATIENT ADMISSION DATE: 09/28/2021  DISCHARGE DATE:  10/09/2021    ATTENDING PHYSICIAN: Rex Kras, DO  SERVICE: Thornton Papas  PRIMARY CARE PHYSICIAN: Bosie Clos, DO         LAY CAREGIVER:  ,  ,        PRIMARY DISCHARGE DIAGNOSIS: [place] [facility] Problems    Diagnosis Date Noted   . Principal Problem: Debility [R53.81] 09/25/2021   . Hip fracture (CMS [place]) [S72.009A] 09/25/2021   . Pelvic fracture (CMS HCC) [S32.9XXA] 09/25/2021   . Chronic obstructive pulmonary disease, unspecified COPD type (CMS [place]) [J44.9] 07/20/2021   . Moderate protein-calorie malnutrition (CMS HCC) [E44.0] 07/20/2021   . HTN (hypertension) [I10] 01/13/2015      Resolved Hospital Problems   No resolved problems to display.     Active Non-Hospital Problems    Diagnosis Date Noted   . Failure to thrive (child) 07/20/2021   . Closed fracture of distal end of right femur (CMS [place]) 07/20/2021   . Osteoporosis 08/25/2020   . Iron deficiency anemia 07/19/2020   . Neuropathy (CMS HCC) 04/05/2020   . Vitamin D deficiency 12/25/2019   . Stage 3 chronic kidney disease (CMS [place]) 12/25/2019   . Hyperlipidemia 12/25/2019   . Cardiac pacemaker in situ 01/10/2017   . Rheumatoid arthritis (CMS [place]) 08/06/2014   . PUD (peptic ulcer disease) 08/06/2014   . History of CVA (cerebrovascular accident) 08/06/2014   . History of cardiac pacemaker 08/06/2014   . Gastroesophageal reflux disease 08/06/2014   . Charcot's joint of foot, left 08/06/2014   . CAD S/P percutaneous coronary angioplasty 08/06/2014   . Paroxysmal atrial fibrillation (CMS HCC) 08/06/2014        DISCHARGE MEDICATIONS:     Current Discharge Medication List      START taking these medications.      Details   HYDROcodone-acetaminophen 5-325 mg Tablet  Commonly known as: NORCO   1 Tablet, Oral, EVERY 4 HOURS PRN  Qty: 6 Tablet  Refills:  0        CONTINUE these medications - NO CHANGES were made during your visit.      Details   amiodarone 200 mg Tablet  Commonly known as: PACERONE   100 mg, Oral, DAILY  Qty: 45 Tablet  Refills: 3     amLODIPine 5 mg Tablet  Commonly known as: NORVASC   5 mg, Oral, DAILY  Qty: 90 Tablet  Refills: 4     atorvastatin 20 mg Tablet  Commonly known as: LIPITOR   20 mg, Oral, DAILY  Qty: 90 Tablet  Refills: 3     clopidogreL 75 mg Tablet  Commonly known as: PLAVIX   75 mg, Oral, DAILY  Qty: 90 Tablet  Refills: 3     Creon 36,000-114,000- 180,000 unit Capsule, Delayed Release(E.C.)  Generic drug: lipase-protease-amylase   2 Capsules, 3 TIMES DAILY WITH MEALS, Additional two capsules twice with largest snacks of the day. (Five times a day total)  Refills: 0     Dexlansoprazole 60 mg Cap, Delayed Rel., Multiphasic  Commonly known as: DEXILANT   60 mg, Oral, DAILY  Refills: 0     ergocalciferol (vitamin D2) 1,250 mcg (50,000 unit) Capsule  Commonly known as: DRISDOL   50,000 Units, Oral, EVERY 7 DAYS, Takes on Fridays  Refills: 0     ezetimibe 10 mg Tablet  Commonly known as: ZETIA   10 mg, Oral, DAILY  Qty: 90 Tablet  Refills: 3     gabapentin 100 mg Capsule  Commonly known as: NEURONTIN   100 mg, Oral, 2 TIMES DAILY  Qty: 180 Capsule  Refills: 1     losartan 50 mg Tablet  Commonly known as: COZAAR   25 mg, Oral, 2 TIMES DAILY  Qty: 90 Tablet  Refills: 3     metoprolol succinate 100 mg Tablet Sustained Release 24 hr  Commonly known as: TOPROL-XL   100 mg, Oral, DAILY  Qty: 90 Tablet  Refills: 4        STOP taking these medications.    gemfibroziL 600 mg Tablet  Commonly known as: LOPID     metoprolol tartrate 25 mg Tablet  Commonly known as: LOPRESSOR     sodium bicarbonate 650 mg Tablet          Discharge med list refreshed?  YES                     ALLERGIES:  Allergies   Allergen Reactions   . Iodinated Contrast Media Anaphylaxis   . Iv Contrast Anaphylaxis     makese her serverly sick   . Etodolac NO Steroids unless  approved by Attending Physician   . Symbicort [Budesonide-Formoterol]    . Codeine  Other Adverse Reaction (Add comment) and Nausea/ Vomiting     Stomach pain  Stomach pain               HOSPITAL PROCEDURE(S):   Bedside Procedures:  No orders of the defined types were placed in this encounter.    Surgical       REASON FOR HOSPITALIZATION AND HOSPITAL COURSE     BRIEF HPI:  This is a 87 y.o., female admitted for acute ambulatory dysfunction after a fall.  She was found to have a pelvic fracture as well as what appeared to be a periprosthetic fracture to her right hip region.    BRIEF HOSPITAL NARRATIVE:         Hospital course unremarkable.  Physical therapy is consulted.  She did see Dr. Owens Shark who recommend no surgical intervention with weight-bearing as tolerated.  She was continued on her home medications and glucose was monitored.  Patient actually did fairly well.  Although she has chronic pain she did very well with physical therapy and had no problems or complications.  Labs and vitals remained stable.  No complications were noted.  Arrangements were made for the patient be transferred to [facility] for further rehabilitation physical therapy.  She will be transferred in stable satisfactory condition without complication.     TRANSITION/POST DISCHARGE CARE/PENDING TESTS/REFERRALS:  She will be transferred to [facility] for further physical therapy/rehabilitation in stable condition.  Medications are per the discharge reconciliation with follow-up upon discharge with her provider.        CONDITION ON DISCHARGE:  A. Ambulation: Ambulation with assistive device  B. Self-care Ability: Complete  C. Cognitive Status Alert  D. Code status at discharge:             LINES/DRAINS/WOUNDS AT DISCHARGE:   Patient Lines/Drains/Airways Status     Active Line / Dialysis Catheter / Dialysis Graft / Drain / Airway / Wound     None                DISCHARGE DISPOSITION:  Rehab  Hospital              DISCHARGE  INSTRUCTIONS:  Post-Discharge Follow Up Appointments     Wednesday Oct 25, 2021    Return Patient Visit with Einar Gip, MD at 12:30 PM    Friday Oct 27, 2021    Return Patient Visit with Gerrie Nordmann, MD at  9:45 AM    Cardiology [place], [place]   [place] [address]  [phone] [facility]  [facility]  [address]  [place] [address]  [phone]        No discharge procedures on file.             Rex Kras, DO    Copies sent to Care Team       Relationship Specialty Notifications Start End    [place], [place], [place] PCP - General INTERNAL MEDICINE Admissions 04/05/20     Phone: [phone] Fax: [phone]         [place] [address]            Referring providers can utilize [email or web address] to access their referred [place] patient's information.

## 2021-10-09 NOTE — Care Plan (Addendum)
[place]  Physical Therapy Progress Note    Patient Name: Theresa Greene  Date of Birth: Aug 25, 1934  Height:  167.6 cm (5' 6" )  Weight:  50.9 kg (112 lb 3.2 oz)    Room/Bed: 707/A  Payor: Payor: MEDICARE / Plan: MEDICARE PART A AND B / Product Type: Medicare /     Referring physician: Rex Kras, DO  Pertinent History of Current Functional Problem: 88 y.o. female s/p fall with subsequent R superior and inferior pubic rami fractures and nondisplaced subcapital femoral neck fracture previously fixated. Non operative treatment recommended with orders for WBAT on RLE. Pt transferred to swingbed program at [facility] for continued skilled rehabilitation services 09/28/21. 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    Start Time: 1013  End Time: 1043  Total Treatment Time: 30 minutes    Pt Start Position/alarms: White female seated in reclined bedside chair on room air in no distress. Patient daughter and husband present at time of PT session.   Pt End Position/alarms: Patient left seated in reclined bedside chair with all needs met, call light and phone in reach, tray table at bedside.        Assessment:         Patient tolerated gait training in socks well this AM, however, did not tolerate ambulation with shoes donned due to feeling like R shoe increased pressure on R pelvis causing pain. Patient daughter reiterated concerns regarding patient inability to dress herself. PT observed and provided MinA for lower body dressing. Patient states she is unable to donn socks or shoes at this time. Discussed possibility of d/c to [facility] to be with husband. Patient will benefit from further PT for mobility/gait training and OT services could also benefit patient to address ADL independence.      Discharge Needs:   Equipment Recommendation:  TBD    Discharge Disposition: To be determined    Plan:   Continue to follow patient according to established plan of care.  The risks/benefits of therapy have been discussed with the patient/caregiver and he/she is in agreement with the established plan of care.     Subjective & Objective:     RN Arby Barrette approved PT session. Most recent pain medication has been administered. Patient eager to ambulate. Patient daughter reiterated concerns regarding patient inability to dress herself as husband typically assists patient. Patient husband hospitalized as well.  PT observed and provided MinA for lower body dressing. Patient states she is unable to donn socks or shoes at this time. Discussed possibility of d/c to [facility] to be with husband. Patient will benefit from further PT for mobility/gait training and OT services could also benefit patient to address ADL independence.    Pain: pain increased R posterior hip/pelvis area during ambulation with shoes, unrated    Sit to Stand: Contact Guard  Stand to Sit: Biomedical scientist  Ambulation Device: Conservation officer, nature  Ambulation Assist: Health and safety inspector Distance: shoes donned 18 feet, without shoes (in hospital socks) 140 feet   Patient ambulated 18 feet CGA with [employer], shoes donned. Distance limited by R posterior hip/pelvic pain. Slow antalgic gait pattern noted.  Patient also ambulated 140 feet CGA with WW, hospital socks donned no shoes. Improved stride length and no antalgic pattern. Exaggerated RLE toe out.  Physical Therapy Goals:   Bed Mobility Goal, Activity Type: supine to sit/sit to supine  Bed Mobility Goal, Independence Level: minimum assist (75% patient effort)   Bed Mobility Goal, Assistive Device: least restrictive assistive device    Transfer Training Goal, Activity Type: bed-to-chair/chair-to-bed, sit-to-stand/stand-to-sit  Transfer Training Goal, Independence Level: modified independence   Transfer Training Goal, Assist Device:  walker, rolling    Gait Training  Goal, Independence Level: modified independence   Gait Training  Goal, Assist Device: walker, rolling   Gait Training  Goal, Distance to Achieve: 150    Stairs Training Goal, Independence Level: contact guard assist   Stairs Training Goal, Assist Device: least restrictive assistive device   Stairs Training Goal, Number of Stairs to Achieve: 2      Therapist:   Louellen Molder, PT, DPT, [phone]

## 2021-10-09 NOTE — Care Management Notes (Signed)
Pt voiced desire to get continued rehab at [facility]. New referral given to Huntley Dec at [facility], who stated she will come and evaluate pt today.

## 2021-10-09 NOTE — Nurses Notes (Signed)
Called report and they stated have to have Covid test in past 3 days. Waiting on Covid test results.

## 2021-10-09 NOTE — Care Plan (Signed)
[facility]  Care Plan Note    Situation: admitted for debility     Intervention: pain control, PT, nursing care    Response: working well with PT, pain controlled       Marica Otter, RN    Problem: Health Knowledge, Opportunity to Enhance (Adult,Obstetrics,Pediatric)  Goal: Knowledgeable about Health Subject/Topic  Description: Patient will demonstrate the desired outcomes by discharge/transition of care.  Outcome: Ongoing (see interventions/notes)     Problem: Adult Inpatient Plan of Care  Goal: Plan of Care Review  Outcome: Ongoing (see interventions/notes)  Goal: Patient-Specific Goal (Individualized)  Outcome: Ongoing (see interventions/notes)  Goal: Absence of Hospital-Acquired Illness or Injury  Outcome: Ongoing (see interventions/notes)  Intervention: Prevent Skin Injury  Recent Flowsheet Documentation  Taken 10/09/2021 0800 by Marica Otter, RN  Skin Protection: adhesive use limited  Intervention: Prevent and Manage VTE (Venous Thromboembolism) Risk  Recent Flowsheet Documentation  Taken 10/09/2021 0800 by Marica Otter, RN  VTE Prevention/Management:   anticoagulant therapy maintained   ambulation promoted  Goal: Optimal Comfort and Wellbeing  Outcome: Ongoing (see interventions/notes)  Goal: Rounds/Family Conference  Outcome: Ongoing (see interventions/notes)     Problem: Discharge Needs Assessment  Goal: Discharge Needs Assessment  Outcome: Ongoing (see interventions/notes)     Problem: Pain Acute  Goal: Optimal Pain Control and Function  Outcome: Ongoing (see interventions/notes)     Problem: Fall Injury Risk  Goal: Absence of Fall and Fall-Related Injury  Outcome: Ongoing (see interventions/notes)     Problem: Skin Injury Risk Increased  Goal: Skin Health and Integrity  Outcome: Ongoing (see interventions/notes)  Intervention: Optimize Skin Protection  Recent Flowsheet Documentation  Taken 10/09/2021 0800 by Marica Otter, RN  Pressure Reduction Techniques:   heels elevated off bed    (L,M,H,VH)Frequent Turning  Skin Protection: adhesive use limited

## 2021-10-09 NOTE — Nurses Notes (Signed)
Pt. Left via private vehicle with daughter. Verbalized understanding of discharge instructions.

## 2021-10-09 NOTE — Care Management Notes (Signed)
Theresa Greene from [facility] evaluated pt & stated they can accept pt to their facility today after 6pm. Pt voiced agreement with this DC plan. Pt's daughter, [name], present and stated she plans to drive pt to [facility] today. Phoned Dr Crissie Figures office & requested that [facility] inform Dr Graylon Good re: [facility] accepting pt to their facility today.

## 2021-10-09 NOTE — Progress Notes (Signed)
[facility]  Progress Note    Theresa Greene  Date of service: 10/09/2021  Date of Admission:  09/28/2021  Hospital Day:  LOS: 11 days     Subjective:  Theresa Greene has no new complaints today.  She states she is comfortable and pain is managed.  She but denies shortness of breath, weakness, nausea, vomiting.  She has chronic back tenderness.  Blood pressure is somewhat elevated I will adjust her medications    Review of Systems:    Review of Systems   Constitutional: Positive for malaise/fatigue. Negative for chills, diaphoresis, fever and weight loss.   HENT: Negative.  Negative for congestion, ear discharge, ear pain, hearing loss, nosebleeds, sinus pain, sore throat and tinnitus.    Eyes: Negative.    Respiratory: Negative for cough, hemoptysis, sputum production, shortness of breath, wheezing and stridor.    Cardiovascular: Negative.  Negative for chest pain, palpitations, orthopnea, claudication, leg swelling and PND.   Gastrointestinal: Negative for abdominal pain, blood in stool, constipation, diarrhea, heartburn, nausea and vomiting.   Genitourinary: Negative.  Negative for dysuria, flank pain, frequency, hematuria and urgency.   Musculoskeletal: Positive for back pain and myalgias. Negative for falls, joint pain and neck pain.   Skin: Negative.    Neurological: Positive for weakness. Negative for dizziness, tingling, tremors, sensory change, speech change, focal weakness, seizures, loss of consciousness and headaches.   Psychiatric/Behavioral: Negative for depression, hallucinations, memory loss, substance abuse and suicidal ideas. The patient is not nervous/anxious and does not have insomnia.          Objective:     Vital Signs:  Vitals:    10/07/21 0800 10/08/21 0800 10/08/21 2046 10/09/21 0800   BP: (!) 164/80 (!) 171/75  (!) 168/70   Pulse: 69 68  70   Resp: 18 18 18     Temp: 36.7 C (98.1 F) 36.7 C (98 F)  37.8 C (100.1 F)   SpO2: 92% 96%  96%   Weight:       Height:       BMI:                 I/O:  I/O last 24 hours:      Intake/Output Summary (Last 24 hours) at 10/09/2021 0931  Last data filed at 10/09/2021 0600  Gross per 24 hour   Intake 1040 ml   Output --   Net 1040 ml         acetaminophen (TYLENOL) tablet, 650 mg, Oral, Q6H PRN  amiodarone (CORDARONE) tablet, 100 mg, Oral, Daily  amLODIPine (NORVASC) tablet, 5 mg, Oral, Daily  atorvastatin (LIPITOR) tablet, 20 mg, Oral, Daily  clopidogrel (PLAVIX) 75 mg tablet, 75 mg, Oral, Daily  docusate sodium (COLACE) capsule, 100 mg, Oral, 2x/day  enoxaparin PF (LOVENOX) 30 mg/0.3 mL SubQ injection, 30 mg, Subcutaneous, Daily  ergocalciferol-vitamin D2 (DRISDOL) capsule, 50,000 Units, Oral, Q7 Days  ezetimibe (ZETIA) tablet, 10 mg, Oral, Daily  gabapentin (NEURONTIN) capsule, 100 mg, Oral, 2x/day  HYDROcodone-acetaminophen (NORCO) 5-325 mg per tablet, 1 Tablet, Oral, Q4H PRN  losartan (COZAAR) tablet, 50 mg, Oral, Daily  magnesium hydroxide (MILK OF MAGNESIA) 2,400 mg per 10 mL oral suspension, 10 mL, Oral, 3x/day PRN  melatonin tablet, 3 mg, Oral, NIGHTLY  melatonin tablet, 3 mg, Oral, HS PRN  metoprolol succinate (TOPROL-XL) 24 hr extended release tablet, 100 mg, Oral, Daily  ondansetron (ZOFRAN) 2 mg/mL injection, 4 mg, Intravenous, Q4H PRN  pancreatic enzyme replacement (CREON) 36,000 units lipase  per cap, 1 Capsule, Oral, 2x/day-Food  pantoprazole (PROTONIX) delayed release tablet, 40 mg, Oral, Daily        Physical Exam:    Physical Exam  Vitals and nursing note reviewed.   Constitutional:       General: She is not in acute distress.     Appearance: Normal appearance.   HENT:      Head: Normocephalic and atraumatic.      Right Ear: Tympanic membrane normal.      Left Ear: Tympanic membrane normal.      Mouth/Throat:      Mouth: Mucous membranes are moist.   Eyes:      Extraocular Movements: Extraocular movements intact.      Pupils: Pupils are equal, round, and reactive to light.   Neck:      Vascular: No carotid bruit.   Cardiovascular:       Rate and Rhythm: Normal rate and regular rhythm.      Pulses: Normal pulses.      Heart sounds: Murmur heard.     No gallop.   Pulmonary:      Effort: Pulmonary effort is normal. No respiratory distress.      Breath sounds: Normal breath sounds. No stridor. No wheezing or rhonchi.   Abdominal:      General: Abdomen is flat. Bowel sounds are normal. There is no distension.      Palpations: Abdomen is soft. There is no mass.      Tenderness: There is no abdominal tenderness. There is no right CVA tenderness, left CVA tenderness, guarding or rebound.      Hernia: No hernia is present.   Musculoskeletal:         General: No swelling, tenderness, deformity or signs of injury. Normal range of motion.      Cervical back: Normal range of motion and neck supple. No rigidity or tenderness.      Right lower leg: No edema.      Left lower leg: No edema.   Lymphadenopathy:      Cervical: No cervical adenopathy.   Skin:     General: Skin is warm and dry.      Capillary Refill: Capillary refill takes less than 2 seconds.   Neurological:      General: No focal deficit present.      Mental Status: She is alert and oriented to person, place, and time.      Cranial Nerves: No cranial nerve deficit.      Sensory: No sensory deficit.      Motor: Weakness present.      Coordination: Coordination normal.      Gait: Gait abnormal.      Deep Tendon Reflexes: Reflexes normal.   Psychiatric:         Mood and Affect: Mood normal.         Behavior: Behavior normal.         Thought Content: Thought content normal.           Labs:  No results found for this or any previous visit (from the past 24 hour(s)).         Imaging:           Microbiology:  No results found for any visits on 09/28/21 (from the past 96 hour(s)).        Assessment/ Plan:     Active Hospital Problems    Diagnosis   . Primary Problem: Debility   . Hip fracture (  CMS [place])   . Pelvic fracture (CMS HCC)   . Chronic obstructive pulmonary disease, unspecified COPD type (CMS HCC)   .  Moderate protein-calorie malnutrition (CMS HCC)   . HTN (hypertension)        Patient is doing well with debility status post fall with pelvic fracture.  She is apparently ambulating with a walker.  She has chronic diffuse myalgias and arthralgias, but is reasonably managed at this time.  We will continue our present management, anticoagulation and physical therapy.      Disposition Planning:   .  Pending improvement    Rex Kras, DO    This note was partially generated using MModal Fluency Direct system, and there may be some incorrect words, spellings, and punctuation that were not noted in checking the note before saving

## 2021-10-10 ENCOUNTER — Other Ambulatory Visit (HOSPITAL_COMMUNITY): Payer: Self-pay | Admitting: Family Medicine

## 2021-10-10 LAB — CBC WITH DIFF
BASOPHIL #: <0.1 10*3/uL (ref <=0.20)
BASOPHIL %: 1 %
EOSINOPHIL #: 0.4 10*3/uL (ref <=0.50)
EOSINOPHIL %: 4 %
HCT: 31.2 % — ABNORMAL LOW (ref 34.8–46.0)
HGB: 9.7 g/dL — ABNORMAL LOW (ref 11.5–16.0)
IMMATURE GRANULOCYTE #: <0.1 10*3/uL (ref <0.10)
IMMATURE GRANULOCYTE %: 1 % (ref 0–1)
LYMPHOCYTE #: 2.39 10*3/uL (ref 1.00–4.80)
LYMPHOCYTE %: 24 %
MCH: 30.2 pg (ref 26.0–32.0)
MCHC: 31.1 g/dL (ref 31.0–35.5)
MCV: 97.2 fL (ref 78.0–100.0)
MONOCYTE #: 1.21 10*3/uL — ABNORMAL HIGH (ref 0.20–1.10)
MONOCYTE %: 12 %
MPV: 9.8 fL (ref 8.7–12.5)
NEUTROPHIL #: 5.75 10*3/uL (ref 1.50–7.70)
NEUTROPHIL %: 58 %
PLATELETS: 555 10*3/uL — ABNORMAL HIGH (ref 150–400)
RBC: 3.21 10*6/uL — ABNORMAL LOW (ref 3.85–5.22)
RDW-CV: 15.3 % (ref 11.5–15.5)
WBC: 9.9 10*3/uL (ref 3.7–11.0)

## 2021-10-10 LAB — COMPREHENSIVE METABOLIC PANEL, NON-FASTING
ALBUMIN: 2.7 g/dL — ABNORMAL LOW (ref 3.4–4.8)
ALKALINE PHOSPHATASE: 244 U/L — ABNORMAL HIGH (ref 55–145)
ALT (SGPT): 62 U/L — ABNORMAL HIGH (ref 8–22)
ANION GAP: 9 mmol/L (ref 4–13)
AST (SGOT): 56 U/L — ABNORMAL HIGH (ref 8–45)
BILIRUBIN TOTAL: 0.7 mg/dL (ref 0.3–1.3)
BUN/CREA RATIO: 28 — ABNORMAL HIGH (ref 6–22)
BUN: 29 mg/dL — ABNORMAL HIGH (ref 8–25)
CALCIUM: 8.5 mg/dL — ABNORMAL LOW (ref 8.8–10.2)
CHLORIDE: 111 mmol/L (ref 96–111)
CO2 TOTAL: 21 mmol/L — ABNORMAL LOW (ref 23–31)
CREATININE: 1.04 mg/dL (ref 0.60–1.05)
ESTIMATED GFR: 53 mL/min/BSA — ABNORMAL LOW (ref >=60)
GLUCOSE: 80 mg/dL (ref 65–125)
POTASSIUM: 4.8 mmol/L (ref 3.5–5.1)
PROTEIN TOTAL: 5.1 g/dL — ABNORMAL LOW (ref 6.0–8.0)
SODIUM: 141 mmol/L (ref 136–145)

## 2021-10-10 LAB — CBC/DIFF - CLIENT CONSOLIDATED
BASOPHIL #: <0.1 10*3/uL (ref <=0.20)
BASOPHIL %: 1 %
EOSINOPHIL #: 0.4 10*3/uL (ref <=0.50)
EOSINOPHIL %: 4 %
HCT: 31.2 % — ABNORMAL LOW (ref 34.8–46.0)
HGB: 9.7 g/dL — ABNORMAL LOW (ref 11.5–16.0)
IMMATURE GRANULOCYTE #: <0.1 10*3/uL (ref <0.10)
IMMATURE GRANULOCYTE %: 1 % (ref 0–1)
LYMPHOCYTE #: 2.39 10*3/uL (ref 1.00–4.80)
LYMPHOCYTE %: 24 %
MCH: 30.2 pg (ref 26.0–32.0)
MCHC: 31.1 g/dL (ref 31.0–35.5)
MCV: 97.2 fL (ref 78.0–100.0)
MONOCYTE #: 1.21 10*3/uL — ABNORMAL HIGH (ref 0.20–1.10)
MONOCYTE %: 12 %
MPV: 9.8 fL (ref 8.7–12.5)
NEUTROPHIL #: 5.75 10*3/uL (ref 1.50–7.70)
NEUTROPHIL %: 58 %
PLATELETS: 555 10*3/uL — ABNORMAL HIGH (ref 150–400)
RBC: 3.21 10*6/uL — ABNORMAL LOW (ref 3.85–5.22)
RDW-CV: 15.3 % (ref 11.5–15.5)
WBC: 9.9 10*3/uL (ref 3.7–11.0)

## 2021-10-10 NOTE — Care Plan (Signed)
Pt actively participated in skilled PT POC as part of the swing bed program at [facility] with progression to completing supine to sit at a MOD I level, sit to supine with MIN A >CGA, sit to stand with supervision, bed to chair with FWW with supervision, ambulation 140 ft to 260 ft with FWW with supervision, and stair negotiation x 2 with CGA as a result of skilled PT services. Pt limited due to pain in posterior aspect of R hip, groin and knee but was tolerating sessions with coordination of pain medication and utilizing of lumbar compression brace. The pt requested transfer to [facility] 10/09/21 for continued skilled rehabilitation services due to her husband going to the facility for rehabilitation following CVA. Pt making good progress with POC prior to discharge but was unable to reach set goals due to requested transfer prior to completing POC. Discharge skilled PT services 10/09/21.     [name] DPT [phone]

## 2021-10-12 ENCOUNTER — Other Ambulatory Visit (HOSPITAL_COMMUNITY): Payer: Self-pay | Admitting: Family Medicine

## 2021-10-12 LAB — CBC/DIFF - CLIENT CONSOLIDATED
BASOPHIL #: <0.1 10*3/uL (ref <=0.20)
BASOPHIL %: 1 %
EOSINOPHIL #: 0.34 10*3/uL (ref <=0.50)
EOSINOPHIL %: 4 %
HCT: 31.7 % — ABNORMAL LOW (ref 34.8–46.0)
HGB: 10 g/dL — ABNORMAL LOW (ref 11.5–16.0)
IMMATURE GRANULOCYTE #: <0.1 10*3/uL (ref <0.10)
IMMATURE GRANULOCYTE %: 1 % (ref 0–1)
LYMPHOCYTE #: 2.05 10*3/uL (ref 1.00–4.80)
LYMPHOCYTE %: 25 %
MCH: 30.8 pg (ref 26.0–32.0)
MCHC: 31.5 g/dL (ref 31.0–35.5)
MCV: 97.5 fL (ref 78.0–100.0)
MONOCYTE #: 1.05 10*3/uL (ref 0.20–1.10)
MONOCYTE %: 13 %
MPV: 9.9 fL (ref 8.7–12.5)
NEUTROPHIL #: 4.59 10*3/uL (ref 1.50–7.70)
NEUTROPHIL %: 56 %
PLATELETS: 534 10*3/uL — ABNORMAL HIGH (ref 150–400)
RBC: 3.25 10*6/uL — ABNORMAL LOW (ref 3.85–5.22)
RDW-CV: 15.5 % (ref 11.5–15.5)
WBC: 8.1 10*3/uL (ref 3.7–11.0)

## 2021-10-12 LAB — CBC WITH DIFF
BASOPHIL #: <0.1 10*3/uL (ref <=0.20)
BASOPHIL %: 1 %
EOSINOPHIL #: 0.34 10*3/uL (ref <=0.50)
EOSINOPHIL %: 4 %
HCT: 31.7 % — ABNORMAL LOW (ref 34.8–46.0)
HGB: 10 g/dL — ABNORMAL LOW (ref 11.5–16.0)
IMMATURE GRANULOCYTE #: <0.1 10*3/uL (ref <0.10)
IMMATURE GRANULOCYTE %: 1 % (ref 0–1)
LYMPHOCYTE #: 2.05 10*3/uL (ref 1.00–4.80)
LYMPHOCYTE %: 25 %
MCH: 30.8 pg (ref 26.0–32.0)
MCHC: 31.5 g/dL (ref 31.0–35.5)
MCV: 97.5 fL (ref 78.0–100.0)
MONOCYTE #: 1.05 10*3/uL (ref 0.20–1.10)
MONOCYTE %: 13 %
MPV: 9.9 fL (ref 8.7–12.5)
NEUTROPHIL #: 4.59 10*3/uL (ref 1.50–7.70)
NEUTROPHIL %: 56 %
PLATELETS: 534 10*3/uL — ABNORMAL HIGH (ref 150–400)
RBC: 3.25 10*6/uL — ABNORMAL LOW (ref 3.85–5.22)
RDW-CV: 15.5 % (ref 11.5–15.5)
WBC: 8.1 10*3/uL (ref 3.7–11.0)

## 2021-10-12 LAB — BASIC METABOLIC PANEL, FASTING
ANION GAP: 9 mmol/L (ref 4–13)
BUN/CREA RATIO: 29 — ABNORMAL HIGH (ref 6–22)
BUN: 30 mg/dL — ABNORMAL HIGH (ref 8–25)
CALCIUM: 7.9 mg/dL — ABNORMAL LOW (ref 8.8–10.2)
CHLORIDE: 109 mmol/L (ref 96–111)
CO2 TOTAL: 22 mmol/L — ABNORMAL LOW (ref 23–31)
CREATININE: 1.03 mg/dL (ref 0.60–1.05)
ESTIMATED GFR: 54 mL/min/BSA — ABNORMAL LOW (ref >=60)
GLUCOSE: 83 mg/dL (ref 70–99)
POTASSIUM: 4.8 mmol/L (ref 3.5–5.1)
SODIUM: 140 mmol/L (ref 136–145)

## 2021-10-13 ENCOUNTER — Telehealth (INDEPENDENT_AMBULATORY_CARE_PROVIDER_SITE_OTHER): Payer: Self-pay | Admitting: Internal Medicine

## 2021-10-13 NOTE — Telephone Encounter (Signed)
Transition of Care Contact Information  Discharge Date: 09/28/2021  Transition Facility Type--Hospital (Inpatient or Observation)  Facility [facility]  Interactive Contact(s): Completed or attempted contact indicated by Date/Time  Completed Contact: 10/13/2021  2:35 PM  Contact Method(s)-- Patient/Caregiver Telephone  Clinical Staff Name/Role who Wardell Honour, LPN  Transition Assessment  Discharge Summary obtained?--Yes  How are you recovering?--Improving  Discharge Meds obtained?--Yes  Medications reconcilled with Discharge Documentation?--Yes  Medication understanding --knows new medication(s)--knows purpose of medication  Medication Concerns?--No  Have everything needed for recovery?--Yes  Care Coordination:   Patient has transition follow-up appointment date and time?--No  Primary Care Transition Visit planned?--Yes  Specialist Transition Visit planned?--Yes  Patient/caregiver plans to attend transition visit?--No  Primary Follow-up [facility] or DME ordered at discharge?--No  Clinician/Team notified?--No  Primary reason clinician notified?  Transition Note:   I spoke with Theresa Greene. Theresa Greene is currently admitted at [facility] and they are hoping she will be discharged around the 9th of march. She also discussed with me about most recent appt. With the rheumatologist Dr. Joelene Millin, they advised her that Theresa Greene's test results shows she does not have RA but that she does have osteoporosis. Ill call later in the month for a follow up appt. With Dr. Jeanice Lim       Further information may be documented in relevant telephone or outreach encounter.

## 2021-10-13 NOTE — Telephone Encounter (Signed)
I spoke with Theresa Greene daughter. Theresa Greene is currently admitted at [facility] and they are hoping she will be discharged around the 9th of march. She also discussed with me about most recent appt. With the rheumatologist Dr. Joelene Millin, they advised her that [name]'s test results shows she does not have RA but that she does have osteoporosis. Ill call later in the month for a follow up appt. With Dr. Hillis Range Katrice Goel, LPN  04/14/3413, 43:60

## 2021-10-14 ENCOUNTER — Other Ambulatory Visit (INDEPENDENT_AMBULATORY_CARE_PROVIDER_SITE_OTHER): Payer: Self-pay | Admitting: FAMILY MEDICINE

## 2021-10-14 DIAGNOSIS — I10 Essential (primary) hypertension: Secondary | ICD-10-CM

## 2021-10-14 DIAGNOSIS — I951 Orthostatic hypotension: Secondary | ICD-10-CM

## 2021-10-16 ENCOUNTER — Other Ambulatory Visit (HOSPITAL_COMMUNITY): Payer: Self-pay | Admitting: Family Medicine

## 2021-10-16 LAB — BASIC METABOLIC PANEL, FASTING
ANION GAP: 9 mmol/L (ref 4–13)
BUN/CREA RATIO: 17 (ref 6–22)
BUN: 19 mg/dL (ref 8–25)
CALCIUM: 8.6 mg/dL — ABNORMAL LOW (ref 8.8–10.2)
CHLORIDE: 110 mmol/L (ref 96–111)
CO2 TOTAL: 21 mmol/L — ABNORMAL LOW (ref 23–31)
CREATININE: 1.12 mg/dL — ABNORMAL HIGH (ref 0.60–1.05)
ESTIMATED GFR: 48 mL/min/BSA — ABNORMAL LOW (ref >=60)
GLUCOSE: 74 mg/dL (ref 70–99)
POTASSIUM: 4.6 mmol/L (ref 3.5–5.1)
SODIUM: 140 mmol/L (ref 136–145)

## 2021-10-16 LAB — CBC/DIFF - CLIENT CONSOLIDATED
BASOPHIL #: <0.1 10*3/uL (ref <=0.20)
BASOPHIL %: 1 %
EOSINOPHIL #: 0.32 10*3/uL (ref <=0.50)
EOSINOPHIL %: 5 %
HCT: 33.5 % — ABNORMAL LOW (ref 34.8–46.0)
HGB: 10.4 g/dL — ABNORMAL LOW (ref 11.5–16.0)
IMMATURE GRANULOCYTE #: <0.1 10*3/uL (ref <0.10)
IMMATURE GRANULOCYTE %: 1 % (ref 0–1)
LYMPHOCYTE #: 2.4 10*3/uL (ref 1.00–4.80)
LYMPHOCYTE %: 37 %
MCH: 30.6 pg (ref 26.0–32.0)
MCHC: 31 g/dL (ref 31.0–35.5)
MCV: 98.5 fL (ref 78.0–100.0)
MONOCYTE #: 1.05 10*3/uL (ref 0.20–1.10)
MONOCYTE %: 16 %
MPV: 9.7 fL (ref 8.7–12.5)
NEUTROPHIL #: 2.58 10*3/uL (ref 1.50–7.70)
NEUTROPHIL %: 40 %
PLATELETS: 442 10*3/uL — ABNORMAL HIGH (ref 150–400)
RBC: 3.4 10*6/uL — ABNORMAL LOW (ref 3.85–5.22)
RDW-CV: 15.7 % — ABNORMAL HIGH (ref 11.5–15.5)
WBC: 6.4 10*3/uL (ref 3.7–11.0)

## 2021-10-16 LAB — CBC WITH DIFF
BASOPHIL #: <0.1 10*3/uL (ref <=0.20)
BASOPHIL %: 1 %
EOSINOPHIL #: 0.32 10*3/uL (ref <=0.50)
EOSINOPHIL %: 5 %
HCT: 33.5 % — ABNORMAL LOW (ref 34.8–46.0)
HGB: 10.4 g/dL — ABNORMAL LOW (ref 11.5–16.0)
IMMATURE GRANULOCYTE #: <0.1 10*3/uL (ref <0.10)
IMMATURE GRANULOCYTE %: 1 % (ref 0–1)
LYMPHOCYTE #: 2.4 10*3/uL (ref 1.00–4.80)
LYMPHOCYTE %: 37 %
MCH: 30.6 pg (ref 26.0–32.0)
MCHC: 31 g/dL (ref 31.0–35.5)
MCV: 98.5 fL (ref 78.0–100.0)
MONOCYTE #: 1.05 10*3/uL (ref 0.20–1.10)
MONOCYTE %: 16 %
MPV: 9.7 fL (ref 8.7–12.5)
NEUTROPHIL #: 2.58 10*3/uL (ref 1.50–7.70)
NEUTROPHIL %: 40 %
PLATELETS: 442 10*3/uL — ABNORMAL HIGH (ref 150–400)
RBC: 3.4 10*6/uL — ABNORMAL LOW (ref 3.85–5.22)
RDW-CV: 15.7 % — ABNORMAL HIGH (ref 11.5–15.5)
WBC: 6.4 10*3/uL (ref 3.7–11.0)

## 2021-10-20 ENCOUNTER — Other Ambulatory Visit (INDEPENDENT_AMBULATORY_CARE_PROVIDER_SITE_OTHER): Payer: Self-pay | Admitting: Internal Medicine

## 2021-10-25 ENCOUNTER — Encounter (INDEPENDENT_AMBULATORY_CARE_PROVIDER_SITE_OTHER): Payer: Self-pay | Admitting: CARDIOVASCULAR DISEASE

## 2021-10-26 ENCOUNTER — Ambulatory Visit (INDEPENDENT_AMBULATORY_CARE_PROVIDER_SITE_OTHER): Payer: Self-pay | Admitting: Internal Medicine

## 2021-10-27 ENCOUNTER — Other Ambulatory Visit: Payer: Self-pay

## 2021-10-27 ENCOUNTER — Inpatient Hospital Stay (HOSPITAL_BASED_OUTPATIENT_CLINIC_OR_DEPARTMENT_OTHER)
Admission: RE | Admit: 2021-10-27 | Discharge: 2021-10-27 | Disposition: A | Discharge status: Home / Self Care | Payer: Medicare Other | Source: Ambulatory Visit | Attending: Orthopaedic Surgery | Admitting: Orthopaedic Surgery

## 2021-10-27 ENCOUNTER — Encounter (INDEPENDENT_AMBULATORY_CARE_PROVIDER_SITE_OTHER): Payer: Self-pay | Admitting: Orthopaedic Surgery

## 2021-10-27 ENCOUNTER — Inpatient Hospital Stay (HOSPITAL_BASED_OUTPATIENT_CLINIC_OR_DEPARTMENT_OTHER)
Admission: RE | Admit: 2021-10-27 | Discharge: 2021-10-27 | Disposition: A | Discharge status: Home / Self Care | Payer: Medicare Other | Source: Ambulatory Visit

## 2021-10-27 ENCOUNTER — Ambulatory Visit: Payer: Medicare Other | Attending: Orthopaedic Surgery | Admitting: Orthopaedic Surgery

## 2021-10-27 VITALS — BP 138/84 | HR 70 | Temp 97.4°F | Resp 18 | Ht 66.0 in | Wt 112.0 lb

## 2021-10-27 DIAGNOSIS — S72401S Unspecified fracture of lower end of right femur, sequela: Secondary | ICD-10-CM | POA: Insufficient documentation

## 2021-10-27 DIAGNOSIS — M25561 Pain in right knee: Secondary | ICD-10-CM

## 2021-10-27 DIAGNOSIS — S32591D Other specified fracture of right pubis, subsequent encounter for fracture with routine healing: Secondary | ICD-10-CM

## 2021-10-27 DIAGNOSIS — S72011D Unspecified intracapsular fracture of right femur, subsequent encounter for closed fracture with routine healing: Secondary | ICD-10-CM | POA: Insufficient documentation

## 2021-10-27 DIAGNOSIS — S32511A Fracture of superior rim of right pubis, initial encounter for closed fracture: Secondary | ICD-10-CM | POA: Insufficient documentation

## 2021-10-27 DIAGNOSIS — Z96642 Presence of left artificial hip joint: Secondary | ICD-10-CM

## 2021-10-27 DIAGNOSIS — M8588 Other specified disorders of bone density and structure, other site: Secondary | ICD-10-CM

## 2021-10-27 DIAGNOSIS — Z96651 Presence of right artificial knee joint: Secondary | ICD-10-CM | POA: Insufficient documentation

## 2021-10-27 DIAGNOSIS — S329XXA Fracture of unspecified parts of lumbosacral spine and pelvis, initial encounter for closed fracture: Secondary | ICD-10-CM

## 2021-10-27 DIAGNOSIS — Z9889 Other specified postprocedural states: Secondary | ICD-10-CM

## 2021-10-27 DIAGNOSIS — S32591A Other specified fracture of right pubis, initial encounter for closed fracture: Secondary | ICD-10-CM | POA: Insufficient documentation

## 2021-10-27 DIAGNOSIS — R609 Edema, unspecified: Secondary | ICD-10-CM

## 2021-10-27 DIAGNOSIS — S72051A Unspecified fracture of head of right femur, initial encounter for closed fracture: Secondary | ICD-10-CM | POA: Insufficient documentation

## 2021-10-27 DIAGNOSIS — W19XXXA Unspecified fall, initial encounter: Secondary | ICD-10-CM | POA: Insufficient documentation

## 2021-10-27 DIAGNOSIS — S82034A Nondisplaced transverse fracture of right patella, initial encounter for closed fracture: Secondary | ICD-10-CM | POA: Insufficient documentation

## 2021-10-27 NOTE — Progress Notes (Signed)
[facility]  Department of Orthopedics  [address] [place] [address]  Phone [phone]      Patient: Theresa Greene    Date of Appointment: 10/27/2021     Reason for Visit:  Follow-up right pubic rami fractures and likely right femoral neck fracture.    Subjective:  87 year old female who had fallen and sustained above injuries about 5 weeks ago after which he was seen by myself in consultation after she was admitted by the hospitalist.  The patient was able to make good progress with therapy while hospitalized.  She did have some low back pain which she reports not being resolved.  She denies any present groin or hip pain.  Her main complaint is right knee pain.  She did have x-rays taken during her admission.  She had previously undergone a right total knee arthroplasty followed by a ORIF for a subsequent periprosthetic distal femur fracture.  The patient has also undergone placement of a trochanteric femoral nail for presumed intertrochanteric femur fracture.  Radiology had reported likely subcapital femoral neck fracture but this was nondisplaced and did have transfixion of the femoral neck with a helical blade from prior surgery.  She presents utilizing a walker accompanied by her husband and daughter.  Repeat radiographs of her right knee are obtained.  She is noted to have undergone resurfacing of her patella and association with the knee replacement.  On the lateral view she does have appear to have a nondisplaced transverse fracture of the distal pole of her patella.  She has tenderness in this area and reports that her pain is worse with flexion of the knee and improved if she lays down and keep her knee extended.  She has been able to walk and is presently doing home health therapy.  Her family asked about use of a knee immobilizer type brace but given that her fracture of the patella has not displaced over the last 5 weeks, it is recommended that she not use a brace but allow  pain to continue to be a guide with regard to knee range of motion in activity.  Her patellar fracture is believed to be stable and healing.  She has continued to have some swelling in her right knee.  Her family asked about leaving her own while her husband is doing therapy.  I recommend use of a Life Alert device in case she falls.  It is also recommended that she continue use of her walker and refrain from any stairs at home.  30 minutes spent reviewing x-rays, prior care and in discussion with patient and family.  .  Objective:  Vitals:    10/27/21 1019   BP: 138/84   Pulse: 70   Resp: 18   Temp: 36.3 C (97.4 F)   TempSrc: Thermal Scan   SpO2: 96%   Weight: 50.8 kg (112 lb)   Height: 1.676 m ( )   BMI: 18.12         Body mass index is 18.08 kg/m.  General:  Alert, oriented, no acute distress  Right knee:  Mild edema, mild tender palpation distal patellar pole, stable varus/valgus and anterior/posterior  Right hip:  Nontender passive motion, negative impingement, stable right femur  Ambulation:  Slow but normal gait with walker    Radiographic/other Studies:  New, pelvis, right pubic rami fractures stable with signs of healing, stable right subcapital femoral neck fracture with signs of healing, right knee-appears to have transverse distal pole nondisplaced  patella fracture, stable right total knee arthroplasty in good alignment, long lateral distal femoral plate traversing healed distal femur fracture    Assessment:    (S32.591D) Fracture of multiple pubic rami with routine healing, right  (primary encounter diagnosis)  Plan: XR PELVIS    (S82.034A) Closed nondisplaced transverse fracture of right patella, initial encounter  Plan: XR PELVIS, XR KNEE RIGHT 3 VIEW    (M25.561) Right knee pain  Plan: XR PATELLA RIGHT KNEE    (R91.638) History of total right knee replacement  Plan: XR PATELLA RIGHT KNEE    (S72.401S) Closed fracture of distal end of right femur, sequela    (S72.011D) Closed subcapital  fracture of neck of right femur with routine healing       Plan:  The patients diagnosis and treatment options, both non-surgical and surgical, were discussed at length.  Risks, benefits, alternatives and potential complications of these were discussed.  Findings of any radiographic studies or laboratory evaluation were discussed with the patient.  The  patient was given the opportunity to ask questions which were  then answered to the best of my ability.  The patient was informed of any follow up care or further studies needed and showed understanding of these.  These actions were discussed with the patient.    Activity/Work Modification:  Weight bear as tolerated, recommend allowing knee pain due to guide amount of activity  DME/Cast/Splint:  Continue use of walker, knee immobilizer or long brace not thought indicated at this time  Additional Medical Record needed for review:  Prior ortho if available  Medications:  Reviewed, avoid nonsteroidal anti-inflammatories, p.r.n. Tylenol, vitamin-D and calcium encouraged  Physical/Occupational Therapy:  Continue therapy but may limit ambulation as needed if having knee pain  Radiographic Studies:  Reviewed, discussed, shown  Therapeutic or Diagnostic Injections:  None  Surgical Intervention:  None  Referral:  None  Follow up:  4 weeks with right knee and pelvis x-rays          Orders Placed This Encounter   . XR PELVIS   . XR PATELLA RIGHT KNEE   . XR KNEE RIGHT 3 VIEW   . XR PELVIS            Clemon Chambers, MD

## 2021-10-27 NOTE — Nursing Note (Signed)
Travel Screening     Question Response    In the last 10 days, have you been in contact with someone who was confirmed or suspected to have Coronavirus/COVID-19? No / Unsure    Have you had a COVID-19 viral test in the last 10 days? No    Do you have any of the following new or worsening symptoms? None of these    Have you traveled internationally or domestically in the last month? No      Travel History   Travel since 09/29/21    No documented travel since 09/29/21

## 2021-10-28 ENCOUNTER — Other Ambulatory Visit (INDEPENDENT_AMBULATORY_CARE_PROVIDER_SITE_OTHER): Payer: Self-pay | Admitting: Internal Medicine

## 2021-10-29 ENCOUNTER — Other Ambulatory Visit (INDEPENDENT_AMBULATORY_CARE_PROVIDER_SITE_OTHER): Payer: Self-pay | Admitting: Internal Medicine

## 2021-10-30 ENCOUNTER — Encounter (INDEPENDENT_AMBULATORY_CARE_PROVIDER_SITE_OTHER): Payer: Self-pay | Admitting: Internal Medicine

## 2021-10-31 ENCOUNTER — Other Ambulatory Visit: Payer: Self-pay

## 2021-10-31 ENCOUNTER — Ambulatory Visit: Payer: Medicare Other | Admitting: Internal Medicine

## 2021-10-31 ENCOUNTER — Encounter (INDEPENDENT_AMBULATORY_CARE_PROVIDER_SITE_OTHER): Payer: Self-pay | Admitting: Internal Medicine

## 2021-10-31 VITALS — BP 150/72 | HR 95 | Temp 98.2°F | Resp 17 | Ht 66.0 in | Wt 107.8 lb

## 2021-10-31 DIAGNOSIS — I10 Essential (primary) hypertension: Secondary | ICD-10-CM | POA: Insufficient documentation

## 2021-10-31 DIAGNOSIS — R5381 Other malaise: Secondary | ICD-10-CM | POA: Insufficient documentation

## 2021-10-31 DIAGNOSIS — Z681 Body mass index (BMI) 19 or less, adult: Secondary | ICD-10-CM | POA: Insufficient documentation

## 2021-10-31 DIAGNOSIS — M06011 Rheumatoid arthritis without rheumatoid factor, right shoulder: Secondary | ICD-10-CM | POA: Insufficient documentation

## 2021-10-31 DIAGNOSIS — S72001A Fracture of unspecified part of neck of right femur, initial encounter for closed fracture: Secondary | ICD-10-CM | POA: Insufficient documentation

## 2021-10-31 DIAGNOSIS — W19XXXD Unspecified fall, subsequent encounter: Secondary | ICD-10-CM | POA: Insufficient documentation

## 2021-10-31 DIAGNOSIS — S72001D Fracture of unspecified part of neck of right femur, subsequent encounter for closed fracture with routine healing: Secondary | ICD-10-CM | POA: Insufficient documentation

## 2021-10-31 DIAGNOSIS — M06012 Rheumatoid arthritis without rheumatoid factor, left shoulder: Secondary | ICD-10-CM | POA: Insufficient documentation

## 2021-10-31 DIAGNOSIS — S32591D Other specified fracture of right pubis, subsequent encounter for fracture with routine healing: Secondary | ICD-10-CM | POA: Insufficient documentation

## 2021-10-31 DIAGNOSIS — E440 Moderate protein-calorie malnutrition: Secondary | ICD-10-CM | POA: Insufficient documentation

## 2021-10-31 DIAGNOSIS — Z79899 Other long term (current) drug therapy: Secondary | ICD-10-CM | POA: Insufficient documentation

## 2021-10-31 NOTE — Nursing Note (Signed)
Body mass index is 18.08 kg/m.    Fall Risk Assessment  Do you feel unsteady when standing or walking?: Yes  Do you worry about falling?: Yes  Have you fallen in the past year?: Yes  How many times have you fallen?: Once  Were you ever injured from falling?: Yes    PHQ Questionnaire  Little interest or pleasure in doing things.: Several Days  Feeling down, depressed, or hopeless: Several Days  PHQ 2 Total: 2  Trouble falling or staying asleep, or sleeping too much.: Not at all  Feeling tired or having little energy: Not at all  Poor appetite or overeating: Nearly every day (not eating much)  Feeling bad about yourself/ that you are a failure in the past 2 weeks?: Not at all  Trouble concentrating on things in the past 2 weeks?: Not at all  Moving/Speaking slowly or being fidgety or restless  in the past 2 weeks?: Not at all  Thoughts that you would be better off DEAD, or of hurting yourself in some way.: Not at all  If you checked off any problems, how difficult have these problems made it for you to do your work, take care of things at home, or get along with other people?: Somewhat difficult  PHQ 9 Total: 5  Interpretation of Total Score: 5-9 Mild depression         Travel Screening     Question Response    In the last 10 days, have you been in contact with someone who was confirmed or suspected to have Coronavirus/COVID-19? No / Unsure    Have you had a COVID-19 viral test in the last 10 days? No    Do you have any of the following new or worsening symptoms? None of these    Have you traveled internationally or domestically in the last month? No      Travel History   Travel since 10/03/21    No documented travel since 10/03/21       Functional Health Screening:   Patient is under 18: No  Have you had a recent unexplained weight loss or gain?: No  Because we are aware of abuse and domestic violence today, we ask all patients: Are you being hurt, hit, or frightened by anyone at your home or in your life?: No  Do  you have any basic needs within your home that are not being met? (such as Food, Shelter, Games developer, Transportation): Yes  Patient is under 18 and therefore has no Advance Directives: No  Patient has: Living Will, MPOA  Patient has Advance Directive: Yes          GAD-7 Questionnaire 10/31/2021   Feeling nervous,anxious,on edge 0   Not being able to stop or control worrying 0   Worrying too much about different things 0   Trouble relaxing 0   Being so restless that it is hard to sit still 0   Becoming easily annoyed or irritable 0   Feeling afraid as if something awful might happen 0   How difficult have these problems made it for you to work, take care of things at home, or get along with other people? Not difficult at all   Gad-7 Score Total 0   Interpretation 0-4, normal      Tana Coast, LPN  05/11/2445, 28:63

## 2021-10-31 NOTE — Progress Notes (Unsigned)
[facility]  PEDIATRICS/INTERNAL MEDICINE, [facility]  [address]  [place] [address]  [phone]  11/01/2021       Name:  Theresa Greene MRN: [id number]   Date:    10/31/2021 Age:  87 y.o.       Reason for Visit:  Chief Complaint   Patient presents with   . Transition From [facility]        Subjective:   Theresa Greene is a 87 y.o. female who comes in today for a follow up after hospital admission and skilled facility discharge. She was admitted here at [facility] on 09/25/2021 after she fell at home and started with right hip pain. The fall happened after she turned too quickly and lost her balance. She was found to have a right hip periprosthetic hip fracture and right superior and inferior pubic rami fracture. She was evaluated by Dr. Manson Passey during the admission and it was felt that non-operative treatment was appropriate with weight bearing as tolerated. She was transitioned to a Swing Bed for physical therapy here at [facility] from 09/28/2021 through 10/09/2021 when she was discharged to [facility] for further rehabilitation in [place]. She was discharged from their facility on 10/19/2021. Her daughter comes in to her visit with her today.     Prior to the fall, she was having a lot of issues with flares of arthritis in both of her shoulders. She was on and off of steroids. She had an appointment to see Dr. Caralyn Guile and had some testing completed, but she fell and did not make it to the follow up appoitnment. Her daughter says that the rheumatoid factor was negative, so they are not sure that she really has rheumatoid arthritis and it might actually just be osteoarthritis. Theresa Greene says that all of her pain is gone in her hip. She just has some pain remaining in her knee. She is supposed to have therapy through [facility] twice a week and she also has skilled nursing weekly.     Her daughter and messaged me  on here prior to the appointment. Theresa Greene's husband had previously helped to care for her, but he recently had a stroke and so he isn't able to help care for her as he once did. After the appointment, she also pulled me aside to tell me that his lymphoma has returned. With these new issues, he will not be able to help at all with her. Her daughter has tried to get Theresa Greene to agree that they need someone to stay with them full time, but she refuses. At today's visit, her daughter discloses that even prior to his stroke, her husband felt that she wasn't safe at home alone. He could not leave her to go to church, visit friends, etc. Without coming home to some type of mess to clean up. Her daughter says that Milagros doesn't like to leave the house now. She really only leaves to go to church on Sunday evenings, but doesn't go to the store or anything anymore. Theresa Greene is convinced that it was the type of shoes she was wearing that led to her last fall and she says that she will not fall again because she is being extremely careful.      The vision in Theresa Greene's right eye is very bad, but she says this is not causing any of her problems or leading to her falls. Her daughter also points out that she  was down to 103 pounds when she fell. Today, she is up to 107. She says that they have been making sure she eats and takes in high protein drinks while she was in rehab. I asked [name] if she was eating at home. She says that she has been preparing her food and prior to the fall her husband would help. Her daughter says that she has to sit on a stool to fix food for them because she cannot stand as long as what is required. PT recommended that she make laps through the house every day. She will walk into the different rooms of the house, but then sits down and doesn't actually keep going with the exercise to build up endurance. She doesn't feel like she needs this or that she needs the PT.     PHQ Total Score  PHQ 2 Total: 2  PHQ 9 Total:  5  Interpretation of Total Score: 5-9 Mild depression    Past Medical History:    Past Medical History:   Diagnosis Date   . Arthritis    . Asthma    . Back problem    . CAD S/P percutaneous coronary angioplasty 2010    Pt reports 5 years ago   . Charcot's joint of foot, left 08/06/2014   . Colon polyp    . CVA (cerebrovascular accident) (CMS HCC) 1995    no residual deficits   . Deaf    . Diverticulitis of colon    . Gastroesophageal reflux disease 08/06/2014   . H/O hearing loss    . History of cardiac pacemaker 08/06/2014   . HTN (hypertension)    . Hyperlipidemia 12/25/2019   . Muscle weakness    . Neuropathy (CMS HCC)    . Pacemaker    . Paroxysmal atrial fibrillation (CMS HCC) 08/06/2014   . PUD (peptic ulcer disease) 08/06/2014    C/b GIB Spring 2015   . Rheumatoid arthritis (CMS HCC) 08/06/2014   . Stage 3 chronic kidney disease (CMS HCC) 12/25/2019    Last Assessment & Plan:  Renal function stable, at baseline Stable electrolytes; restart sodium bicarb 650 mg BID  Continue renal protective ARB Discussed CKD risk factor modifications, including tight BP/glucose control, low protein diet, and avoidance of NSAIDs   . Stented coronary artery    . Vitamin D deficiency 12/25/2019   . Wears glasses       Past Surgical History:    Past Surgical History:   Procedure Laterality Date   . ANKLE SURGERY Left     repair with metal   . CATARACT EXTRACTION      unsure which eye but thinks she only had one eye done   . CORONARY ARTERY ANGIOPLASTY  2011   . ESOPHAGOGASTRODUODENOSCOPY     . HIP SURGERY Right     repair with metal   . HX BACK SURGERY      lower back x 3 no metal   . HX COLONOSCOPY     . HX HIP REPLACEMENT Left    . HX NISSEN FUNDOPLICATION  2002    then three others to repair problems   . HX OPEN CHOLECYSTECTOMY  1962    with common duxt exploration   . HX PACEMAKER INSERTION  2019   . HX SHOULDER SURGERY Bilateral     scopes   . KNEE ARTHROPLASTY Bilateral    . OPEN REDUCTION INTERNAL FIXATION FRACTURE FEMUR  Right 05/07/2020    Performed by Graciela Husbands,  Dawson Bills, MD at [facility] OR 5 NORTH   . ORIF DISTAL FEMUR FRACTURE Right    . 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 PANCREATIC PSEUDOCYST DRAINAGE  1971   . REPAIR ECTROPION Right 09/21/2021    Performed by Pierce Crane, MD at [facility] OR MAIN   . REPAIR PTOSIS Left 09/21/2021    Performed by Pierce Crane, MD at [facility] OR MAIN   . TOTAL ABDOMINAL HYSTERECTOMY       Family History:    Family Medical History:     Problem Relation (Age of Onset)    Brain cancer Mother    Ovarian Cancer Mother    Pancreatic Cancer Father           Social History:   Social History     Tobacco Use   . Smoking status: Never   . Smokeless tobacco: Never   Substance Use Topics   . Alcohol use: Never      Social History     Substance and Sexual Activity   Drug Use Never      Social History     Social History Narrative    Lives at home with husband. 2 grown daughters - an Charity fundraiser and a Mining engineer. She did Building control surveyor for 23 years.         Allergies:    Allergies   Allergen Reactions   . Iodinated Contrast Media Anaphylaxis   . Iv Contrast Anaphylaxis     makese her serverly sick   . Etodolac NO Steroids unless approved by Attending Physician   . Symbicort [Budesonide-Formoterol]    . Codeine  Other Adverse Reaction (Add comment) and Nausea/ Vomiting     Stomach pain  Stomach pain        Home Medications:    Current Outpatient Medications:   .  amiodarone (PACERONE) 200 mg Oral Tablet, Take 0.5 Tablets (100 mg total) by mouth Once a day for 90 days, Disp: 45 Tablet, Rfl: 3  .  amLODIPine (NORVASC) 5 mg Oral Tablet, Take 1 Tablet (5 mg total) by mouth Once a day, Disp: 90 Tablet, Rfl: 4  .  atorvastatin (LIPITOR) 20 mg Oral Tablet, TAKE ONE TABLET BY MOUTH DAILY, Disp: 90 Tablet, Rfl: 3  .  clopidogreL (PLAVIX) 75 mg Oral Tablet, TAKE ONE TABLET BY MOUTH DAILY, Disp: 90 Tablet, Rfl: 3  .  CREON 36,000-114,000- 180,000 unit Oral Capsule, Delayed  Release(E.C.), 2 Capsules Three times daily with meals Additional two capsules twice with largest snacks of the day. (Five times a day total), Disp: , Rfl:   .  Dexlansoprazole (DEXILANT) 60 mg Oral Cap, Delayed Rel., Multiphasic, Take 1 Capsule (60 mg total) by mouth Once a day, Disp: , Rfl:   .  ergocalciferol, vitamin D2, (DRISDOL) 1,250 mcg (50,000 unit) Oral Capsule, Take 1 Capsule (50,000 Units total) by mouth Every 7 days Takes on Fridays, Disp: , Rfl:   .  ezetimibe (ZETIA) 10 mg Oral Tablet, TAKE ONE TABLET BY MOUTH DAILY, Disp: 90 Tablet, Rfl: 3  .  gabapentin (NEURONTIN) 100 mg Oral Capsule, Take 1 Capsule (100 mg total) by mouth Twice daily, Disp: 180 Capsule, Rfl: 1  .  losartan (COZAAR) 25 mg Oral Tablet, Take 1 Tablet (25 mg total) by mouth Once a day, Disp: , Rfl:   .  metoprolol succinate (TOPROL-XL) 100 mg Oral Tablet Sustained Release  24 hr, Take 1 Tablet (100 mg total) by mouth Once a day, Disp: 90 Tablet, Rfl: 4  .  metoprolol succinate (TOPROL-XL) 50 mg Oral Tablet Sustained Release 24 hr, TAKE ONE TABLET BY MOUTH DAILY, Disp: 90 Tablet, Rfl: 0       Review of Systems:   All systems have been reviewed and found to be negative except for as stated above in the history of present illness.     Physical Examination:   Vitals: BP (!) 150/72   Pulse 95   Temp 36.8 C (98.2 F) (Thermal Scan)   Resp 17   Ht 1.676 m ( )   Wt 48.9 kg (107 lb 12.8 oz)   SpO2 95%   BMI 17.40 kg/m     General:  No apparent distress.  Well nourished and well developed.  Interactive. Extremely frail and cachectic.  HEENT:  Atraumatic, normocephalic.  Sclera and conjunctiva are clear. Nares patent without lesion or discharge. Mucous membranes moist. Tongue protrudes midline. No tonsillar erythema or exudates.  Neck:  Supple. Trachea is midline. No JVD. No adenopathy. No thyromegaly.  Respiratory: No respiratory distress. Lungs clear to auscultation bilaterally. No wheezes, rales, or rhonchi.  Cardiovascular:  Regular rate and rhythm. S1, S2 present. No murmurs, rubs, or gallops. Pulses are +2 and equal in all extremities.  Abdomen:  Soft and non-distended.  Non-tender. Bowel sounds present and normoactive in all quadrants. No hepatosplenomegaly. No guarding or rebound tenderness.  Skin: Pink and warm. No rashes or petechiae noted.  Extremities:  No clubbing, cyanosis or edema.     Assessment:   (R53.81) Physical debility  (primary encounter diagnosis)    (S72.001A) Closed right hip fracture (CMS HCC)    (S32.591D) Closed fracture of multiple pubic rami, right, with routine healing, subsequent encounter    (I10) Primary hypertension    (M06.011,  M06.012) Rheumatoid arthritis involving both shoulders with negative rheumatoid factor (CMS HCC)    (E44.0) Moderate protein-calorie malnutrition (CMS HCC)     Plan:   Continue with home health for skilled nursing and PT. I do not think she is as self-efficient as she states. She had previously relied on her husband a lot. I think having someone to stay with them is a great idea for her protection and safety. She is going to require some warming up to the idea, but hopefully our conversation today will help her daughter get the process under way.   Weight bearing as tolerated. She is getting around with the walker. Follow up with Dr. Manson Passey.   She denies any further dizzy episodes. It looks like her blood pressure is running in the 140-150 range. I am okay with this, as I want to prevent any orthostatic hypotension due to her history of falls. Continue on losartan 25 mg oral daily, Toprol XL  oral daily, and amlodipine  oral daily.   The rheumatoid factor is negative. It is hard to say if she truly had rheumatoid at this point or not. A CCP would have been more helpful to diagnose this. Supportive care and pain management as needed.   Again, a caregiver would be an excellent idea to ensure she is getting adequate nutrition as well.     Patient was counseled on diagnosis,  follow-up, and side effect of medications.  Reasons for follow up in clinic or emergency department were discussed.    Orders:  No orders of the defined types were placed in this encounter.  Referrals:   Orders Placed This Encounter   No orders of the following type(s) were placed in this encounter: Outpatient Referral.      Discharge Medication List:       Current Discharge Medication List          Accurate as of October 31, 2021 11:59 PM. If you have any questions, ask your nurse or doctor.            CONTINUE these medications which have CHANGED during your visit.      Details   losartan 25 mg Tablet  Commonly known as: COZAAR  What changed: Another medication with the same name was removed. Continue taking this medication, and follow the directions you see here.  Changed by: Jeanice Lim Hill-Reinert, DO   25 mg, Oral, DAILY  Refills: 0        CONTINUE these medications - NO CHANGES were made during your visit.      Details   amiodarone 200 mg Tablet  Commonly known as: PACERONE   100 mg, Oral, DAILY  Qty: 45 Tablet  Refills: 3     amLODIPine 5 mg Tablet  Commonly known as: NORVASC   5 mg, Oral, DAILY  Qty: 90 Tablet  Refills: 4     atorvastatin 20 mg Tablet  Commonly known as: LIPITOR   TAKE ONE TABLET BY MOUTH DAILY  Qty: 90 Tablet  Refills: 3     clopidogreL 75 mg Tablet  Commonly known as: PLAVIX   TAKE ONE TABLET BY MOUTH DAILY  Qty: 90 Tablet  Refills: 3     Creon 36,000-114,000- 180,000 unit Capsule, Delayed Release(E.C.)  Generic drug: lipase-protease-amylase   2 Capsules, 3 TIMES DAILY WITH MEALS, Additional two capsules twice with largest snacks of the day. (Five times a day total)  Refills: 0     Dexlansoprazole 60 mg Cap, Delayed Rel., Multiphasic  Commonly known as: DEXILANT   60 mg, Oral, DAILY  Refills: 0     ergocalciferol (vitamin D2) 1,250 mcg (50,000 unit) Capsule  Commonly known as: DRISDOL   50,000 Units, Oral, EVERY 7 DAYS, Takes on Fridays  Refills: 0     ezetimibe 10 mg Tablet  Commonly known  as: ZETIA   TAKE ONE TABLET BY MOUTH DAILY  Qty: 90 Tablet  Refills: 3     gabapentin 100 mg Capsule  Commonly known as: NEURONTIN   100 mg, Oral, 2 TIMES DAILY  Qty: 180 Capsule  Refills: 1     * metoprolol succinate 100 mg Tablet Sustained Release 24 hr  Commonly known as: TOPROL-XL   100 mg, Oral, DAILY  Qty: 90 Tablet  Refills: 4     * metoprolol succinate 50 mg Tablet Sustained Release 24 hr  Commonly known as: TOPROL-XL   TAKE ONE TABLET BY MOUTH DAILY  Qty: 90 Tablet  Refills: 0         * This list has 2 medication(s) that are the same as other medications prescribed for you. Read the directions carefully, and ask your doctor or other care provider to review them with you.                   Follow up: Return in about 3 months (around 01/31/2022) for HTN, CKD. Patient was advised to follow up sooner if issues arise.     Clementine Soulliere Hill-Reinert, DO    This note was partially created using M*Modal fluency direct system (voice recognition software ) and is inherently  subject to errors including those of syntax and "sound- alike" substitutions which may escape proofreading.  In such instances, original meaning may be extrapolated by contextual derivation.

## 2021-11-21 ENCOUNTER — Other Ambulatory Visit (INDEPENDENT_AMBULATORY_CARE_PROVIDER_SITE_OTHER): Payer: Self-pay | Admitting: Internal Medicine

## 2021-11-21 ENCOUNTER — Encounter (INDEPENDENT_AMBULATORY_CARE_PROVIDER_SITE_OTHER): Payer: Medicare Other | Admitting: CARDIOVASCULAR DISEASE

## 2021-11-21 MED ORDER — DEXLANSOPRAZOLE 60 MG CAPSULE,BIPHASE DELAYED RELEASE
60.0000 mg | DELAYED_RELEASE_CAPSULE | Freq: Every day | ORAL | 0 refills | Status: AC
Start: 2021-11-21 — End: 2022-03-10

## 2021-11-24 ENCOUNTER — Inpatient Hospital Stay (HOSPITAL_BASED_OUTPATIENT_CLINIC_OR_DEPARTMENT_OTHER)
Admission: RE | Admit: 2021-11-24 | Discharge: 2021-11-24 | Disposition: A | Discharge status: Home / Self Care | Payer: Medicare Other | Source: Ambulatory Visit | Attending: Orthopaedic Surgery | Admitting: Orthopaedic Surgery

## 2021-11-24 ENCOUNTER — Encounter (INDEPENDENT_AMBULATORY_CARE_PROVIDER_SITE_OTHER): Payer: Self-pay | Admitting: Orthopaedic Surgery

## 2021-11-24 ENCOUNTER — Other Ambulatory Visit: Payer: Self-pay

## 2021-11-24 ENCOUNTER — Ambulatory Visit: Payer: Medicare Other | Attending: Orthopaedic Surgery | Admitting: Orthopaedic Surgery

## 2021-11-24 VITALS — HR 69 | Temp 96.8°F | Resp 18 | Ht 66.0 in | Wt 107.0 lb

## 2021-11-24 DIAGNOSIS — S82034D Nondisplaced transverse fracture of right patella, subsequent encounter for closed fracture with routine healing: Secondary | ICD-10-CM

## 2021-11-24 DIAGNOSIS — M25511 Pain in right shoulder: Secondary | ICD-10-CM

## 2021-11-24 DIAGNOSIS — S82034A Nondisplaced transverse fracture of right patella, initial encounter for closed fracture: Secondary | ICD-10-CM | POA: Insufficient documentation

## 2021-11-24 DIAGNOSIS — S32591D Other specified fracture of right pubis, subsequent encounter for fracture with routine healing: Secondary | ICD-10-CM | POA: Insufficient documentation

## 2021-11-24 DIAGNOSIS — M25512 Pain in left shoulder: Secondary | ICD-10-CM | POA: Insufficient documentation

## 2021-11-24 DIAGNOSIS — S72011D Unspecified intracapsular fracture of right femur, subsequent encounter for closed fracture with routine healing: Secondary | ICD-10-CM

## 2021-11-24 NOTE — Nursing Note (Signed)
Travel Screening     Question Response    In the last 10 days, have you been in contact with someone who was confirmed or suspected to have Coronavirus/COVID-19? No / Unsure    Have you had a COVID-19 viral test in the last 10 days? No    Do you have any of the following new or worsening symptoms? None of these    Have you traveled internationally or domestically in the last month? No      Travel History   Travel since 10/24/21    No documented travel since 10/24/21       Leanora Ivanoff, LPN  [id number], [id number]

## 2021-11-25 ENCOUNTER — Encounter (INDEPENDENT_AMBULATORY_CARE_PROVIDER_SITE_OTHER): Payer: Self-pay | Admitting: Orthopaedic Surgery

## 2021-11-25 NOTE — Progress Notes (Signed)
[facility]  Department of Orthopedics  [address] [place] [address]  Phone [phone]      Patient: Theresa Greene    Date of Appointment: 11/24/2021     Reason for Visit: follow up rt pubic rami, rt subcap fem neck and rt patella fx, 2 mon out nonop tx    Subjective: 87 yo fem who sustained above in fall, back on her own ambulating with a walker.  Pt noted by radiology to have nondisp subcap fem neck fx around prior short TFN helical blade.  Pt's main complaint when seen 4 wks ago was pain localized to distal pole patella where it appeared she had nondisplaced transverse fx that was difficult to see having undergone prior TKA and ORIF distal femur fracture.  Pt says that knee/patellar pain has resolved.  Denies any groin pain.  Does have some chronic occ lat rt hip pain in area of helical blade.  Pt says she did have to be on oral steroids 3 times over winter for bilat shoulder pain which is worse in winter.  Asks about possibly receiving injs for her shoulders if needed.  Appears to have undergone prior surgery lt shoulder with small anchor noted on xrays from 2022.  No rt shoulder xrays in Epic and med record of past care not available.  Reported to have had rt shoulder surgery as well.  Offer to evaluate further at f/u appt in 2 mon.    Objective:  Vitals:    11/24/21 1111   Pulse: 69   Resp: 18   Temp: 36 C (96.8 F)   TempSrc: Temporal   SpO2: 96%   Weight: 48.5 kg (107 lb)   Height: 1.676 m (5\' 6" )   BMI: 17.31         Body mass index is 17.27 kg/m.  Gen: alert, oriented, NAD  Rt Knee: non-tender patella, no effusion, stable varus/valgus and ant/post, no pain with flexion to 120  Rt Hip: mild tender laterally, hip motion non-tender, neg impingement  Ambulation: slow, normal gait with walker    Radiographic/other Studies: new, pelvis and rt knee, stable alignment with signs healing rt pubic rami, rt fem neck and rt distal patellar pole    Assessment:    (S32.591D) Fracture of  multiple pubic rami with routine healing, right  (primary encounter diagnosis)  Plan: XR PELVIS    (S72.011D) Closed subcapital fracture of neck of right femur with routine healing  Plan: XR PELVIS    (S82.034D) Closed nondisplaced transverse fracture of right patella with routine healing, subsequent encounter    (M25.511,  M25.512) Bilateral shoulder pain, unspecified chronicity  Plan: XR SHOULDER RIGHT       Plan:  The patients diagnosis and treatment options, both non-surgical and surgical, were discussed at length.  Risks, benefits, alternatives and potential complications of these were discussed.  Findings of any radiographic studies or laboratory evaluation were discussed with the patient.  The  patient was given the opportunity to ask questions which were  then answered to the best of my ability.  The patient was informed of any follow up care or further studies needed and showed understanding of these.  These actions were discussed with the patient.    Activity/Work Modification: may WBAT  DME/Cast/Splint: use walker  Additional Medical Record needed for review: prior care for shoulders  Medications: reviewed, discussed oral steroids negative affect on bone quality, fx risk  Physical/Occupational Therapy: has done after fall  Radiographic Studies:  reviewed, discussed, shown  Therapeutic or Diagnostic Injections: consider for shoulders at follow up if needed  Surgical Intervention: none  Referral: none  Follow up: 2 mon with AP pelvis and Rt shoulder xrays          Orders Placed This Encounter   . XR PELVIS   . XR SHOULDER RIGHT            Clemon Chambers, MD

## 2021-12-06 ENCOUNTER — Other Ambulatory Visit (INDEPENDENT_AMBULATORY_CARE_PROVIDER_SITE_OTHER): Payer: Self-pay | Admitting: Internal Medicine

## 2021-12-09 ENCOUNTER — Emergency Department (EMERGENCY_DEPARTMENT_HOSPITAL): Payer: Medicare Other

## 2021-12-09 ENCOUNTER — Emergency Department
Admission: EM | Admit: 2021-12-09 | Discharge: 2021-12-09 | Disposition: A | Discharge status: Home / Self Care | Payer: Medicare Other | Attending: Physician Assistant | Admitting: Physician Assistant

## 2021-12-09 ENCOUNTER — Encounter (HOSPITAL_COMMUNITY): Payer: Self-pay

## 2021-12-09 ENCOUNTER — Other Ambulatory Visit: Payer: Self-pay

## 2021-12-09 DIAGNOSIS — M19011 Primary osteoarthritis, right shoulder: Secondary | ICD-10-CM

## 2021-12-09 DIAGNOSIS — M25511 Pain in right shoulder: Secondary | ICD-10-CM | POA: Insufficient documentation

## 2021-12-09 DIAGNOSIS — G8929 Other chronic pain: Secondary | ICD-10-CM | POA: Insufficient documentation

## 2021-12-09 DIAGNOSIS — M7989 Other specified soft tissue disorders: Secondary | ICD-10-CM

## 2021-12-09 DIAGNOSIS — M898X2 Other specified disorders of bone, upper arm: Secondary | ICD-10-CM

## 2021-12-09 DIAGNOSIS — M069 Rheumatoid arthritis, unspecified: Secondary | ICD-10-CM | POA: Insufficient documentation

## 2021-12-09 DIAGNOSIS — I10 Essential (primary) hypertension: Secondary | ICD-10-CM | POA: Insufficient documentation

## 2021-12-09 MED ORDER — LOSARTAN 50 MG TABLET
25.0000 mg | ORAL_TABLET | ORAL | Status: AC
Start: 2021-12-09 — End: 2021-12-09
  Administered 2021-12-09: 25 mg via ORAL
  Filled 2021-12-09: qty 1

## 2021-12-09 MED ORDER — METOPROLOL SUCCINATE ER 50 MG TABLET,EXTENDED RELEASE 24 HR
100.0000 mg | ORAL_TABLET | ORAL | Status: AC
Start: 2021-12-09 — End: 2021-12-09
  Administered 2021-12-09: 100 mg via ORAL
  Filled 2021-12-09: qty 2

## 2021-12-09 MED ORDER — OXYCODONE-ACETAMINOPHEN 5 MG-325 MG TABLET
2.0000 | ORAL_TABLET | ORAL | Status: AC
Start: 2021-12-09 — End: 2021-12-09
  Administered 2021-12-09: 2 via ORAL
  Filled 2021-12-09: qty 2

## 2021-12-09 MED ORDER — HYDROCODONE 5 MG-ACETAMINOPHEN 325 MG TABLET
1.0000 | ORAL_TABLET | Freq: Three times a day (TID) | ORAL | 0 refills | Status: DC | PRN
Start: 2021-12-09 — End: 2021-12-25

## 2021-12-09 MED ORDER — MORPHINE 4 MG/ML INTRAVENOUS SOLUTION
3.0000 mg | INTRAVENOUS | Status: AC
Start: 2021-12-09 — End: 2021-12-09
  Administered 2021-12-09: 3 mg via INTRAMUSCULAR
  Filled 2021-12-09: qty 1

## 2021-12-09 MED ORDER — AMLODIPINE 5 MG TABLET
5.0000 mg | ORAL_TABLET | ORAL | Status: AC
Start: 2021-12-09 — End: 2021-12-09
  Administered 2021-12-09: 5 mg via ORAL
  Filled 2021-12-09: qty 1

## 2021-12-09 NOTE — ED Triage Notes (Signed)
Patient states she has a history of RA and right shoulder pain; today is the worst its been. Started around 4am today, she is a patient of Dr. Manson Passey.

## 2021-12-09 NOTE — ED Nurses Note (Signed)
Patient family at bedside; warm blanket given. No concerns or complaints at this time.

## 2021-12-09 NOTE — ED Nurses Note (Signed)
Patient returned from X-Ray; Family at bedside.

## 2021-12-09 NOTE — ED Provider Notes (Signed)
Department of Emergency Medicine  [facility]  12/09/2021    HPI  Patient is a 87 y.o. female presenting to the ED with chief complaint of chronic intermittent right shoulder pain due to rheumatoid arthritis.  She states that she has pain in bilateral shoulders, but it has never been this bad before.  Right shoulder pain woke her up at 4 o'clock this morning, she has tried taking extra-strength Tylenol a couple of times and used a heating pad without relief.  She is guarding the shoulder, states that any kind of range of motion exacerbates pain.  She saw her rheumatologist Dr Caralyn Guile recently who started her on methotrexate, she took her 1st dose 2 days ago.  States that she also saw Dr. Manson Passey  on 04/14 following right pubic rami, right femoral neck and right patellar fractures 2 months ago.  He offered to discuss injection of the right shoulder at follow-up visit in 2 months, no X-rays of R shoulder were available to review.  Patient denies any injury or repetitive movement, denies any fevers, chills, chest pain, back pain, neck pain. States that this pain is similar to her normal pain in terms of nature/location, but it is just worse.    Review of Systems   Constitutional: Negative for chills, fever and malaise/fatigue.   Eyes: Negative for blurred vision.   Respiratory: Negative for shortness of breath.    Cardiovascular: Negative for chest pain.   Gastrointestinal: Negative for abdominal pain, nausea and vomiting.   Musculoskeletal: Positive for joint pain (R shoulder) and myalgias. Negative for back pain and falls.   Skin: Negative for rash.   Neurological: Negative for sensory change and focal weakness.   All other systems reviewed and are negative.       All other systems reviewed and are negative, unless commented on in the HPI.     Past Medical History:   Diagnosis Date   . Arthritis    . Asthma    . Back problem    . CAD S/P percutaneous coronary angioplasty 2010    Pt reports 5 years ago   .  Charcot's joint of foot, left 08/06/2014   . Colon polyp    . CVA (cerebrovascular accident) (CMS HCC) 1995    no residual deficits   . Deaf    . Diverticulitis of colon    . Gastroesophageal reflux disease 08/06/2014   . H/O hearing loss    . History of cardiac pacemaker 08/06/2014   . HTN (hypertension)    . Hyperlipidemia 12/25/2019   . Muscle weakness    . Neuropathy (CMS HCC)    . Pacemaker    . Paroxysmal atrial fibrillation (CMS HCC) 08/06/2014   . PUD (peptic ulcer disease) 08/06/2014    C/b GIB Spring 2015   . Rheumatoid arthritis (CMS HCC) 08/06/2014   . Stage 3 chronic kidney disease (CMS HCC) 12/25/2019    Last Assessment & Plan:  Renal function stable, at baseline Stable electrolytes; restart sodium bicarb 650 mg BID  Continue renal protective ARB Discussed CKD risk factor modifications, including tight BP/glucose control, low protein diet, and avoidance of NSAIDs   . Stented coronary artery    . Vitamin D deficiency 12/25/2019   . Wears glasses      Past Surgical History:   Procedure Laterality Date   . Ankle surgery Left    . Cataract extraction     . Coronary artery angioplasty  2011   . Esophagogastroduodenoscopy     .  Hip surgery Right    . Hx back surgery     . Hx colonoscopy     . Hx hip replacement Left    . Hx nissen fundoplication  2002   . Hx open cholecystectomy  1962   . Hx pacemaker insertion  2019   . Hx shoulder surgery Bilateral    . Knee arthroplasty Bilateral    . Orif distal femur fracture Right    . Other surgical history Right    . Pancreatic pseudocyst drainage  1971   . Total abdominal hysterectomy       Allergies   Allergen Reactions   . Iodinated Contrast Media Anaphylaxis   . Iv Contrast Anaphylaxis     makese her serverly sick   . Etodolac NO Steroids unless approved by Attending Physician   . Symbicort [Budesonide-Formoterol]    . Codeine  Other Adverse Reaction (Add comment) and Nausea/ Vomiting     Stomach pain  Stomach pain        Current Outpatient Medications    Medication Sig   . amiodarone (PACERONE) 200 mg Oral Tablet Take 0.5 Tablets (100 mg total) by mouth Once a day for 90 days   . amLODIPine (NORVASC) 5 mg Oral Tablet Take 1 Tablet (5 mg total) by mouth Once a day   . atorvastatin (LIPITOR) 20 mg Oral Tablet TAKE ONE TABLET BY MOUTH DAILY   . calcitrioL (ROCALTROL) 0.25 mcg Oral Capsule TAKE ONE CAPSULE BY MOUTH DAILY   . clopidogreL (PLAVIX) 75 mg Oral Tablet TAKE ONE TABLET BY MOUTH DAILY   . CREON 36,000-114,000- 180,000 unit Oral Capsule, Delayed Release(E.C.) 2 Capsules Three times daily with meals Additional two capsules twice with largest snacks of the day. (Five times a day total)   . Dexlansoprazole (DEXILANT) 60 mg Oral Cap, Delayed Rel., Multiphasic Take 1 Capsule (60 mg total) by mouth Once a day for 90 days   . ergocalciferol, vitamin D2, (DRISDOL) 1,250 mcg (50,000 unit) Oral Capsule Take 1 Capsule (50,000 Units total) by mouth Every 7 days Takes on Fridays   . ezetimibe (ZETIA) 10 mg Oral Tablet TAKE ONE TABLET BY MOUTH DAILY   . gabapentin (NEURONTIN) 100 mg Oral Capsule Take 1 Capsule (100 mg total) by mouth Twice daily   . gemfibroziL (LOPID) 600 mg Oral Tablet    . HYDROcodone-acetaminophen (NORCO) 5-325 mg Oral Tablet Take 1 Tablet by mouth Every 8 hours as needed for Pain   . losartan (COZAAR) 25 mg Oral Tablet Take 1 Tablet (25 mg total) by mouth Once a day   . methotrexate 2.5 mg Oral tablet Take 1 Tablet (2.5 mg total) by mouth Every 7 days Take 4 Tablets   . metoprolol succinate (TOPROL-XL) 100 mg Oral Tablet Sustained Release 24 hr Take 1 Tablet (100 mg total) by mouth Once a day   . metoprolol succinate (TOPROL-XL) 50 mg Oral Tablet Sustained Release 24 hr TAKE ONE TABLET BY MOUTH DAILY      Above history reviewed with patient.  Previous records also reviewed.     Physical Exam:     Filed Vitals:    12/09/21 1330 12/09/21 1400 12/09/21 1430 12/09/21 1445   BP: (!) 213/85 (!) 204/84 (!) 188/84 (!) 158/98   Pulse:  70     Resp:  16      Temp:  36.7 C (98 F)     SpO2: 94% 94% 92%        Physical Exam  Vitals  and nursing note reviewed.   Constitutional:       General: She is not in acute distress.     Appearance: Normal appearance. She is not ill-appearing.   HENT:      Head: Normocephalic and atraumatic.      Nose: Nose normal.      Mouth/Throat:      Mouth: Mucous membranes are moist.   Eyes:      Extraocular Movements: Extraocular movements intact.      Pupils: Pupils are equal, round, and reactive to light.   Cardiovascular:      Rate and Rhythm: Normal rate and regular rhythm.      Heart sounds: Normal heart sounds.   Pulmonary:      Effort: Pulmonary effort is normal. No respiratory distress.      Breath sounds: Normal breath sounds.   Abdominal:      General: There is no distension.   Musculoskeletal:      Cervical back: No tenderness.      Comments: Patient guarding right shoulder keeping arm tucked close to body.  She is circumferentially tender about the right shoulder. No tenderness of humerus, clavicle, chest or over scapula.  Nontender over [facility] joint.  Mild swelling of right shoulder compared to left, no warmth or erythema.   Skin:     General: Skin is warm and dry.   Neurological:      General: No focal deficit present.      Mental Status: She is alert and oriented to person, place, and time.   Psychiatric:         Mood and Affect: Mood normal.        Orders:  Orders Placed This Encounter   . XR SHOULDER RIGHT   . morphine 4 mg/mL injection   . metoprolol succinate (TOPROL-XL) 24 hr extended release tablet   . HYDROcodone-acetaminophen (NORCO) 5-325 mg Oral Tablet   . losartan (COZAAR) tablet   . amLODIPine (NORVASC) tablet   . oxyCODONE-acetaminophen (PERCOCET) 5-325mg  per tablet     Imaging:  Results for orders placed or performed during the hospital encounter of 12/09/21 (from the past 72 hour(s))   XR SHOULDER RIGHT     Status: None    Narrative    Suzette RUTH Mankowski  Female, 87 years old.    XR SHOULDER RIGHT performed on  12/09/2021 11:42 AM.    REASON FOR EXAM:  R shoulder pain, hx of rheumatoid arthritis    TECHNIQUE: 3 views/3 images submitted for interpretation.    COMPARISON:  Shoulder imaging 06/15/2021.    FINDINGS:  Marked chronic degenerative changes of the right shoulder are again noted. There is also marked elevation of the right humeral head similar prior exam. Redemonstration of moderate degenerative changes of the acromioclavicular joint with osteophyte formation along the inferior aspect. Well-corticated fracture versus degenerative changes of the acromium laterally is noted. There is also moderate soft tissue swelling about the shoulder which was seen on prior exam. No acute abnormality in the right lung with dual lead pacer partially included in the infiltrate view.      Impression    Marked degenerative changes of the right shoulder with surrounding soft tissue swelling grossly similar to prior exam. There is also persistent high riding humeral head compatible with chronic rotator cuff insufficiency.      Radiologist location ID: [id number]       Therapy/Procedures/Course/MDM:    Patient stable throughout stay in the ED. Pain improved minimally with  morphine, later gave percocet. BP elevated initially which improved with home as well as pain meds. Pt admits that her BP is typically difficult to control when pain is severe. Denied CP, SOB, dizziness, HA. Advised she contact Dr Manson Passey to discuss steroid injections.   Medical Decision Making  Chronic right shoulder pain: chronic illness or injury with exacerbation, progression, or side effects of treatment  Hypertension, unspecified type: chronic illness or injury with exacerbation, progression, or side effects of treatment  Rheumatoid arthritis flare (CMS HCC): acute illness or injury  Amount and/or Complexity of Data Reviewed  Radiology: ordered. Decision-making details documented in ED Course.      Risk  Prescription drug management.  Parenteral controlled  substances.        ED Course as of 12/09/21 1452   Sat Dec 09, 2021   1335 Pain improved some after morphine, but then ramped back up. Gave percocet and home BP meds as pt forgot them this morning.    1419 Pt moving arm a little better after pain meds. BP still elevated      Impression:    Clinical Impression   Chronic right shoulder pain (Primary)   Rheumatoid arthritis flare (CMS HCC)   Hypertension, unspecified type     Disposition:    Discharged     Following the above history, physical exam, and studies, the patient was deemed stable and suitable for discharge.    It was advised that the patient return to the ED with any new, concerning or worsening symptoms and follow up as directed.    Return precautions reviewed.    The patient verbalized understanding of all instructions, had no further questions or concerns, and agreed to plan.      Follow Up:    Clemon Chambers, MD  [address]  [place] [address]  [phone]    Call in 3 days  ER follow up    [facility] [place], [place]  [address]  [place] [address]  [phone]      ER follow up    Future Appointments   Date Time Provider Department Center   12/25/2021 12:30 PM Zenaida Deed, MD [facility]   01/26/2022 10:30 AM Clemon Chambers, MD [facility]   02/01/2022 10:00 AM Hill-Reinert, Jeanice Lim DO [facility]        Prescriptions:      Current Discharge Medication List      START taking these medications.      Details   HYDROcodone-acetaminophen 5-325 mg Tablet  Commonly known as: NORCO   1 Tablet, Oral, EVERY 8 HOURS PRN  Qty: 8 Tablet  Refills: 0        CONTINUE these medications - NO CHANGES were made during your visit.      Details   amiodarone 200 mg Tablet  Commonly known as: PACERONE   100 mg, Oral, DAILY  Qty: 45 Tablet  Refills: 3     amLODIPine 5 mg Tablet  Commonly known as: NORVASC   5 mg, Oral, DAILY  Qty: 90 Tablet  Refills: 4     atorvastatin 20 mg Tablet  Commonly known as: LIPITOR   TAKE ONE TABLET BY MOUTH DAILY  Qty: 90  Tablet  Refills: 3     calcitrioL 0.25 mcg Capsule  Commonly known as: ROCALTROL   TAKE ONE CAPSULE BY MOUTH DAILY  Qty: 90 Capsule  Refills: 3     clopidogreL 75 mg Tablet  Commonly known as: PLAVIX  TAKE ONE TABLET BY MOUTH DAILY  Qty: 90 Tablet  Refills: 3     Creon 36,000-114,000- 180,000 unit Capsule, Delayed Release(E.C.)  Generic drug: lipase-protease-amylase   2 Capsules, 3 TIMES DAILY WITH MEALS, Additional two capsules twice with largest snacks of the day. (Five times a day total)  Refills: 0     Dexlansoprazole 60 mg Cap, Delayed Rel., Multiphasic  Commonly known as: DEXILANT   60 mg, Oral, DAILY  Qty: 90 Capsule  Refills: 0     ergocalciferol (vitamin D2) 1,250 mcg (50,000 unit) Capsule  Commonly known as: DRISDOL   50,000 Units, Oral, EVERY 7 DAYS, Takes on Fridays  Refills: 0     ezetimibe 10 mg Tablet  Commonly known as: ZETIA   TAKE ONE TABLET BY MOUTH DAILY  Qty: 90 Tablet  Refills: 3     gabapentin 100 mg Capsule  Commonly known as: NEURONTIN   100 mg, Oral, 2 TIMES DAILY  Qty: 180 Capsule  Refills: 1     gemfibroziL 600 mg Tablet  Commonly known as: LOPID   No dose, route, or frequency recorded.  Refills: 0     losartan 25 mg Tablet  Commonly known as: COZAAR   25 mg, Oral, DAILY  Refills: 0     methotrexate 2.5 mg tablet   2.5 mg, Oral, EVERY 7 DAYS, Take 4 Tablets  Refills: 0     * metoprolol succinate 100 mg Tablet Sustained Release 24 hr  Commonly known as: TOPROL-XL   100 mg, Oral, DAILY  Qty: 90 Tablet  Refills: 4     * metoprolol succinate 50 mg Tablet Sustained Release 24 hr  Commonly known as: TOPROL-XL   TAKE ONE TABLET BY MOUTH DAILY  Qty: 90 Tablet  Refills: 0         * This list has 2 medication(s) that are the same as other medications prescribed for you. Read the directions carefully, and ask your doctor or other care provider to review them with you.                Attestation:     The co-signing Dr was present in the ED and available for consultation and did participate in the  care of the patient.     This note was partly generated using a voice recognition software; please excuse any grammatical errors.       Almond Lint, PA-C  12/09/2021, 11:29

## 2021-12-09 NOTE — ED Nurses Note (Signed)
Patient manual BP still high; N. Niese notified.

## 2021-12-09 NOTE — Discharge Instructions (Signed)
Take all medications as prescribed. Caution advised as narcotic pain meds can cause drowsiness, impaired ability and fall risk is increased. Follow up with Dr Manson Passey next week. Call PCP to discuss elevated BP. Return to ED with any worsening of symptoms.

## 2021-12-10 ENCOUNTER — Telehealth (HOSPITAL_COMMUNITY): Payer: Self-pay

## 2021-12-10 NOTE — Progress Notes (Signed)
Post Ed Follow-Up    Post ED Follow-Up:   Document completed and/or attempted interactive contact(s) after transition to home after emergency department stay.:   Transition Facility and relevant Date:   Discharge Date: 12/09/21  Discharge from [facility] Emergency Department?: Yes  Discharge Facility: [facility]  Contacted by: London Pepper RN  Contact method: Patient/Caregiver Telephone  Contact completed: 12/10/2021 10:48 AM  Was the AVS reviewed with patient?: Yes  How is the patient recovering?: No better or worse  Medications prescribed: Yes  Were they obtained?: Yes  Interventions: provided patient education, patient provided Nurse Navigator contact number, availability, and services available  Pt to call Dr.Brown tomorrow to schedule shoulder injection. Pt states her BP was elevated because she had not taken her BP meds yesterday and she was in severe pain. She will follow up with PCP.        London Pepper, RN  12/10/2021, 10:53

## 2021-12-12 ENCOUNTER — Telehealth (INDEPENDENT_AMBULATORY_CARE_PROVIDER_SITE_OTHER): Payer: Self-pay | Admitting: Internal Medicine

## 2021-12-12 NOTE — Telephone Encounter (Signed)
Post Ed Follow-Up    Post ED Follow-Up:   Document completed and/or attempted interactive contact(s) after transition to home after emergency department stay.:   Transition Facility and relevant Date:   Discharge Date: 12/09/21  Discharge from [facility] Emergency Department?: Yes  Discharge Facility: [facility]  Contacted by: tc, lpn  Contact method: Patient/Caregiver Telephone  Contact completed: 12/12/2021  2:26 PM  MyChart message sent?: No  Was the AVS reviewed with patient?: Yes  How is the patient recovering?: No better or worse  Medications prescribed: Yes  Were they obtained?: Yes  Theresa Greene is requesting a pain medication like Norco to help with her shoulder pain, she she does not get to see Dr. Manson Passey until June 1st and is afraid to take what the ED prescribed her.

## 2021-12-12 NOTE — Telephone Encounter (Signed)
Theresa Greene is requesting a pain medication like Norco to help with her shoulder pain, she  does not get to see Dr. Manson Passey until June 1st and is afraid to take what the ED prescribed her. I advised her should would most likely need an appt. And that you was going on vacation soon. Do you want me to get her on for after you're back.

## 2021-12-14 ENCOUNTER — Other Ambulatory Visit (INDEPENDENT_AMBULATORY_CARE_PROVIDER_SITE_OTHER): Payer: Self-pay | Admitting: Internal Medicine

## 2021-12-20 ENCOUNTER — Other Ambulatory Visit (INDEPENDENT_AMBULATORY_CARE_PROVIDER_SITE_OTHER): Payer: Self-pay | Admitting: Internal Medicine

## 2021-12-21 MED ORDER — LOSARTAN 25 MG TABLET
25.0000 mg | ORAL_TABLET | Freq: Every day | ORAL | 1 refills | Status: DC
Start: 2021-12-21 — End: 2022-01-16

## 2021-12-21 NOTE — Telephone Encounter (Signed)
This medication was decreased to 25 mg while she was in the hospital. Call and verify that she is taking the 25 mg and not the 50 mg and see if that is what she needs the refill on please.

## 2021-12-21 NOTE — Addendum Note (Signed)
Addended by: Clydia Llano on: 12/21/2021 04:03 PM     Modules accepted: Orders

## 2021-12-25 ENCOUNTER — Encounter (INDEPENDENT_AMBULATORY_CARE_PROVIDER_SITE_OTHER): Payer: Self-pay | Admitting: CARDIOVASCULAR DISEASE

## 2021-12-25 ENCOUNTER — Ambulatory Visit (INDEPENDENT_AMBULATORY_CARE_PROVIDER_SITE_OTHER): Payer: Medicare Other | Admitting: CARDIOVASCULAR DISEASE

## 2021-12-25 ENCOUNTER — Other Ambulatory Visit: Payer: Self-pay

## 2021-12-25 VITALS — BP 180/90 | HR 84 | Ht 66.0 in | Wt 110.2 lb

## 2021-12-25 DIAGNOSIS — I480 Paroxysmal atrial fibrillation: Secondary | ICD-10-CM

## 2021-12-25 DIAGNOSIS — N183 Chronic kidney disease, stage 3 unspecified (CMS HCC): Secondary | ICD-10-CM

## 2021-12-25 DIAGNOSIS — R079 Chest pain, unspecified: Secondary | ICD-10-CM

## 2021-12-25 DIAGNOSIS — Z950 Presence of cardiac pacemaker: Secondary | ICD-10-CM

## 2021-12-25 DIAGNOSIS — N182 Chronic kidney disease, stage 2 (mild): Secondary | ICD-10-CM

## 2021-12-25 DIAGNOSIS — E782 Mixed hyperlipidemia: Secondary | ICD-10-CM

## 2021-12-25 DIAGNOSIS — I25119 Atherosclerotic heart disease of native coronary artery with unspecified angina pectoris: Secondary | ICD-10-CM | POA: Insufficient documentation

## 2021-12-25 DIAGNOSIS — J449 Chronic obstructive pulmonary disease, unspecified: Secondary | ICD-10-CM

## 2021-12-25 DIAGNOSIS — Z9861 Coronary angioplasty status: Secondary | ICD-10-CM

## 2021-12-25 MED ORDER — METOPROLOL SUCCINATE ER 25 MG TABLET,EXTENDED RELEASE 24 HR
25.0000 mg | ORAL_TABLET | Freq: Every day | ORAL | 4 refills | Status: DC
Start: 2021-12-25 — End: 2022-01-16

## 2021-12-25 MED ORDER — NITROGLYCERIN 0.4 MG SUBLINGUAL TABLET
0.4000 mg | SUBLINGUAL_TABLET | SUBLINGUAL | 5 refills | Status: DC | PRN
Start: 2021-12-25 — End: 2023-01-31

## 2021-12-25 MED ORDER — NITROGLYCERIN 0.4 MG SUBLINGUAL TABLET
0.4000 mg | SUBLINGUAL_TABLET | SUBLINGUAL | 5 refills | Status: DC | PRN
Start: 2021-12-25 — End: 2021-12-25

## 2021-12-25 NOTE — Addendum Note (Signed)
Addended by: Maryanna Shape on: 12/25/2021 02:51 PM     Modules accepted: Orders

## 2021-12-25 NOTE — Progress Notes (Addendum)
CARDIOLOGY [facility]  [address]  [place] [address]  [facility]     Name: Theresa Greene MRN:  [id number]   Date: 12/25/2021 Age: 87 y.o.       Follow up Progress Note  PCP: [facility], DO    HPI:  Theresa Greene is a 87 y.o. year old female who presents to the clinic for  follow-up of atrial fibrillation.  She has been having intermittent episodes of chest pain.  They are associated with exertion.  She has history of paroxysmal atrial fibrillation and has a history of gastrointestinal bleed, and she cannot take any anticoagulation because of recurrent gastrointestinal bleeds.  She has history of hypertension, hyperlipidemia, and had a permanent pacemaker placed in the past.  She was supposed to get a watchman procedure in the past.  She has history of chronic kidney disease stage II. The patient's previous cardiac catheterization showed 90% stenosis of the mid RCA for which she underwent angioplasty in June 2007.  She is on amiodarone 200 mg half-tablet once a day.  She was supposed to undergo watchman procedure in the past.  The patient had a right femur fracture in the past.  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 She has an appointment to see the electrophysiologist in the past.  CARDIAC HISTORY:     Allergies   Allergen Reactions   . Iodinated Contrast Media Anaphylaxis   . Iv Contrast Anaphylaxis     makese her serverly sick   . Etodolac NO Steroids unless approved by Attending Physician   . Symbicort [Budesonide-Formoterol]    . Codeine  Other Adverse Reaction (Add comment) and Nausea/ Vomiting     Stomach pain  Stomach pain       Social History     Tobacco Use   Smoking Status Never   Smokeless Tobacco Never       Problem List        Cardiovascular System    Hyperlipidemia    Cardiac pacemaker in situ    HTN (hypertension)    History of cardiac pacemaker    CAD S/P percutaneous coronary  angioplasty    Paroxysmal atrial fibrillation (CMS HCC)       Respiratory    Chronic obstructive pulmonary disease, unspecified COPD type (CMS HCC)       Neurologic    Neuropathy (CMS HCC)    History of CVA (cerebrovascular accident)       Nephrology    Stage 3 chronic kidney disease (CMS HCC)       Digestive    PUD (peptic ulcer disease)    Gastroesophageal reflux disease       Endocrine    Failure to thrive (child)    Moderate protein-calorie malnutrition (CMS HCC)    Vitamin D deficiency       Hematologic/Lymphatic    Iron deficiency anemia    Rheumatoid arthritis (CMS HCC)       Musculoskeletal    Osteoporosis    Charcot's joint of foot, left       Current Medical History:  I have comprehensively reviewed the records of their past medical history, family history, social history, along with medication history and they are updated per HPI.    Review of Systems:  General: No fever, No chills  Neck: No pain or swelling  HEENT: No dizziness  Cardiac: As noted in HPI  Pulmonary: As noted in HPI  Abdomen: No pain or change in bowel  GU:  No hematuria    Physical Exam:   There were no vitals taken for this visit.      General: Normal.  Skin: Normal.  Eyes: Normal.  HENT: Normal.  Respiratory: Normal.  Cardiovascular: Normal.  GI: Normal.  Extremities:  No edema of the lower extremities.   Musculoskeletal:  Normal.  Neurologic:  Normal.  Psychiatric: Normal.    Wt Readings from Last 3 Encounters:   11/24/21 48.5 kg (107 lb)   10/31/21 48.9 kg (107 lb 12.8 oz)   10/27/21 50.8 kg (112 lb)       Current Medications:   Current Outpatient Medications   Medication Sig Dispense Refill   . amiodarone (PACERONE) 200 mg Oral Tablet Take 0.5 Tablets (100 mg total) by mouth Once a day for 90 days 45 Tablet 3   . amLODIPine (NORVASC) 5 mg Oral Tablet Take 1 Tablet (5 mg total) by mouth Once a day 90 Tablet 4   . atorvastatin (LIPITOR) 20 mg Oral Tablet TAKE ONE TABLET BY MOUTH DAILY 90 Tablet 3   . calcitrioL (ROCALTROL) 0.25 mcg  Oral Capsule TAKE ONE CAPSULE BY MOUTH DAILY 90 Capsule 3   . clopidogreL (PLAVIX) 75 mg Oral Tablet TAKE ONE TABLET BY MOUTH DAILY 90 Tablet 3   . CREON 36,000-114,000- 180,000 unit Oral Capsule, Delayed Release(E.C.) 2 Capsules Three times daily with meals Additional two capsules twice with largest snacks of the day. (Five times a day total)     . Dexlansoprazole (DEXILANT) 60 mg Oral Cap, Delayed Rel., Multiphasic Take 1 Capsule (60 mg total) by mouth Once a day for 90 days 90 Capsule 0   . ergocalciferol, vitamin D2, (DRISDOL) 1,250 mcg (50,000 unit) Oral Capsule Take 1 Capsule (50,000 Units total) by mouth Every 7 days Takes on Fridays     . ezetimibe (ZETIA) 10 mg Oral Tablet TAKE ONE TABLET BY MOUTH DAILY 90 Tablet 3   . gabapentin (NEURONTIN) 100 mg Oral Capsule Take 1 Capsule (100 mg total) by mouth Twice daily 180 Capsule 1   . gemfibroziL (LOPID) 600 mg Oral Tablet      . HYDROcodone-acetaminophen (NORCO) 5-325 mg Oral Tablet Take 1 Tablet by mouth Every 8 hours as needed for Pain 8 Tablet 0   . losartan (COZAAR) 25 mg Oral Tablet Take 1 Tablet (25 mg total) by mouth Once a day for 90 days 90 Tablet 1   . methotrexate 2.5 mg Oral tablet Take 1 Tablet (2.5 mg total) by mouth Every 7 days Take 4 Tablets     . metoprolol succinate (TOPROL-XL) 100 mg Oral Tablet Sustained Release 24 hr Take 1 Tablet (100 mg total) by mouth Once a day 90 Tablet 4   . metoprolol succinate (TOPROL-XL) 50 mg Oral Tablet Sustained Release 24 hr TAKE ONE TABLET BY MOUTH DAILY 90 Tablet 0   . sodium bicarbonate 650 mg Oral Tablet TAKE ONE TABLET BY MOUTH TWICE A DAY 180 Tablet 3     No current facility-administered medications for this visit.         Orders:  No orders of the defined types were placed in this encounter.    There are no discontinued medications.    ASSESSMENT/PLAN:   No diagnosis found.    1.  Coronary artery disease- the patient is having intermittent episodes of chest pain.  I had a long discussion with the patient  about having a stress test and  she wants to think about it.  If she changes her mind we can proceed with a nuclear stress test to see if there is any evidence of myocardial ischemia.  I will change the beta-blocker to the long-acting drug.  Will continue nitroglycerin sublingual.  Follow-up in 6 months if there is no change in the symptoms.      2.  Paroxysmal atrial fibrillation.  The patient wants to think about a Watchman device.  She is not on any anticoagulation because of recurrent gastrointestinal bleeding in the past.      Follow Up:    No follow-ups on file.     Zenaida Deed, MD,12/25/2021,07:53    [facility]  Cardiology   [facility]    A portion of this documentation may have been generated using [facility] voice recognition software and may contain syntax/voice recognition errors.

## 2021-12-25 NOTE — Addendum Note (Signed)
Addended by: Sharon Seller on: 12/25/2021 04:11 PM     Modules accepted: Orders

## 2021-12-26 ENCOUNTER — Other Ambulatory Visit (INDEPENDENT_AMBULATORY_CARE_PROVIDER_SITE_OTHER): Payer: Self-pay | Admitting: CARDIOVASCULAR DISEASE

## 2021-12-26 ENCOUNTER — Telehealth (INDEPENDENT_AMBULATORY_CARE_PROVIDER_SITE_OTHER): Payer: Self-pay

## 2021-12-26 NOTE — Telephone Encounter (Signed)
Spoke with patient's daughter, [name].     Patient's medication list at visit yesterday was incorrect. She has been taking Toprol XL however she was unsure on the dose (either 50 mg or 100 mg)    Instructed patient to go back to 50 mg daily and check BP 1 hour after taking morning medications. If BP is >140/90 consistently, we will increase dose to 100 mg daily.     Additionally, patient continues to take Amiodarone 100 mg daily - med list to be updated.

## 2022-01-11 ENCOUNTER — Other Ambulatory Visit: Payer: Self-pay

## 2022-01-11 ENCOUNTER — Inpatient Hospital Stay (HOSPITAL_BASED_OUTPATIENT_CLINIC_OR_DEPARTMENT_OTHER)
Admission: RE | Admit: 2022-01-11 | Discharge: 2022-01-11 | Disposition: A | Discharge status: Home / Self Care | Payer: Medicare Other | Source: Ambulatory Visit | Attending: Orthopaedic Surgery | Admitting: Orthopaedic Surgery

## 2022-01-11 ENCOUNTER — Encounter (INDEPENDENT_AMBULATORY_CARE_PROVIDER_SITE_OTHER): Payer: Self-pay | Admitting: Orthopaedic Surgery

## 2022-01-11 ENCOUNTER — Ambulatory Visit: Payer: Medicare Other | Attending: Orthopaedic Surgery | Admitting: Orthopaedic Surgery

## 2022-01-11 VITALS — BP 188/92 | HR 69 | Temp 98.3°F | Resp 18 | Ht 66.0 in | Wt 110.0 lb

## 2022-01-11 DIAGNOSIS — S32591D Other specified fracture of right pubis, subsequent encounter for fracture with routine healing: Secondary | ICD-10-CM | POA: Insufficient documentation

## 2022-01-11 DIAGNOSIS — S46011A Strain of muscle(s) and tendon(s) of the rotator cuff of right shoulder, initial encounter: Secondary | ICD-10-CM | POA: Insufficient documentation

## 2022-01-11 DIAGNOSIS — S72011D Unspecified intracapsular fracture of right femur, subsequent encounter for closed fracture with routine healing: Secondary | ICD-10-CM | POA: Insufficient documentation

## 2022-01-11 DIAGNOSIS — M25511 Pain in right shoulder: Secondary | ICD-10-CM | POA: Insufficient documentation

## 2022-01-11 DIAGNOSIS — S82034D Nondisplaced transverse fracture of right patella, subsequent encounter for closed fracture with routine healing: Secondary | ICD-10-CM | POA: Insufficient documentation

## 2022-01-11 DIAGNOSIS — M069 Rheumatoid arthritis, unspecified: Secondary | ICD-10-CM | POA: Insufficient documentation

## 2022-01-11 DIAGNOSIS — M1611 Unilateral primary osteoarthritis, right hip: Secondary | ICD-10-CM | POA: Insufficient documentation

## 2022-01-11 DIAGNOSIS — M0600 Rheumatoid arthritis without rheumatoid factor, unspecified site: Secondary | ICD-10-CM | POA: Insufficient documentation

## 2022-01-11 DIAGNOSIS — S32511D Fracture of superior rim of right pubis, subsequent encounter for fracture with routine healing: Secondary | ICD-10-CM

## 2022-01-11 DIAGNOSIS — Z9889 Other specified postprocedural states: Secondary | ICD-10-CM | POA: Insufficient documentation

## 2022-01-11 DIAGNOSIS — M75101 Unspecified rotator cuff tear or rupture of right shoulder, not specified as traumatic: Secondary | ICD-10-CM | POA: Insufficient documentation

## 2022-01-11 DIAGNOSIS — M25512 Pain in left shoulder: Secondary | ICD-10-CM | POA: Insufficient documentation

## 2022-01-11 DIAGNOSIS — M12811 Other specific arthropathies, not elsewhere classified, right shoulder: Secondary | ICD-10-CM | POA: Insufficient documentation

## 2022-01-11 MED ORDER — LIDOCAINE HCL 10 MG/ML (1 %) INJECTION SOLUTION
4.0000 mL | Freq: Once | INTRAMUSCULAR | Status: AC
Start: 2022-01-11 — End: 2022-01-11
  Administered 2022-01-11: 40 mg via INTRAMUSCULAR

## 2022-01-11 MED ORDER — TRIAMCINOLONE ACETONIDE 40 MG/ML SUSPENSION FOR INJECTION
40.0000 mg | INTRAMUSCULAR | Status: AC
Start: 2022-01-11 — End: 2022-01-11
  Administered 2022-01-11: 40 mg via INTRA_ARTICULAR

## 2022-01-11 NOTE — H&P (Signed)
[facility]  Department of Orthopedics  [address] [place] [address]  Phone [phone]      Date of Appointment: 01/11/2022    Chief Complaint: right shoulder pain    History of Present Illness:  Theresa Greene is a 87 y.o. female who had fallen 2-13 and sustained rt sup and inf pubic rami fxs, non displaced rt subcap fem neck fx around helical blade from prior fixation for IT hip fx and was treated nonoperatively by myself after being consulted during hospitalization of pt.  Also with signs of distal patellar fx during follow up and had reported no further knee pain at last visit with xrays showing signs healing patellar fx, mainly on lateral view as patella not well visualized with prior TKA in place.  Pt had asked about possible injs for her shoulders at last visit where she has chronic pain and reported having been on oral steroids x3 over winter.  Following appt, went to ER in Apr for rt shoulder pain which had become fairly severe.  Xrays rt shoulder showing anchors x3 c/w prior RC surgery, humeral head elevation and GH OA, all c/w RC arthropathy.  Pt reports that rt shoulder bothers her the most and has hard time using arm for any overhead activity.  Struggles with left shoulder overhead but able to move it better than right.  Xrays left showing single metallic anchor but with normal HH alignment and no signs advanced GH OA like in right.  Pt presents with her husband and using rolling walker.  Denies any further rt knee pain and no xrays of it obtained today.  Rt pubic rami xrays do show further healing on xrays today with fem neck also healing.  Shows some mod sev rt hip degen changes too but does not have any RLE complaints today.  Does report some left knee pain and weakness LLE.  Had prior TKA elsewhere and left THA as well and will need to seek eval LLE symptoms elsewhere if symptoms continue.  Potential treatments shoulder pain discussed and pt would like to try rt shoulder  cortisone inj today.  Info also provided on reverse shoulder replacement which would be definitive tx for rt shoulder if symptoms persist.  Does have reported allergy to Symbicort but pt unable to remember reaction to med.  Has been on oral steroids without problems recently.  Doesn't remember knee cort injs prior to replacement but likely that she did undergo them.  Does see rheumatology and reports having 3 forms of arthritis.  Thinks she had shoulder surgery in past by Dr Erick Alley for at least one of her shoulders.   Attempted to obtain past med record of care for shoulders but unable to obtain.        Past Medical History:   Diagnosis Date   . Arthritis    . Asthma    . Back problem    . CAD S/P percutaneous coronary angioplasty 2010    Pt reports 5 years ago   . Charcot's joint of foot, left 08/06/2014   . Colon polyp    . CVA (cerebrovascular accident) (CMS HCC) 1995    no residual deficits   . Deaf    . Diverticulitis of colon    . Gastroesophageal reflux disease 08/06/2014   . GI bleed    . H/O hearing loss    . History of cardiac pacemaker 08/06/2014   . HTN (hypertension)    . Hyperlipidemia 12/25/2019   .  Muscle weakness    . Neuropathy (CMS HCC)    . Pacemaker    . Paroxysmal atrial fibrillation (CMS HCC) 08/06/2014   . PUD (peptic ulcer disease) 08/06/2014    C/b GIB Spring 2015   . Rheumatoid arthritis (CMS HCC) 08/06/2014   . Stage 3 chronic kidney disease (CMS HCC) 12/25/2019    Last Assessment & Plan:  Renal function stable, at baseline Stable electrolytes; restart sodium bicarb 650 mg BID  Continue renal protective ARB Discussed CKD risk factor modifications, including tight BP/glucose control, low protein diet, and avoidance of NSAIDs   . Stented coronary artery    . Vitamin D deficiency 12/25/2019   . Wears glasses            Past Surgical History:   Procedure Laterality Date   . ANKLE SURGERY Left     repair with metal   . CATARACT EXTRACTION      unsure which eye but thinks she only had one eye  done   . CORONARY ARTERY ANGIOPLASTY  2011   . ESOPHAGOGASTRODUODENOSCOPY     . HIP SURGERY Right     repair with metal   . HX BACK SURGERY      lower back x 3 no metal   . HX COLONOSCOPY     . HX HIP REPLACEMENT Left    . HX NISSEN FUNDOPLICATION  2002    then three others to repair problems   . HX OPEN CHOLECYSTECTOMY  1962    with common duxt exploration   . HX PACEMAKER INSERTION  2019   . HX SHOULDER SURGERY Bilateral     scopes   . KNEE ARTHROPLASTY Bilateral    . ORIF DISTAL FEMUR FRACTURE Right    . 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 PANCREATIC PSEUDOCYST DRAINAGE  1971   . TOTAL ABDOMINAL HYSTERECTOMY             Current Outpatient Medications   Medication Sig   . amLODIPine (NORVASC) 5 mg Oral Tablet Take 1 Tablet (5 mg total) by mouth Once a day   . atorvastatin (LIPITOR) 20 mg Oral Tablet TAKE ONE TABLET BY MOUTH DAILY (Patient taking differently: Take 2 Tablets (40 mg total) by mouth Every evening)   . clopidogreL (PLAVIX) 75 mg Oral Tablet TAKE ONE TABLET BY MOUTH DAILY   . CREON 36,000-114,000- 180,000 unit Oral Capsule, Delayed Release(E.C.) 1 Capsule Once a day Additional two capsules twice with largest snacks of the day. (Five times a day total)   . Dexlansoprazole (DEXILANT) 60 mg Oral Cap, Delayed Rel., Multiphasic Take 1 Capsule (60 mg total) by mouth Once a day for 90 days   . dicyclomine (BENTYL) 20 mg Oral Tablet Take 1 Tablet (20 mg total) by mouth Three times a day   . ergocalciferol, vitamin D2, (DRISDOL) 1,250 mcg (50,000 unit) Oral Capsule Take 1 Capsule (50,000 Units total) by mouth Every 7 days Takes on Fridays   . ezetimibe (ZETIA) 10 mg Oral Tablet TAKE ONE TABLET BY MOUTH DAILY   . gabapentin (NEURONTIN) 100 mg Oral Capsule Take 1 Capsule (100 mg total) by mouth Twice daily   . gemfibroziL (LOPID) 600 mg Oral Tablet Take 1 Tablet (600 mg total) by mouth Twice a day before meals   . losartan (COZAAR) 25 mg Oral Tablet Take 1  Tablet (25 mg total) by mouth Once a day  for 90 days (Patient taking differently: Take 2 Tablets (50 mg total) by mouth Once a day)   . methotrexate 2.5 mg Oral tablet    . metoprolol succinate (TOPROL-XL) 25 mg Oral Tablet Sustained Release 24 hr Take 1 Tablet (25 mg total) by mouth Once a day   . nitroGLYCERIN (NITROSTAT) 0.4 mg Sublingual Tablet, Sublingual Place 1 Tablet (0.4 mg total) under the tongue Every 5 minutes as needed for Chest pain (after three doses, call ER if pain still persists) for 3 doses over 15 minutes   . sodium bicarbonate 650 mg Oral Tablet TAKE ONE TABLET BY MOUTH TWICE A DAY       Allergies   Allergen Reactions   . Iodinated Contrast Media Anaphylaxis   . Iv Contrast Anaphylaxis     makese her serverly sick   . Etodolac NO Steroids unless approved by Attending Physician   . Symbicort [Budesonide-Formoterol]    . Codeine  Other Adverse Reaction (Add comment) and Nausea/ Vomiting     Stomach pain  Stomach pain         Family Medical History:     Problem Relation (Age of Onset)    Brain cancer Mother    Ovarian Cancer Mother    Pancreatic Cancer Father            Social History     Tobacco Use   . Smoking status: Never   . Smokeless tobacco: Never   Substance Use Topics   . Alcohol use: Never   . Drug use: Never              04/05/2020     2:43 PM 01/11/2022     2:34 PM   Review of Systems   Constitutional: negative negative   Eyes: positive for positive for       + contacts/glasses contacts/glasses   Ears, nose, mouth, and throat: positive for negative       + hearing loss    Respiratory: negative negative   Cardiovascular: negative negative   Gastrointestinal: negative negative   Genitourinary: negative negative   Integument/breast: negative negative   Hematologic/lymphatic: negative negative   Musculoskeletal: positive for negative       + joint aches;stiff joints    Neurological: positive for negative       + difficulty walking    Behavioral/Psych: negative negative   Endocrine: negative  negative   Allergic/Immunologic: negative negative   All other systems: Negative except as noted in HPI          Physical Exam:  BP (!) 188/92   Pulse 69   Temp 36.8 C (98.3 F) (Thermal Scan)   Resp 18   Ht 1.676 m (5\' 6" )   Wt 49.9 kg (110 lb)   SpO2 96%   BMI 17.75 kg/m       Body mass index is 17.75 kg/m.   General:  No acute distress.  Alert and oriented.  Skin:  Warm, pink, no lesions, no edema.  Head:  Atraumatic, normocephalic.  Eyes:  Pupils equal.  Ocular motor intact.  No scleral icterus or conjunctiva.  ENT:  No tracheal deviation.  Oral mucosa moist/pink.  Normal nose/ears.  Chest:  Symmetrical excursion.  No dyspnea.  No audible wheezes.  Cardiovascular:  Regular palpable distal pulses.  Brisk papillary refill.  Abdomen:  Soft.  Non-distended.  Neuro:  UE and LE sensation and motor grossly intact.  No tremor    Shoulder Exam:  Skin:  Normal appearance, mild edema  Atrophy: pos diffuse bilat shoulders  Winging:  neg  Sulcus: neg  Tenderness to palpation:  Mild diffuse rt  Active Range of Motion (affected to contralateral):   Forward Elevation:  40/90  Abduction:    External Rotation in Abd:    External Rotation at Side:  40/60  Internal Rotation:  Side/L3  Crepitus:  Pos rt > lt shoulder  Strength:  4/5 FE, ABD and ER, right > left    Rt Knee: non-tender, 0 to 120 stable  Rt Hip: min tender passive ROM      Radiographic/Other Studies: reviewed    Xray: Rt shoulder- from ER, GH mod sev degen, HH elevation, metallic anchor x3, signs RC arthropathy, pacemaker in place, Lt Shoulder- anchor x1 GT, no HH elevation, AP pelvis- new, further healing rt pubic rami fxs and rt subcap fem neck fx, rt hip sclerosis, fem head subchondral cysts, narrowing, mod sev degen, TFN in place, left THA in place    Assessment:  (M75.101,  M12.811) Right rotator cuff tear arthropathy  (primary encounter diagnosis)    (M25.511,  M25.512) Bilateral shoulder pain, unspecified chronicity    (Z98.890) History of repair of  rotator cuff    (S32.591D) Fracture of multiple pubic rami with routine healing, right    (S72.011D) Closed subcapital fracture of neck of right femur with routine healing    (S82.034D) Closed nondisplaced transverse fracture of right patella with routine healing, subsequent encounter    (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  Risks, benefits, alternatives and potential complications of these were discussed.  Findings of any radiographic studies or laboratory evaluation were discussed with the patient.  The  patient was given the opportunity to ask questions which were  then answered to the best of my ability.  The patient was informed of any follow up care or further studies needed and showed understanding of these.  These actions were discussed with the patient.    Activity/Work Modification: as tolerated  DME/Cast/Splint: walker for ambulation  Additional Medical Record needed for review: prior Ortho if available  Medications: reviewed, Vit D and Ca for fx healing  Physical/Occupational Therapy: none  Radiographic Studies: reviewed, discussed, shown  Therapeutic or Diagnostic Injections: pt desires to proceed with rt shoulder SA cort inj  Surgical Intervention: rt shoulder reverse shoulder possible option, AAOS info provided  Referral: discussed possible referral to Dr Myriam Forehand if rt shoulder symptoms persistsed  Follow up: 4 wks for eval after rt shoulder inj, no further pelvis xrays           Orders Placed This Encounter   . lidocaine 1% injection   . triamcinolone acetonide (KENALOG-40) 40 mg/mL injection          Clemon Chambers, MD  01/11/2022, 17:55

## 2022-01-11 NOTE — Nursing Note (Signed)
Travel Screening     Question Response    In the last 10 days, have you been in contact with someone who was confirmed or suspected to have Coronavirus/COVID-19? No / Unsure    Have you had a COVID-19 viral test in the last 10 days? No    Do you have any of the following new or worsening symptoms? None of these    Have you traveled internationally or domestically in the last month? No      Travel History   Travel since 12/11/21    No documented travel since 12/11/21

## 2022-01-11 NOTE — Procedures (Signed)
[facility]  [address]  [place] [address]  Operated by [facility]  Procedure Note    Name: Donnae Donna MRN:  [id number]   Date: 01/11/2022 Age: 87 y.o.       Joint Asp/Inj  Performed by: Clemon Chambers, MD  Authorized by: Clemon Chambers, MD     Consent:     Consent obtained:  Verbal and written (The patient elected to proceed with a cortisone injection.)    Consent given by:  Patient    Risks discussed:  Bleeding, infection and pain (Risks, benefits, alternatives and potential complications of these were discussed.)    Alternatives discussed:  Alternative treatment and observation  Universal protocol:     Procedure explained and questions answered to patient or proxy's satisfaction: yes      Test results available and properly labeled: yes      Imaging studies available: yes      Immediately prior to procedure, a time out was called: yes      Patient identity confirmed:  Verbally with patient  Location:     Location:  Shoulder    Shoulder:  R subacromial bursa  Anesthesia (see MAR for exact dosages):     Anesthesia method:  Topical application    Topical anesthesia: Ethyl Chloride.  Procedure details:     Preparation: Patient was prepped and draped in usual sterile fashion      Approach:  Posterior    Steroid injected: yes (Kenalog 40mg  1% lidocaine 69mL)    Post-procedure details:     Dressing:  Adhesive bandage    Patient tolerance of procedure:  Tolerated well, no immediate complications        Clemon Chambers, MD

## 2022-01-16 ENCOUNTER — Other Ambulatory Visit (INDEPENDENT_AMBULATORY_CARE_PROVIDER_SITE_OTHER): Payer: Self-pay | Admitting: CARDIOVASCULAR DISEASE

## 2022-01-16 ENCOUNTER — Encounter (INDEPENDENT_AMBULATORY_CARE_PROVIDER_SITE_OTHER): Payer: Self-pay | Admitting: Internal Medicine

## 2022-01-16 ENCOUNTER — Encounter (INDEPENDENT_AMBULATORY_CARE_PROVIDER_SITE_OTHER): Payer: Self-pay | Admitting: CARDIOVASCULAR DISEASE

## 2022-01-16 MED ORDER — LOSARTAN 50 MG TABLET
50.0000 mg | ORAL_TABLET | Freq: Two times a day (BID) | ORAL | 5 refills | Status: DC
Start: 2022-01-16 — End: 2022-02-01

## 2022-01-16 NOTE — Nursing Note (Addendum)
Pt's daughter, Theresa Greene, called to update medicines. Current BP meds: Toprol XR 100 mg, Norvasc 5mg , and cozaar 25 mg bid.     Current vitals: SBP 140-160s and DBP 78-85. [name] stated she has orthostatic hypotension. She was worried about increasing any meds for this reason. She stated that these are the normal pressures, and that they are generally pleased with these numbers. I cautioned against anything higher than [id number] systolic. And reminded of advice from [place], [place], to consider changing BP meds if consistently higher than 140/90.     [name] advised increasing to losartan 50 mg bid. [name] was hesitant d/t [place] and only person to monitor at home would be her father, pt's spouse. I did explain [name]'s other recommendation of adding PRN clonidine instead. She stated that pt has tried clonidine in the past but then becomes more wary of scheduled medications and their effectiveness/need. [name] stated they will try to start on the increased losartan dosage. She will follow up with PCP on 22nd.     [name], MA

## 2022-01-23 ENCOUNTER — Other Ambulatory Visit: Payer: Self-pay

## 2022-01-23 ENCOUNTER — Other Ambulatory Visit: Payer: Medicare Other | Attending: NURSE PRACTITIONER

## 2022-01-23 DIAGNOSIS — M150 Primary generalized (osteo)arthritis: Secondary | ICD-10-CM | POA: Insufficient documentation

## 2022-01-23 DIAGNOSIS — N183 Chronic kidney disease, stage 3 unspecified: Secondary | ICD-10-CM | POA: Insufficient documentation

## 2022-01-23 DIAGNOSIS — I129 Hypertensive chronic kidney disease with stage 1 through stage 4 chronic kidney disease, or unspecified chronic kidney disease: Secondary | ICD-10-CM | POA: Insufficient documentation

## 2022-01-23 LAB — CBC WITH DIFF
BASOPHIL #: <0.1 10*3/uL (ref <=0.20)
BASOPHIL %: 1 %
EOSINOPHIL #: 0.2 10*3/uL (ref <=0.50)
EOSINOPHIL %: 3 %
HCT: 38 % (ref 34.8–46.0)
HGB: 12.3 g/dL (ref 11.5–16.0)
IMMATURE GRANULOCYTE #: <0.1 10*3/uL (ref <0.10)
IMMATURE GRANULOCYTE %: 1 % (ref 0–1)
LYMPHOCYTE #: 2.31 10*3/uL (ref 1.00–4.80)
LYMPHOCYTE %: 32 %
MCH: 30.4 pg (ref 26.0–32.0)
MCHC: 32.4 g/dL (ref 31.0–35.5)
MCV: 94.1 fL (ref 78.0–100.0)
MONOCYTE #: 0.54 10*3/uL (ref 0.20–1.10)
MONOCYTE %: 7 %
MPV: 10.5 fL (ref 8.7–12.5)
NEUTROPHIL #: 4.18 10*3/uL (ref 1.50–7.70)
NEUTROPHIL %: 56 %
PLATELETS: 345 10*3/uL (ref 150–400)
RBC: 4.04 10*6/uL (ref 3.85–5.22)
RDW-CV: 16.7 % — ABNORMAL HIGH (ref 11.5–15.5)
WBC: 7.3 10*3/uL (ref 3.7–11.0)

## 2022-01-23 LAB — COMPREHENSIVE METABOLIC PANEL, NON-FASTING
ALBUMIN: 3.4 g/dL (ref 3.4–4.8)
ALKALINE PHOSPHATASE: 142 U/L (ref 55–145)
ALT (SGPT): 9 U/L (ref 8–22)
ANION GAP: 14 mmol/L — ABNORMAL HIGH (ref 4–13)
AST (SGOT): 18 U/L (ref 8–45)
BILIRUBIN TOTAL: 0.5 mg/dL (ref 0.3–1.3)
BUN/CREA RATIO: 28 — ABNORMAL HIGH (ref 6–22)
BUN: 38 mg/dL — ABNORMAL HIGH (ref 8–25)
CALCIUM: 8.9 mg/dL (ref 8.8–10.2)
CHLORIDE: 109 mmol/L (ref 96–111)
CO2 TOTAL: 20 mmol/L — ABNORMAL LOW (ref 23–31)
CREATININE: 1.36 mg/dL — ABNORMAL HIGH (ref 0.60–1.05)
ESTIMATED GFR: 38 mL/min/BSA — ABNORMAL LOW (ref >=60)
GLUCOSE: 93 mg/dL (ref 65–125)
POTASSIUM: 4.1 mmol/L (ref 3.5–5.1)
PROTEIN TOTAL: 7.1 g/dL (ref 6.0–8.0)
SODIUM: 143 mmol/L (ref 136–145)

## 2022-01-23 LAB — RENAL FUNCTION PANEL
ALBUMIN: 3.4 g/dL (ref 3.4–4.8)
ANION GAP: 14 mmol/L — ABNORMAL HIGH (ref 4–13)
BUN/CREA RATIO: 28 — ABNORMAL HIGH (ref 6–22)
BUN: 38 mg/dL — ABNORMAL HIGH (ref 8–25)
CALCIUM: 8.9 mg/dL (ref 8.8–10.2)
CHLORIDE: 109 mmol/L (ref 96–111)
CO2 TOTAL: 20 mmol/L — ABNORMAL LOW (ref 23–31)
CREATININE: 1.36 mg/dL — ABNORMAL HIGH (ref 0.60–1.05)
ESTIMATED GFR: 38 mL/min/BSA — ABNORMAL LOW (ref >=60)
GLUCOSE: 93 mg/dL (ref 65–125)
PHOSPHORUS: 3.7 mg/dL (ref 2.3–4.0)
POTASSIUM: 4.1 mmol/L (ref 3.5–5.1)
SODIUM: 143 mmol/L (ref 136–145)

## 2022-01-23 LAB — VITAMIN D 25 TOTAL: VITAMIN D 25, TOTAL: 28.9 ng/mL — ABNORMAL LOW (ref 30.0–100.0)

## 2022-01-23 LAB — PARATHYROID HORMONE (PTH): PTH: 123.8 pg/mL — ABNORMAL HIGH (ref 8.5–77.0)

## 2022-01-26 ENCOUNTER — Ambulatory Visit (INDEPENDENT_AMBULATORY_CARE_PROVIDER_SITE_OTHER): Payer: Self-pay | Admitting: Orthopaedic Surgery

## 2022-02-01 ENCOUNTER — Ambulatory Visit: Payer: Medicare Other | Attending: Internal Medicine | Admitting: Internal Medicine

## 2022-02-01 ENCOUNTER — Other Ambulatory Visit: Payer: Self-pay

## 2022-02-01 ENCOUNTER — Telehealth (INDEPENDENT_AMBULATORY_CARE_PROVIDER_SITE_OTHER): Payer: Self-pay | Admitting: Internal Medicine

## 2022-02-01 ENCOUNTER — Encounter (INDEPENDENT_AMBULATORY_CARE_PROVIDER_SITE_OTHER): Payer: Self-pay | Admitting: Internal Medicine

## 2022-02-01 VITALS — BP 186/86 | HR 76 | Temp 96.9°F | Wt 111.0 lb

## 2022-02-01 DIAGNOSIS — N183 Chronic kidney disease, stage 3 unspecified: Secondary | ICD-10-CM | POA: Insufficient documentation

## 2022-02-01 DIAGNOSIS — K8689 Other specified diseases of pancreas: Secondary | ICD-10-CM | POA: Insufficient documentation

## 2022-02-01 DIAGNOSIS — R54 Age-related physical debility: Secondary | ICD-10-CM | POA: Insufficient documentation

## 2022-02-01 DIAGNOSIS — M069 Rheumatoid arthritis, unspecified: Secondary | ICD-10-CM | POA: Insufficient documentation

## 2022-02-01 DIAGNOSIS — E782 Mixed hyperlipidemia: Secondary | ICD-10-CM | POA: Insufficient documentation

## 2022-02-01 DIAGNOSIS — I129 Hypertensive chronic kidney disease with stage 1 through stage 4 chronic kidney disease, or unspecified chronic kidney disease: Secondary | ICD-10-CM | POA: Insufficient documentation

## 2022-02-01 DIAGNOSIS — I480 Paroxysmal atrial fibrillation: Secondary | ICD-10-CM | POA: Insufficient documentation

## 2022-02-01 DIAGNOSIS — R296 Repeated falls: Secondary | ICD-10-CM | POA: Insufficient documentation

## 2022-02-01 DIAGNOSIS — Z79899 Other long term (current) drug therapy: Secondary | ICD-10-CM | POA: Insufficient documentation

## 2022-02-01 DIAGNOSIS — I10 Essential (primary) hypertension: Secondary | ICD-10-CM | POA: Insufficient documentation

## 2022-02-01 MED ORDER — LOSARTAN 25 MG TABLET
37.5000 mg | ORAL_TABLET | Freq: Every day | ORAL | 1 refills | Status: DC
Start: 2022-02-01 — End: 2022-02-08

## 2022-02-01 MED ORDER — ERGOCALCIFEROL (VITAMIN D2) 1,250 MCG (50,000 UNIT) CAPSULE
50000.0000 [IU] | ORAL_CAPSULE | ORAL | 3 refills | Status: DC
Start: 2022-02-01 — End: 2022-05-04

## 2022-02-01 MED ORDER — CALCITRIOL 0.5 MCG CAPSULE
0.5000 ug | ORAL_CAPSULE | Freq: Every day | ORAL | 3 refills | Status: DC
Start: 2022-02-01 — End: 2022-06-15

## 2022-02-01 NOTE — Telephone Encounter (Signed)
Pts daughter called in with questions reguarding pts losartan dosing. She states that last time pt was here they were supposed to change the dose to 50 mg twice daily (from my understanding of how she explained it) and now she is wondering why the dosage is being decreased or if they are misunderstanding. Wanting clarification on dosage per daughter, told her i would have to send a message to find out

## 2022-02-01 NOTE — Progress Notes (Unsigned)
[facility]  PEDIATRICS/INTERNAL MEDICINE, [facility] CLINIC  [address]  [place] [address]  [phone]  02/06/2022       Name:  Theresa Greene MRN: [id number]   Date:    02/01/2022 Age:  87 y.o.       Reason for Visit:  Chief Complaint   Patient presents with   . Follow Up     Has been doing okay, is now having to urinate more frequently, like an hour after she lays down for bed, is keeping her awake and not getting the rest she needs. Wants to discuss recent labs she had done      . Hypertension     Blood pressure has been high during dr appointments recently but at home it seems to be fine but upon movement and activity it is higher, BP in office on first reading was about 186/86 but they state it was like this at dr appt yesterday and they recehecked it at the end of appt and it had gone down since the first         Subjective:   Theresa Greene is a 87 y.o. female who comes in today for follow up on high blood pressure and chronic kidney disease. Her daughter is with her today and she has a lot of things to discuss. She has many consultants and they want to cut back on these to simplify things, as it is expensive to go to and from and because it is difficult to get her out and about.     She saw Dr. Darron Doom a few weeks ago and her blood pressure was elevated in the 180's range. They wanted to increase her losartan to 50 mg twice daily or add in PRN clonidine. She did not really want to do either of these options without discussing with me. She has been taking her blood pressure at home and it has been consistently running in the 140-170 range over the past week or so. She has not been having any symptoms from this like she did previously. Her daughter says she does eat a lot of foods that have salt in them, but doesn't necessarily add a lot of salt to her diet.     She is on Creon for pancreatic insufficiency and she out of the medication on  Monday. Her daughter went to pick it up, and was told that it would be >$700.00. After talking with the insurance company, it turns out that she is in the donut hole. They suggested that she try Reglan or another nausea medication. Ultimately, there isn't a good replacement for this, but when she doesn't have it she gets a lot of nausea and abdominal pain and ultimately this is when she loses a lot of weight. We will reach out to population health to see if they can find any assistance for her.     She complains that she is up and down all night urinating. They had previously felt that this was related to a UTI. However, her daughter has been staying with her and she says she is up and down to the bathroom frequently during the day as well. She denies any dysuria, hematuria, or back pain. She says she just dribbles. They are concerned that this is related to her elevated PTH levels. Her daughter feels like she has too many consultants that are not really doing a lot of her. She would like to  stop seeing some of them if possible. One of those is nephrology. She is asking that I adjust the calcitriol today and I am agreeable to do this.     She is following with Dr. Caralyn Guile for rheumatology. She is having a lot of trouble with her right shoulder. They tried injections by Dr. Manson Passey, but that didn't really help. She is following with another orthopedist and will need a joint replacement. Dr. Caralyn Guile has started her on MTX and will follow her up to see how it is working. They are asking that if after that appointment she is stable, if I can manage the MTX, which I am also agreeable to do. She also follows with Dr. Darron Doom for Cardiology and Dr. Gerrie Nordmann for EP. They do not feel like they get much out of these appointments either. I recommended that they stay on board with Cardiology because if she needs shoulder replacement, she will require cardiac clearance. She would also like to stop seeing the Pulmonologist because all they  do are lung function testing for amiodarone therapy.     Her daughter says that she had been complaining for a long time about a gnat buzzing around her right eye. She recently saw Dr. Janee Morn for an eye exam and was found to have macular degeneration. He referred her to [facility] and she is having injections completed in that eye. She will do the 4th injection next week.     Past Medical History:    Past Medical History:   Diagnosis Date   . Arthritis    . Asthma    . Back problem    . CAD S/P percutaneous coronary angioplasty 2010    Pt reports 5 years ago   . Charcot's joint of foot, left 08/06/2014   . Colon polyp    . CVA (cerebrovascular accident) (CMS HCC) 1995    no residual deficits   . Deaf    . Diverticulitis of colon    . Gastroesophageal reflux disease 08/06/2014   . GI bleed    . H/O hearing loss    . History of cardiac pacemaker 08/06/2014   . HTN (hypertension)    . Hyperlipidemia 12/25/2019   . Muscle weakness    . Neuropathy (CMS HCC)    . Pacemaker    . Paroxysmal atrial fibrillation (CMS HCC) 08/06/2014   . PUD (peptic ulcer disease) 08/06/2014    C/b GIB Spring 2015   . Rheumatoid arthritis (CMS HCC) 08/06/2014   . Stage 3 chronic kidney disease (CMS HCC) 12/25/2019    Last Assessment & Plan:  Renal function stable, at baseline Stable electrolytes; restart sodium bicarb 650 mg BID  Continue renal protective ARB Discussed CKD risk factor modifications, including tight BP/glucose control, low protein diet, and avoidance of NSAIDs   . Stented coronary artery    . Vitamin D deficiency 12/25/2019   . Wears glasses       Past Surgical History:    Past Surgical History:   Procedure Laterality Date   . ANKLE SURGERY Left     repair with metal   . CATARACT EXTRACTION      unsure which eye but thinks she only had one eye done   . CORONARY ARTERY ANGIOPLASTY  2011   . ESOPHAGOGASTRODUODENOSCOPY     . HIP SURGERY Right     repair with metal   . HX BACK SURGERY      lower back x 3 no metal   .  HX  COLONOSCOPY     . HX HIP REPLACEMENT Left    . HX NISSEN FUNDOPLICATION  2002    then three others to repair problems   . HX OPEN CHOLECYSTECTOMY  1962    with common duxt exploration   . HX PACEMAKER INSERTION  2019   . HX SHOULDER SURGERY Bilateral     scopes   . KNEE ARTHROPLASTY Bilateral    . OPEN REDUCTION INTERNAL FIXATION FRACTURE FEMUR Right 05/07/2020    Performed by Jeanell Sparrow, MD at [facility] OR [facility]TH   . ORIF DISTAL FEMUR FRACTURE Right    . 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 PANCREATIC PSEUDOCYST DRAINAGE  1971   . REPAIR ECTROPION Right 09/21/2021    Performed by Pierce Crane, MD at [facility] OR MAIN   . REPAIR PTOSIS Left 09/21/2021    Performed by Pierce Crane, MD at [facility] OR MAIN   . TOTAL ABDOMINAL HYSTERECTOMY       Family History:    Family Medical History:     Problem Relation (Age of Onset)    Brain cancer Mother    Ovarian Cancer Mother    Pancreatic Cancer Father           Social History:   Social History     Tobacco Use   . Smoking status: Never   . Smokeless tobacco: Never   Substance Use Topics   . Alcohol use: Never      Social History     Substance and Sexual Activity   Drug Use Never      Social History     Social History Narrative    Lives at home with husband. 2 grown daughters - an Charity fundraiser and a Mining engineer. She did Building control surveyor for 23 years.         Allergies:    Allergies   Allergen Reactions   . Iodinated Contrast Media Anaphylaxis   . Iv Contrast Anaphylaxis     makese her serverly sick   . Etodolac NO Steroids unless approved by Attending Physician   . Symbicort [Budesonide-Formoterol]    . Codeine  Other Adverse Reaction (Add comment) and Nausea/ Vomiting     Stomach pain  Stomach pain        Home Medications:    Current Outpatient Medications:   .  acetaminophen (TYLENOL 8 HOUR) 650 mg Oral Tablet Sustained Release, Take 2 Tablets (1,300 mg total) by mouth Twice per day as needed for Pain, Disp: , Rfl:   .   amLODIPine (NORVASC) 5 mg Oral Tablet, Take 1 Tablet (5 mg total) by mouth Once a day, Disp: 90 Tablet, Rfl: 4  .  atorvastatin (LIPITOR) 20 mg Oral Tablet, Take 1 Tablet (20 mg total) by mouth Every evening, Disp: , Rfl:   .  calcitrioL (ROCALTROL) 0.5 mcg Oral Capsule, Take 1 Capsule (0.5 mcg total) by mouth Once a day for 90 days, Disp: 90 Capsule, Rfl: 3  .  celecoxib (CELEBREX) 200 mg Oral Capsule, Take 1 Capsule (200 mg total) by mouth Once per day as needed for Pain, Disp: , Rfl:   .  clopidogreL (PLAVIX) 75 mg Oral Tablet, TAKE ONE TABLET BY MOUTH DAILY, Disp: 90 Tablet, Rfl: 3  .  CREON 36,000-114,000- 180,000 unit Oral Capsule, Delayed Release(E.C.), 1 Capsule Twice daily, Disp: , Rfl:   .  Dexlansoprazole (  DEXILANT) 60 mg Oral Cap, Delayed Rel., Multiphasic, Take 1 Capsule (60 mg total) by mouth Once a day for 90 days, Disp: 90 Capsule, Rfl: 0  .  ergocalciferol, vitamin D2, (DRISDOL) 1,250 mcg (50,000 unit) Oral Capsule, Take 1 Capsule (50,000 Units total) by mouth Every 7 days for 90 days Takes on Fridays, Disp: 12 Capsule, Rfl: 3  .  ezetimibe (ZETIA) 10 mg Oral Tablet, TAKE ONE TABLET BY MOUTH DAILY, Disp: 90 Tablet, Rfl: 3  .  folic acid (FOLVITE) 1 mg Oral Tablet, Take 1 Tablet (1 mg total) by mouth Once a day, Disp: , Rfl:   .  gabapentin (NEURONTIN) 100 mg Oral Capsule, Take 1 Capsule (100 mg total) by mouth Twice daily, Disp: 180 Capsule, Rfl: 1  .  gemfibroziL (LOPID) 600 mg Oral Tablet, Take 1 Tablet (600 mg total) by mouth Twice a day before meals, Disp: , Rfl:   .  losartan (COZAAR) 25 mg Oral Tablet, Take 1.5 Tablets (37.5 mg total) by mouth Once a day for 90 days, Disp: 135 Tablet, Rfl: 1  .  methotrexate 2.5 mg Oral tablet, Take 1 Tablet (2.5 mg total) by mouth Every 7 days, Disp: , Rfl:   .  metoprolol succinate (TOPROL-XL) 100 mg Oral Tablet Sustained Release 24 hr, Take 1 Tablet (100 mg total) by mouth Once a day, Disp: 90 Tablet, Rfl: 4  .  nitroGLYCERIN (NITROSTAT) 0.4 mg Sublingual  Tablet, Sublingual, Place 1 Tablet (0.4 mg total) under the tongue Every 5 minutes as needed for Chest pain (after three doses, call ER if pain still persists) for 3 doses over 15 minutes, Disp: 25 Tablet, Rfl: 5  .  sodium bicarbonate 650 mg Oral Tablet, TAKE ONE TABLET BY MOUTH TWICE A DAY, Disp: 180 Tablet, Rfl: 3       Review of Systems:   All systems have been reviewed and found to be negative except for as stated above in the history of present illness.     Physical Examination:   Vitals: BP (!) 186/86   Pulse 76   Temp 36.1 C (96.9 F)   Wt 50.3 kg (111 lb)   SpO2 99%   BMI 17.92 kg/m     General:  No apparent distress.  Family at bedside.  Interactive.  Frail.   HEENT:  Atraumatic, normocephalic.  Sclera and conjunctiva are clear. Nares patent without lesion or discharge. Mucous membranes moist. Tongue protrudes midline. No tonsillar erythema or exudates.  Neck:  Supple. Trachea is midline. No JVD. No adenopathy. No thyromegaly. No carotid bruits.  Respiratory: No respiratory distress. Lungs clear to auscultation bilaterally. No wheezes, rales, or rhonchi.  Cardiovascular: Regular rate and rhythm. S1, S2 present. No murmurs, rubs, or gallops. Pulses are +2 and equal in all extremities.  Extremities:  No clubbing, cyanosis or edema.     Assessment:   (I10) Primary hypertension  (primary encounter diagnosis)    (N18.30) Stage 3 chronic kidney disease (CMS HCC)  Plan: CBC, COMPREHENSIVE METABOLIC PANEL,         NON-FASTING, LIPID PANEL, VITAMIN D 25 TOTAL,         HGA1C (HEMOGLOBIN A1C WITH EST AVG GLUCOSE),         PARATHYROID HORMONE (PTH), PHOSPHORUS    (E78.2) Mixed hyperlipidemia    (M06.9) Rheumatoid arthritis, involving unspecified site, unspecified whether rheumatoid factor present (CMS HCC)    (I48.0) Paroxysmal atrial fibrillation (CMS HCC)  Plan: COMPREHENSIVE METABOLIC PANEL, NON-FASTING,  THYROID STIMULATING HORMONE WITH FREE T4 REFLEX    (R54) Frailty    (R29.6) Frequent  falls    (K86.89) Pancreatic insufficiency  Plan: REFER TO POPULATION HEALTH PHARMACISTS (AMB)     Plan:   It is difficult to get a good balance in [place] because her dietary intake of sodium fluctuates. We do not want her to run so high that she is symptomatic, but we also do not want her to be orthostatic. Continue on Toprol XL 100 mg oral daily, amlodipine  oral daily, and increase losartan to 37.5mg  oral twice daily. Monitor closely.   Baseline creatinine has been about 1.0-1.4. Monitor closely. Avoid nephrotoxic agents. Check all laboratories prior to next appointment.   Controlled on Lipitor 20 mg oral nightly. Continue treatment. Check FLP and CMP prior to next appointment.   Following with Dr. Caralyn Guile, but may transition. Started on MTX 2.5mg  oral weekly with daily folic acid. Also on Celebrex 200 mg oral daily PRN with Tylenol PRN.   Currently NSR. Continue on Toprol XL  oral daily. No AC secondary to fall risk. Monitor electrolytes.   Has completed rehab and PT. Use cane as needed.   Refer to population health to see if there are resources available to try and help with obtaining her Creon.     Patient was counseled on diagnosis, follow-up, and side effect of medications.  Reasons for follow up in clinic or emergency department were discussed.    Orders:    Orders Placed This Encounter   . CBC   . COMPREHENSIVE METABOLIC PANEL, NON-FASTING   . THYROID STIMULATING HORMONE WITH FREE T4 REFLEX   . LIPID PANEL   . VITAMIN D 25 TOTAL   . HGA1C (HEMOGLOBIN A1C WITH EST AVG GLUCOSE)   . PARATHYROID HORMONE (PTH)   . PHOSPHORUS   . REFER TO POPULATION HEALTH PHARMACISTS (AMB)   . losartan (COZAAR) 25 mg Oral Tablet   . ergocalciferol, vitamin D2, (DRISDOL) 1,250 mcg (50,000 unit) Oral Capsule   . calcitrioL (ROCALTROL) 0.5 mcg Oral Capsule      Referrals:   Orders Placed This Encounter   Procedures   . REFER TO POPULATION HEALTH PHARMACISTS (AMB)      Discharge Medication List:       Current Discharge  Medication List          Accurate as of February 01, 2022 11:59 PM. If you have any questions, ask your nurse or doctor.            CONTINUE these medications which have CHANGED during your visit.      Details   calcitrioL 0.5 mcg Capsule  Commonly known as: ROCALTROL  What changed:    medication strength   how much to take  Changed by: Jeanice Lim Hill-Reinert, DO   0.5 mcg, Oral, DAILY  Qty: 90 Capsule  Refills: 3     ergocalciferol (vitamin D2) 1,250 mcg (50,000 unit) Capsule  Commonly known as: DRISDOL  What changed: when to take this  Changed by: Ayren Zumbro Hill-Reinert, DO   50,000 Units, Oral, EVERY 7 DAYS, Takes on Fridays  Qty: 12 Capsule  Refills: 3     losartan 25 mg Tablet  Commonly known as: COZAAR  What changed:    medication strength   how much to take   when to take this  Changed by: Krithi Bray Hill-Reinert, DO   37.5 mg, Oral, DAILY  Qty: 135 Tablet  Refills: 1  CONTINUE these medications - NO CHANGES were made during your visit.      Details   amLODIPine 5 mg Tablet  Commonly known as: NORVASC   5 mg, Oral, DAILY  Qty: 90 Tablet  Refills: 4     atorvastatin 20 mg Tablet  Commonly known as: LIPITOR   20 mg, Oral, EVERY EVENING  Refills: 0     celecoxib 200 mg Capsule  Commonly known as: CeleBREX   200 mg, Oral, DAILY PRN  Refills: 0     clopidogreL 75 mg Tablet  Commonly known as: PLAVIX   TAKE ONE TABLET BY MOUTH DAILY  Qty: 90 Tablet  Refills: 3     Creon 36,000-114,000- 180,000 unit Capsule, Delayed Release(E.C.)  Generic drug: lipase-protease-amylase   1 Capsule, 2 TIMES DAILY  Refills: 0     Dexlansoprazole 60 mg Cap, Delayed Rel., Multiphasic  Commonly known as: DEXILANT   60 mg, Oral, DAILY  Qty: 90 Capsule  Refills: 0     ezetimibe 10 mg Tablet  Commonly known as: ZETIA   TAKE ONE TABLET BY MOUTH DAILY  Qty: 90 Tablet  Refills: 3     folic acid 1 mg Tablet  Commonly known as: FOLVITE   1 mg, Oral, DAILY  Refills: 0     gabapentin 100 mg Capsule  Commonly known as: NEURONTIN   100 mg, Oral, 2 TIMES  DAILY  Qty: 180 Capsule  Refills: 1     gemfibroziL 600 mg Tablet  Commonly known as: LOPID   600 mg, Oral, 2 TIMES DAILY BEFORE MEALS  Refills: 0     methotrexate 2.5 mg tablet   2.5 mg, Oral, EVERY 7 DAYS  Refills: 0     metoprolol succinate 100 mg Tablet Sustained Release 24 hr  Commonly known as: TOPROL-XL   100 mg, Oral, DAILY  Qty: 90 Tablet  Refills: 4     nitroGLYCERIN 0.4 mg Tablet, Sublingual  Commonly known as: NITROSTAT   0.4 mg, Sublingual, EVERY 5 MIN PRN, for 3 doses over 15 minutes  Qty: 25 Tablet  Refills: 5     sodium bicarbonate 650 mg Tablet   TAKE ONE TABLET BY MOUTH TWICE A DAY  Qty: 180 Tablet  Refills: 3     Tylenol 8 Hour 650 mg Tablet Sustained Release  Generic drug: acetaminophen   1,300 mg, Oral, 2 TIMES DAILY PRN  Refills: 0               Follow up: Return in about 3 months (around 05/04/2022) for HTN, CKD. Patient was advised to follow up sooner if issues arise.     Jupiter Boys Hill-Reinert, DO    This note was partially created using M*Modal fluency direct system (voice recognition software ) and is inherently subject to errors including those of syntax and "sound- alike" substitutions which may escape proofreading.  In such instances, original meaning may be extrapolated by contextual derivation.

## 2022-02-02 ENCOUNTER — Encounter (INDEPENDENT_AMBULATORY_CARE_PROVIDER_SITE_OTHER): Payer: Self-pay | Admitting: Internal Medicine

## 2022-02-02 ENCOUNTER — Other Ambulatory Visit (INDEPENDENT_AMBULATORY_CARE_PROVIDER_SITE_OTHER): Payer: Self-pay | Admitting: Pharmacist

## 2022-02-02 DIAGNOSIS — Z7189 Other specified counseling: Secondary | ICD-10-CM

## 2022-02-02 DIAGNOSIS — K8689 Other specified diseases of pancreas: Secondary | ICD-10-CM | POA: Insufficient documentation

## 2022-02-02 NOTE — Telephone Encounter (Signed)
They told me yesterday that she was taking losartan 25 mg twice daily. Her blood pressure has been elevated at our appointment as well as several others over the past few weeks and on her home readings. Since she did not increase it to 50 mg twice daily, I recommended that she do 1.5 tabs of the 25 mg twice daily so that we don't drop her blood pressure too low. If it is still running high on that, then we can discuss increasing it to the 50 mg twice daily.

## 2022-02-02 NOTE — Progress Notes (Signed)
Population Health    Reason for Encounter: Medication Cost Assistance    I received this referral to review cost assistance options for Theresa Greene's Creon. I am including the link for the myAbbVie assist program application.     Https://www.needymeds.org/papforms/myapae3848.pdf    I called the patient's daughter Theresa Greene, to explain my findings. She also had questions regarding the cost of Dexilant while the patient is in the donut hole. I will also link a patient assistance program for this medication. Another consideration could be an alterantive PPI. [name] states the patient has been on Protonix in the past and it could be reconsidered, but Prilosec or Nexium would not work for her. The cost difference on Medicare.gov would be Dexilant ($60-$90/month) vs. Protonix ($4-$5/month).     https://www.needymeds.org/papforms/takpae0165.pdf    Thank you for allowing me to care for this patient.  Please feel free to contact me with any questions or concerns.    Charlynn Court, PharmD  P: [phone] Ext. [phone]

## 2022-02-07 ENCOUNTER — Encounter (INDEPENDENT_AMBULATORY_CARE_PROVIDER_SITE_OTHER): Payer: Self-pay | Admitting: Internal Medicine

## 2022-02-07 DIAGNOSIS — R350 Frequency of micturition: Secondary | ICD-10-CM

## 2022-02-08 ENCOUNTER — Ambulatory Visit (INDEPENDENT_AMBULATORY_CARE_PROVIDER_SITE_OTHER): Payer: Self-pay | Admitting: Orthopaedic Surgery

## 2022-02-08 ENCOUNTER — Other Ambulatory Visit: Payer: Medicare Other | Attending: Internal Medicine

## 2022-02-08 ENCOUNTER — Encounter (INDEPENDENT_AMBULATORY_CARE_PROVIDER_SITE_OTHER): Payer: Self-pay | Admitting: Surgical

## 2022-02-08 ENCOUNTER — Other Ambulatory Visit: Payer: Self-pay

## 2022-02-08 ENCOUNTER — Encounter (INDEPENDENT_AMBULATORY_CARE_PROVIDER_SITE_OTHER): Payer: Self-pay | Admitting: Internal Medicine

## 2022-02-08 ENCOUNTER — Other Ambulatory Visit (INDEPENDENT_AMBULATORY_CARE_PROVIDER_SITE_OTHER): Payer: Self-pay | Admitting: Internal Medicine

## 2022-02-08 DIAGNOSIS — R350 Frequency of micturition: Secondary | ICD-10-CM | POA: Insufficient documentation

## 2022-02-08 DIAGNOSIS — Z1612 Extended spectrum beta lactamase (ESBL) resistance: Secondary | ICD-10-CM

## 2022-02-08 HISTORY — DX: Extended spectrum beta lactamase (ESBL) resistance: Z16.12

## 2022-02-08 LAB — URINALYSIS, MICROSCOPIC

## 2022-02-08 LAB — URINALYSIS, MACROSCOPIC
BILIRUBIN: NEGATIVE mg/dL
BLOOD: NEGATIVE mg/dL
GLUCOSE: NEGATIVE mg/dL
KETONES: NEGATIVE mg/dL
NITRITE: NEGATIVE
PH: 6.5 (ref 4.6–8.0)
PROTEIN: 100 mg/dL — AB
SPECIFIC GRAVITY: 1.02 (ref 1.005–1.030)
UROBILINOGEN: 0.2 mg/dL (ref 0.2–1.0)

## 2022-02-08 MED ORDER — AMOXICILLIN 500 MG-POTASSIUM CLAVULANATE 125 MG TABLET
1.0000 | ORAL_TABLET | Freq: Two times a day (BID) | ORAL | 0 refills | Status: AC
Start: 2022-02-08 — End: 2022-02-15

## 2022-02-08 MED ORDER — OXYBUTYNIN CHLORIDE ER 5 MG TABLET,EXTENDED RELEASE 24 HR
5.0000 mg | EXTENDED_RELEASE_TABLET | Freq: Every day | ORAL | 1 refills | Status: DC
Start: 2022-02-08 — End: 2022-04-06

## 2022-02-08 MED ORDER — LOSARTAN 50 MG TABLET
50.0000 mg | ORAL_TABLET | Freq: Two times a day (BID) | ORAL | 1 refills | Status: DC
Start: 2022-02-08 — End: 2022-08-14

## 2022-02-09 ENCOUNTER — Ambulatory Visit (INDEPENDENT_AMBULATORY_CARE_PROVIDER_SITE_OTHER): Payer: Self-pay | Admitting: Orthopaedic Surgery

## 2022-02-10 LAB — URINE CULTURE,ROUTINE: URINE CULTURE: >100000 — AB

## 2022-02-14 ENCOUNTER — Other Ambulatory Visit (INDEPENDENT_AMBULATORY_CARE_PROVIDER_SITE_OTHER): Payer: Self-pay | Admitting: Internal Medicine

## 2022-02-14 ENCOUNTER — Telehealth (INDEPENDENT_AMBULATORY_CARE_PROVIDER_SITE_OTHER): Payer: Self-pay | Admitting: Internal Medicine

## 2022-02-14 MED ORDER — CIPROFLOXACIN 500 MG TABLET
500.0000 mg | ORAL_TABLET | Freq: Every day | ORAL | 0 refills | Status: DC
Start: 2022-02-14 — End: 2022-03-22

## 2022-02-14 NOTE — Telephone Encounter (Signed)
Notified pt that new antibiotic was sent to pharmacy for her and to d/c current one and to start taking this instead, and to also let us know how she is doing because if this new one doesn't help she may need admitted for iv antibiotics instead. Pt verbalized understanding at this time and stated she got her first night of good sleep last night and is doing better

## 2022-02-14 NOTE — Telephone Encounter (Signed)
-----   Message from [facility], DO sent at 02/14/2022 12:56 PM EDT -----  Can you call and let Theresa Greene know that the antibiotic we sent in will not cover the bug that grew in her urine. We need to change it. Stop the amoxicillin and start Cipro 500 mg oral daily for 7 days. If she doesn't get better on this, she may need admitted to the hospital for IV antibiotics because the bacteria that she grew out is usually resistant to all oral antibiotics.

## 2022-03-01 NOTE — Telephone Encounter (Signed)
You helped Sanyia by filling out paper work for her medication they got contacted and told they need state license and expiration my abbvie assist is requesting it [phone] please let me know what i need to do  Thank you   Lupita Leash

## 2022-03-05 ENCOUNTER — Telehealth (INDEPENDENT_AMBULATORY_CARE_PROVIDER_SITE_OTHER): Payer: Self-pay | Admitting: Internal Medicine

## 2022-03-05 NOTE — Telephone Encounter (Signed)
Pt daughter called in and stated she has called in office and no one has ever got back to her nor have they provided the information available for her mother to get her med filled. Pt daughter, [name], stated in order for her mother to get one of her medications filled it calls for them to have providers expiration date of her license. She stated that this is the only thing holding her mother up from getting her medication and would like provider or her nurse to giver her a call back as soon as possible at [phone].    Comments added by Abagail Kitchens on 03/05/22 at 11:47.

## 2022-03-10 ENCOUNTER — Encounter (HOSPITAL_COMMUNITY): Payer: Self-pay

## 2022-03-10 ENCOUNTER — Emergency Department
Admission: EM | Admit: 2022-03-10 | Discharge: 2022-03-10 | Disposition: A | Discharge status: Home / Self Care | Payer: Medicare Other | Attending: Emergency Medicine | Admitting: Emergency Medicine

## 2022-03-10 ENCOUNTER — Other Ambulatory Visit: Payer: Self-pay

## 2022-03-10 DIAGNOSIS — S0501XA Injury of conjunctiva and corneal abrasion without foreign body, right eye, initial encounter: Secondary | ICD-10-CM | POA: Insufficient documentation

## 2022-03-10 MED ORDER — ERYTHROMYCIN 5 MG/GRAM (0.5 %) EYE OINTMENT
TOPICAL_OINTMENT | OPHTHALMIC | Status: AC
Start: 2022-03-10 — End: 2022-03-10
  Filled 2022-03-10: qty 3.5

## 2022-03-10 MED ORDER — FLUORESCEIN 0.6 MG EYE STRIPS
1.0000 | ORAL_STRIP | OPHTHALMIC | Status: AC
Start: 2022-03-10 — End: 2022-03-10
  Administered 2022-03-10: 1 via OPHTHALMIC
  Filled 2022-03-10: qty 1

## 2022-03-10 MED ORDER — KETOROLAC 0.5 % EYE DROPS
1.0000 [drp] | Freq: Four times a day (QID) | OPHTHALMIC | 0 refills | Status: DC
Start: 2022-03-10 — End: 2022-04-06

## 2022-03-10 MED ORDER — TETRACAINE HCL (PF) 0.5 % EYE DROPS
1.0000 [drp] | OPHTHALMIC | Status: AC
Start: 2022-03-10 — End: 2022-03-10
  Administered 2022-03-10: 1 [drp] via OPHTHALMIC
  Filled 2022-03-10: qty 80

## 2022-03-10 NOTE — Discharge Instructions (Addendum)
Use the Toradol drops every 6 hours to help with pain in the eye.    Use the erythromycin ointment times a day for the next 5 days to help with any infection.    If you have any worsening pain, worsening vision changes, redness, new concerns please return to the ED.  Otherwise, follow-up with your eye doctor early next week if not resolved.

## 2022-03-10 NOTE — ED Provider Notes (Signed)
[facility]  ED Primary Provider Note  History of Present Illness   Chief Complaint   Patient presents with    Eye Problem     Theresa Greene is a 87 y.o. female who had concerns including Eye Problem.  Arrival: The patient arrived by Car    Patient with 2 day history of right eye irritation and photosensitivity.  States it feels gradually as if there is something in it.  No known injury.  No rash, redness    Review of Systems   Pertinent positive and negative ROS as per HPI.  Historical Data   History Reviewed This Encounter: Medical History  Surgical History  Family History  Social History    Physical Exam   ED Triage Vitals [03/10/22 1932]   BP (Non-Invasive) (!) 208/103   Heart Rate 70   Respiratory Rate 16   Temperature 36.7 C (98.1 F)   SpO2 98 %   Weight 50.7 kg (111 lb 12.8 oz)   Height 1.676 m (5\' 6" )     Physical Exam  HENT:      Head: Normocephalic and atraumatic.   Eyes:      Comments: Pupils are equal and reactive bilaterally, extraocular motions are intact.  Some mild scleral injection in the right.  Patient states she has poor vision in that eye secondary to macular degeneration which is unchanged baseline.  Conjunctivae are normal   Pulmonary:      Effort: Pulmonary effort is normal. No respiratory distress.   Skin:     General: Skin is warm and dry.      Findings: No rash.   Neurological:      Mental Status: She is alert and oriented to person, place, and time.     Patient Data   Labs Ordered/Reviewed - No data to display  No orders to display     Medical Decision Making        Medical Decision Making  Staining of the eye reveals a 5 mm in any your uptake consistent with a faint corneal abrasion.  This does cross the visual axis.  She was given a dose erythromycin ointment here in the department and prescription for same at home.  Prescription for Toradol drops to help with pain.  Should follow-up with her regular eye doctor if not improving by  Monday.    Risk  Prescription drug management.             Medications Administered in the ED   tetracaine PF (PONTOCAINE) 0.5 % ophthalmic solution (1 Drop Right Eye Given 03/10/22 1925)   fluorescein (FLU-GLO) ophthalmic strip (1 Strip Right Eye Given 03/10/22 1925)   erythromycin [facility]) 0.5% ophthalmic ointment ( Right Eye Given 03/10/22 1936)     Clinical Impression   Right corneal abrasion, initial encounter (Primary)       Disposition: Discharged

## 2022-03-10 NOTE — ED Triage Notes (Signed)
Pt states has been seeing eye doctor for shots in right eye x 3. Has appt with dermatologist on Wed next week as has growth right temporal area. Has had basal cell carcinoma in area. Has appt with special dermatologist on Wed next week about growth. Pt states pain in right eye since yesterday. Has patch over right eye as cannot stand light.

## 2022-03-11 ENCOUNTER — Telehealth (HOSPITAL_COMMUNITY): Payer: Self-pay | Admitting: Internal Medicine

## 2022-03-11 NOTE — Telephone Encounter (Signed)
Post Ed Follow-Up    Post ED Follow-Up:   Document completed and/or attempted interactive contact(s) after transition to home after emergency department stay.:   Transition Facility and relevant Date:   Discharge Date: 03/10/22  Discharge from [facility] Emergency Department?: Yes  Discharge Facility: [facility]  Contacted by: Dinah Beers, RN  Contact method: Patient/Caregiver Telephone, MyChart Patient Portal  Contact completed: 03/11/2022 11:37 AM  MyChart message sent?: Yes  Was the AVS reviewed with patient?: Yes  How is the patient recovering?: Improving  Medications prescribed: Yes  Were they obtained?: Yes  Interventions: No needs identified, Provided patient education  I spoke with Janelle Floor and she states that she is feeling better today and that she rested well last night. She denies any new/worsening symptoms. I reviewed return to ED precautions. She has an appointment scheduled with specialist on Wednesday for recent issues with her right eye. Nurse Navigator toll free number, resources available, and hours of operation provided to patient and/or caregiver. I advised Eriyah to call providers or NN line for questions or concerns. Dinah Beers, RN         Dinah Beers, RN

## 2022-03-16 ENCOUNTER — Other Ambulatory Visit (INDEPENDENT_AMBULATORY_CARE_PROVIDER_SITE_OTHER): Payer: Self-pay | Admitting: Internal Medicine

## 2022-03-16 MED ORDER — SODIUM BICARBONATE 650 MG TABLET
650.0000 mg | ORAL_TABLET | Freq: Two times a day (BID) | ORAL | 3 refills | Status: DC
Start: 2022-03-16 — End: 2023-01-31

## 2022-03-16 NOTE — Telephone Encounter (Signed)
Mida called and said she is out of refills on sodium bicarbonate uses [facility] pharmacy  Thank you   Lupita Leash

## 2022-03-22 ENCOUNTER — Other Ambulatory Visit: Payer: Medicare Other | Attending: Internal Medicine

## 2022-03-22 ENCOUNTER — Telehealth (INDEPENDENT_AMBULATORY_CARE_PROVIDER_SITE_OTHER): Payer: Self-pay | Admitting: Internal Medicine

## 2022-03-22 ENCOUNTER — Other Ambulatory Visit: Payer: Self-pay

## 2022-03-22 ENCOUNTER — Other Ambulatory Visit (INDEPENDENT_AMBULATORY_CARE_PROVIDER_SITE_OTHER): Payer: Self-pay | Admitting: Internal Medicine

## 2022-03-22 DIAGNOSIS — R300 Dysuria: Secondary | ICD-10-CM | POA: Insufficient documentation

## 2022-03-22 LAB — URINALYSIS, MACROSCOPIC
BILIRUBIN: NEGATIVE mg/dL
GLUCOSE: NEGATIVE mg/dL
NITRITE: NEGATIVE
PH: 7 (ref 4.6–8.0)
PROTEIN: 100 mg/dL — AB
SPECIFIC GRAVITY: 1.02 (ref 1.005–1.030)
UROBILINOGEN: 0.2 mg/dL (ref 0.2–1.0)

## 2022-03-22 LAB — URINALYSIS, MICROSCOPIC: WBCS: >50 /hpf — AB

## 2022-03-22 MED ORDER — CIPROFLOXACIN 500 MG TABLET
500.0000 mg | ORAL_TABLET | Freq: Every day | ORAL | 0 refills | Status: DC
Start: 2022-03-22 — End: 2022-04-06

## 2022-03-22 NOTE — Telephone Encounter (Signed)
Theresa Greene called and said that she has been having trouble urinating. I asked her to go to walk in clinic to get tested but she wants it done here. I told her that I would send message to Dr. Jeanice Lim and see if she could order a urinalysis for her to have done at registration and call her back and let her know and Dr. Jeanice Lim will get the results and let her know something. Comments added by Merdis Delay on 03/22/22 at 08:18.

## 2022-03-22 NOTE — Telephone Encounter (Signed)
Called Windell Moulding and she said that she would come on down and get it done. Comments added by Merdis Delay on 03/22/22 at 09:27.

## 2022-03-22 NOTE — Telephone Encounter (Signed)
Order is in. Let her know that she needs to do it as early as possible because I am not here tomorrow and won't be back until Tuesday.

## 2022-03-23 ENCOUNTER — Telehealth (INDEPENDENT_AMBULATORY_CARE_PROVIDER_SITE_OTHER): Payer: Self-pay | Admitting: Internal Medicine

## 2022-03-23 NOTE — Telephone Encounter (Signed)
Notified pt of her results and that antibiotic was sent to the pharmacy, pt stated that she picked up yesterday and started medication then. She took her second dose this morning, so i notified her that if she is not better tomorrow 8/12 then we recommend she goes to the er because last UTI she had was resistant to nearly every oral antibiotic and this one might require iv antibiotics instead. Pt verbalized understanding at this time

## 2022-03-23 NOTE — Telephone Encounter (Signed)
-----   Message from [facility], DO sent at 03/22/2022 11:44 AM EDT -----  Call and let Theresa Greene know that she does have a UTI. I am sending in Cipro 500 mg oral daily for 7 days. If she doesn't get better in 48 hours, she needs to go to the Emergency Room because her last urine was resistant to nearly every oral antibiotic.

## 2022-03-24 LAB — URINE CULTURE,ROUTINE: URINE CULTURE: >100000 — AB

## 2022-03-27 ENCOUNTER — Telehealth (INDEPENDENT_AMBULATORY_CARE_PROVIDER_SITE_OTHER): Payer: Self-pay | Admitting: Internal Medicine

## 2022-03-27 ENCOUNTER — Encounter (INDEPENDENT_AMBULATORY_CARE_PROVIDER_SITE_OTHER): Payer: Self-pay | Admitting: CARDIOVASCULAR DISEASE

## 2022-03-27 NOTE — Telephone Encounter (Signed)
Patient called in asking if Dr,Holly wants her to have another urine test done since she has one day left of her antibiotic.        Theresa Greene

## 2022-03-29 ENCOUNTER — Telehealth (INDEPENDENT_AMBULATORY_CARE_PROVIDER_SITE_OTHER): Payer: Self-pay | Admitting: Internal Medicine

## 2022-03-29 DIAGNOSIS — R350 Frequency of micturition: Secondary | ICD-10-CM

## 2022-03-29 NOTE — Telephone Encounter (Signed)
Sent in repeat urinalysis. If she is not drinking enough fluids though, she probably isn't going to clear it. She grows out resistant organisms and at some point probably will not be able to be treated with oral antibiotics. Thus requiring hospitalization.

## 2022-03-29 NOTE — Telephone Encounter (Signed)
Daughter called in stating that Theresa Greene is still has the urine frequency and has not been sleeping well due to the frequency,the daughter believes that Theresa Greene is not clearing the UTI's at all.        Comments added by Neill Loft on 03/29/22 at 13:32.

## 2022-03-30 ENCOUNTER — Other Ambulatory Visit: Payer: Medicare Other | Attending: Internal Medicine

## 2022-03-30 ENCOUNTER — Other Ambulatory Visit: Payer: Self-pay

## 2022-03-30 ENCOUNTER — Telehealth (INDEPENDENT_AMBULATORY_CARE_PROVIDER_SITE_OTHER): Payer: Self-pay | Admitting: Internal Medicine

## 2022-03-30 DIAGNOSIS — R350 Frequency of micturition: Secondary | ICD-10-CM | POA: Insufficient documentation

## 2022-03-30 LAB — URINALYSIS, MICROSCOPIC

## 2022-03-30 LAB — URINALYSIS, MACROSCOPIC
BILIRUBIN: NEGATIVE mg/dL
BLOOD: NEGATIVE mg/dL
GLUCOSE: NEGATIVE mg/dL
KETONES: NEGATIVE mg/dL
LEUKOCYTES: NEGATIVE WBCs/uL
NITRITE: NEGATIVE
PH: 7 (ref 4.6–8.0)
PROTEIN: 30 mg/dL — AB
SPECIFIC GRAVITY: 1.02 (ref 1.005–1.030)
UROBILINOGEN: 0.2 mg/dL (ref 0.2–1.0)

## 2022-03-30 NOTE — Telephone Encounter (Signed)
-----   Message from [facility], DO sent at 03/30/2022 12:20 PM EDT -----  Let Janelle Floor know that the infection looks like it is gone on the urinalysis. Push fluids and if she gets worse over the weekend, go to the ED.

## 2022-03-30 NOTE — Telephone Encounter (Signed)
Notified pt that her urinalysis looks to be clear of infection now but she still needs to be pushing fluids and if she starts to feel bad again then go to the ED. Pt verbalized understanding and also expressed concerns that she never had these issues until she started wearing the depends, and that she is cleaning herself well as she can when she changes and not sitting in a wet depends. She just isn't sure what is causing these all of the sudden she states. Told pt i would express her concerns to the Dr. Rock Nephew states that other wise she is feeling better overall and had no other questions or concerns at this time.

## 2022-04-05 ENCOUNTER — Ambulatory Visit (INDEPENDENT_AMBULATORY_CARE_PROVIDER_SITE_OTHER): Payer: Self-pay | Admitting: Internal Medicine

## 2022-04-05 ENCOUNTER — Ambulatory Visit: Payer: Medicare Other | Attending: Internal Medicine | Admitting: Internal Medicine

## 2022-04-05 ENCOUNTER — Other Ambulatory Visit: Payer: Self-pay

## 2022-04-05 ENCOUNTER — Other Ambulatory Visit: Payer: Medicare Other | Attending: Internal Medicine

## 2022-04-05 VITALS — BP 160/86 | HR 71 | Temp 97.8°F | Ht 66.0 in | Wt 109.8 lb

## 2022-04-05 DIAGNOSIS — A499 Bacterial infection, unspecified: Secondary | ICD-10-CM | POA: Insufficient documentation

## 2022-04-05 DIAGNOSIS — R350 Frequency of micturition: Secondary | ICD-10-CM

## 2022-04-05 DIAGNOSIS — N390 Urinary tract infection, site not specified: Secondary | ICD-10-CM

## 2022-04-05 DIAGNOSIS — Z1612 Extended spectrum beta lactamase (ESBL) resistance: Secondary | ICD-10-CM | POA: Insufficient documentation

## 2022-04-05 LAB — URINALYSIS, MICROSCOPIC

## 2022-04-05 LAB — URINALYSIS, MACROSCOPIC
BILIRUBIN: NEGATIVE mg/dL
BLOOD: NEGATIVE mg/dL
GLUCOSE: NEGATIVE mg/dL
KETONES: NEGATIVE mg/dL
NITRITE: NEGATIVE
PH: 6.5 (ref 4.6–8.0)
PROTEIN: 30 mg/dL — AB
SPECIFIC GRAVITY: 1.015 (ref 1.005–1.030)
UROBILINOGEN: 0.2 mg/dL (ref 0.2–1.0)

## 2022-04-06 ENCOUNTER — Encounter (INDEPENDENT_AMBULATORY_CARE_PROVIDER_SITE_OTHER): Payer: Self-pay | Admitting: Internal Medicine

## 2022-04-06 MED ORDER — OXYBUTYNIN CHLORIDE ER 10 MG TABLET,EXTENDED RELEASE 24 HR
10.0000 mg | EXTENDED_RELEASE_TABLET | Freq: Every day | ORAL | 0 refills | Status: AC
Start: 2022-04-06 — End: 2022-07-05

## 2022-04-06 NOTE — Progress Notes (Signed)
See scanned progress note

## 2022-04-06 NOTE — Addendum Note (Signed)
Addended by: Clydia Llano on: 04/06/2022 06:44 AM     Modules accepted: Orders

## 2022-04-17 ENCOUNTER — Encounter (INDEPENDENT_AMBULATORY_CARE_PROVIDER_SITE_OTHER): Payer: Self-pay | Admitting: Internal Medicine

## 2022-04-17 ENCOUNTER — Other Ambulatory Visit (INDEPENDENT_AMBULATORY_CARE_PROVIDER_SITE_OTHER): Payer: Self-pay | Admitting: Internal Medicine

## 2022-04-17 MED ORDER — METOPROLOL SUCCINATE ER 100 MG TABLET,EXTENDED RELEASE 24 HR
100.0000 mg | ORAL_TABLET | Freq: Every day | ORAL | 4 refills | Status: DC
Start: 2022-04-17 — End: 2022-12-15

## 2022-04-17 NOTE — Telephone Encounter (Signed)
Called pt daughter, [name], and advised her provider sent in med to pharmacy.    Comments added by Abagail Kitchens on 04/17/22 at 12:20.

## 2022-04-17 NOTE — Telephone Encounter (Signed)
Pt daughter, [name], called in stating pt needed med called in to [facility]. [name] stated she tried to refill from MyChart, but it would not let her. [name] stated that [facility] pharmacy stated she needed a new script due to there were no refills left. [name] asked for a call back once med has been called in to the pharmacy at [phone].    Comments added by Abagail Kitchens on 04/17/22 at 09:54.

## 2022-04-26 ENCOUNTER — Other Ambulatory Visit (INDEPENDENT_AMBULATORY_CARE_PROVIDER_SITE_OTHER): Payer: Self-pay | Admitting: Internal Medicine

## 2022-05-01 ENCOUNTER — Emergency Department
Admission: EM | Admit: 2022-05-01 | Discharge: 2022-05-01 | Disposition: A | Discharge status: Home / Self Care | Payer: Medicare Other | Attending: Nurse Practitioner | Admitting: Nurse Practitioner

## 2022-05-01 ENCOUNTER — Other Ambulatory Visit (INDEPENDENT_AMBULATORY_CARE_PROVIDER_SITE_OTHER): Payer: Self-pay | Admitting: Internal Medicine

## 2022-05-01 ENCOUNTER — Encounter (HOSPITAL_COMMUNITY): Payer: Self-pay

## 2022-05-01 ENCOUNTER — Other Ambulatory Visit: Payer: Self-pay

## 2022-05-01 DIAGNOSIS — Z1612 Extended spectrum beta lactamase (ESBL) resistance: Secondary | ICD-10-CM | POA: Insufficient documentation

## 2022-05-01 DIAGNOSIS — B9620 Unspecified Escherichia coli [E. coli] as the cause of diseases classified elsewhere: Secondary | ICD-10-CM | POA: Insufficient documentation

## 2022-05-01 DIAGNOSIS — B9629 Other Escherichia coli [E. coli] as the cause of diseases classified elsewhere: Secondary | ICD-10-CM

## 2022-05-01 DIAGNOSIS — I440 Atrioventricular block, first degree: Secondary | ICD-10-CM | POA: Insufficient documentation

## 2022-05-01 DIAGNOSIS — R9431 Abnormal electrocardiogram [ECG] [EKG]: Secondary | ICD-10-CM | POA: Insufficient documentation

## 2022-05-01 DIAGNOSIS — Z20822 Contact with and (suspected) exposure to covid-19: Secondary | ICD-10-CM | POA: Insufficient documentation

## 2022-05-01 DIAGNOSIS — N390 Urinary tract infection, site not specified: Secondary | ICD-10-CM | POA: Insufficient documentation

## 2022-05-01 DIAGNOSIS — I10 Essential (primary) hypertension: Secondary | ICD-10-CM | POA: Insufficient documentation

## 2022-05-01 LAB — COVID-19 ~~LOC~~ MOLECULAR LAB TESTING
INFLUENZA VIRUS TYPE A: NOT DETECTED
INFLUENZA VIRUS TYPE B: NOT DETECTED
RESPIRATORY SYNCTIAL VIRUS (RSV): NOT DETECTED
SARS-CoV-2: NOT DETECTED

## 2022-05-01 LAB — BASIC METABOLIC PANEL
ANION GAP: 10 mmol/L (ref 4–13)
BUN/CREA RATIO: 26 — ABNORMAL HIGH (ref 6–22)
BUN: 37 mg/dL — ABNORMAL HIGH (ref 8–25)
CALCIUM: 9.7 mg/dL (ref 8.6–10.3)
CHLORIDE: 105 mmol/L (ref 96–111)
CO2 TOTAL: 26 mmol/L (ref 23–31)
CREATININE: 1.4 mg/dL — ABNORMAL HIGH (ref 0.60–1.05)
ESTIMATED GFR: 37 mL/min/BSA — ABNORMAL LOW (ref >=60)
GLUCOSE: 189 mg/dL — ABNORMAL HIGH (ref 65–125)
POTASSIUM: 4.1 mmol/L (ref 3.5–5.1)
SODIUM: 141 mmol/L (ref 136–145)

## 2022-05-01 LAB — CBC WITH DIFF
BASOPHIL #: <0.1 10*3/uL (ref <=0.20)
BASOPHIL %: 1 %
EOSINOPHIL #: 0.18 10*3/uL (ref <=0.50)
EOSINOPHIL %: 2 %
HCT: 38 % (ref 34.8–46.0)
HGB: 12.3 g/dL (ref 11.5–16.0)
IMMATURE GRANULOCYTE #: <0.1 10*3/uL (ref <0.10)
IMMATURE GRANULOCYTE %: 0 % (ref 0–1)
LYMPHOCYTE #: 2.11 10*3/uL (ref 1.00–4.80)
LYMPHOCYTE %: 26 %
MCH: 32 pg (ref 26.0–32.0)
MCHC: 32.4 g/dL (ref 31.0–35.5)
MCV: 99 fL (ref 78.0–100.0)
MONOCYTE #: 0.92 10*3/uL (ref 0.20–1.10)
MONOCYTE %: 11 %
MPV: 10.3 fL (ref 8.7–12.5)
NEUTROPHIL #: 5 10*3/uL (ref 1.50–7.70)
NEUTROPHIL %: 60 %
PLATELETS: 331 10*3/uL (ref 150–400)
RBC: 3.84 10*6/uL — ABNORMAL LOW (ref 3.85–5.22)
RDW-CV: 14.1 % (ref 11.5–15.5)
WBC: 8.3 10*3/uL (ref 3.7–11.0)

## 2022-05-01 LAB — URINALYSIS, MICROSCOPIC

## 2022-05-01 LAB — URINALYSIS, MACROSCOPIC
BILIRUBIN: NEGATIVE mg/dL
GLUCOSE: NEGATIVE mg/dL
KETONES: NEGATIVE mg/dL
NITRITE: POSITIVE — AB
PH: 7 (ref 4.6–8.0)
PROTEIN: 100 mg/dL — AB
SPECIFIC GRAVITY: 1.025 (ref 1.005–1.030)
UROBILINOGEN: 0.2 mg/dL (ref 0.2–1.0)

## 2022-05-01 LAB — THYROID STIMULATING HORMONE (SENSITIVE TSH): TSH: 0.919 u[IU]/mL (ref 0.350–4.940)

## 2022-05-01 LAB — CREATINE KINASE (CK), TOTAL, SERUM: CREATINE KINASE: 52 U/L (ref 25–190)

## 2022-05-01 LAB — PT/INR
INR: 0.99 — ABNORMAL LOW (ref 2.00–3.00)
PROTHROMBIN TIME: 10 seconds (ref 9.3–11.4)

## 2022-05-01 LAB — TROPONIN-I: TROPONIN-I HS: 6.6 ng/L (ref <=14.0)

## 2022-05-01 LAB — HEPATIC FUNCTION PANEL
ALBUMIN: 3.4 g/dL (ref 3.4–4.8)
ALKALINE PHOSPHATASE: 129 U/L (ref 55–145)
ALT (SGPT): 7 U/L — ABNORMAL LOW (ref 8–22)
AST (SGOT): 16 U/L (ref 8–45)
BILIRUBIN DIRECT: 0.2 mg/dL (ref 0.1–0.4)
BILIRUBIN TOTAL: 0.4 mg/dL (ref 0.3–1.3)
PROTEIN TOTAL: 6.7 g/dL (ref 6.0–8.0)

## 2022-05-01 LAB — MAGNESIUM: MAGNESIUM: 2.3 mg/dL (ref 1.8–2.6)

## 2022-05-01 LAB — GRAY TOP TUBE

## 2022-05-01 LAB — B-TYPE NATRIURETIC PEPTIDE: BNP: 153 pg/mL — ABNORMAL HIGH (ref <=99)

## 2022-05-01 LAB — PTT (PARTIAL THROMBOPLASTIN TIME): APTT: 22.2 seconds — ABNORMAL LOW (ref 25.0–36.0)

## 2022-05-01 MED ORDER — CIPROFLOXACIN 500 MG TABLET
500.0000 mg | ORAL_TABLET | ORAL | Status: AC
Start: 2022-05-01 — End: 2022-05-01
  Administered 2022-05-01: 500 mg via ORAL
  Filled 2022-05-01: qty 1

## 2022-05-01 MED ORDER — AMLODIPINE 10 MG TABLET
10.0000 mg | ORAL_TABLET | Freq: Every day | ORAL | 4 refills | Status: DC
Start: 2022-05-01 — End: 2022-06-15

## 2022-05-01 MED ORDER — AMLODIPINE 10 MG TABLET
10.0000 mg | ORAL_TABLET | ORAL | Status: AC
Start: 2022-05-01 — End: 2022-05-01
  Administered 2022-05-01: 10 mg via ORAL
  Filled 2022-05-01: qty 1

## 2022-05-01 MED ORDER — HYDRALAZINE 20 MG/ML INJECTION SOLUTION
10.0000 mg | INTRAMUSCULAR | Status: AC
Start: 2022-05-01 — End: 2022-05-01
  Administered 2022-05-01: 10 mg via INTRAVENOUS
  Filled 2022-05-01: qty 1

## 2022-05-01 MED ORDER — AMLODIPINE 5 MG TABLET
5.0000 mg | ORAL_TABLET | ORAL | Status: AC
Start: 2022-05-01 — End: 2022-05-01
  Administered 2022-05-01: 5 mg via ORAL
  Filled 2022-05-01: qty 1

## 2022-05-01 MED ORDER — CIPROFLOXACIN 500 MG TABLET
500.0000 mg | ORAL_TABLET | Freq: Two times a day (BID) | ORAL | 0 refills | Status: AC
Start: 2022-05-01 — End: 2022-05-08

## 2022-05-01 NOTE — Telephone Encounter (Signed)
Theresa Greene's daughter called and said that [place] are in [place] at [place] to have [place] esophagus stretched, but because of her BP they are deferring to medical doctor. Her BP readings 215/111 218/102 198/110 247/118 this reading was manual 220/112. She took her BP medications that she is prescribed at 7 am and they readings were taken between 12:00 and 12:30. Her daughter said she can do a virtual visit on her phone. I told her I would message Dr. Earnest Bailey and call her back. The surgery has been deferred. Comments added by Ludwig Lean on 05/01/22 at 13:31.

## 2022-05-01 NOTE — Discharge Instructions (Addendum)
Check your blood pressure and keep a log; bring it to the appt with Dr Earnest Bailey on Friday; you do have an active urinary tract infection, take the Cipro as prescribed; drink lots of water.

## 2022-05-01 NOTE — ED Nurses Note (Signed)
Discharge instructions given, patient and daughter verbalized understanding, discharged in stable condition, all questions answered.

## 2022-05-01 NOTE — ED Provider Notes (Signed)
Department of Emergency Medicine  [facility]  05/01/2022      Patient is a 87 y.o. female presenting to the ED with chief complaint of Hypertension     Triage Note  09/19 1601   Did not sleep well last night.  Was scheduled for an esophageal dilation today.  They would not do the surgery due to blood pressure.       HPI    Patient presents today with daughter for concerns related to her blood pressure. She went to have her esophageal dilatation today by Dr Jolayne Panther, and they declined to do the procedure because of there elevated BP's. Daughter reports to the nurse she feels like her mother doesn't always takes her medications like she should due to some mild forgetfulness. I spoke with her PCP and she feels the same; the patient lives at home with her husband. Patient refuses to move near her children and refuses to have others come in her home to help check on medications. Patient stated she took her BP last night and this am as she was supposed to. She denies change/loss of vision, and she denies HA, chills, fever, NVD, dizziness, numbness, tingling.      Review of Symptoms: All systems have been reviewed and found to be negative except for as stated above in the history of present illness.    History    Past Medical History:   Diagnosis Date    Arthritis     Asthma     Back problem     CAD S/P percutaneous coronary angioplasty 2010    Pt reports 5 years ago    Charcot's joint of foot, left 08/06/2014    Colon polyp     CVA (cerebrovascular accident) (CMS [place]) 1995    no residual deficits    Deaf     Diverticulitis of colon     Gastroesophageal reflux disease 08/06/2014    GI bleed     H/O hearing loss     History of cardiac pacemaker 08/06/2014    HTN (hypertension)     Hyperlipidemia 12/25/2019    Muscle weakness     Neuropathy (CMS HCC)     Pacemaker     Paroxysmal atrial fibrillation (CMS HCC) 08/06/2014    PUD (peptic ulcer disease) 08/06/2014    C/b GIB Spring 2015    Rheumatoid arthritis  (CMS [place]) 08/06/2014    Stage 3 chronic kidney disease (CMS [place]) 12/25/2019    Last Assessment & Plan:  Renal function stable, at baseline Stable electrolytes; restart sodium bicarb 650 mg BID  Continue renal protective ARB Discussed CKD risk factor modifications, including tight BP/glucose control, low protein diet, and avoidance of NSAIDs    Stented coronary artery     Vitamin D deficiency 12/25/2019    Wears glasses      Past Surgical History:   Procedure Laterality Date    Ankle surgery Left     Cataract extraction      Coronary artery angioplasty  2011    Esophagogastroduodenoscopy      Hip surgery Right     Hx back surgery      Hx colonoscopy      Hx hip replacement Left     Hx nissen fundoplication  [id number]    Hx open cholecystectomy  1962    Hx pacemaker insertion  2019    Hx shoulder surgery Bilateral     Knee arthroplasty Bilateral     Orif distal femur  fracture Right     Other surgical history Right     Pancreatic pseudocyst drainage  1971    Total abdominal hysterectomy       Allergies   Allergen Reactions    Iodinated Contrast Media Anaphylaxis    Iv Contrast Anaphylaxis     makese her serverly sick    Etodolac NO Steroids unless approved by Attending Physician    Symbicort [Budesonide-Formoterol]     Codeine  Other Adverse Reaction (Add comment) and Nausea/ Vomiting     Stomach pain  Stomach pain        Current Outpatient Medications   Medication Sig    acetaminophen (TYLENOL ARTHRITIS PAIN) 650 mg Oral Tablet Sustained Release Take 2 Tablets (1,300 mg total) by mouth Twice per day as needed for Pain    amLODIPine (NORVASC) 10 mg Oral Tablet Take 1 Tablet (10 mg total) by mouth Once a day    atorvastatin (LIPITOR) 20 mg Oral Tablet Take 1 Tablet (20 mg total) by mouth Every evening    calcitrioL (ROCALTROL) 0.5 mcg Oral Capsule Take 1 Capsule (0.5 mcg total) by mouth Once a day for 90 days    celecoxib (CELEBREX) 200 mg Oral Capsule Take 1 Capsule (200 mg total) by mouth Once per day as needed for Pain     ciprofloxacin HCl (CIPRO) 500 mg Oral Tablet Take 1 Tablet (500 mg total) by mouth Twice daily for 7 days    clopidogreL (PLAVIX) 75 mg Oral Tablet TAKE ONE TABLET BY MOUTH DAILY    CREON 36,000-114,000- 180,000 unit Oral Capsule, Delayed Release(E.C.) 1 Capsule Twice daily    ergocalciferol, vitamin D2, (DRISDOL) 1,250 mcg (50,000 unit) Oral Capsule Take 1 Capsule (50,000 Units total) by mouth Every 7 days for 90 days Takes on Fridays    ezetimibe (ZETIA) 10 mg Oral Tablet TAKE ONE TABLET BY MOUTH DAILY    folic acid (FOLVITE) 1 mg Oral Tablet Take 1 Tablet (1 mg total) by mouth Once a day    gabapentin (NEURONTIN) 100 mg Oral Capsule Take 1 Capsule (100 mg total) by mouth Twice daily    gemfibroziL (LOPID) 600 mg Oral Tablet Take 1 Tablet (600 mg total) by mouth Once a day for 360 days    losartan (COZAAR) 50 mg Oral Tablet Take 1 Tablet (50 mg total) by mouth Twice daily for 90 days    methotrexate 2.5 mg Oral tablet Take 1 Tablet (2.5 mg total) by mouth Every 7 days [place] on Thursdays    metoprolol succinate (TOPROL-XL) 100 mg Oral Tablet Sustained Release 24 hr Take 1 Tablet (100 mg total) by mouth Once a day    nitroGLYCERIN (NITROSTAT) 0.4 mg Sublingual Tablet, Sublingual Place 1 Tablet (0.4 mg total) under the tongue Every 5 minutes as needed for Chest pain (after three doses, call ER if pain still persists) for 3 doses over 15 minutes    oxyBUTYnin chloride (DITROPAN XL) 10 mg Oral Tablet Extended Rel 24 hr Take 1 Tablet (10 mg total) by mouth Once a day for 90 days (Patient taking differently: Take 5 mg by mouth Once a day)    sodium bicarbonate 650 mg Oral Tablet Take 1 Tablet (650 mg total) by mouth Twice daily        Above history reviewed with patient.  Previous records also reviewed.     Physical Exam:     Filed Vitals:    05/01/22 1900 05/01/22 1930 05/01/22 1945 05/01/22 2000   BP: Marland Kitchen)  191/92 (!) 182/98 (!) 190/79 (!) 154/70   Pulse: 70 70     Resp: 18 18     Temp:       SpO2: 97% 96% 97% 96%        Physical Exam  Vitals and nursing note reviewed.   Constitutional:       General: She is not in acute distress.     Appearance: Normal appearance. She is not ill-appearing, toxic-appearing or diaphoretic.   HENT:      Head: Normocephalic and atraumatic.      Nose: Nose normal.      Mouth/Throat:      Mouth: Mucous membranes are moist.   Eyes:      Pupils: Pupils are equal, round, and reactive to light.   Neck:      Vascular: No carotid bruit.   Cardiovascular:      Rate and Rhythm: Normal rate and regular rhythm.      Heart sounds: Normal heart sounds.   Pulmonary:      Effort: Pulmonary effort is normal. No respiratory distress.      Breath sounds: Normal breath sounds. No stridor. No wheezing, rhonchi or rales.   Chest:      Chest wall: No tenderness.   Musculoskeletal:         General: No swelling or signs of injury. Normal range of motion.      Cervical back: Normal range of motion and neck supple.   Skin:     General: Skin is warm and dry.      Capillary Refill: Capillary refill takes less than 2 seconds.      Coloration: Skin is not jaundiced.      Findings: No erythema.   Neurological:      Mental Status: She is alert and oriented to person, place, and time.      Motor: No weakness.      Gait: Gait normal.       Procedures     Orders:  Orders Placed This Encounter    CANCELED: URINE CULTURE,ROUTINE    COVID - 19 SCREENING - Symptomatic - PUI    TROPONIN-I    THYROID STIMULATING HORMONE (SENSITIVE TSH)    PT/INR    PTT (PARTIAL THROMBOPLASTIN TIME)    MAGNESIUM    HEPATIC FUNCTION PANEL    CREATINE KINASE (CK), TOTAL, SERUM    CBC/DIFF    BASIC METABOLIC PANEL    B-TYPE NATRIURETIC PEPTIDE    URINALYSIS, MACROSCOPIC AND MICROSCOPIC W/CULTURE REFLEX    CBC WITH DIFF    URINALYSIS, MACROSCOPIC    URINALYSIS, MICROSCOPIC    EXTRA TUBES    GRAY TOP TUBE    ECG 12 LEAD    amLODIPine (NORVASC) tablet    hydrALAZINE (APRESOLINE) injection 10 mg    ciprofloxacin (CIPRO) tablet    hydrALAZINE (APRESOLINE)  injection 10 mg    amLODIPine (NORVASC) tablet    ciprofloxacin HCl (CIPRO) 500 mg Oral Tablet    amLODIPine (NORVASC) 10 mg Oral Tablet       Abnormal Lab results:  Labs Ordered/Reviewed   PT/INR - Abnormal; Notable for the following components:       Result Value    INR 0.99 (*)     All other components within normal limits   PTT (PARTIAL THROMBOPLASTIN TIME) - Abnormal; Notable for the following components:    APTT 22.2 (*)     All other components within normal limits   HEPATIC FUNCTION PANEL -  Abnormal; Notable for the following components:    ALT (SGPT) 7 (*)     All other components within normal limits   BASIC METABOLIC PANEL - Abnormal; Notable for the following components:    GLUCOSE 189 (*)     BUN 37 (*)     CREATININE 1.40 (*)     BUN/CREA RATIO 26 (*)     ESTIMATED GFR 37 (*)     All other components within normal limits   B-TYPE NATRIURETIC PEPTIDE - Abnormal; Notable for the following components:    BNP 153 (*)     All other components within normal limits   CBC WITH DIFF - Abnormal; Notable for the following components:    RBC 3.84 (*)     All other components within normal limits   URINALYSIS, MACROSCOPIC - Abnormal; Notable for the following components:    PROTEIN 100 (*)     BLOOD Trace (*)     NITRITE Positive (*)     LEUKOCYTES Moderate (*)     APPEARANCE Cloudy (*)     COLOR Light Yellow (*)     All other components within normal limits   URINALYSIS, MICROSCOPIC - Abnormal; Notable for the following components:    WBCS TNTC (*)     BACTERIA 3+ (*)     All other components within normal limits   COVID-19 [place] MOLECULAR LAB TESTING - Normal    Narrative:     Results are for the simultaneous qualitative identification of SARS-CoV-2 (formerly 2019-nCoV), Influenza A, Influenza B, and RSV RNA. These etiologic agents are generally detectable in nasopharyngeal and nasal swabs during the ACUTE PHASE of infection. Hence, this test is intended to be performed on respiratory specimens collected from  individuals with signs and symptoms of upper respiratory tract infection who meet Centers for Disease Control and Prevention (CDC) clinical and/or epidemiological criteria for Coronavirus Disease 2019 (COVID-19) testing. CDC COVID-19 criteria for testing on human specimens is available at [facility] webpage information for Healthcare Professionals: Coronavirus Disease 2019 (COVID-19) ([email or web address]).                                     False-negative results may occur if the virus has genomic mutations, insertions, deletions, or rearrangements or if performed very early in the course of illness. Otherwise, negative results indicate virus specific RNA targets are not detected, however negative results do not preclude SARS-CoV-2 infection/COVID-19, Influenza, or Respiratory syncytial virus infection. Results should not be used as the sole basis for patient management decisions. Negative results must be combined with clinical observations, patient history, and epidemiological information. If upper respiratory tract infection is still suspected based on exposure history together with other clinical findings, re-testing should be considered.                                    Disclaimer:                   This assay has been authorized by FDA under an Emergency Use Authorization for use in laboratories certified under the Clinical Laboratory Improvement Amendments of 1988 (CLIA), 42 U.S.C. [phone], to perform high complexity tests. The impacts of vaccines, antiviral therapeutics, antibiotics, chemotherapeutic or immunosuppressant drugs have not been evaluated.  Test methodology:                   Cepheid Xpert Xpress SARS-CoV-2/Flu/RSV Assay real-time polymerase chain reaction (RT-PCR) test on the GeneXpert Dx and Xpert Xpress systems.   TROPONIN-I - Normal   THYROID STIMULATING HORMONE (SENSITIVE TSH) - Normal   MAGNESIUM - Normal   CREATINE KINASE  (CK), TOTAL, SERUM - Normal   CBC/DIFF    Narrative:     The following orders were created for panel order CBC/DIFF.                  Procedure                               Abnormality         Status                                     ---------                               -----------         ------                                     CBC WITH [id number]                Abnormal            Final result                                                 Please view results for these tests on the individual orders.   URINALYSIS, MACROSCOPIC AND MICROSCOPIC W/CULTURE REFLEX    Narrative:     The following orders were created for panel order URINALYSIS, MACROSCOPIC AND MICROSCOPIC W/CULTURE REFLEX.                  Procedure                               Abnormality         Status                                     ---------                               -----------         ------                                     URINALYSIS, MACROSCOPIC[[id number]]      Abnormal            Final result  URINALYSIS, MICROSCOPIC[[id number]]      Abnormal            Final result                                                 Please view results for these tests on the individual orders.   EXTRA TUBES    Narrative:     The following orders were created for panel order EXTRA TUBES.                  Procedure                               Abnormality         Status                                     ---------                               -----------         ------                                     Feliberto Gottron [id number]                                    Final result                                                 Please view results for these tests on the individual orders.   GRAY TOP TUBE       Imaging:         Therapy/Procedures/Course/MDM:   Appropriate labs and imaging ordered.  Medical records reviewed.  During the patient's stay in the emergency department, the above-listed imaging and, or labs  are performed to assist with medical decision making and reviewed by myself and attending when available for review.  ED Course as of 05/02/22 1206   Tue May 01, 2022   1846 BP (Non-Invasive)(!): 175/84     Medical Decision Making  Problems Addressed:  Hypertension, unspecified type: acute illness or injury     Details: gave two doses of hydralazine, and extra dose of norvasc; discussed POC with PCP; patient has a f/u on Friday with her and patient will keep her appt as scheduled.  UTI due to extended-spectrum beta lactamase (ESBL) producing Escherichia coli: chronic illness or injury with exacerbation, progression, or side effects of treatment     Details: cipro    Amount and/or Complexity of Data Reviewed  Labs: ordered.  ECG/medicine tests: ordered.    Risk  Prescription drug management.        Patient vitally stable throughout stay in the ED.        Impression:    Clinical Impression   UTI due to extended-spectrum beta lactamase (ESBL) producing Escherichia  coli (Primary)   Hypertension, unspecified type       Disposition:    Discharged    Following the above history, physical exam, and studies, the patient was deemed stable and suitable for discharge.   It was advised that the patient return to the ED with any new, concerning or worsening symptoms and follow up as directed.   Return precautions reviewed.   The patient verbalized understanding of all instructions, had no further questions or concerns, and agreed to plan.      Follow Up:    Bosie Clos, DO  [address]  [place] [id number]  [phone]    On 05/04/2022      Future Appointments   Date Time Provider [place]   05/04/2022 11:40 AM Hill-Reinert, [facility]   05/14/2022 11:00 AM Gerrie Nordmann, MD [facility]   07/03/2022  2:30 PM Einar Gip, MD [facility] [place]        Prescriptions:      Current Discharge Medication List        START taking these medications.        Details   ciprofloxacin HCl 500 mg  Tablet  Commonly known as: CIPRO   500 mg, Oral, 2 TIMES DAILY  Qty: 14 Tablet  Refills: 0            CONTINUE these medications which have CHANGED during your visit.        Details   amLODIPine 10 mg Tablet  Commonly known as: NORVASC  What changed:   medication strength  how much to take   10 mg, Oral, DAILY  Qty: 90 Tablet  Refills: 4            CONTINUE these medications - NO CHANGES were made during your visit.        Details   acetaminophen 650 mg Tablet Sustained Release  Commonly known as: TYLENOL ARTHRITIS PAIN   1,300 mg, Oral, 2 TIMES DAILY PRN  Refills: 0     atorvastatin 20 mg Tablet  Commonly known as: LIPITOR   20 mg, Oral, EVERY EVENING  Refills: 0     calcitrioL 0.5 mcg Capsule  Commonly known as: ROCALTROL   0.5 mcg, Oral, DAILY  Qty: 90 Capsule  Refills: 3     celecoxib 200 mg Capsule  Commonly known as: CeleBREX   200 mg, Oral, DAILY PRN  Refills: 0     clopidogreL 75 mg Tablet  Commonly known as: PLAVIX   TAKE ONE TABLET BY MOUTH DAILY  Qty: 90 Tablet  Refills: 3     Creon 36,000-114,000- 180,000 unit Capsule, Delayed Release(E.C.)  Generic drug: lipase-protease-amylase   1 Capsule, 2 TIMES DAILY  Refills: 0     ergocalciferol (vitamin D2) 1,250 mcg (50,000 unit) Capsule  Commonly known as: DRISDOL   50,000 Units, Oral, EVERY 7 DAYS, Takes on Fridays  Qty: 12 Capsule  Refills: 3     ezetimibe 10 mg Tablet  Commonly known as: ZETIA   TAKE ONE TABLET BY MOUTH DAILY  Qty: 90 Tablet  Refills: 3     folic acid 1 mg Tablet  Commonly known as: FOLVITE   1 mg, Oral, DAILY  Refills: 0     gabapentin 100 mg Capsule  Commonly known as: NEURONTIN   100 mg, Oral, 2 TIMES DAILY  Qty: 180 Capsule  Refills: 1     losartan 50 mg Tablet  Commonly known as: COZAAR  50 mg, Oral, 2 TIMES DAILY  Qty: 180 Tablet  Refills: 1     methotrexate 2.5 mg tablet   2.5 mg, Oral, EVERY 7 DAYS, [place] on Thursdays  Refills: 0     metoprolol succinate 100 mg Tablet Sustained Release 24 hr  Commonly known as: TOPROL-XL   100 mg,  Oral, DAILY  Qty: 90 Tablet  Refills: 4     nitroGLYCERIN 0.4 mg Tablet, Sublingual  Commonly known as: NITROSTAT   0.4 mg, Sublingual, EVERY 5 MIN PRN, for 3 doses over 15 minutes  Qty: 25 Tablet  Refills: 5     oxyBUTYnin chloride 10 mg Tablet Extended Rel 24 hr  Commonly known as: DITROPAN XL   10 mg, Oral, DAILY  Qty: 90 Tablet  Refills: 0     sodium bicarbonate 650 mg Tablet   650 mg, Oral, 2 TIMES DAILY  Qty: 180 Tablet  Refills: 3              Attestation:     The co-signing Dr was present in the ED and available for consultation and did participate in the care of the patient.     The patient was given the opportunity to ask questions and those questions were answered to the patient's satisfaction. The patient was encouraged to call with any additional questions or concerns. Patient was provided work/school excuse as needed. Discussed with patient effects and side effects of medications. Medication safety was discussed. The patient was informed to contact the clinic/hospital in 7 business days if a message/lab results/referral/imaging results have not been conveyed to the patient. The patient verbalized understanding of treatment plan.    This note was partly generated using a voice recognition software; please excuse any grammatical errors.       Nathanial Millman, APRN

## 2022-05-01 NOTE — ED Triage Notes (Signed)
Did not sleep well last night.  Was scheduled for an esophageal dilation today.  They would not do the surgery due to blood pressure.

## 2022-05-01 NOTE — Telephone Encounter (Signed)
Called them back and let them know and they decided to wait until the appointment on Friday. Comments added by Ludwig Lean on 05/01/22 at 13:52.

## 2022-05-01 NOTE — ED Nurses Note (Signed)
Manual BP obtained at this time. Right 224/118. Left arm 240/108. [place] notified.

## 2022-05-01 NOTE — Telephone Encounter (Signed)
She has an appointment on Friday unless they just want to do a virtual visit tomorrow. Either is fine.

## 2022-05-01 NOTE — Telephone Encounter (Signed)
Pt daughter called and stated pt was in ED at the moment. Pt daughter stated pt was out of Lopid medication at home and asked if it can be refilled. Pt  daughter asked for a call back.    Comments added by Debarah Crape on 05/01/22 at 16:49.

## 2022-05-02 ENCOUNTER — Telehealth (INDEPENDENT_AMBULATORY_CARE_PROVIDER_SITE_OTHER): Payer: Self-pay | Admitting: Internal Medicine

## 2022-05-02 ENCOUNTER — Telehealth (HOSPITAL_COMMUNITY): Payer: Self-pay

## 2022-05-02 MED ORDER — GEMFIBROZIL 600 MG TABLET
600.0000 mg | ORAL_TABLET | Freq: Every day | ORAL | 3 refills | Status: DC
Start: 2022-05-02 — End: 2023-01-24

## 2022-05-02 NOTE — Telephone Encounter (Signed)
Let them know that because her weight has dropped, this has to be switching to daily dosing instead of twice daily.

## 2022-05-02 NOTE — Progress Notes (Signed)
[place] ED Follow-Up:   Document completed and/or attempted interactive contact(s) after transition to home after emergency department stay.:   Transition Facility and relevant Date:   Discharge Date: 05/01/22  Discharge from [facility] Emergency Department?: Yes  Discharge Facility: [facility]  Contacted by: Cipriano Mile RN  Contact method: Patient/Caregiver Telephone  Contact completed: 05/02/2022  9:42 AM       Cipriano Mile, RN

## 2022-05-02 NOTE — Telephone Encounter (Addendum)
Post Ed Follow-Up    Post ED Follow-Up:   Document completed and/or attempted interactive contact(s) after transition to home after emergency department stay.:   Transition Facility and relevant Date:   Discharge Date: 05/01/22  Discharge from [facility] Emergency Department?: Yes  Discharge Facility: [facility]  Contacted by: Aloha Gell  Contact method: Patient/Caregiver Telephone  Contact first attempt: 05/02/2022  9:56 AM  Left message with husband for patient to return call.       Post Ed Follow-Up    Post ED Follow-Up:   Document completed and/or attempted interactive contact(s) after transition to home after emergency department stay.:   Transition Facility and relevant Date:   Discharge Date: 05/01/22  Discharge from [facility] Emergency Department?: Yes  Discharge Facility: [facility]  Contacted by: Aloha Gell  Contact method: Patient/Caregiver Telephone  Contact completed: 05/02/2022  2:30 PM  Contact first attempt: 05/02/2022  9:56 AM  MyChart message sent?: No  Was the AVS reviewed with patient?: No  How is the patient recovering?: Improving  Medications prescribed: Yes  Were they obtained?: Yes  Interventions: Provided patient with clinic contact  Patient stated she is doing better and is keeping a log of BP and will bring it into appointment.         Aloha Gell, MA

## 2022-05-03 LAB — URINE CULTURE,ROUTINE: URINE CULTURE: >100000 — AB

## 2022-05-04 ENCOUNTER — Other Ambulatory Visit: Payer: Self-pay

## 2022-05-04 ENCOUNTER — Encounter (INDEPENDENT_AMBULATORY_CARE_PROVIDER_SITE_OTHER): Payer: Self-pay | Admitting: Internal Medicine

## 2022-05-04 ENCOUNTER — Ambulatory Visit: Payer: Medicare Other | Attending: Internal Medicine | Admitting: Internal Medicine

## 2022-05-04 ENCOUNTER — Other Ambulatory Visit: Payer: Medicare Other | Attending: Internal Medicine

## 2022-05-04 VITALS — BP 140/74 | HR 70 | Temp 97.6°F | Wt 110.0 lb

## 2022-05-04 DIAGNOSIS — F419 Anxiety disorder, unspecified: Secondary | ICD-10-CM | POA: Insufficient documentation

## 2022-05-04 DIAGNOSIS — N390 Urinary tract infection, site not specified: Secondary | ICD-10-CM

## 2022-05-04 DIAGNOSIS — N183 Chronic kidney disease, stage 3 unspecified: Secondary | ICD-10-CM | POA: Insufficient documentation

## 2022-05-04 DIAGNOSIS — Z87440 Personal history of urinary (tract) infections: Secondary | ICD-10-CM | POA: Insufficient documentation

## 2022-05-04 DIAGNOSIS — I480 Paroxysmal atrial fibrillation: Secondary | ICD-10-CM | POA: Insufficient documentation

## 2022-05-04 DIAGNOSIS — E559 Vitamin D deficiency, unspecified: Secondary | ICD-10-CM | POA: Insufficient documentation

## 2022-05-04 DIAGNOSIS — Z79899 Other long term (current) drug therapy: Secondary | ICD-10-CM | POA: Insufficient documentation

## 2022-05-04 DIAGNOSIS — I10 Essential (primary) hypertension: Secondary | ICD-10-CM | POA: Insufficient documentation

## 2022-05-04 LAB — COMPREHENSIVE METABOLIC PANEL, NON-FASTING
ALBUMIN: 3.4 g/dL (ref 3.4–4.8)
ALKALINE PHOSPHATASE: 104 U/L (ref 55–145)
ALT (SGPT): 7 U/L — ABNORMAL LOW (ref 8–22)
ANION GAP: 10 mmol/L (ref 4–13)
AST (SGOT): 20 U/L (ref 8–45)
BILIRUBIN TOTAL: 0.5 mg/dL (ref 0.3–1.3)
BUN/CREA RATIO: 28 — ABNORMAL HIGH (ref 6–22)
BUN: 43 mg/dL — ABNORMAL HIGH (ref 8–25)
CALCIUM: 9.1 mg/dL (ref 8.6–10.3)
CHLORIDE: 108 mmol/L (ref 96–111)
CO2 TOTAL: 25 mmol/L (ref 23–31)
CREATININE: 1.52 mg/dL — ABNORMAL HIGH (ref 0.60–1.05)
ESTIMATED GFR: 34 mL/min/BSA — ABNORMAL LOW (ref >=60)
GLUCOSE: 105 mg/dL (ref 65–125)
POTASSIUM: 3.9 mmol/L (ref 3.5–5.1)
PROTEIN TOTAL: 6.7 g/dL (ref 6.0–8.0)
SODIUM: 143 mmol/L (ref 136–145)

## 2022-05-04 LAB — CBC
HCT: 39.1 % (ref 34.8–46.0)
HGB: 12.5 g/dL (ref 11.5–16.0)
MCH: 31.8 pg (ref 26.0–32.0)
MCHC: 32 g/dL (ref 31.0–35.5)
MCV: 99.5 fL (ref 78.0–100.0)
MPV: 9.9 fL (ref 8.7–12.5)
PLATELETS: 320 10*3/uL (ref 150–400)
RBC: 3.93 10*6/uL (ref 3.85–5.22)
RDW-CV: 13.9 % (ref 11.5–15.5)
WBC: 8.1 10*3/uL (ref 3.7–11.0)

## 2022-05-04 LAB — PHOSPHORUS: PHOSPHORUS: 4.3 mg/dL — ABNORMAL HIGH (ref 2.3–4.0)

## 2022-05-04 LAB — THYROID STIMULATING HORMONE WITH FREE T4 REFLEX: TSH: 1.703 u[IU]/mL (ref 0.350–4.940)

## 2022-05-04 LAB — HGA1C (HEMOGLOBIN A1C WITH EST AVG GLUCOSE)
ESTIMATED AVERAGE GLUCOSE: 105 mg/dL
HEMOGLOBIN A1C: 5.3 % (ref <=5.7)

## 2022-05-04 LAB — LIPID PANEL
CHOL/HDL RATIO: 2.4
CHOLESTEROL: 147 mg/dL (ref 100–200)
HDL CHOL: 61 mg/dL (ref >=50)
LDL CALC: 70 mg/dL (ref <100)
NON-HDL: 86 mg/dL (ref <=190)
TRIGLYCERIDES: 81 mg/dL (ref <150)
VLDL CALC: 12 mg/dL (ref <30)

## 2022-05-04 LAB — VITAMIN D 25 TOTAL: VITAMIN D 25, TOTAL: 26 ng/mL — ABNORMAL LOW (ref 30.0–100.0)

## 2022-05-04 LAB — PARATHYROID HORMONE (PTH): PTH: 49 pg/mL (ref 8.5–77.0)

## 2022-05-04 MED ORDER — ERGOCALCIFEROL (VITAMIN D2) 1,250 MCG (50,000 UNIT) CAPSULE
50000.0000 [IU] | ORAL_CAPSULE | ORAL | 0 refills | Status: DC
Start: 2022-05-04 — End: 2023-01-31

## 2022-05-04 NOTE — Telephone Encounter (Signed)
Called and advised pt of providers message. Pt stated she had an apt with provider today and she verbally stated she understood her dose was changing.    Comments added by Debarah Crape on 05/04/22 at 07:31.

## 2022-05-04 NOTE — Progress Notes (Unsigned)
[facility]  PEDIATRICS/INTERNAL MEDICINE, [facility]  [address]  [place] [address]  [phone]  05/07/2022       Name:  Theresa Greene MRN: [id number]   Date:    05/04/2022 Age:  87 y.o.       Reason for Visit:  Chief Complaint   Patient presents with    Follow Up     Had labwork done this morning, has been feeling okay     ED Follow-up     Doing okay, she says she has been checking her BP at home since er visit        Subjective:   Theresa Greene is a 87 y.o. female who comes in today for a follow up on high blood pressure. She was seen in the Emergency Department on 05/01/2022 for extremely high blood pressures. She was also scheduled for endoscopy in [place] on that day, but it was cancelled secondary to her high blood pressures. Those readings were running in the 210-220/100-110 range. In the Emergency Department, she was noted to be running in the 180-190/79-98 range. She was given two doses of hydralazine and administered an additional dose of amlodipine  oral. Her amlodipine was increased to 10 mg oral daily and she was advised to follow up with her PCP. She denies having missed any pills, but her daughter says there had been some issues with pills being mixed in other bottles. She says that she was also scared due to issues with her husband last weekend as. Her daughter says that she is watching him decline and not be as active as before. She has relied on him to help take care of her and now it is invoking a lot of anxiety for her. Her daughter says that she will not refer to this as "anxiety" though and just says that she is scared. Her blood pressure has been running in the 130/70's range at home. She was also found to have another UTI in the Emergency Department and was started on Cipro for this.      Her daughter feels that she needs a medication for anxiety. She is taking CBD gummies that her other daughter has gotten  for her. Theresa Greene says that she has found a bottle of melatonin 10 mg as well at the house, but she says she is not taking those. She does not want to do another pill for her anxiety right now. Her daughter says that she would be okay with a gummy or a liquid, but is refusing to add another pill right now.     She plans to get her COVID boaster and the 2nd shingles shot at the same time when the boaster is available. She is also due for a tetanus vaccine. She is going to see Dr. Dahlia Client with Urology at [facility] on the 31st for her recurrent UTIs. She is currently taking the Cipro without any issues. Her daughter would like an order for a repeat urinalysis when she has finished the antibiotic.     She has already completed her diabetic eye exam this year with Dr Janee Morn and it is scanned in to the chart. She is due for a urine microalbumin, which I will do at her next visit. In regards to her laboratories, they all look fairly stable with the exception of her vitamin D and phosphorus. The PTH has normalized with the increase in the calcitriol.     Past  Medical History:    Past Medical History:   Diagnosis Date    Arthritis     Asthma     Back problem     CAD S/P percutaneous coronary angioplasty 2010    Pt reports 5 years ago    Charcot's joint of foot, left 08/06/2014    Colon polyp     CVA (cerebrovascular accident) (CMS HCC) 1995    no residual deficits    Deaf     Diverticulitis of colon     Gastroesophageal reflux disease 08/06/2014    GI bleed     H/O hearing loss     History of cardiac pacemaker 08/06/2014    HTN (hypertension)     Hyperlipidemia 12/25/2019    Muscle weakness     Neuropathy (CMS HCC)     Pacemaker     Paroxysmal atrial fibrillation (CMS HCC) 08/06/2014    PUD (peptic ulcer disease) 08/06/2014    C/b GIB Spring 2015    Rheumatoid arthritis (CMS HCC) 08/06/2014    Stage 3 chronic kidney disease (CMS HCC) 12/25/2019    Last Assessment & Plan:  Renal function stable, at baseline Stable electrolytes;  restart sodium bicarb 650 mg BID  Continue renal protective ARB Discussed CKD risk factor modifications, including tight BP/glucose control, low protein diet, and avoidance of NSAIDs    Stented coronary artery     Vitamin D deficiency 12/25/2019    Wears glasses       Past Surgical History:    Past Surgical History:   Procedure Laterality Date    ANKLE SURGERY Left     repair with metal    CATARACT EXTRACTION      unsure which eye but thinks she only had one eye done    CORONARY ARTERY ANGIOPLASTY  2011    ESOPHAGOGASTRODUODENOSCOPY      HIP SURGERY Right     repair with metal    HX BACK SURGERY      lower back x 3 no metal    HX COLONOSCOPY      HX HIP REPLACEMENT Left     HX NISSEN FUNDOPLICATION  2002    then three others to repair problems    HX OPEN CHOLECYSTECTOMY  1962    with common duxt exploration    HX PACEMAKER INSERTION  2019    HX SHOULDER SURGERY Bilateral     scopes    KNEE ARTHROPLASTY Bilateral     OPEN REDUCTION INTERNAL FIXATION FRACTURE FEMUR Right 05/07/2020    Performed by Jeanell Sparrow, MD at [facility] OR 5 NORTH    ORIF DISTAL FEMUR FRACTURE Right     OTHER SURGICAL HISTORY Right     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    PANCREATIC PSEUDOCYST DRAINAGE  1971    REPAIR ECTROPION Right 09/21/2021    Performed by Pierce Crane, MD at [facility] OR MAIN    REPAIR PTOSIS Left 09/21/2021    Performed by Pierce Crane, MD at [facility] OR MAIN    TOTAL ABDOMINAL HYSTERECTOMY       Family History:    Family Medical History:       Problem Relation (Age of Onset)    Brain cancer Mother    Ovarian Cancer Mother    Pancreatic Cancer Father             Social History:   Social History     Tobacco Use  Smoking status: Never    Smokeless tobacco: Never   Substance Use Topics    Alcohol use: Never      Social History     Substance and Sexual Activity   Drug Use Never      Social History     Social History Narrative    Lives at home with husband. 2 grown daughters - an Charity fundraiser and a Mining engineer.  She did Building control surveyor for 23 years.         Allergies:    Allergies   Allergen Reactions    Iodinated Contrast Media Anaphylaxis    Iv Contrast Anaphylaxis     makese her serverly sick    Etodolac NO Steroids unless approved by Attending Physician    Symbicort [Budesonide-Formoterol]     Codeine  Other Adverse Reaction (Add comment) and Nausea/ Vomiting     Stomach pain  Stomach pain        Home Medications:    Current Outpatient Medications:     acetaminophen (TYLENOL ARTHRITIS PAIN) 650 mg Oral Tablet Sustained Release, Take 2 Tablets (1,300 mg total) by mouth Twice per day as needed for Pain, Disp: , Rfl:     amLODIPine (NORVASC) 10 mg Oral Tablet, Take 1 Tablet (10 mg total) by mouth Once a day, Disp: 90 Tablet, Rfl: 4    atorvastatin (LIPITOR) 20 mg Oral Tablet, Take 1 Tablet (20 mg total) by mouth Every evening, Disp: , Rfl:     celecoxib (CELEBREX) 200 mg Oral Capsule, Take 1 Capsule (200 mg total) by mouth Once per day as needed for Pain, Disp: , Rfl:     ciprofloxacin HCl (CIPRO) 500 mg Oral Tablet, Take 1 Tablet (500 mg total) by mouth Twice daily for 7 days, Disp: 14 Tablet, Rfl: 0    clopidogreL (PLAVIX) 75 mg Oral Tablet, TAKE ONE TABLET BY MOUTH DAILY, Disp: 90 Tablet, Rfl: 3    CREON 36,000-114,000- 180,000 unit Oral Capsule, Delayed Release(E.C.), 1 Capsule Twice daily, Disp: , Rfl:     ergocalciferol, vitamin D2, (DRISDOL) 1,250 mcg (50,000 unit) Oral Capsule, Take 1 Capsule (50,000 Units total) by mouth Every MON and FRI for 336 days Takes on Fridays, Disp: 96 Capsule, Rfl: 0    ezetimibe (ZETIA) 10 mg Oral Tablet, TAKE ONE TABLET BY MOUTH DAILY, Disp: 90 Tablet, Rfl: 3    folic acid (FOLVITE) 1 mg Oral Tablet, Take 1 Tablet (1 mg total) by mouth Once a day, Disp: , Rfl:     gabapentin (NEURONTIN) 100 mg Oral Capsule, Take 1 Capsule (100 mg total) by mouth Twice daily, Disp: 180 Capsule, Rfl: 1    gemfibroziL (LOPID) 600 mg Oral Tablet, Take 1 Tablet (600 mg total) by mouth Once a day for 360  days, Disp: 90 Tablet, Rfl: 3    losartan (COZAAR) 50 mg Oral Tablet, Take 1 Tablet (50 mg total) by mouth Twice daily for 90 days, Disp: 180 Tablet, Rfl: 1    methotrexate 2.5 mg Oral tablet, Take 1 Tablet (2.5 mg total) by mouth Every 7 days Tanes on Thursdays, Disp: , Rfl:     metoprolol succinate (TOPROL-XL) 100 mg Oral Tablet Sustained Release 24 hr, Take 1 Tablet (100 mg total) by mouth Once a day, Disp: 90 Tablet, Rfl: 4    nitroGLYCERIN (NITROSTAT) 0.4 mg Sublingual Tablet, Sublingual, Place 1 Tablet (0.4 mg total) under the tongue Every 5 minutes as needed for Chest pain (after three doses, call ER if pain still  persists) for 3 doses over 15 minutes, Disp: 25 Tablet, Rfl: 5    oxyBUTYnin chloride (DITROPAN XL) 10 mg Oral Tablet Extended Rel 24 hr, Take 1 Tablet (10 mg total) by mouth Once a day for 90 days (Patient taking differently: Take 5 mg by mouth Once a day), Disp: 90 Tablet, Rfl: 0    sertraline (ZOLOFT) 25 mg Oral Tablet, Take 0.5 Tablets (12.5 mg total) by mouth Once a day for 90 days, Disp: 45 Tablet, Rfl: 0    sodium bicarbonate 650 mg Oral Tablet, Take 1 Tablet (650 mg total) by mouth Twice daily, Disp: 180 Tablet, Rfl: 3       Review of Systems:   All systems have been reviewed and found to be negative except for as stated above in the history of present illness.     Physical Examination:   Vitals: BP (!) 140/74   Pulse 70   Temp 36.4 C (97.6 F)   Wt 49.9 kg (110 lb)   SpO2 95%   BMI 17.75 kg/m     General:  No apparent distress.  Family at bedside.  Interactive.  Thin and frail.   HEENT:  Atraumatic, normocephalic.  Sclera and conjunctiva are clear. Nares patent without lesion or discharge. Mucous membranes moist. Tongue protrudes midline. No tonsillar erythema or exudates.  Neck:  Supple. Trachea is midline. No JVD. No adenopathy. No thyromegaly. No carotid bruits.  Respiratory: No respiratory distress. Lungs clear to auscultation bilaterally. No wheezes, rales, or  rhonchi.  Cardiovascular: Regular rate and rhythm. S1, S2 present. No murmurs, rubs, or gallops. Pulses are +2 and equal in all extremities.  Abdomen:  Soft and non-distended.  Non-tender. Bowel sounds present and normoactive in all quadrants. No hepatosplenomegaly. No guarding or rebound tenderness.  Extremities:  No clubbing, cyanosis or edema.   Psychiatric:  Mood and affect appropriate. Memory intact with good short- and long-term recall. Judgement and insight appear appropriate. No suicidal or homicidal ideation.    Assessment:   (I10) Primary hypertension  (primary encounter diagnosis)  Plan: CBC, COMPREHENSIVE METABOLIC PANEL, NON-FASTING    (E55.9) Vitamin D deficiency  Plan: COMPREHENSIVE METABOLIC PANEL, NON-FASTING,         VITAMIN D 25 TOTAL, PARATHYROID HORMONE (PTH)    (E83.39) Hyperphosphatemia  Plan: VITAMIN D 25 TOTAL, PARATHYROID HORMONE (PTH),         PHOSPHORUS    (N39.0) Recurrent UTI (urinary tract infection)  Plan: URINALYSIS, MACROSCOPIC AND MICROSCOPIC         W/CULTURE REFLEX    (F41.9) Anxiety     Plan:   Home readings look great and the office reading is better than usual today. Continue on losartan 50 mg oral twice daily, Toprol XL 100 mg oral daily, and amlodipine 10 mg oral daily. Continue to monitor at home and follow up in 1 month for recheck.   Vitamin D has fallen back down to 26 from 28.9 three months ago. Increase ergocalciferol to 50,000 Units twice weekly. Recheck in 3 months.   She has been on PhosLo in the past. Her phosphorus is mildly elevated at 4.3 today. I am hopeful that normalization of the PTH and vitamin D will correct this. Hold off on treatment. Recheck in 3 months.   Complete course of Cipro. Recheck urinalysis 1 week after completion. Follow up with Urology.   Initially she had refused medication, but daughter messaged me later and states that she will agree to treatment. Start on Zoloft 12.5mg  oral  daily. Recheck at appointment next month.      Patient was  counseled on diagnosis, follow-up, and side effect of medications.  Reasons for follow up in clinic or emergency department were discussed.    Orders:    Orders Placed This Encounter    URINALYSIS, MACROSCOPIC AND MICROSCOPIC W/CULTURE REFLEX    CBC    COMPREHENSIVE METABOLIC PANEL, NON-FASTING    VITAMIN D 25 TOTAL    PARATHYROID HORMONE (PTH)    PHOSPHORUS    ergocalciferol, vitamin D2, (DRISDOL) 1,250 mcg (50,000 unit) Oral Capsule    sertraline (ZOLOFT) 25 mg Oral Tablet      Referrals:   Orders Placed This Encounter   No orders of the following type(s) were placed in this encounter: Outpatient Referral.      Discharge Medication List:       Current Discharge Medication List            Accurate as of May 04, 2022 11:59 PM. If you have any questions, ask your nurse or doctor.                START taking these medications.        Details   sertraline 25 mg Tablet  Commonly known as: ZOLOFT  Started by: Harjot Zavadil Hill-Reinert, DO   12.5 mg, Oral, DAILY  Qty: 45 Tablet  Refills: 0            CONTINUE these medications which have CHANGED during your visit.        Details   ergocalciferol (vitamin D2) 1,250 mcg (50,000 unit) Capsule  Commonly known as: DRISDOL  What changed: when to take this  Changed by: [facility], DO   50,000 Units, Oral, EVERY MO AND FR, Takes on Fridays  Qty: 96 Capsule  Refills: 0     oxyBUTYnin chloride 10 mg Tablet Extended Rel 24 hr  Commonly known as: DITROPAN XL  What changed: how much to take   10 mg, Oral, DAILY  Qty: 90 Tablet  Refills: 0            CONTINUE these medications - NO CHANGES were made during your visit.        Details   acetaminophen 650 mg Tablet Sustained Release  Commonly known as: TYLENOL ARTHRITIS PAIN   1,300 mg, Oral, 2 TIMES DAILY PRN  Refills: 0     amLODIPine 10 mg Tablet  Commonly known as: NORVASC   10 mg, Oral, DAILY  Qty: 90 Tablet  Refills: 4     atorvastatin 20 mg Tablet  Commonly known as: LIPITOR   20 mg, Oral, EVERY EVENING  Refills: 0      calcitrioL 0.5 mcg Capsule  Commonly known as: ROCALTROL   0.5 mcg, Oral, DAILY  Qty: 90 Capsule  Refills: 3     celecoxib 200 mg Capsule  Commonly known as: CeleBREX   200 mg, Oral, DAILY PRN  Refills: 0     ciprofloxacin HCl 500 mg Tablet  Commonly known as: CIPRO   500 mg, Oral, 2 TIMES DAILY  Qty: 14 Tablet  Refills: 0     clopidogreL 75 mg Tablet  Commonly known as: PLAVIX   TAKE ONE TABLET BY MOUTH DAILY  Qty: 90 Tablet  Refills: 3     Creon 36,000-114,000- 180,000 unit Capsule, Delayed Release(E.C.)  Generic drug: lipase-protease-amylase   1 Capsule, 2 TIMES DAILY  Refills: 0     ezetimibe 10 mg Tablet  Commonly known as:  ZETIA   TAKE ONE TABLET BY MOUTH DAILY  Qty: 90 Tablet  Refills: 3     folic acid 1 mg Tablet  Commonly known as: FOLVITE   1 mg, Oral, DAILY  Refills: 0     gabapentin 100 mg Capsule  Commonly known as: NEURONTIN   100 mg, Oral, 2 TIMES DAILY  Qty: 180 Capsule  Refills: 1     gemfibroziL 600 mg Tablet  Commonly known as: LOPID   600 mg, Oral, DAILY  Qty: 90 Tablet  Refills: 3     losartan 50 mg Tablet  Commonly known as: COZAAR   50 mg, Oral, 2 TIMES DAILY  Qty: 180 Tablet  Refills: 1     methotrexate 2.5 mg tablet   2.5 mg, Oral, EVERY 7 DAYS, Tanes on Thursdays  Refills: 0     metoprolol succinate 100 mg Tablet Sustained Release 24 hr  Commonly known as: TOPROL-XL   100 mg, Oral, DAILY  Qty: 90 Tablet  Refills: 4     nitroGLYCERIN 0.4 mg Tablet, Sublingual  Commonly known as: NITROSTAT   0.4 mg, Sublingual, EVERY 5 MIN PRN, for 3 doses over 15 minutes  Qty: 25 Tablet  Refills: 5     sodium bicarbonate 650 mg Tablet   650 mg, Oral, 2 TIMES DAILY  Qty: 180 Tablet  Refills: 3                 Follow up: Return in about 1 month (around 06/03/2022) for AWV and BP check. Patient was advised to follow up sooner if issues arise.     Dayvion Sans Hill-Reinert, DO    This note was partially created using M*Modal fluency direct system (voice recognition software ) and is inherently subject to errors  including those of syntax and "sound- alike" substitutions which may escape proofreading.  In such instances, original meaning may be extrapolated by contextual derivation.

## 2022-05-07 ENCOUNTER — Encounter (INDEPENDENT_AMBULATORY_CARE_PROVIDER_SITE_OTHER): Payer: Self-pay | Admitting: Internal Medicine

## 2022-05-07 DIAGNOSIS — Z950 Presence of cardiac pacemaker: Secondary | ICD-10-CM

## 2022-05-07 DIAGNOSIS — E782 Mixed hyperlipidemia: Secondary | ICD-10-CM

## 2022-05-07 DIAGNOSIS — I2510 Atherosclerotic heart disease of native coronary artery without angina pectoris: Secondary | ICD-10-CM

## 2022-05-07 DIAGNOSIS — I480 Paroxysmal atrial fibrillation: Secondary | ICD-10-CM

## 2022-05-07 DIAGNOSIS — N390 Urinary tract infection, site not specified: Secondary | ICD-10-CM | POA: Insufficient documentation

## 2022-05-07 DIAGNOSIS — Z8673 Personal history of transient ischemic attack (TIA), and cerebral infarction without residual deficits: Secondary | ICD-10-CM

## 2022-05-07 DIAGNOSIS — F419 Anxiety disorder, unspecified: Secondary | ICD-10-CM | POA: Insufficient documentation

## 2022-05-07 MED ORDER — SERTRALINE 25 MG TABLET
12.5000 mg | ORAL_TABLET | Freq: Every day | ORAL | 0 refills | Status: DC
Start: 2022-05-07 — End: 2022-06-20

## 2022-05-08 ENCOUNTER — Other Ambulatory Visit (INDEPENDENT_AMBULATORY_CARE_PROVIDER_SITE_OTHER): Payer: Self-pay

## 2022-05-14 ENCOUNTER — Other Ambulatory Visit: Payer: Medicare Other | Attending: Internal Medicine

## 2022-05-14 ENCOUNTER — Ambulatory Visit (INDEPENDENT_AMBULATORY_CARE_PROVIDER_SITE_OTHER): Payer: Self-pay | Admitting: Orthopaedic Surgery

## 2022-05-14 ENCOUNTER — Telehealth (INDEPENDENT_AMBULATORY_CARE_PROVIDER_SITE_OTHER): Payer: Self-pay | Admitting: Internal Medicine

## 2022-05-14 ENCOUNTER — Other Ambulatory Visit: Payer: Self-pay

## 2022-05-14 DIAGNOSIS — N390 Urinary tract infection, site not specified: Secondary | ICD-10-CM | POA: Insufficient documentation

## 2022-05-14 LAB — URINALYSIS, MACROSCOPIC
BILIRUBIN: NEGATIVE mg/dL
BLOOD: NEGATIVE mg/dL
GLUCOSE: NEGATIVE mg/dL
KETONES: NEGATIVE mg/dL
LEUKOCYTES: NEGATIVE WBCs/uL
NITRITE: NEGATIVE
PH: 7 (ref 4.6–8.0)
PROTEIN: 30 mg/dL — AB
SPECIFIC GRAVITY: 1.02 (ref 1.005–1.030)
UROBILINOGEN: 0.2 mg/dL (ref 0.2–1.0)

## 2022-05-14 LAB — URINALYSIS, MICROSCOPIC

## 2022-05-14 NOTE — Telephone Encounter (Signed)
Pt daughter called in and asked if provider could put in a repeat UA for pt. She stated she is getting up estimated 6 to 7 times a night and is getting zero sleep quality. She stated she was in town and would like to bring her in today, she just wondered if provider can put in an order. Pt asked for a return call back at [phone].    Comments added by Debarah Crape on 05/14/22 at 08:17.

## 2022-05-15 NOTE — Telephone Encounter (Signed)
Called and spoke with pt. Pt advised me she had the UA completed yesterday and it was fine. She stated she is still having the same issues and just worried because she doesn't know what is wrong.    Comments added by Debarah Crape on 05/15/22 at 07:56.

## 2022-05-16 ENCOUNTER — Telehealth (INDEPENDENT_AMBULATORY_CARE_PROVIDER_SITE_OTHER): Payer: Self-pay | Admitting: Internal Medicine

## 2022-05-16 NOTE — Telephone Encounter (Signed)
-----   Message from [facility], DO sent at 05/14/2022  6:59 PM EDT -----  Let Theresa Greene or her daughter know that the urinalysis is normal. No infection at this time.

## 2022-05-16 NOTE — Telephone Encounter (Signed)
Notified pt of results, pt verbalized understanding.  

## 2022-05-25 ENCOUNTER — Other Ambulatory Visit: Payer: Self-pay

## 2022-05-25 ENCOUNTER — Ambulatory Visit (INDEPENDENT_AMBULATORY_CARE_PROVIDER_SITE_OTHER): Payer: Medicare Other | Admitting: Cardiovascular Disease

## 2022-05-25 ENCOUNTER — Encounter (HOSPITAL_BASED_OUTPATIENT_CLINIC_OR_DEPARTMENT_OTHER): Payer: Self-pay | Admitting: Cardiovascular Disease

## 2022-05-25 ENCOUNTER — Inpatient Hospital Stay
Admission: RE | Admit: 2022-05-25 | Discharge: 2022-05-25 | Disposition: A | Discharge status: Home / Self Care | Payer: Medicare Other | Source: Ambulatory Visit | Attending: Cardiovascular Disease | Admitting: Cardiovascular Disease

## 2022-05-25 VITALS — BP 110/80 | HR 70 | Ht 63.0 in | Wt 108.0 lb

## 2022-05-25 DIAGNOSIS — I480 Paroxysmal atrial fibrillation: Secondary | ICD-10-CM

## 2022-05-25 DIAGNOSIS — Z9861 Coronary angioplasty status: Secondary | ICD-10-CM

## 2022-05-25 DIAGNOSIS — Z8673 Personal history of transient ischemic attack (TIA), and cerebral infarction without residual deficits: Secondary | ICD-10-CM

## 2022-05-25 DIAGNOSIS — Z950 Presence of cardiac pacemaker: Secondary | ICD-10-CM

## 2022-05-25 DIAGNOSIS — I2510 Atherosclerotic heart disease of native coronary artery without angina pectoris: Secondary | ICD-10-CM

## 2022-05-25 DIAGNOSIS — R0600 Dyspnea, unspecified: Secondary | ICD-10-CM

## 2022-05-25 DIAGNOSIS — R296 Repeated falls: Secondary | ICD-10-CM

## 2022-05-25 DIAGNOSIS — I10 Essential (primary) hypertension: Secondary | ICD-10-CM

## 2022-05-25 DIAGNOSIS — E782 Mixed hyperlipidemia: Secondary | ICD-10-CM

## 2022-05-25 NOTE — H&P (Signed)
CARDIOLOGY, MEDICAL OFFICE BUILDING D  [place]  [place] [address]    History and Physical     Name: Theresa Greene MRN:  [id number]   Date: 05/25/2022 Age: 87 y.o.       Chief Complaint: New Patient (HISTORY OF CARDIAC PACEMAKER)    History of Present Illness   HPI  This is an 87 year old female who I was asked to see in the office.  The patient had had her cardiac care in [place] but wished to switch the someone closer to her residence.  The patient does live in [place].  The patient has a history of permanent pacemaker placement that has been placed in the remote past.  She states she is had 1 generator change out.  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  She is also had 1 cardiac stent placed in the remote past as well.  She is also had a history of TIA/CVA.  She does have a history of orthostatic hypotension as well as persistent atrial fibrillation.  She is maintained on amiodarone 100 mg daily.  She was taken off of anticoagulation due to fall risk.  The family and patient had the discussion about Watchman device but this was decided against.  The patient has been doing overall fairly well.  She denies any recent chest pain, increased shortness of breath, palpitations, orthopnea, or PND.      Patient Active Problem List    Diagnosis    Recurrent UTI (urinary tract infection)    Anxiety    Hyperphosphatemia    Pancreatic insufficiency    Frailty    Frequent falls    Moderate protein-calorie malnutrition (CMS HCC)    Osteoporosis    Iron deficiency anemia    Neuropathy (CMS HCC)    Vitamin D deficiency    Stage 3 chronic kidney disease (CMS HCC)    Hyperlipidemia    HTN (hypertension)    Rheumatoid arthritis (CMS HCC)    PUD (peptic ulcer disease)    History of CVA (cerebrovascular accident)    History of cardiac pacemaker    Gastroesophageal reflux disease    Charcot's joint of foot, left    CAD S/P percutaneous coronary angioplasty    Paroxysmal atrial fibrillation (CMS [facility])     Past Medical History:    Diagnosis Date    Arthritis     Asthma     Back problem     CAD S/P percutaneous coronary angioplasty 2010    Pt reports 5 years ago    Charcot's joint of foot, left 08/06/2014    Colon polyp     CVA (cerebrovascular accident) (CMS [place]) 1995    no residual deficits    Deaf     Diverticulitis of colon     Gastroesophageal reflux disease 08/06/2014    GI bleed     H/O hearing loss     History of cardiac pacemaker 08/06/2014    HTN (hypertension)     Hyperlipidemia 12/25/2019    Muscle weakness     Neuropathy (CMS HCC)     Pacemaker     Paroxysmal atrial fibrillation (CMS HCC) 08/06/2014    PUD (peptic ulcer disease) 08/06/2014    C/b GIB Spring 2015    Rheumatoid arthritis (CMS [facility]) 08/06/2014    Stage 3 chronic kidney disease (CMS [facility]) 12/25/2019    Last Assessment & Plan:  Renal function stable, at baseline Stable electrolytes; restart sodium bicarb 650 mg BID  Continue renal protective  ARB Discussed CKD risk factor modifications, including tight BP/glucose control, low protein diet, and avoidance of NSAIDs    Stented coronary artery     Vitamin D deficiency 12/25/2019    Wears glasses          Past Surgical History:   Procedure Laterality Date    ANKLE SURGERY Left     repair with metal    CATARACT EXTRACTION      unsure which eye but thinks she only had one eye done    CORONARY ARTERY ANGIOPLASTY  2011    ESOPHAGOGASTRODUODENOSCOPY      HIP SURGERY Right     repair with metal    HX BACK SURGERY      lower back x 3 no metal    HX COLONOSCOPY      HX HIP REPLACEMENT Left     HX NISSEN FUNDOPLICATION  2002    then three others to repair problems    HX OPEN CHOLECYSTECTOMY  1962    with common duxt exploration    HX PACEMAKER INSERTION  2019    HX SHOULDER SURGERY Bilateral     scopes    KNEE ARTHROPLASTY Bilateral     ORIF DISTAL FEMUR FRACTURE Right     OTHER SURGICAL HISTORY Right     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    PANCREATIC PSEUDOCYST DRAINAGE  1971    TOTAL ABDOMINAL HYSTERECTOMY            Current Outpatient Medications   Medication Sig    acetaminophen (TYLENOL ARTHRITIS PAIN) 650 mg Oral Tablet Sustained Release Take 2 Tablets (1,300 mg total) by mouth Twice per day as needed for Pain    amiodarone (PACERONE) 100 mg Oral Tablet Take 1 Tablet (100 mg total) by mouth Every morning with breakfast    amLODIPine (NORVASC) 10 mg Oral Tablet Take 1 Tablet (10 mg total) by mouth Once a day    atorvastatin (LIPITOR) 20 mg Oral Tablet Take 1 Tablet (20 mg total) by mouth Every evening    calcitrioL (ROCALTROL) 0.5 mcg Oral Capsule Take 1 Capsule (0.5 mcg total) by mouth Once a day for 90 days (Patient taking differently: Take 0.25 mcg by mouth Once a day)    celecoxib (CELEBREX) 200 mg Oral Capsule Take 1 Capsule (200 mg total) by mouth Once per day as needed for Pain    clopidogreL (PLAVIX) 75 mg Oral Tablet TAKE ONE TABLET BY MOUTH DAILY    CREON 36,000-114,000- 180,000 unit Oral Capsule, Delayed Release(E.C.) 1 Capsule Twice daily    Dexlansoprazole (DEXILANT) 60 mg Oral Cap, Delayed Rel., Multiphasic Take 1 Capsule (60 mg total) by mouth Once a day    ergocalciferol, vitamin D2, (DRISDOL) 1,250 mcg (50,000 unit) Oral Capsule Take 1 Capsule (50,000 Units total) by mouth Every MON and FRI for 336 days Takes on Fridays    ezetimibe (ZETIA) 10 mg Oral Tablet TAKE ONE TABLET BY MOUTH DAILY    folic acid (FOLVITE) 1 mg Oral Tablet Take 1 Tablet (1 mg total) by mouth Once a day    gabapentin (NEURONTIN) 100 mg Oral Capsule Take 1 Capsule (100 mg total) by mouth Twice daily    gemfibroziL (LOPID) 600 mg Oral Tablet Take 1 Tablet (600 mg total) by mouth Once a day for 360 days    losartan (COZAAR) 50 mg Oral Tablet Take 1 Tablet (50 mg total) by mouth Twice daily for 90 days  LYSINE ORAL Take 100 mg by mouth Once per day as needed    melatonin 10 mg Oral Tablet Take 1 Tablet (10 mg total) by mouth Every night    methotrexate 2.5 mg Oral tablet Take 1 Tablet (2.5 mg total) by mouth Every 7 days Tanes on  Thursdays    metoprolol succinate (TOPROL-XL) 100 mg Oral Tablet Sustained Release 24 hr Take 1 Tablet (100 mg total) by mouth Once a day    nitroGLYCERIN (NITROSTAT) 0.4 mg Sublingual Tablet, Sublingual Place 1 Tablet (0.4 mg total) under the tongue Every 5 minutes as needed for Chest pain (after three doses, call ER if pain still persists) for 3 doses over 15 minutes    oxyBUTYnin chloride (DITROPAN XL) 10 mg Oral Tablet Extended Rel 24 hr Take 1 Tablet (10 mg total) by mouth Once a day for 90 days (Patient not taking: Reported on 05/25/2022)    sertraline (ZOLOFT) 25 mg Oral Tablet Take 0.5 Tablets (12.5 mg total) by mouth Once a day for 90 days    sodium bicarbonate 650 mg Oral Tablet Take 1 Tablet (650 mg total) by mouth Twice daily     Allergies   Allergen Reactions    Iodinated Contrast Media Anaphylaxis    Iv Contrast Anaphylaxis     makese her serverly sick    Etodolac NO Steroids unless approved by Attending Physician    Symbicort [Budesonide-Formoterol]     Codeine  Other Adverse Reaction (Add comment) and Nausea/ Vomiting     Stomach pain  Stomach pain       Family Medical History:       Problem Relation (Age of Onset)    Brain cancer Mother    Ovarian Cancer Mother    Pancreatic Cancer Father            Social History     Tobacco Use    Smoking status: Never    Smokeless tobacco: Never   Substance Use Topics    Alcohol use: Never      Review of Systems  Review of Systems   Constitutional:  Negative for chills, diaphoresis, fatigue, fever and unexpected weight change.   HENT:  Negative for ear pain, hearing loss, nosebleeds, rhinorrhea and tinnitus.    Respiratory:  Negative for cough, chest tightness, shortness of breath, wheezing and stridor.    Cardiovascular:  Negative for chest pain, palpitations and leg swelling.   Gastrointestinal:  Negative for abdominal distention, abdominal pain, blood in stool, constipation, diarrhea, nausea and vomiting.   Genitourinary:  Negative for decreased urine volume,  difficulty urinating, dysuria and frequency.   Musculoskeletal:  Positive for arthralgias and gait problem. Negative for myalgias.   Skin:  Negative for color change, pallor and rash.   Neurological:  Negative for dizziness, seizures, syncope, weakness, light-headedness, numbness and headaches.   Hematological:  Negative for adenopathy. Does not bruise/bleed easily.   Psychiatric/Behavioral:  Negative for agitation, behavioral problems and sleep disturbance. The patient is not nervous/anxious.      Examination:  BP 110/80 Comment: RTA  Pulse 70   Ht 1.6 m (5\' 3" )   Wt 49 kg (108 lb)   SpO2 97%   BMI 19.13 kg/m       Physical Exam  Constitutional:       General: She is not in acute distress.     Appearance: She is not diaphoretic.   HENT:      Head: Normocephalic and atraumatic.   Eyes:  Conjunctiva/sclera: Conjunctivae normal.      Pupils: Pupils are equal, round, and reactive to light.   Cardiovascular:      Rate and Rhythm: Normal rate and regular rhythm.      Heart sounds: Normal heart sounds. No murmur heard.     No friction rub. No gallop.   Pulmonary:      Effort: Pulmonary effort is normal. No respiratory distress.      Breath sounds: Normal breath sounds. No wheezing or rales.   Chest:      Chest wall: No tenderness.   Abdominal:      General: Bowel sounds are normal. There is no distension.      Palpations: Abdomen is soft.      Tenderness: There is no abdominal tenderness. There is no rebound.   Musculoskeletal:         General: Normal range of motion.      Cervical back: Normal range of motion and neck supple.   Skin:     General: Skin is warm and dry.   Neurological:      Mental Status: She is alert and oriented to person, place, and time.   Psychiatric:         Judgment: Judgment normal.           Assessment and Plan  Problem List Items Addressed This Visit          Cardiovascular System    Hyperlipidemia (Chronic)    Relevant Orders    EKG (today in clinic) (Completed)    History of cardiac  pacemaker (Chronic)    Relevant Orders    DEVICE CHECK    Refer to [facility]) Device Clinic    CAD S/P percutaneous coronary angioplasty (Chronic)    Relevant Orders    DEVICE CHECK    Refer to [facility]) Device Clinic    HTN (hypertension) (Chronic)    Paroxysmal atrial fibrillation (CMS HCC) (Chronic)    Relevant Orders    EKG (today in clinic) (Completed)    DEVICE CHECK    Refer to [facility]) Device Clinic       Neurologic    History of CVA (cerebrovascular accident) (Chronic)    Relevant Orders    DEVICE CHECK    Refer to [facility]) Device Clinic       Other    Frequent falls (Chronic)     Other Visit Diagnoses       Dyspnea, unspecified type    -  Primary    Relevant Orders    PULMONARY FUNCTION TESTING - ADULT          1. History of permanent pacemaker placement:  The patient will be placed in our pacemaker clinic.  She will have her pacemaker check today.  She will have periodic pacemaker checks in the future.    2. History of atrial fibrillation, persistent:  The patient does continue on amiodarone 100 mg daily which have been ordered by her previous cardiologist.  We will order a pulmonary function test in [place].  She will continue to have lab work performed by her primary care provider.    3. Coronary artery disease:  The patient is status post percutaneous intervention in the past.  She will continue with risk factor modification and medical therapy.  She is not having any significant angina or increased shortness of breath at this time.    4. Hyperlipidemia: The patient will continue on statin therapy.  She does get  frequent lab work by her primary care provider.    5. Hypertension:  The patient will continue on her current antihypertensive medications.  Her blood pressure has been under fair control.    6. History of CVA    I will see her back in 6 months in [place], sooner if needed.     Georga Hacking, MD

## 2022-05-28 DIAGNOSIS — E782 Mixed hyperlipidemia: Secondary | ICD-10-CM

## 2022-05-28 DIAGNOSIS — I480 Paroxysmal atrial fibrillation: Secondary | ICD-10-CM

## 2022-05-28 LAB — ECG W INTERP (AMB USE ONLY)(MUSE,IN CLINIC)
Atrial Rate: 70 {beats}/min
Calculated R Axis: 83 degrees
Calculated T Axis: 75 degrees
QRS Duration: 96 ms
QT Interval: 428 ms
QTC Calculation: 462 ms
Ventricular rate: 70 {beats}/min

## 2022-05-29 ENCOUNTER — Other Ambulatory Visit (HOSPITAL_COMMUNITY): Payer: Self-pay | Admitting: Cardiovascular Disease

## 2022-05-29 ENCOUNTER — Ambulatory Visit (INDEPENDENT_AMBULATORY_CARE_PROVIDER_SITE_OTHER): Payer: Self-pay | Admitting: Orthopaedic Surgery

## 2022-05-29 DIAGNOSIS — R0600 Dyspnea, unspecified: Secondary | ICD-10-CM

## 2022-06-06 ENCOUNTER — Other Ambulatory Visit: Payer: Self-pay

## 2022-06-06 ENCOUNTER — Inpatient Hospital Stay
Admission: RE | Admit: 2022-06-06 | Discharge: 2022-06-06 | Disposition: A | Discharge status: Home / Self Care | Payer: Medicare Other | Source: Ambulatory Visit | Attending: Internal Medicine | Admitting: Internal Medicine

## 2022-06-06 DIAGNOSIS — J449 Chronic obstructive pulmonary disease, unspecified: Secondary | ICD-10-CM | POA: Insufficient documentation

## 2022-06-06 DIAGNOSIS — R0600 Dyspnea, unspecified: Secondary | ICD-10-CM | POA: Insufficient documentation

## 2022-06-06 MED ORDER — ALBUTEROL SULFATE 2.5 MG/3 ML (0.083 %) SOLUTION FOR NEBULIZATION
2.5000 mg | INHALATION_SOLUTION | RESPIRATORY_TRACT | Status: DC | PRN
Start: 2022-06-06 — End: 2022-06-07
  Administered 2022-06-06: 2.5 mg via RESPIRATORY_TRACT
  Filled 2022-06-06: qty 3

## 2022-06-07 ENCOUNTER — Other Ambulatory Visit (INDEPENDENT_AMBULATORY_CARE_PROVIDER_SITE_OTHER): Payer: Self-pay | Admitting: Internal Medicine

## 2022-06-07 ENCOUNTER — Ambulatory Visit (HOSPITAL_BASED_OUTPATIENT_CLINIC_OR_DEPARTMENT_OTHER): Payer: Self-pay

## 2022-06-07 MED ORDER — DEXLANSOPRAZOLE 60 MG CAPSULE,BIPHASE DELAYED RELEASE
60.0000 mg | DELAYED_RELEASE_CAPSULE | Freq: Every day | ORAL | 1 refills | Status: DC
Start: 2022-06-07 — End: 2022-11-12

## 2022-06-07 NOTE — Telephone Encounter (Signed)
Called and reviewed PFT results with pt. Encouraged pt to contact office with any further questions or concerns. Pt v/u. Grayland Jack, RN

## 2022-06-07 NOTE — Procedures (Signed)
Theresa Greene, GALENTINE [facility] NUMBER:  [id number]  DATE OF SERVICE:  06/06/2022  DOB:  [birth year]  SEX:  F      CONCLUSION:  Mild obstructive lung disease.        Lubertha Sayres, MD            DD:  06/06/2022 16:37:40  DT:  06/07/2022 05:17:34 TAW  D#:  [id number]

## 2022-06-07 NOTE — Telephone Encounter (Signed)
Called and left a VM needing a refill.

## 2022-06-07 NOTE — Procedures (Signed)
NAMEALEDA, MADL [facility] NUMBER:  [id number]  DATE OF SERVICE:  06/06/2022  DOB:  Aug 19, 1934  SEX:  F      FEV1 pre and post bronchodilation: 120 and 130%, respectively.  FVC pre and post bronchodilation:  122 and 124%, respectively.  Pre and post FEV1 to FVC ratio: 67 and 71%, respectively.  TLC: 87%.  DLCO: 108%.    INTERPRETATION:  At best, this may suggest a mild obstructive ventilatory defect.  Given patient's age, however, this could be a normal pulmonary function test.  Clinical correlation is indicated.        Guinevere Ferrari, MD            DD:  06/06/2022 15:52:14  DT:  06/07/2022 05:23:50 TAW  D#:  [id number]

## 2022-06-11 ENCOUNTER — Ambulatory Visit: Payer: Medicare Other | Attending: Orthopaedic Surgery | Admitting: Orthopaedic Surgery

## 2022-06-11 ENCOUNTER — Ambulatory Visit (HOSPITAL_COMMUNITY): Payer: Medicare Other

## 2022-06-11 ENCOUNTER — Other Ambulatory Visit: Payer: Self-pay

## 2022-06-11 VITALS — BP 130/82 | HR 70 | Temp 96.6°F | Resp 18 | Ht 63.0 in | Wt 108.0 lb

## 2022-06-11 DIAGNOSIS — M25511 Pain in right shoulder: Secondary | ICD-10-CM | POA: Insufficient documentation

## 2022-06-11 DIAGNOSIS — M25512 Pain in left shoulder: Secondary | ICD-10-CM | POA: Insufficient documentation

## 2022-06-11 DIAGNOSIS — M75101 Unspecified rotator cuff tear or rupture of right shoulder, not specified as traumatic: Secondary | ICD-10-CM | POA: Insufficient documentation

## 2022-06-11 DIAGNOSIS — Z9889 Other specified postprocedural states: Secondary | ICD-10-CM

## 2022-06-11 MED ORDER — TRIAMCINOLONE ACETONIDE 40 MG/ML SUSPENSION FOR INJECTION
40.0000 mg | INTRAMUSCULAR | Status: AC
Start: 2022-06-11 — End: 2022-06-11
  Administered 2022-06-11: 40 mg via INTRA_ARTICULAR

## 2022-06-11 MED ORDER — LIDOCAINE HCL 10 MG/ML (1 %) INJECTION SOLUTION
4.0000 mL | Freq: Once | INTRAMUSCULAR | Status: AC
Start: 2022-06-11 — End: 2022-06-11
  Administered 2022-06-11: 40 mg via INTRAMUSCULAR

## 2022-06-13 ENCOUNTER — Other Ambulatory Visit (INDEPENDENT_AMBULATORY_CARE_PROVIDER_SITE_OTHER): Payer: Self-pay | Admitting: Internal Medicine

## 2022-06-15 ENCOUNTER — Ambulatory Visit: Payer: Medicare Other | Attending: Internal Medicine | Admitting: Internal Medicine

## 2022-06-15 ENCOUNTER — Ambulatory Visit (HOSPITAL_COMMUNITY): Payer: Self-pay

## 2022-06-15 ENCOUNTER — Other Ambulatory Visit: Payer: Medicare Other | Attending: Internal Medicine

## 2022-06-15 ENCOUNTER — Encounter (INDEPENDENT_AMBULATORY_CARE_PROVIDER_SITE_OTHER): Payer: Self-pay | Admitting: Orthopaedic Surgery

## 2022-06-15 ENCOUNTER — Other Ambulatory Visit: Payer: Self-pay

## 2022-06-15 ENCOUNTER — Encounter (INDEPENDENT_AMBULATORY_CARE_PROVIDER_SITE_OTHER): Payer: Self-pay | Admitting: Internal Medicine

## 2022-06-15 VITALS — BP 138/80 | HR 75 | Temp 97.4°F | Ht 63.0 in | Wt 105.2 lb

## 2022-06-15 DIAGNOSIS — N390 Urinary tract infection, site not specified: Secondary | ICD-10-CM | POA: Insufficient documentation

## 2022-06-15 DIAGNOSIS — Z0000 Encounter for general adult medical examination without abnormal findings: Secondary | ICD-10-CM | POA: Insufficient documentation

## 2022-06-15 DIAGNOSIS — F419 Anxiety disorder, unspecified: Secondary | ICD-10-CM | POA: Insufficient documentation

## 2022-06-15 DIAGNOSIS — I10 Essential (primary) hypertension: Secondary | ICD-10-CM | POA: Insufficient documentation

## 2022-06-15 DIAGNOSIS — E559 Vitamin D deficiency, unspecified: Secondary | ICD-10-CM | POA: Insufficient documentation

## 2022-06-15 DIAGNOSIS — Z79899 Other long term (current) drug therapy: Secondary | ICD-10-CM | POA: Insufficient documentation

## 2022-06-15 DIAGNOSIS — N1832 Chronic kidney disease, stage 3b (CMS HCC): Secondary | ICD-10-CM

## 2022-06-15 LAB — URINALYSIS, MACROSCOPIC
BILIRUBIN: NEGATIVE mg/dL
BLOOD: NEGATIVE mg/dL
GLUCOSE: NEGATIVE mg/dL
KETONES: 15 mg/dL — AB
LEUKOCYTES: NEGATIVE WBCs/uL
NITRITE: NEGATIVE
PH: 6 (ref 4.6–8.0)
PROTEIN: 100 mg/dL — AB
SPECIFIC GRAVITY: 1.025 (ref 1.005–1.030)
UROBILINOGEN: 0.2 mg/dL (ref 0.2–1.0)

## 2022-06-15 LAB — COMPREHENSIVE METABOLIC PANEL, NON-FASTING
ALBUMIN: 3.5 g/dL (ref 3.4–4.8)
ALKALINE PHOSPHATASE: 101 U/L (ref 55–145)
ALT (SGPT): 14 U/L (ref 8–22)
ANION GAP: 12 mmol/L (ref 4–13)
AST (SGOT): 26 U/L (ref 8–45)
BILIRUBIN TOTAL: 0.3 mg/dL (ref 0.3–1.3)
BUN/CREA RATIO: 25 — ABNORMAL HIGH (ref 6–22)
BUN: 35 mg/dL — ABNORMAL HIGH (ref 8–25)
CALCIUM: 8.5 mg/dL — ABNORMAL LOW (ref 8.6–10.3)
CHLORIDE: 105 mmol/L (ref 96–111)
CO2 TOTAL: 23 mmol/L (ref 23–31)
CREATININE: 1.39 mg/dL — ABNORMAL HIGH (ref 0.60–1.05)
ESTIMATED GFR - FEMALE: 37 mL/min/BSA — ABNORMAL LOW (ref >=60)
GLUCOSE: 94 mg/dL (ref 65–125)
POTASSIUM: 4.8 mmol/L (ref 3.5–5.1)
PROTEIN TOTAL: 6.9 g/dL (ref 6.0–8.0)
SODIUM: 140 mmol/L (ref 136–145)

## 2022-06-15 LAB — URINALYSIS, MICROSCOPIC

## 2022-06-15 LAB — PHOSPHORUS: PHOSPHORUS: 4.1 mg/dL — ABNORMAL HIGH (ref 2.3–4.0)

## 2022-06-15 LAB — VITAMIN D 25 TOTAL: VITAMIN D 25, TOTAL: 34.3 ng/mL (ref 30.0–100.0)

## 2022-06-15 LAB — PARATHYROID HORMONE (PTH): PTH: 226.5 pg/mL — ABNORMAL HIGH (ref 8.5–77.0)

## 2022-06-15 MED ORDER — CALCITRIOL 0.5 MCG CAPSULE
0.5000 ug | ORAL_CAPSULE | Freq: Every day | ORAL | 3 refills | Status: DC
Start: 2022-06-15 — End: 2023-01-24

## 2022-06-15 NOTE — Procedures (Signed)
[facility]  [address]  [place] [address]  Operated by [facility]  Procedure Note    Name: Theresa Greene MRN:  [id number]   Date: 06/11/2022 Age: 87 y.o.  DOB:   1934/10/08       Joint Asp/Inj    Performed by: Albertina Senegal, APRN  Authorized by: Clemon Chambers, MD    Consent:     Consent obtained:  Verbal and written (The patient elected to proceed with a cortisone injection.)    Consent given by:  Patient    Risks discussed:  Bleeding, infection and pain (Risks, benefits, alternatives and potential complications of these were discussed.)    Alternatives discussed:  Alternative treatment and observation  Universal protocol:     Procedure explained and questions answered to patient or proxy's satisfaction: yes      Test results available and properly labeled: yes      Imaging studies available: yes      Immediately prior to procedure, a time out was called: yes      Patient identity confirmed:  Verbally with patient  Location:     Location:  Shoulder    Shoulder:  R subacromial bursa  Anesthesia (see MAR for exact dosages):     Anesthesia method:  Topical application    Topical anesthetic:  Ethyl chloride spray (Ethyl Chloride)  Procedure details:     Preparation: Patient was prepped and draped in usual sterile fashion      Approach:  Posterior    Steroid injected: yes (Kenalog 40mg  1% lidocaine 60mL)    Post-procedure details:     Dressing:  Adhesive bandage    Patient tolerance of procedure:  Tolerated well, no immediate complications      11m, APRN

## 2022-06-15 NOTE — Nursing Note (Signed)
06/15/22 1148   Depression Screen   Little interest or pleasure in doing things. 0   Feeling down, depressed, or hopeless 0   PHQ 2 Total 0

## 2022-06-15 NOTE — Progress Notes (Signed)
[facility]  Department of Orthopedics  [address] [place] [address]  Phone [phone]    Theresa Greene  Date of service: 06/11/2022    Reason for Visit: Rt shoulder cort inj    Subjective: 87 yo female back for above. Pt received prior rt shoulder SA cort inj in June which she states helped for a long time. Pt has missed several appts and 2 were due to having a vaccine and being on abx. Reports she is unable to move shoulder and is having increased pain currently.  Xrays rt shoulder showing anchors x3 c/w prior RC surgery, humeral head elevation and GH OA, all c/w RC arthropathy.  Pt reports that rt shoulder bothers her the most and has hard time using arm for any overhead activity.  Pt presents with her husband and using rolling walker. States she is not interested in a referral for any type of surgery due to her husband being on chemo. Denies any steroids, abx, or recent illnesses.      Objective:  Vitals:    06/11/22 0947   BP: 130/82   Pulse: 70   Resp: 18   Temp: (!) 35.9 C (96.6 F)   SpO2: 96%   Weight: 49 kg (108 lb)   Height: 1.6 m (5\' 3" )   BMI: 19.17         Body mass index is 19.13 kg/m.  General: alert, oriented, NAD  Rt shoulder: benign appearance, no signs of infection, decreased ROM    Radiographic/other Studies: no new    Assessment:    (M75.101,  M12.811) Right rotator cuff tear arthropathy  (primary encounter diagnosis)  Plan: Joint Asp/Inj    (M25.511,  M25.512) Bilateral shoulder pain, unspecified chronicity  Plan: Joint Asp/Inj    ) History of repair of rotator cuff  Plan: Joint Asp/Inj      Plan:  The patients diagnosis and treatment options, both non-surgical and surgical, were discussed at length.  Risks, benefits, alternatives and potential complications of these were discussed.  Findings of any radiographic studies or laboratory evaluation were discussed with the patient.  The  patient was given the opportunity to ask questions which were  then  answered to the best of my ability.  The patient was informed of any follow up care or further studies needed and showed understanding of these.  These actions were discussed with the patient.    Activity/Work Modification: as tolerated  DME/Cast/Splint: none  Additional Medical Record needed for review: unable to obtain past med record for shoulders  Medications: reviewed  Physical/Occupational Therapy: none  Radiographic Studies: reviewed  Therapeutic or Diagnostic Injections: here for above  Surgical Intervention: declined referral for surgical intervention  Referral: declined  Follow up: 4 months    [facility]  [address]  [place] [address] [place]  Operated by [facility]  Procedure Note    Name: Theresa Greene MRN:  Theresa Greene   Date: 06/11/2022 Age: 87 y.o.  DOB:   08/15/1934       Joint Asp/Inj    Performed by: 08/06/1937, APRN  Authorized by: Albertina Senegal, MD    Consent:     Consent obtained:  Verbal and written (The patient elected to proceed with a cortisone injection.)    Consent given by:  Patient    Risks discussed:  Bleeding, infection and pain (Risks, benefits, alternatives and potential complications of these were discussed.)    Alternatives discussed:  Alternative  treatment and observation  Universal protocol:     Procedure explained and questions answered to patient or proxy's satisfaction: yes      Test results available and properly labeled: yes      Imaging studies available: yes      Immediately prior to procedure, a time out was called: yes      Patient identity confirmed:  Verbally with patient  Location:     Location:  Shoulder    Shoulder:  R subacromial bursa  Anesthesia (see MAR for exact dosages):     Anesthesia method:  Topical application    Topical anesthetic:  Ethyl chloride spray (Ethyl Chloride)  Procedure details:     Preparation: Patient was prepped and draped in usual sterile fashion      Approach:  Posterior    Steroid injected: yes  (Kenalog 40mg  1% lidocaine 77mL)    Post-procedure details:     Dressing:  Adhesive bandage    Patient tolerance of procedure:  Tolerated well, no immediate complications      Tora Perches, APRN    Orders Placed This Encounter    Joint Asp/Inj    lidocaine 1% injection    triamcinolone acetonide (KENALOG-40) 40 mg/mL injection      Agree with above.  Gerrie Nordmann, MD

## 2022-06-15 NOTE — Nursing Note (Signed)
06/15/22 1147   Recent Weight Change   Have you had a recent unexplained weight loss or gain? Y   Health Education and Literacy   How often do you have a problem understanding what is told to you about your medical condition?  Occasionally   Domestic Violence   Because we are aware of abuse and domestic violence today, we ask all patients: Are you being hurt, hit, or frightened by anyone at your home or in your life?  N   Basic Needs   Do you have any basic needs within your home that are not being met? (such as Food, Shelter, Games developer, Tranportation, paying for bills and/or medications) N   Advanced Directives   Do you have any advanced directives? Living Will & MPOA   Do you have the Advanced Directive(s) so we can scan them to your chart? Theresa Greene

## 2022-06-15 NOTE — Progress Notes (Addendum)
[facility]  PEDIATRICS/INTERNAL MEDICINE, [place]  [address] [place] [address]  [phone]  06/20/2022     Medicare Annual Wellness Visit    Name: Theresa Greene MRN:  [id number]   Date: 06/15/2022 Age: 87 y.o.       SUBJECTIVE:   Theresa Greene is a 87 y.o. female presenting for Medicare Wellness examination. Currently they have no complaints or issues to discuss. I have reviewed and reconciled the medication list with the patient today.    Theresa Greene received some paperwork from her insurance that says she has to change from Creon to Zenpep for pancreatic insufficiency. She doesn't need this until January. Her daughter says that she is having the same symptoms again. She is urinating every hour during the night. She says that it improves slightly while she is on the antibiotics but then it re-occurs shortly after she stops them. She says that her solution was that she has been cutting back on her water intake. She has stopped trying to drink anything by 5PM in the evening. She says that her urine is dark. However, even after cutting her fluids back, she was still up every hour could not tell any difference. She has been referred to the urologist at [facility] and she sees them on the 14th or 15th of this month.     Her daughter says that after her blood pressure event, all of her providers have stressed the importance of compliance with her medication. She gets frustrated during this and shouts that she is taking her medications as they are prescribed. Her daughter says that she had been dizzy and light headed. Her blood pressure was running in the 120/60 range last Saturday. She says that she called her wanting to know what to do and she told her she needed to deal with things like this while her doctor was in the office. Ultimately, they held some of her medications. They restarted them and decreased her amlodipine back down to 28m due to  this. She brings in a list of blood pressure on this dosage and it seems to be stable.      Her daughter says that her mood has improved lately. Her husbands PET scan came back remarkably improved. She got past the fear of him dying on a fishing trip that their son-in-law took him on for a single day. She is doing fairly well this week. She still brings up what will happen to him if she passes first and what will happen to her if he passes first. She says she reads her bible and works on crossword puzzles. 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 She cooks and cleans the house still. She has some episodes of leaving the stove on. She has cut her thumb as well. Vision really limits her. Her daughter says that they filled the Zoloft and she put it into her medications and got started on it. However, her sister found it in the weekly stash and moved it back. She did not have any problem with it.     PHQ Total Score  PHQ 2 Total: 0          I have reviewed and updated as appropriate the past medical, family and social history. 06/15/2022 as summarized below:  Past Medical History:   Diagnosis Date    Arthritis     Asthma     Back problem  CAD S/P percutaneous coronary angioplasty 2010    Pt reports 5 years ago    Charcot's joint of foot, left 08/06/2014    Colon polyp     CVA (cerebrovascular accident) (CMS [place]) 1995    no residual deficits    Deaf     Diverticulitis of colon     Gastroesophageal reflux disease 08/06/2014    GI bleed     H/O hearing loss     History of cardiac pacemaker 08/06/2014    HTN (hypertension)     Hyperlipidemia 12/25/2019    Muscle weakness     Neuropathy (CMS HCC)     Pacemaker     Paroxysmal atrial fibrillation (CMS HCC) 08/06/2014    PUD (peptic ulcer disease) 08/06/2014    C/b GIB Spring 2015    Rheumatoid arthritis (CMS [place]) 08/06/2014    Stage 3 chronic kidney disease (CMS [place]) 12/25/2019    Last Assessment & Plan:  Renal function stable, at baseline Stable electrolytes;  restart sodium bicarb 650 mg BID  Continue renal protective ARB Discussed CKD risk factor modifications, including tight BP/glucose control, low protein diet, and avoidance of NSAIDs    Stented coronary artery     Vitamin D deficiency 12/25/2019    Wears glasses      Past Surgical History:   Procedure Laterality Date    Ankle surgery Left     Cataract extraction      Coronary artery angioplasty  2011    Esophagogastroduodenoscopy      Hip surgery Right     Hx back surgery      Hx colonoscopy      Hx hip replacement Left     Hx nissen fundoplication  6681    Hx open cholecystectomy  1962    Hx pacemaker insertion  2019    Hx shoulder surgery Bilateral     Knee arthroplasty Bilateral     Orif distal femur fracture Right     Other surgical history Right     Pancreatic pseudocyst drainage  1971    Total abdominal hysterectomy       Current Outpatient Medications   Medication Sig    acetaminophen (TYLENOL ARTHRITIS PAIN) 650 mg Oral Tablet Sustained Release Take 2 Tablets (1,300 mg total) by mouth Twice per day as needed for Pain    amiodarone (PACERONE) 100 mg Oral Tablet Take 1 Tablet (100 mg total) by mouth Every morning with breakfast    amLODIPine (NORVASC) 5 mg Oral Tablet Take 1 Tablet (5 mg total) by mouth Once a day    atorvastatin (LIPITOR) 20 mg Oral Tablet Take 1 Tablet (20 mg total) by mouth Every evening    calcitrioL (ROCALTROL) 0.5 mcg Oral Capsule Take 1 Capsule (0.5 mcg total) by mouth Once a day for 360 days    celecoxib (CELEBREX) 200 mg Oral Capsule Take 1 Capsule (200 mg total) by mouth Once per day as needed for Pain    clopidogreL (PLAVIX) 75 mg Oral Tablet TAKE ONE TABLET BY MOUTH DAILY    Dexlansoprazole (DEXILANT) 60 mg Oral Cap, Delayed Rel., Multiphasic Take 1 Capsule (60 mg total) by mouth Once a day for 180 days    ergocalciferol, vitamin D2, (DRISDOL) 1,250 mcg (50,000 unit) Oral Capsule Take 1 Capsule (50,000 Units total) by mouth Every MON and FRI for 336 days Takes on Fridays     ezetimibe (ZETIA) 10 mg Oral Tablet TAKE ONE TABLET BY MOUTH DAILY    folic acid (  FOLVITE) 1 mg Oral Tablet Take 1 Tablet (1 mg total) by mouth Once a day    gabapentin (NEURONTIN) 100 mg Oral Capsule Take 1 Capsule (100 mg total) by mouth Twice daily    gemfibroziL (LOPID) 600 mg Oral Tablet Take 1 Tablet (600 mg total) by mouth Once a day for 360 days    lipase-protease-amylase 5,000-17,000- 24,000 unit Oral Capsule, Delayed Release(E.C.) Take 1 Capsule by mouth Three times daily with meals for 360 days    losartan (COZAAR) 50 mg Oral Tablet Take 1 Tablet (50 mg total) by mouth Twice daily for 90 days    LYSINE ORAL Take 100 mg by mouth Once per day as needed    melatonin 10 mg Oral Tablet Take 1 Tablet (10 mg total) by mouth Every night    methotrexate 2.5 mg Oral tablet Take 1 Tablet (2.5 mg total) by mouth Every 7 days [place] on Thursdays    metoprolol succinate (TOPROL-XL) 100 mg Oral Tablet Sustained Release 24 hr Take 1 Tablet (100 mg total) by mouth Once a day    nitroGLYCERIN (NITROSTAT) 0.4 mg Sublingual Tablet, Sublingual Place 1 Tablet (0.4 mg total) under the tongue Every 5 minutes as needed for Chest pain (after three doses, call ER if pain still persists) for 3 doses over 15 minutes    oxyBUTYnin chloride (DITROPAN XL) 10 mg Oral Tablet Extended Rel 24 hr Take 1 Tablet (10 mg total) by mouth Once a day for 90 days (Patient not taking: Reported on 05/25/2022)    sertraline (ZOLOFT) 25 mg Oral Tablet Take 0.5 Tablets (12.5 mg total) by mouth Once a day for 90 days    sodium bicarbonate 650 mg Oral Tablet Take 1 Tablet (650 mg total) by mouth Twice daily     Family Medical History:       Problem Relation (Age of Onset)    Brain cancer Mother    Ovarian Cancer Mother    Pancreatic Cancer Father            Social History     Tobacco Use    Smoking status: Never    Smokeless tobacco: Never   Substance Use Topics    Alcohol use: Never      Social History     Substance and Sexual Activity   Drug Use Never       Social History     Social History Narrative    Lives at home with husband. 2 grown daughters - an Therapist, sports and a Ecologist. She did Librarian, academic for 23 years.         REVIEW OF SYSTEMS:   All systems have been reviewed and found to be negative except for as stated above in the history of present illness.      List of Current Health Care Providers   Care Team       PCP       Name Type Specialty Phone Number    [place], [place], [place] Physician INTERNAL MEDICINE [phone]              Care Team       Name Type Specialty Phone Number    Landry Corporal, MD Physician CARDIOVASCULAR DISEASE [phone]                      Health Maintenance   Topic Date Due    Shingles Vaccine (2 of 2) 04/12/2022    Diabetic Kidney Health Microalb/Cr Ratio  05/08/2022    Osteoporosis screening  08/24/2022    Diabetic Retinal Exam  11/28/2022    Depression Screening  06/16/2023    Diabetic Kidney Health eGFR  06/16/2023    Medicare Annual Wellness Visit  06/16/2023    Adult Tdap-Td (2 - Td or Tdap) 05/15/2032    Influenza Vaccine  Completed    Covid-19 Vaccine  Completed    Pneumococcal Vaccination, Age 24+  Completed    Meningococcal Vaccine  Aged Out    Diabetic A1C  Discontinued     Medicare Wellness Assessment   Medicare initial or wellness physical in the last year?: Yes  Advance Directives (optional)   Does patient have a living will or MPOA: YES   Has patient provided [employer] with a copy?: YES   Advance directive information given to the patient today?: YES      Activities of Daily Living   Do you need help with dressing, bathing, or walking?: Yes (help with bathing)   Do you need help with shopping, housekeeping, medications, or finances?: Yes   Do you have rugs in hallways, broken steps, or poor lighting?: Yes   Do you have grab bars in your bathroom, non-slip strips in your tub, and hand rails on your stairs?: Yes   Urinary Incontinence Screen (Women >=65 only)   Do you ever leak urine when you don't want to?:  YES   Cognitive Function Screen (1=Yes, 0=No)   What is you age?: Correct   What is the time to the nearest hour?: Correct   What is the year?: Correct   What is the name of this clinic?: Correct   Can the patient recognize two persons (the doctor, the nurse, home help, etc.)?: Correct   What is the date of your birth? (day and month sufficient) : Correct   In what year did World War II end?: Correct   Who is the current president of the [place]?: Correct   Count from 20 down to 1?: Correct   What address did I give you earlier?: Correct   Total Score: 10   Interpretation of Total Score: Greater than 6 Normal   Hearing Screen   Have you noticed any hearing difficulties?: Yes  After whispering 9-1-6 how many numbers did the patient repeat correctly?: 1  After whispering 4-7-8 how many numbers did the patient repeat correctly?: 1   Fall Risk Screen   Do you feel unsteady when standing or walking?: Yes  Do you worry about falling?: Yes  Have you fallen in the past year?: No (last fall approx a year ago)  How many times have you fallen?: Once  Were you ever injured from falling?: Yes  Timed up and go test (in seconds): 5   Vision Screen   Right Eye = 20:  (blind in the right)   Left Eye = 20: 0.67 (with glasses)   Depression Screen     Little interest or pleasure in doing things.: Not at all  Feeling down, depressed, or hopeless: Not at all  PHQ 2 Total: 0            03/10/2021     9:00 AM 06/15/2022    11:35 AM   Comprehensive Health Assessment-Adult   Do you wish to complete this form? Yes Yes   During the past 4 weeks, how would you rate your health in general? Fair Fair   During the past 4 weeks, how much difficulty have you had doing  your usual activities inside and outside your home because of medical or emotional problems? A little bit of difficulty Some difficulty   During the past 4 weeks, was someone available to help you if you needed and wanted help? Yes, as much as I wanted Yes, as much as I wanted   In  the past year, how many times have you gone to the emergency department or been admitted to a hospital for a health problem? 1 time 2-4 times   Are you generally satisfied with your sleep? Yes No   Do you have enough money to buy things you need in everyday life, such as food, clothing, medicines, and housing? Yes, always Yes, always   Can you get to places beyond walking distance without help?  (For example, can you drive your own car or travel alone on buses)? No No   Do you fasten your seatbelt when you are in a car? Yes, usually Yes, usually   Do you exercise 20 minutes 3 or more days per week (such as walking, dancing, biking, mowing grass, swimming)? Yes, some of the time No, I usually don't exercise this much   How often do you eat food that is healthy (fruits, vegetables, lean meats) instead of unhealthy (sweets, fast food, junk food, fatty foods)? Almost always A little bit of the time   Have your parents, brothers or sisters had any of the following problems before the age of 53? (check all that apply)  Other family illness   How often do you have trouble taking medicines the eay you are told to take them? I always take them as prescribed I always take them as prescribed   Do you need any help communicating with your doctors and nurses because of vision or hearing problems? No No   During the past 12 months, have you experienced confusion or memory loss that is happening more often or is getting worse? No Yes   Do you have one person you think of as your personal doctor (primary care provider or family doctor)?  Yes   If you are seeing a Primary Care Provider (PCP) or family doctor. please list their name  Dr. Earnest Bailey   Are you now also seeing any specialist physician(s) (such as eye doctor, foot doctor, skin doctor)? Yes Yes   If you are seeing a specialist for anything such as foot, eye, skin, etc.  please list their name(s) Dr. Marlow Baars for heart cardiology, opthomaolgy, urologu   How confident are you that  you can control or manage most of your health problems? Very confident Not very confident            Social Determinants of Health     Financial Resource Strain: Low Risk  (06/15/2022)    Financial Resource Strain     SDOH Financial: No   Transportation Needs: Low Risk  (06/15/2022)    Transportation Needs     SDOH Transportation: No   Social Connections: Low Risk  (06/15/2022)    Social Connections     SDOH Social Isolation: 5 or more times a week   Intimate Partner Violence: Low Risk  (06/15/2022)    Intimate Partner Violence     SDOH Domestic Violence: No   Housing Stability: Low Risk  (06/15/2022)    Housing Stability     SDOH Housing Situation: I have housing.     SDOH Housing Worry: Not on file   Health Literacy: Medium Risk (06/15/2022)    Health  Literacy     SDOH Health Literacy: Occasionally   Employment Status: Low Risk  (06/15/2022)    Employment Status     SDOH Employment: Otherwise unemployed but not seeking work (ex. Ship broker, retired, disabled, unpaid primary care giver)        OBJECTIVE:   BP 138/80   Pulse 75   Temp 36.3 C (97.4 F)   Ht 1.6 m (_0 )   Wt 47.7 kg (105 lb 3.2 oz)   SpO2 95%   BMI 18.64 kg/m      General:  No apparent distress.  Family at bedside.  Interactive.  Frail and chronically ill appearing.  HEENT:  Atraumatic, normocephalic.  EOMI. Sclera and conjunctiva are clear. Nares patent without lesion or discharge. Mucous membranes moist. Tongue protrudes midline. No tonsillar erythema or exudates. Glasses in place.   Neck:  Supple. Trachea is midline. No JVD. No adenopathy. No thyromegaly. No carotid bruits.  Respiratory: No respiratory distress. Lungs clear to auscultation bilaterally. No wheezes, rales, or rhonchi.  Cardiovascular: Regular rate and rhythm. S1, S2 present. No murmurs, rubs, or gallops. Pulses are +2 and equal in all extremities.  Abdomen:  Soft and non-distended.  Non-tender. Bowel sounds present and normoactive in all quadrants. No hepatosplenomegaly. No guarding  or rebound tenderness. No CVA tenderness.  Musculoskeletal: Moves all extremities equally. No joint swelling, erythema, or tendernes.  Neurologic: Alert.  Cranial nerves II-XIII grossly intact. Deep tendon reflexes +2 and symmetric in bilateral upper and lower extremities. Cerebellar function intact. Sensation intact.  Skin: Pink and warm. No rashes or petechiae noted.  Extremities:  No clubbing, cyanosis or edema.   Psychiatric:  Mood and affect appropriate. Memory intact with good short- and long-term recall. Judgement and insight appear appropriate. No suicidal or homicidal ideation.    Health Maintenance Due   Topic Date Due    Shingles Vaccine (2 of 2) 04/12/2022    Diabetic Kidney Health Microalb/Cr Ratio  05/08/2022        ASSESSMENT & PLAN:   Assessment/Plan   1. Encounter for annual wellness visit (AWV) in Medicare patient    2. Primary hypertension    3. Anxiety    4. Recurrent UTI (urinary tract infection)       Identified Risk Factors/ Recommended Actions         Urinary Incontinence Plan of Care: Referral to urology/urogynecology  Orders Placed This Encounter    URINALYSIS, MACROSCOPIC AND MICROSCOPIC W/CULTURE REFLEX    calcitrioL (ROCALTROL) 0.5 mcg Oral Capsule    lipase-protease-amylase 5,000-17,000- 24,000 unit Oral Capsule, Delayed Release(E.C.)        (Z00.00) Encounter for annual wellness visit (AWV) in Medicare patient  (primary encounter diagnosis)  Plan: Encounter completed. She is at risk for falls. She uses a rollator to help with walking and has completed physical therapy. She has issues with urinary incontinence and has an appointment with urology this month.     (I10) Primary hypertension  Plan: Fairly well controlled on losartan 50 mg oral twice daily, Toprol XL 100 mg oral daily, and amlodipine 7m oral daily. We are trying to balance between extreme hypertension and hypotension to prevent falls.     (F41.9) Anxiety  Plan: Better this week. Continue on Zoloft 12.563moral daily.      (N39.0) Recurrent UTI  Plan:  Recheck urinalysis today. I think this is likely interstitial cystitis and she will need placed on prophylactic antibiotics, but I will wait for urology recommendations on this. She  has an appointment coming up in a few weeks. Encouraged her to increase her water intake, as cutting back could worsen her symptoms.      The patient has been educated about risk factors and recommended preventive care. Written Prevention Plan completed, updated, and given to patient.     Return in about 3 months (around 09/15/2022) for HTN, CKD III.    Isaiahs Chancy Hill-Reinert, DO      This note was partially generated using MModal Fluency Direct system, and there may be some incorrect words, spellings, and punctuation that were not noted in checking the note before saving.

## 2022-06-15 NOTE — Nursing Note (Signed)
06/15/22 1148   Housing Stability   What is your housing situation? I have [place] and [place]   How often do you have a problem understanding what is told to you about your medical condition?  Occasionally   Employment   What is your current work situation? Otherwise un   Financial   In the past year, have you or any family members you live with been unable to get any of the following when it was really needed?  No   Transportation   Has lack of transportation kept you from medical appointments, meetings, work, or from getting things needed for daily living?  No   Social Connections   How often do you see or talk to people that you care about and feel close to? (For example: talking to friends on the phone, visiting friends or family, going to church or club meetings) 5 or more ti   Intimate Partner Violence   In the past year, have you been afraid of your partner or ex-partner? No   Do you feel physically safe and emotionally safe where you currently live? Yes   Help Needed   Would you like help with any of these needs? No   Are any of these needs urgent? No

## 2022-06-15 NOTE — Nursing Note (Signed)
06/15/22 1139   Medicare Wellness Assessment   Medicare initial or wellness physical in the last year? Yes   Advance Directives   Does patient have a living will or MPOA YES   Has patient provided [employer] with a copy? YES   Advance directive information given to the patient today? YES   Activities of Daily Living   Do you need help with dressing, bathing, or walking? Yes  (help with bathing)   Do you need help with shopping, housekeeping, medications, or finances? Yes   Do you have rugs in hallways, broken steps, or poor lighting? Yes   Do you have grab bars in your bathroom, non-slip strips in your tub, and hand rails on your stairs? Yes   Urinary Incontinence Screen   Do you ever leak urine when you don't want to? YES   Cognitive Function Screen   What is you age? 1   What is the time to the nearest hour? 1   What is the year? 1   What is the name of this clinic? 1   Can the patient recognize two persons (the doctor, the nurse, home help, etc.)? 1   What is the date of your birth? (day and month sufficient)  1   In what year did World War II end? 1   Who is the current president of the [place]? 1   Count from 20 down to 1? 1   What address did I give you earlier? 1   Total Score 10   Interpretation of Total Score Greater than 6 Normal   Depression Screen   Little interest or pleasure in doing things. 0   Feeling down, depressed, or hopeless 1   PHQ 2 Total 1   Pain Score   Pain Score Zero   Substance Use Screening   In Past 12 MONTHS, how often have you used any tobacco product (for example, cigarettes, e-cigarettes, cigars, pipes, or smokeless tobacco)? Never   In the PAST 12 MONTHS, how often have you had 5 (men)/4 (women) or more drinks containing alcohol in one day? Never   In the PAST 12 months, how often have you used any prescription medications just for the feeling, more than prescribed, or that were not prescribed for you? Prescriptions may include: opioids, benzodiazepines, medications  for ADHD Never   In the PAST 12 MONTHS, how often have you used any drugs, including marijuana, cocaine or crack, heroin, methamphetamine, hallucinogens, ecstasy/MDMA? Never   Hearing Screen   Have you noticed any hearing difficulties? Yes   After whispering 9-1-6 how many numbers did the patient repeat correctly? 1   After whispering 4-7-8 how many numbers did the patient repeat correctly? 1   Total Correct 2   Fall Risk Assessment   Do you feel unsteady when standing or walking? Yes   Do you worry about falling? Yes   Have you fallen in the past year? No  (last fall approx a year ago)   How many times have you fallen? Once   Were you ever injured from falling? 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

## 2022-06-15 NOTE — Nursing Note (Signed)
06/15/22 [place]   Do you wish to complete this form? Yes   During the past 4 weeks, how would you rate your health in general? Fair   During the past 4 weeks, how much difficulty have you had doing your usual activities inside and outside your home because of medical or emotional problems? Some difficulty   During the past 4 weeks, was someone available to help you if you needed and wanted help? Yes, as much as I wanted   In the past year, how many times have you gone to the emergency department or been admitted to a hospital for a health problem? 2-4 times   Are you generally satisfied with your sleep? No   Do you have enough money to buy things you need in everyday life, such as food, clothing, medicines, and housing? Yes, always   Can you get to places beyond walking distance without help?  (For example, can you drive your own car or travel alone on buses)? No   Do you fasten your seatbelt when you are in a car? Yes, usually   Do you exercise 20 minutes 3 or more days per week (such as walking, dancing, biking, mowing grass, swimming)? No, I usually don't exercise this much   How often do you eat food that is healthy (fruits, vegetables, lean meats) instead of unhealthy (sweets, fast food, junk food, fatty foods)? A little bit of the time   Have your parents, brothers or sisters had any of the following problems before the age of 59? (check all that apply) Other family illness   How often do you have trouble taking medicines the eay you are told to take them? I always take them as prescribed   Do you need any help communicating with your doctors and nurses because of vision or hearing problems? No   During the past 12 months, have you experienced confusion or memory loss that is happening more often or is getting worse? Yes   Do you have one person you think of as your personal doctor (primary care provider or family doctor)? Yes   If you are seeing a Primary Care Provider  (PCP) or family doctor. please list their name Dr. Earnest Bailey   Are you now also seeing any specialist physician(s) (such as eye doctor, foot doctor, skin doctor)? Yes   If you are seeing a specialist for anything such as foot, eye, skin, etc.  please list their name(s) cardiology, opthomaolgy, urologu   How confident are you that you can control or manage most of your health problems? Not very confident

## 2022-06-19 ENCOUNTER — Other Ambulatory Visit (INDEPENDENT_AMBULATORY_CARE_PROVIDER_SITE_OTHER): Payer: Self-pay | Admitting: FAMILY MEDICINE

## 2022-06-19 DIAGNOSIS — M069 Rheumatoid arthritis, unspecified: Secondary | ICD-10-CM

## 2022-06-19 DIAGNOSIS — G629 Polyneuropathy, unspecified: Secondary | ICD-10-CM

## 2022-06-19 DIAGNOSIS — M14672 Charcot's joint, left ankle and foot: Secondary | ICD-10-CM

## 2022-06-19 NOTE — Telephone Encounter (Signed)
Medication requires a six-month visit either video or in-person.

## 2022-06-20 ENCOUNTER — Encounter (INDEPENDENT_AMBULATORY_CARE_PROVIDER_SITE_OTHER): Payer: Self-pay | Admitting: Internal Medicine

## 2022-06-20 ENCOUNTER — Telehealth (INDEPENDENT_AMBULATORY_CARE_PROVIDER_SITE_OTHER): Payer: Self-pay | Admitting: Internal Medicine

## 2022-06-20 ENCOUNTER — Other Ambulatory Visit (INDEPENDENT_AMBULATORY_CARE_PROVIDER_SITE_OTHER): Payer: Self-pay | Admitting: Internal Medicine

## 2022-06-20 DIAGNOSIS — G629 Polyneuropathy, unspecified: Secondary | ICD-10-CM

## 2022-06-20 DIAGNOSIS — M14672 Charcot's joint, left ankle and foot: Secondary | ICD-10-CM

## 2022-06-20 DIAGNOSIS — M069 Rheumatoid arthritis, unspecified: Secondary | ICD-10-CM

## 2022-06-20 MED ORDER — LIPASE-PROTEASE-AMYLASE 5,000-17,000-24,000 UNIT CAPSULE, DELAYED REL
1.0000 | DELAYED_RELEASE_CAPSULE | Freq: Three times a day (TID) | ORAL | 3 refills | Status: AC
Start: 2022-06-20 — End: 2023-06-15

## 2022-06-20 MED ORDER — GABAPENTIN 100 MG CAPSULE
100.0000 mg | ORAL_CAPSULE | Freq: Two times a day (BID) | ORAL | 0 refills | Status: DC
Start: 2022-06-20 — End: 2023-01-31

## 2022-06-20 MED ORDER — METHOTREXATE SODIUM 2.5 MG TABLET
2.5000 mg | ORAL_TABLET | ORAL | 0 refills | Status: DC
Start: 2022-06-20 — End: 2022-07-02

## 2022-06-20 MED ORDER — FOLIC ACID 1 MG TABLET
1.0000 mg | ORAL_TABLET | Freq: Every day | ORAL | 0 refills | Status: DC
Start: 2022-06-20 — End: 2022-09-18

## 2022-06-20 MED ORDER — SERTRALINE 25 MG TABLET
12.5000 mg | ORAL_TABLET | Freq: Every day | ORAL | 0 refills | Status: DC
Start: 2022-06-20 — End: 2022-09-18

## 2022-06-20 NOTE — Telephone Encounter (Signed)
-----   Message from [facility], DO sent at 06/19/2022  6:39 AM EST -----  Let Theresa Greene or her daughter know that there was no UTI on the urine she left. It is probably from the dehydration that she is having the burning and dark urine. Labs are all stable except for the parathyroid hormone and it looks like she was not taking the calcitriol. Her daughter was going to check on it.

## 2022-06-20 NOTE — Telephone Encounter (Signed)
Pt notified of results

## 2022-06-20 NOTE — Addendum Note (Signed)
Addended by: Bosie Clos on: 06/20/2022 02:10 PM     Modules accepted: Orders

## 2022-06-28 ENCOUNTER — Telehealth (INDEPENDENT_AMBULATORY_CARE_PROVIDER_SITE_OTHER): Payer: Self-pay | Admitting: Internal Medicine

## 2022-06-28 NOTE — Telephone Encounter (Signed)
Has a question about her mother's medication Methotrexate.  Also did the rx Sertraline refill get sent in?     Can reach [name] @  [phone]

## 2022-07-02 ENCOUNTER — Encounter (INDEPENDENT_AMBULATORY_CARE_PROVIDER_SITE_OTHER): Payer: Self-pay | Admitting: Internal Medicine

## 2022-07-02 MED ORDER — METHOTREXATE SODIUM 2.5 MG TABLET
10.0000 mg | ORAL_TABLET | ORAL | 0 refills | Status: DC
Start: 2022-07-02 — End: 2022-09-18

## 2022-07-03 ENCOUNTER — Other Ambulatory Visit: Payer: Self-pay

## 2022-07-03 ENCOUNTER — Emergency Department
Admission: EM | Admit: 2022-07-03 | Discharge: 2022-07-03 | Disposition: A | Discharge status: Home / Self Care | Payer: Medicare Other | Attending: Emergency Medicine | Admitting: Emergency Medicine

## 2022-07-03 ENCOUNTER — Emergency Department (EMERGENCY_DEPARTMENT_HOSPITAL): Payer: Medicare Other

## 2022-07-03 ENCOUNTER — Encounter (INDEPENDENT_AMBULATORY_CARE_PROVIDER_SITE_OTHER): Payer: Medicare Other | Admitting: CARDIOVASCULAR DISEASE

## 2022-07-03 ENCOUNTER — Encounter (HOSPITAL_COMMUNITY): Payer: Self-pay

## 2022-07-03 DIAGNOSIS — S8002XA Contusion of left knee, initial encounter: Secondary | ICD-10-CM | POA: Insufficient documentation

## 2022-07-03 DIAGNOSIS — I10 Essential (primary) hypertension: Secondary | ICD-10-CM | POA: Insufficient documentation

## 2022-07-03 DIAGNOSIS — S8992XA Unspecified injury of left lower leg, initial encounter: Secondary | ICD-10-CM

## 2022-07-03 DIAGNOSIS — Z96651 Presence of right artificial knee joint: Secondary | ICD-10-CM

## 2022-07-03 DIAGNOSIS — S0101XA Laceration without foreign body of scalp, initial encounter: Secondary | ICD-10-CM

## 2022-07-03 DIAGNOSIS — M50323 Other cervical disc degeneration at C6-C7 level: Secondary | ICD-10-CM

## 2022-07-03 DIAGNOSIS — M4807 Spinal stenosis, lumbosacral region: Secondary | ICD-10-CM

## 2022-07-03 DIAGNOSIS — M47812 Spondylosis without myelopathy or radiculopathy, cervical region: Secondary | ICD-10-CM

## 2022-07-03 DIAGNOSIS — M19012 Primary osteoarthritis, left shoulder: Secondary | ICD-10-CM

## 2022-07-03 DIAGNOSIS — M2578 Osteophyte, vertebrae: Secondary | ICD-10-CM

## 2022-07-03 DIAGNOSIS — I70208 Unspecified atherosclerosis of native arteries of extremities, other extremity: Secondary | ICD-10-CM

## 2022-07-03 DIAGNOSIS — S8001XA Contusion of right knee, initial encounter: Secondary | ICD-10-CM | POA: Insufficient documentation

## 2022-07-03 DIAGNOSIS — W1839XA Other fall on same level, initial encounter: Secondary | ICD-10-CM

## 2022-07-03 DIAGNOSIS — S4992XA Unspecified injury of left shoulder and upper arm, initial encounter: Secondary | ICD-10-CM

## 2022-07-03 DIAGNOSIS — M4802 Spinal stenosis, cervical region: Secondary | ICD-10-CM

## 2022-07-03 DIAGNOSIS — W0110XA Fall on same level from slipping, tripping and stumbling with subsequent striking against unspecified object, initial encounter: Secondary | ICD-10-CM | POA: Insufficient documentation

## 2022-07-03 DIAGNOSIS — S8991XA Unspecified injury of right lower leg, initial encounter: Secondary | ICD-10-CM

## 2022-07-03 DIAGNOSIS — Z96652 Presence of left artificial knee joint: Secondary | ICD-10-CM

## 2022-07-03 DIAGNOSIS — M4312 Spondylolisthesis, cervical region: Secondary | ICD-10-CM

## 2022-07-03 DIAGNOSIS — Z7902 Long term (current) use of antithrombotics/antiplatelets: Secondary | ICD-10-CM | POA: Insufficient documentation

## 2022-07-03 DIAGNOSIS — Z79899 Other long term (current) drug therapy: Secondary | ICD-10-CM | POA: Insufficient documentation

## 2022-07-03 DIAGNOSIS — R937 Abnormal findings on diagnostic imaging of other parts of musculoskeletal system: Secondary | ICD-10-CM

## 2022-07-03 DIAGNOSIS — S0990XA Unspecified injury of head, initial encounter: Secondary | ICD-10-CM

## 2022-07-03 DIAGNOSIS — S0003XA Contusion of scalp, initial encounter: Secondary | ICD-10-CM

## 2022-07-03 DIAGNOSIS — M50322 Other cervical disc degeneration at C5-C6 level: Secondary | ICD-10-CM

## 2022-07-03 DIAGNOSIS — M545 Low back pain, unspecified: Secondary | ICD-10-CM | POA: Insufficient documentation

## 2022-07-03 DIAGNOSIS — S22080A Wedge compression fracture of T11-T12 vertebra, initial encounter for closed fracture: Secondary | ICD-10-CM

## 2022-07-03 DIAGNOSIS — M50321 Other cervical disc degeneration at C4-C5 level: Secondary | ICD-10-CM

## 2022-07-03 DIAGNOSIS — S1980XA Other specified injuries of unspecified part of neck, initial encounter: Secondary | ICD-10-CM

## 2022-07-03 MED ORDER — LIDOCAINE 1 %-EPINEPHRINE 1:100,000 INJECTION SOLUTION
15.0000 mL | INTRAMUSCULAR | Status: AC
Start: 2022-07-03 — End: 2022-07-03
  Administered 2022-07-03: 150 mg via INTRADERMAL
  Filled 2022-07-03: qty 20

## 2022-07-03 MED ORDER — ACETAMINOPHEN 325 MG TABLET
650.0000 mg | ORAL_TABLET | ORAL | Status: AC
Start: 2022-07-03 — End: 2022-07-03
  Administered 2022-07-03: 650 mg via ORAL
  Filled 2022-07-03: qty 2

## 2022-07-03 MED ORDER — LOSARTAN 50 MG TABLET
50.0000 mg | ORAL_TABLET | ORAL | Status: AC
Start: 2022-07-03 — End: 2022-07-03
  Administered 2022-07-03: 50 mg via ORAL
  Filled 2022-07-03: qty 1

## 2022-07-03 NOTE — ED Provider Notes (Signed)
Department of Emergency Medicine  [facility]  07/03/2022    HPI   Patient is a 87 y.o. female presenting to the ED via private vehicle with chief complaint of fall, head injury, left shoulder pain, low back pain and bilateral knee pain.  Patient states that she went to sit in her chair at home and missed the chair and fell down onto her knees and then to her left side.  She hit her head and has a laceration to the left parietal region.  Patient admits that she has not been feeling well for the last few days, she was recently put on Bactrim for urinary tract infection and she states she thinks it is making her nauseated and weak.  Patient does take Plavix.  She denies any neck or upper back pain. Denies any LOC, nausea, vomiting, blurry vision, headache.  Tetanus immunization was received a month ago.    Review of Systems   HENT:  Negative for ear discharge and nosebleeds.    Eyes:  Negative for double vision.   Respiratory:  Negative for shortness of breath.    Cardiovascular:  Negative for chest pain.   Gastrointestinal:  Negative for abdominal pain and nausea.   Musculoskeletal:  Positive for back pain, falls, joint pain and myalgias.   Skin:         Scalp laceration   Neurological:  Negative for dizziness, loss of consciousness and headaches.   All other systems reviewed and are negative.       All other systems reviewed and are negative, unless commented on in the HPI.     Past Medical History:   Diagnosis Date    Arthritis     Asthma     Back problem     CAD S/P percutaneous coronary angioplasty 2010    Pt reports 5 years ago    Charcot's joint of foot, left 08/06/2014    Colon polyp     CVA (cerebrovascular accident) (CMS HCC) 1995    no residual deficits    Deaf     Diverticulitis of colon     Gastroesophageal reflux disease 08/06/2014    GI bleed     H/O hearing loss     History of cardiac pacemaker 08/06/2014    HTN (hypertension)     Hyperlipidemia 12/25/2019    Muscle weakness     Neuropathy  (CMS HCC)     Pacemaker     Paroxysmal atrial fibrillation (CMS HCC) 08/06/2014    PUD (peptic ulcer disease) 08/06/2014    C/b GIB Spring 2015    Rheumatoid arthritis (CMS HCC) 08/06/2014    Stage 3 chronic kidney disease (CMS HCC) 12/25/2019    Last Assessment & Plan:  Renal function stable, at baseline Stable electrolytes; restart sodium bicarb 650 mg BID  Continue renal protective ARB Discussed CKD risk factor modifications, including tight BP/glucose control, low protein diet, and avoidance of NSAIDs    Stented coronary artery     Vitamin D deficiency 12/25/2019    Wears glasses      Past Surgical History:   Procedure Laterality Date    Ankle surgery Left     Cataract extraction      Coronary artery angioplasty  2011    Esophagogastroduodenoscopy      Hip surgery Right     Hx back surgery      Hx colonoscopy      Hx hip replacement Left     Hx nissen fundoplication  2002    Hx open cholecystectomy  1962    Hx pacemaker insertion  2019    Hx shoulder surgery Bilateral     Knee arthroplasty Bilateral     Orif distal femur fracture Right     Other surgical history Right     Pancreatic pseudocyst drainage  1971    Total abdominal hysterectomy       Allergies   Allergen Reactions    Iodinated Contrast Media Anaphylaxis    Iv Contrast Anaphylaxis     makese her serverly sick    Etodolac NO Steroids unless approved by Attending Physician    Symbicort [Budesonide-Formoterol]     Codeine  Other Adverse Reaction (Add comment) and Nausea/ Vomiting     Stomach pain  Stomach pain        Current Outpatient Medications   Medication Sig    acetaminophen (TYLENOL ARTHRITIS PAIN) 650 mg Oral Tablet Sustained Release Take 2 Tablets (1,300 mg total) by mouth Twice per day as needed for Pain    amiodarone (PACERONE) 100 mg Oral Tablet Take 1 Tablet (100 mg total) by mouth Every morning with breakfast    amLODIPine (NORVASC) 5 mg Oral Tablet Take 1 Tablet (5 mg total) by mouth Once a day    atorvastatin (LIPITOR) 20 mg Oral Tablet  Take 1 Tablet (20 mg total) by mouth Every evening    calcitrioL (ROCALTROL) 0.5 mcg Oral Capsule Take 1 Capsule (0.5 mcg total) by mouth Once a day for 360 days    celecoxib (CELEBREX) 200 mg Oral Capsule Take 1 Capsule (200 mg total) by mouth Once per day as needed for Pain    clopidogreL (PLAVIX) 75 mg Oral Tablet TAKE ONE TABLET BY MOUTH DAILY    Dexlansoprazole (DEXILANT) 60 mg Oral Cap, Delayed Rel., Multiphasic Take 1 Capsule (60 mg total) by mouth Once a day for 180 days    ergocalciferol, vitamin D2, (DRISDOL) 1,250 mcg (50,000 unit) Oral Capsule Take 1 Capsule (50,000 Units total) by mouth Every MON and FRI for 336 days Takes on Fridays    ezetimibe (ZETIA) 10 mg Oral Tablet TAKE ONE TABLET BY MOUTH DAILY    folic acid (FOLVITE) 1 mg Oral Tablet Take 1 Tablet (1 mg total) by mouth Once a day for 90 days    gabapentin (NEURONTIN) 100 mg Oral Capsule Take 1 Capsule (100 mg total) by mouth Twice daily    gemfibroziL (LOPID) 600 mg Oral Tablet Take 1 Tablet (600 mg total) by mouth Once a day for 360 days    lipase-protease-amylase 5,000-17,000- 24,000 unit Oral Capsule, Delayed Release(E.C.) Take 1 Capsule by mouth Three times daily with meals for 360 days    losartan (COZAAR) 50 mg Oral Tablet Take 1 Tablet (50 mg total) by mouth Twice daily for 90 days    LYSINE ORAL Take 100 mg by mouth Once per day as needed    melatonin 10 mg Oral Tablet Take 1 Tablet (10 mg total) by mouth Every night    methotrexate 2.5 mg Oral tablet Take 4 Tablets (10 mg total) by mouth Every 7 days for 90 days Takes on Thursdays    metoprolol succinate (TOPROL-XL) 100 mg Oral Tablet Sustained Release 24 hr Take 1 Tablet (100 mg total) by mouth Once a day    nitroGLYCERIN (NITROSTAT) 0.4 mg Sublingual Tablet, Sublingual Place 1 Tablet (0.4 mg total) under the tongue Every 5 minutes as needed for Chest pain (after three doses, call ER if pain  still persists) for 3 doses over 15 minutes    oxyBUTYnin chloride (DITROPAN XL) 10 mg Oral  Tablet Extended Rel 24 hr Take 1 Tablet (10 mg total) by mouth Once a day for 90 days (Patient not taking: Reported on 05/25/2022)    sertraline (ZOLOFT) 25 mg Oral Tablet Take 0.5 Tablets (12.5 mg total) by mouth Once a day for 90 days    sodium bicarbonate 650 mg Oral Tablet Take 1 Tablet (650 mg total) by mouth Twice daily      Above history reviewed with patient.  Previous records also reviewed.     Physical Exam:     Filed Vitals:    07/03/22 1527 07/03/22 1813 07/03/22 1947   BP: (!) 147/87 (!) 190/103 (!) 187/93   Pulse: 70 70    Resp: Temp: 36.7 C (98 F)     SpO2: 97% 98%        Physical Exam  Vitals and nursing note reviewed.   Constitutional:       General: She is not in acute distress.     Appearance: Normal appearance.   HENT:      Head:        Comments: Flap type laceration with hematoma to left parietal scalp.     Right Ear: Tympanic membrane and ear canal normal. No hemotympanum.      Left Ear: Tympanic membrane and ear canal normal. No hemotympanum.      Ears:      Comments: Slight amount of fluid behind bilateral TMs, no hemotympanum.     Nose: No rhinorrhea.      Mouth/Throat:      Mouth: Mucous membranes are moist.   Eyes:      Extraocular Movements: Extraocular movements intact.      Pupils: Pupils are equal, round, and reactive to light.   Cardiovascular:      Rate and Rhythm: Normal rate.      Heart sounds: Normal heart sounds.   Pulmonary:      Effort: Pulmonary effort is normal. No respiratory distress.      Breath sounds: No wheezing.   Abdominal:      General: There is no distension.      Tenderness: There is no abdominal tenderness.   Musculoskeletal:      Cervical back: Normal range of motion and neck supple. No rigidity or tenderness.      Comments: Patient does have some tenderness of bilateral knees with bruising noted just medial to the left patella.  Mild discomfort with range of motion.  There is also a superficial abrasion along the proximal left humerus, some pain  with range of motion the patient states that she has chronic pain/arthritis.  She has some midline tenderness of the low lumbar region.   Skin:     General: Skin is warm.   Neurological:      General: No focal deficit present.      Mental Status: She is alert and oriented to person, place, and time.      Sensory: No sensory deficit.   Psychiatric:         Mood and Affect: Mood normal.         Behavior: Behavior normal.        LAC REPAIR/WOUND CLOSURE    Date/Time: 07/03/2022 8:08 PM    Performed by: Almond Lint, PA-C  Authorized by: Sharlotte Alamo, DO    Consent:  Consent obtained:  Verbal    Consent given by:  Patient    Risks discussed:  Infection, pain and poor cosmetic result  Universal protocol:     Imaging studies available: yes    Anesthesia:     Anesthesia method:  Local infiltration    Local anesthetic:  Lidocaine 1% WITH epi  Laceration details:     Location:  Scalp    Scalp location:  L parietal    Length (cm):  4  Exploration:     Wound exploration: entire depth of wound visualized      Contaminated: no    Treatment:     Area cleansed with:  Saline    Irrigation solution:  Sterile saline    Debridement:  None  Skin repair:     Repair method:  Staples    Number of staples:  9  Repair type:     Repair type:  Simple  Post-procedure details:     Dressing:  Sterile dressing and bulky dressing    Procedure completion:  Tolerated well, no immediate complications       Orders:  Orders Placed This Encounter    BEDSIDE  WOUND CLOSURE / LAC REPAIR    CT BRAIN WO IV CONTRAST    CT CERVICAL SPINE WO IV CONTRAST    CT LUMBAR SPINE WO IV CONTRAST    CANCELED: XR KNEE BILATERAL 4 OR MORE VIEWS EA    XR SHOULDER LEFT    XR KNEE BILATERAL 3 VIEWS EA    CT SHOULDER LEFT WO IV CONTRAST    lidocaine 1%-EPINEPHrine 1:100,000 injection    acetaminophen (TYLENOL) tablet    losartan (COZAAR) tablet       Imaging:  Results for orders placed or performed during the hospital encounter of 07/03/22 (from the past 72 hour(s))    CT BRAIN WO IV CONTRAST     Status: None    Narrative    Classie RUTH Nall  Female, 87 years old.    CT BRAIN WO IV CONTRAST performed on 07/03/2022 5:01 PM.    REASON FOR EXAM:  fall, head injury    RADIATION DOSE: 1212.70 mGycm    TECHNIQUE: CT images of the head were acquired without intravenous contrast.    COMPARISON: CT head 05/23/2021    FINDINGS:   There is no acute intracranial hemorrhage or extra-axial fluid collection. Normal gray-white differentiation is maintained, without CT evidence of an acute territorial infarct. Periventricular and subcortical hypodensities are likely attributable to chronic microvascular disease. Mild diffuse cerebral volume loss is present. Calcifications are seen within the distal vertebral arteries and carotid siphons.    Ventricles and basal cisterns are normal in caliber. There is no mass effect or midline shift.    Intraorbital contents are unremarkable. The paranasal sinuses and mastoid air cells are well-aerated. The calvarium is intact. There is a left parietal scalp laceration and hematoma.      Impression    1. No acute intracranial hemorrhage or skull fracture.  The periventricular and subcortical white matter lucencies are seen which can indicate underlying chronic microvascular ischemic changes.    2. Left parietal scalp laceration and hematoma.            Radiologist location ID: [id number]     CT CERVICAL SPINE WO IV CONTRAST     Status: None    Narrative    Jericka RUTH Bodkins  Female, 87 years old.    CT CERVICAL SPINE WO IV CONTRAST performed  on 07/03/2022 5:01 PM.    REASON FOR EXAM:  fall, head injury    RADIATION DOSE: 1212.70 mGycm    TECHNIQUE: Axial images of the cervical spine, with coned down field of view and multiplanar reconstructions using bone and soft tissue algorithms    COMPARISON: All    FINDINGS: No evidence of acute cervical spine fracture or traumatic malalignment. Vertebral body heights are preserved. There is chronic appearing loss of  intervertebral disc space at C4-C5, C5-C6, and C6-C7 with anterior and posterior disc osteophyte complexes. There is grade 1 retrolisthesis of C5 in relation to C4 and C6. There is spinal canal narrowing at C5-C6 measuring 6 mm AP diameter. There is right neural foraminal stenosis at C5-C6 and C6-C7. Cervical spinal alignment is maintained. Prevertebral and paraspinal soft tissues are normal. Bone mineral density is preserved. There is multilevel facet arthropathy throughout the cervical spine.    The included lung apices show no acute process. The cervical fat planes are preserved. The thyroid is homogeneous.      Impression    No evidence of acute cervical spine fracture or traumatic malalignment.          Radiologist location ID: [id number]     CT LUMBAR SPINE WO IV CONTRAST     Status: None    Narrative    Nakayla RUTH Wix  Female, 87 years old.    CT LUMBAR SPINE WO IV CONTRAST performed on 07/03/2022 5:01 PM.    REASON FOR EXAM:  fall, low back pain    RADIATION DOSE: 405.00 mGycm    TECHNIQUE: Axial images of the lumbar spine, with coned down field of view and multiplanar reconstructions using bone and soft tissue algorithms    COMPARISON: None    FINDINGS: Bone mineral density is preserved.  No acute vertebral body height loss identified. There is chronic T12 inferior endplate compression deformity. There is multilevel degenerative change and scoliotic curvature within the lumbar spine. There is multilevel vertebral body height loss. No definite spinal canal narrowing. There is likely bilateral neural foraminal stenosis from L2 through S1..  The lumbar spinal alignment is preserved.   Pre-vertebral and para-spinal soft tissues are normal.    The included lung bases demonstrate no consolidation.  The included intra-abdominal organs show no acute process.      Impression    1. No acute fracture or malalignment.  2. Chronic T12 inferior endplate compression deformity.  3. No spinal canal narrowing.  Multilevel neural foraminal stenosis as detailed above.      Radiologist location ID: [id number]     XR SHOULDER LEFT     Status: None    Narrative    Arriyanna RUTH Laux  Female, 87 years old.    XR SHOULDER LEFT performed on 07/03/2022 5:02 PM.    REASON FOR EXAM:  fall onto shoulder    TECHNIQUE: 3 views/3 images submitted for interpretation.    COMPARISON:  Left shoulder radiographs 06/15/2021    FINDINGS:  There is diffuse osteopenia. There is subtle linear lucency within left humeral head. There is poor visualization of the acromion. Severe degenerative changes and postsurgical changes are present throughout the left shoulder. Soft tissues are unremarkable.      Impression    Subtle linear lucency within the left femoral head.. In the setting of trauma cannot exclude nondisplaced fracture. Recommend left shoulder CT for further evaluation.      Radiologist location ID: [id number]     XR KNEE BILATERAL 3 VIEWS  EA     Status: None    Narrative    Zayanna RUTH Glandon  Female, 87 years old.    XR KNEE BILATERAL 3 VIEWS EA performed on 07/03/2022 5:06 PM.    REASON FOR EXAM:  fall into knees    TECHNIQUE: 6 views/6 images submitted for interpretation.    COMPARISON:  Right knee radiographs    FINDINGS:  Patient is status post bilateral total knee arthroplasties. There is internal fixation related to prior right femoral fracture. There is no acute fracture or malalignment within the bilateral knees. No joint effusion. No evidence of hardware complication. Vascular calcifications are noted.      Impression    No fracture, dislocation, or acute osseous abnormality identified on bilateral knee radiographs.      Radiologist location ID: [id number]     CT SHOULDER LEFT WO IV CONTRAST     Status: None    Narrative    Jesyca RUTH Debroux  Female, 87 years old.    CT SHOULDER LEFT WO IV CONTRAST performed on 07/03/2022 6:59 PM.    REASON FOR EXAM:  fall, L shoulder pain with questionable lucency through humeral head on  x-ray    RADIATION DOSE: 222.90 mGycm    TECHNIQUE: helical images of the left shoulder intravenous contrast. Images reconstructed in the sagittal and coronal planes.    FINDINGS:  Imaging through the left shoulder demonstrates no evidence of fracture, dislocation, or acute osseous abnormality. There is moderate to severe degenerative change throughout the shoulder. There is superior subluxation of the humeral head in relation to the glenoid abutting the inferior surface of the acromion. Marginal osteophytes are noted Orthopedic anchor overlies the humeral head. No obvious soft tissue abnormality or effusion is detected.      Impression    Moderate to severe degenerative changes without fracture or other significant acute abnormality identified on left shoulder CT.          Radiologist location ID: [id number]       ED Course as of 07/20/22 0734   Tue Jul 03, 2022   1729 Patient seen in conjunction with Bertram Denver, PA-C after she fell and sustained injuries including a scalp laceration.  Will proceed with wound closure while awaiting remainder of results.  Tetanus is currently up-to-date.      I saw and examined the patient.  Agree with resident/APP documentation as below.  Please see my primary attending note for clarifications/corrections/additions.    Therapy/Procedures/Course/MDM:   Patient vitally stable throughout stay in the ED. Patient presenting to the ED with her husband after a fall at home.  She has left parietal scalp laceration/hematoma and is complaining of left lateral shoulder pain, bilateral knee pain and low back pain.  Scan of head, neck, low back negative for any acute findings.  No acute findings knee x-rays.  There was questionable lucency on left shoulder x-ray, CT was ordered and no acute findings were noted.  Patient's blood pressure was elevated and she stated she did not take her evening BP meds, patient was given losartan.  Patient was given Tylenol, laceration was cleansed and repaired  with hemostasis achieved.  A dressing was placed and the patient was instructed not to remove it until tomorrow.  Tylenol for pain.  Recommended close follow-up with primary care provider for re-evaluation, staples out in 7 days.  Discussed return precautions such as headache, dizziness, nausea/vomiting, changes in mental or neurologic status.  Medical Decision Making  Problems Addressed:  Acute midline low back pain without sciatica: acute illness or injury  Contusion of left knee, initial encounter: acute illness or injury  Contusion of right knee, initial encounter: acute illness or injury  Injury of head, initial encounter: acute illness or injury  Injury of left shoulder, initial encounter: acute illness or injury  Laceration of scalp, initial encounter: acute illness or injury    Amount and/or Complexity of Data Reviewed  Radiology: ordered. Decision-making details documented in ED Course.    Risk  OTC drugs.  Prescription drug management.       Impression:    Clinical Impression   Injury of head, initial encounter (Primary)   Laceration of scalp, initial encounter   Injury of left shoulder, initial encounter   Contusion of left knee, initial encounter   Contusion of right knee, initial encounter   Acute midline low back pain without sciatica     Disposition:    Discharged    Following the above history, physical exam, and studies, the patient was deemed stable and suitable for discharge.   It was advised that the patient return to the ED with any new, concerning or worsening symptoms and follow up as directed.   Return precautions reviewed.   The patient verbalized understanding of all instructions, had no further questions or concerns, and agreed to plan.      Follow Up:    Hill-Reinert, [place], DO  [address]  [place] [address]  [phone]    In 2 days  ER follow up, staples out in 7 days    Future Appointments   Date Time Provider Department Center   08/23/2022  1:45 AM Ccm, Home Device Clinic [facility]  [place]   09/18/2022  9:00 AM Hill-Reinert, Dorice [facility]   10/08/2022 10:30 AM Clemon Chambers, MD [facility]   11/22/2022  1:45 AM Ccm, Home Device Clinic [facility] [place]   02/21/2023  1:45 AM Ccm, Home Device Clinic [facility] [place]   05/29/2023 12:30 PM Clinic-Ccm, Device [facility] [place]        Prescriptions:      Current Discharge Medication List        CONTINUE these medications - NO CHANGES were made during your visit.        Details   acetaminophen 650 mg Tablet Sustained Release  Commonly known as: TYLENOL ARTHRITIS PAIN   1,300 mg, Oral, 2 TIMES DAILY PRN  Refills: 0     amiodarone 100 mg Tablet  Commonly known as: PACERONE   100 mg, Oral, EVERY MORNING WITH BREAKFAST  Refills: 0     amLODIPine 5 mg Tablet  Commonly known as: NORVASC   5 mg, Oral, DAILY  Refills: 0     atorvastatin 20 mg Tablet  Commonly known as: LIPITOR   20 mg, Oral, EVERY EVENING  Refills: 0     calcitrioL 0.5 mcg Capsule  Commonly known as: ROCALTROL   0.5 mcg, Oral, DAILY  Qty: 90 Capsule  Refills: 3     celecoxib 200 mg Capsule  Commonly known as: CeleBREX   200 mg, Oral, DAILY PRN  Refills: 0     clopidogreL 75 mg Tablet  Commonly known as: PLAVIX   TAKE ONE TABLET BY MOUTH DAILY  Qty: 90 Tablet  Refills: 3     Dexlansoprazole 60 mg Cap, Delayed Rel., Multiphasic  Commonly known as: DEXILANT   60 mg, Oral, DAILY  Qty: 90 Capsule  Refills: 1     ergocalciferol (vitamin D2) 1,250 mcg (50,000 unit) Capsule  Commonly known as: DRISDOL   50,000 Units, Oral, EVERY MO AND FR, Takes on Fridays  Qty: 96 Capsule  Refills: 0     ezetimibe 10 mg Tablet  Commonly known as: ZETIA   TAKE ONE TABLET BY MOUTH DAILY  Qty: 90 Tablet  Refills: 3     folic acid 1 mg Tablet  Commonly known as: FOLVITE   1 mg, Oral, DAILY  Qty: 90 Tablet  Refills: 0     gabapentin 100 mg Capsule  Commonly known as: NEURONTIN   100 mg, Oral, 2 TIMES DAILY  Qty: 180 Capsule  Refills: 0     gemfibroziL 600 mg Tablet  Commonly known as: LOPID   600 mg, Oral,  DAILY  Qty: 90 Tablet  Refills: 3     lipase-protease-amylase 5,000-17,000- 24,000 unit Capsule, Delayed Release(E.C.)   1 Capsule, Oral, 3 TIMES DAILY WITH MEALS  Qty: 270 Capsule  Refills: 3     losartan 50 mg Tablet  Commonly known as: COZAAR   50 mg, Oral, 2 TIMES DAILY  Qty: 180 Tablet  Refills: 1     LYSINE ORAL   100 mg, Oral, DAILY PRN  Refills: 0     melatonin 10 mg Tablet   10 mg, Oral, NIGHTLY  Refills: 0     methotrexate 2.5 mg tablet   10 mg, Oral, EVERY 7 DAYS, Takes on Thursdays  Qty: 48 Tablet  Refills: 0     metoprolol succinate 100 mg Tablet Sustained Release 24 hr  Commonly known as: TOPROL-XL   100 mg, Oral, DAILY  Qty: 90 Tablet  Refills: 4     nitroGLYCERIN 0.4 mg Tablet, Sublingual  Commonly known as: NITROSTAT   0.4 mg, Sublingual, EVERY 5 MIN PRN, for 3 doses over 15 minutes  Qty: 25 Tablet  Refills: 5     oxyBUTYnin chloride 10 mg Tablet Extended Rel 24 hr  Commonly known as: DITROPAN XL   10 mg, Oral, DAILY  Qty: 90 Tablet  Refills: 0     sertraline 25 mg Tablet  Commonly known as: ZOLOFT   12.5 mg, Oral, DAILY  Qty: 45 Tablet  Refills: 0     sodium bicarbonate 650 mg Tablet   650 mg, Oral, 2 TIMES DAILY  Qty: 180 Tablet  Refills: 3              Attestation:     The co-signing Dr was present in the ED and available for consultation and did participate in the care of the patient.     This note was partly generated using a voice recognition software; please excuse any grammatical errors.       Almond Lint, PA-C  07/03/2022, 15:37

## 2022-07-03 NOTE — ED Triage Notes (Signed)
Patient stated a few minutes ago was at home was trying to sit down in chair and missed it and hit left side of head unsure what hit head on was put on a medication unsure what its called thinks it was bactrim and hasn't felt good since was put on it for UTI    C/o bilateral knee pain , left side of back, and left side of head    Is taking blood thinner

## 2022-07-03 NOTE — ED Nurses Note (Signed)
Wounds cleaned at this time, pt tolerated well.

## 2022-07-03 NOTE — Discharge Instructions (Addendum)
Recommend over-the-counter Tylenol for pain.  Leave head dressing on until tomorrow.  You may continue to wear a dressing if the laceration is still bleeding.  Gentle range-of-motion to avoid stiffness.  Recommend close follow-up with your primary care provider for re-evaluation, return to the ER with any worsening in symptoms including nausea, vomiting, severe headache, changes in mental or neurologic status.  Staples out by PCP in 7 days.

## 2022-07-04 ENCOUNTER — Telehealth (HOSPITAL_COMMUNITY): Payer: Self-pay

## 2022-07-04 NOTE — Progress Notes (Signed)
Post Ed Follow-Up    Post ED Follow-Up:   Document completed and/or attempted interactive contact(s) after transition to home after emergency department stay.:   Transition Facility and relevant Date:   Discharge Date: 07/03/22  Discharge from [facility] Emergency Department?: Yes  Discharge Facility: [facility]  Contacted by: London Pepper RN  Contact method: Patient/Caregiver Telephone  Contact completed: 07/04/2022 10:33 AM  Was the AVS reviewed with patient?: Yes  How is the patient recovering?: Improving  Medications prescribed: No  Interventions: Provided patient education  Pt states she is doing well today. She states the ER staff told her she could   return there in 7 days to have the staples removed and she is planning on doing so. If you have any questions please feel free to call [phone] to speak to a Nurse Navigator. This service is available 24/7.         London Pepper, RN  07/04/2022 10:35

## 2022-07-09 ENCOUNTER — Telehealth (INDEPENDENT_AMBULATORY_CARE_PROVIDER_SITE_OTHER): Payer: Self-pay | Admitting: Internal Medicine

## 2022-07-09 NOTE — Telephone Encounter (Signed)
Patients daughter called and stated she got staples put in her head by our E.R. and needs to have them removed on 07/10/2022.    I put her down as a nurse visit for 07/10/2022 at 3:00.    Thank You!    Comments added by Marla Roe on 07/09/22 at 09:19.

## 2022-07-10 ENCOUNTER — Inpatient Hospital Stay
Admission: RE | Admit: 2022-07-10 | Discharge: 2022-07-10 | Disposition: A | Discharge status: Home / Self Care | Payer: Medicare Other | Source: Ambulatory Visit | Attending: Urology | Admitting: Urology

## 2022-07-10 ENCOUNTER — Other Ambulatory Visit: Payer: Self-pay

## 2022-07-10 ENCOUNTER — Ambulatory Visit: Payer: Medicare Other | Attending: Internal Medicine

## 2022-07-10 ENCOUNTER — Other Ambulatory Visit (HOSPITAL_COMMUNITY): Payer: Self-pay | Admitting: Urology

## 2022-07-10 DIAGNOSIS — K5909 Other constipation: Secondary | ICD-10-CM

## 2022-07-10 DIAGNOSIS — R140 Abdominal distension (gaseous): Secondary | ICD-10-CM

## 2022-07-10 DIAGNOSIS — Z538 Procedure and treatment not carried out for other reasons: Secondary | ICD-10-CM | POA: Insufficient documentation

## 2022-07-10 DIAGNOSIS — N390 Urinary tract infection, site not specified: Secondary | ICD-10-CM

## 2022-07-10 DIAGNOSIS — Z4802 Encounter for removal of sutures: Secondary | ICD-10-CM

## 2022-07-11 ENCOUNTER — Other Ambulatory Visit (HOSPITAL_COMMUNITY): Payer: Self-pay | Admitting: Urology

## 2022-07-11 DIAGNOSIS — N390 Urinary tract infection, site not specified: Secondary | ICD-10-CM

## 2022-07-19 ENCOUNTER — Other Ambulatory Visit: Payer: Self-pay

## 2022-07-19 ENCOUNTER — Ambulatory Visit: Payer: Medicare Other | Attending: Internal Medicine

## 2022-07-19 ENCOUNTER — Inpatient Hospital Stay
Admission: RE | Admit: 2022-07-19 | Discharge: 2022-07-19 | Disposition: A | Discharge status: Home / Self Care | Payer: Medicare Other | Source: Ambulatory Visit | Attending: Urology | Admitting: Urology

## 2022-07-19 ENCOUNTER — Encounter (INDEPENDENT_AMBULATORY_CARE_PROVIDER_SITE_OTHER): Payer: Medicare Other | Admitting: CARDIOVASCULAR DISEASE

## 2022-07-19 DIAGNOSIS — N390 Urinary tract infection, site not specified: Secondary | ICD-10-CM

## 2022-07-19 DIAGNOSIS — N2889 Other specified disorders of kidney and ureter: Secondary | ICD-10-CM

## 2022-07-19 DIAGNOSIS — Z538 Procedure and treatment not carried out for other reasons: Secondary | ICD-10-CM | POA: Insufficient documentation

## 2022-07-19 DIAGNOSIS — N261 Atrophy of kidney (terminal): Secondary | ICD-10-CM

## 2022-07-19 DIAGNOSIS — Z4802 Encounter for removal of sutures: Secondary | ICD-10-CM | POA: Insufficient documentation

## 2022-07-19 NOTE — Progress Notes (Signed)
Removed last staple, pt doing well. No concerns or complaints at this time.

## 2022-08-14 ENCOUNTER — Other Ambulatory Visit (INDEPENDENT_AMBULATORY_CARE_PROVIDER_SITE_OTHER): Payer: Self-pay | Admitting: Internal Medicine

## 2022-08-14 MED ORDER — LOSARTAN 50 MG TABLET
50.0000 mg | ORAL_TABLET | Freq: Two times a day (BID) | ORAL | 1 refills | Status: AC
Start: 2022-08-14 — End: ?

## 2022-08-23 ENCOUNTER — Inpatient Hospital Stay
Admission: RE | Admit: 2022-08-23 | Discharge: 2022-08-23 | Disposition: A | Discharge status: Home / Self Care | Payer: Medicare Other | Source: Ambulatory Visit | Attending: Cardiovascular Disease | Admitting: Cardiovascular Disease

## 2022-08-23 ENCOUNTER — Ambulatory Visit (HOSPITAL_BASED_OUTPATIENT_CLINIC_OR_DEPARTMENT_OTHER): Payer: Self-pay

## 2022-08-23 DIAGNOSIS — Z79899 Other long term (current) drug therapy: Secondary | ICD-10-CM

## 2022-08-23 DIAGNOSIS — Z9861 Coronary angioplasty status: Secondary | ICD-10-CM | POA: Insufficient documentation

## 2022-08-23 DIAGNOSIS — I480 Paroxysmal atrial fibrillation: Secondary | ICD-10-CM | POA: Insufficient documentation

## 2022-08-23 DIAGNOSIS — I2510 Atherosclerotic heart disease of native coronary artery without angina pectoris: Secondary | ICD-10-CM

## 2022-08-23 DIAGNOSIS — Z950 Presence of cardiac pacemaker: Secondary | ICD-10-CM | POA: Insufficient documentation

## 2022-08-23 DIAGNOSIS — Z8673 Personal history of transient ischemic attack (TIA), and cerebral infarction without residual deficits: Secondary | ICD-10-CM

## 2022-08-23 NOTE — Telephone Encounter (Addendum)
Dr. Cornelia Copa advises okay to stop Plavix and take 81 mg ASA. Reviewed with pt daughter. Pt daughter v/u. Encouraged pt daughter to contact office with any further questions or concerns. Pt daughter v/u. Earlyne Iba Quinton Voth, RN      Spoke with pt daughter, Ledon Snare states that they declined the Watchman procedure in the past due to the requirement of pt being on warfarin for 45 days following the procedure. Pt daughter states pt is too high of a fall risk for this. Pt daughter inquiring if pt may increase amiodarone from 100 mg daily to 200 mg daily? Dr. Cornelia Copa advises okay to make adjustment to amiodarone 200 mg daily. Pt to have CMP, TSH, and EKG completed in one month following dose increase. Called and reviewed with pt daughter, [name]. Pt daughter v/u. Pt daughter states that pt has been on Plavix for several years following stent placement. Pt daughter states this was questioned at pt first visit with Dr. Cornelia Copa and daughter could not give an answer of why the cardiologist had pt on this medication for several years. Pt daughter states she called Dr. Edison Pace office in [place] and was told that that was the protocol that Dr. Al Pimple followed. However, he had stated that since pt is having so many falls she could stop Plavix and take low dose ASA. Pt daughter wanted this run by Dr. Cornelia Copa. Informed pt daughter would review with Dr. Cornelia Copa once he was back in the office and call with his recommendations. Pt daughter v/u. Earlyne Iba Hamza Empson, RN 08/29/2022      Received message from device clinic that pt had routine remote check. Report showed that pt has had 10 detected episodes of AF, longest lasting an hour. Pt is on Plavix. Pt has history of CVA. Pt had previously been taken off of anticoagulation due to fall risk. Pt had decided against watchman procedure in the past. Dr. Cornelia Copa reviewed report and recommended offering pt referral for watchman procedure. Called and reviewed report and Dr. Worthy Keeler recommendations with pt. Pt  states that she is going to speak with her daughter that is a Therapist, sports. Pt states will have her daughter call our office tomorrow to discuss further. Grayland Jack, RN

## 2022-08-28 ENCOUNTER — Telehealth (INDEPENDENT_AMBULATORY_CARE_PROVIDER_SITE_OTHER): Payer: Self-pay | Admitting: CARDIOVASCULAR DISEASE

## 2022-08-28 NOTE — Telephone Encounter (Signed)
Patient's daughter called asking why her mother still needed to be on Plavix since her stent was placed in 2007/2011.  Dr. Marlow Baars reviewed the chart and since the patient is having frequent falls he said it would be ok to go to ASA 81mg s a day and stop the Plavix.  The daughter voiced understanding.  Sharon Seller, RN

## 2022-08-29 NOTE — Addendum Note (Signed)
Addended by: Declyn Delsol on: 08/29/2022 01:04 PM     Modules accepted: Orders

## 2022-09-04 ENCOUNTER — Other Ambulatory Visit: Payer: Self-pay | Admitting: PHYSICIAN ASSISTANT

## 2022-09-05 NOTE — Nursing Note (Signed)
Pt here today for staple removal from ER visit. Pt tolerated removal well.

## 2022-09-18 ENCOUNTER — Ambulatory Visit: Payer: Medicare Other | Attending: Internal Medicine | Admitting: Internal Medicine

## 2022-09-18 ENCOUNTER — Encounter (INDEPENDENT_AMBULATORY_CARE_PROVIDER_SITE_OTHER): Payer: Self-pay | Admitting: Internal Medicine

## 2022-09-18 ENCOUNTER — Other Ambulatory Visit: Payer: Self-pay

## 2022-09-18 VITALS — BP 118/80 | HR 70 | Temp 97.7°F | Resp 20 | Ht 63.0 in | Wt 107.9 lb

## 2022-09-18 DIAGNOSIS — G8929 Other chronic pain: Secondary | ICD-10-CM | POA: Insufficient documentation

## 2022-09-18 DIAGNOSIS — I480 Paroxysmal atrial fibrillation: Secondary | ICD-10-CM | POA: Insufficient documentation

## 2022-09-18 DIAGNOSIS — I10 Essential (primary) hypertension: Secondary | ICD-10-CM

## 2022-09-18 DIAGNOSIS — H353212 Exudative age-related macular degeneration, right eye, with inactive choroidal neovascularization: Secondary | ICD-10-CM | POA: Insufficient documentation

## 2022-09-18 DIAGNOSIS — M069 Rheumatoid arthritis, unspecified: Secondary | ICD-10-CM | POA: Insufficient documentation

## 2022-09-18 DIAGNOSIS — N1832 Chronic kidney disease, stage 3b: Secondary | ICD-10-CM | POA: Insufficient documentation

## 2022-09-18 DIAGNOSIS — E440 Moderate protein-calorie malnutrition: Secondary | ICD-10-CM | POA: Insufficient documentation

## 2022-09-18 DIAGNOSIS — I129 Hypertensive chronic kidney disease with stage 1 through stage 4 chronic kidney disease, or unspecified chronic kidney disease: Secondary | ICD-10-CM | POA: Insufficient documentation

## 2022-09-18 DIAGNOSIS — M25512 Pain in left shoulder: Secondary | ICD-10-CM | POA: Insufficient documentation

## 2022-09-18 DIAGNOSIS — Z681 Body mass index (BMI) 19 or less, adult: Secondary | ICD-10-CM | POA: Insufficient documentation

## 2022-09-18 DIAGNOSIS — N390 Urinary tract infection, site not specified: Secondary | ICD-10-CM | POA: Insufficient documentation

## 2022-09-18 MED ORDER — METHOTREXATE SODIUM 2.5 MG TABLET
10.0000 mg | ORAL_TABLET | ORAL | 0 refills | Status: DC
Start: 2022-09-18 — End: 2023-01-11

## 2022-09-18 MED ORDER — SERTRALINE 25 MG TABLET
12.5000 mg | ORAL_TABLET | Freq: Every day | ORAL | 0 refills | Status: DC
Start: 2022-09-18 — End: 2022-12-15

## 2022-09-18 MED ORDER — FOLIC ACID 1 MG TABLET
1.0000 mg | ORAL_TABLET | Freq: Every day | ORAL | 0 refills | Status: DC
Start: 2022-09-18 — End: 2022-12-15

## 2022-09-18 NOTE — Progress Notes (Signed)
[facility]  PEDIATRICS/INTERNAL MEDICINE, [place]  [place]  [place] [address]  [phone]  09/18/2022       Name:  Theresa Greene MRN: [id number]   Date:    09/18/2022 Age:  88 y.o.       Reason for Visit:  Chief Complaint   Patient presents with    Follow Up     Patient presents to office for follow up.  Patient states she has been doing pretty good since her last visit.     Dizziness     Patient states that she has been having some dizziness and lightheadedness about 2-3 times a week.  States it seems to start about 30 min after she takes her meds.     Recurrent Urinary Tract Infections     Patient recently seen Dr. Jarrett Soho urologist and was put on daily preventative abx until her next appointment.     Shoulder Pain     C/o b/l shoulder pain.        Subjective:   Theresa Greene is a 88 y.o. female who comes in today for follow up on high blood pressure and CKD stage III. She is accompanied by her daughter who helps provide the history. She says that sometimes she has these dizzy spells about 30-40 minutes after she takes her medication in the morning. She says these are different from her usual spells. She blames it on the Myrbetriq that she was started on by the urologist. She says that this has really helped her bladder issues and she is sleeping through the night most nights. She says that the pressure sensation is gone now. She is voiding 200-400 mLs now and previously it was only about 50 mLs. They really  liked Dr. Jarrett Soho and she says that he started her on prophylactic antibiotics to prevent UTI. She is taking Keflex 250mg  oral daily. She has a home health aide from [facility]. They are coming 5 days per week. Her daughter says that this has made a world of difference. Theresa Greene really likes her and her mood has improved since this started. They are helping her with meal preparation.     Her daughter says that she saw  Dr. Cornelia Copa. They did monitoring and she had a few episodes of Afib that maintained for about an hour or so before resolving. There office had called to ask if she would be interested in a Walkman. She says that she did call them back and discussed with them that she is a fall risk and is not eligible for anticoagulation. This, this disqualifies her and she would not be a candidate for the Walkman. Theresa Greene has been wanting to get off of as many medications as possible. Her daughter says that she actually called Dr. Edison Pace office to see if she really needed to be on Plavix, as her last stents were about 5 years or so ago. He would not say that she could stop the medication, but could not give an indication for it either. Her daughter also is pointing out that she is on three medications for her cholesterol and triglycerides. She is wondering if we could stop any of these to help decrease her regimen.     She has rheumatoid arthritis and is having a lot of trouble out of her left shoulder. Her daughter says that she actually called Dr. Saul Fordyce office and he needs a new referral to  see her. She wants him to attempt to inject the left shoulder to see if this will help with her pain. She has an appointment at the end of the month for the right shoulder. She has an area of basal cell carcinoma on her left temple that has been excised several times. She feels like it is coming back and she has an appointment to see [facility] coming up to see what they have to offer for this.     Past Medical History:    Past Medical History:   Diagnosis Date    Arthritis     Asthma     Back problem     CAD S/P percutaneous coronary angioplasty 2010    Pt reports 5 years ago    Charcot's joint of foot, left 08/06/2014    Colon polyp     CVA (cerebrovascular accident) (CMS HCC) 1995    no residual deficits    Deaf     Diverticulitis of colon     Gastroesophageal reflux disease 08/06/2014    GI bleed     H/O hearing loss     History  of cardiac pacemaker 08/06/2014    HTN (hypertension)     Hyperlipidemia 12/25/2019    Muscle weakness     Neuropathy (CMS HCC)     Pacemaker     Paroxysmal atrial fibrillation (CMS HCC) 08/06/2014    PUD (peptic ulcer disease) 08/06/2014    C/b GIB Spring 2015    Rheumatoid arthritis (CMS HCC) 08/06/2014    Stage 3 chronic kidney disease (CMS HCC) 12/25/2019    Last Assessment & Plan:  Renal function stable, at baseline Stable electrolytes; restart sodium bicarb 650 mg BID  Continue renal protective ARB Discussed CKD risk factor modifications, including tight BP/glucose control, low protein diet, and avoidance of NSAIDs    Stented coronary artery     Vitamin D deficiency 12/25/2019    Wears glasses       Past Surgical History:    Past Surgical History:   Procedure Laterality Date    ANKLE SURGERY Left     repair with metal    CATARACT EXTRACTION      unsure which eye but thinks she only had one eye done    CORONARY ARTERY ANGIOPLASTY  2011    ESOPHAGOGASTRODUODENOSCOPY      HIP SURGERY Right     repair with metal    HX BACK SURGERY      lower back x 3 no metal    HX COLONOSCOPY      HX HIP REPLACEMENT Left     HX NISSEN FUNDOPLICATION  2002    then three others to repair problems    HX OPEN CHOLECYSTECTOMY  1962    with common duxt exploration    HX PACEMAKER INSERTION  2019    HX SHOULDER SURGERY Bilateral     scopes    KNEE ARTHROPLASTY Bilateral     OPEN REDUCTION INTERNAL FIXATION FRACTURE FEMUR Right 05/07/2020    Performed by Jeanell Sparrow, MD at [facility] OR [facility]TH    ORIF DISTAL FEMUR FRACTURE Right     OTHER SURGICAL HISTORY Right     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    PANCREATIC PSEUDOCYST DRAINAGE  1971    REPAIR ECTROPION Right 09/21/2021    Performed by Pierce Crane, MD at [facility] OR MAIN    REPAIR PTOSIS Left 09/21/2021  Performed by Pierce Crane, MD at [facility] OR MAIN    TOTAL ABDOMINAL HYSTERECTOMY       Family History:    Family Medical History:       Problem Relation (Age  of Onset)    Brain cancer Mother    Ovarian Cancer Mother    Pancreatic Cancer Father             Social History:   Social History     Tobacco Use    Smoking status: Never    Smokeless tobacco: Never   Substance Use Topics    Alcohol use: Never      Social History     Substance and Sexual Activity   Drug Use Never      Social History     Social History Narrative    Lives at home with husband. 2 grown daughters - an Charity fundraiser and a Mining engineer. She did Building control surveyor for 23 years.         Allergies:    Allergies   Allergen Reactions    Iodinated Contrast Media Anaphylaxis    Iv Contrast Anaphylaxis     makese her serverly sick    Etodolac NO Steroids unless approved by Attending Physician    Symbicort [Budesonide-Formoterol]     Codeine  Other Adverse Reaction (Add comment) and Nausea/ Vomiting     Stomach pain  Stomach pain        Home Medications:    Current Outpatient Medications:     acetaminophen (TYLENOL ARTHRITIS PAIN) 650 mg Oral Tablet Sustained Release, Take 2 Tablets (1,300 mg total) by mouth Twice per day as needed for Pain, Disp: , Rfl:     amiodarone (PACERONE) 200 mg Oral Tablet, Take 1 Tablet (200 mg total) by mouth Once a day, Disp: , Rfl:     amLODIPine (NORVASC) 5 mg Oral Tablet, Take 1 Tablet (5 mg total) by mouth Once a day, Disp: , Rfl:     aspirin 81 mg Oral Tablet, Chewable, Chew 1 Tablet (81 mg total) Once a day, Disp: , Rfl:     atorvastatin (LIPITOR) 20 mg Oral Tablet, Take 1 Tablet (20 mg total) by mouth Every evening, Disp: , Rfl:     calcitrioL (ROCALTROL) 0.5 mcg Oral Capsule, Take 1 Capsule (0.5 mcg total) by mouth Once a day for 360 days, Disp: 90 Capsule, Rfl: 3    cephalexin (KEFLEX) 250 mg Oral Capsule, Take 1 Capsule (250 mg total) by mouth Once a day, Disp: , Rfl:     Dexlansoprazole (DEXILANT) 60 mg Oral Cap, Delayed Rel., Multiphasic, Take 1 Capsule (60 mg total) by mouth Once a day for 180 days, Disp: 90 Capsule, Rfl: 1    ergocalciferol, vitamin D2, (DRISDOL) 1,250 mcg  (50,000 unit) Oral Capsule, Take 1 Capsule (50,000 Units total) by mouth Every MON and FRI for 336 days Takes on Fridays, Disp: 96 Capsule, Rfl: 0    folic acid (FOLVITE) 1 mg Oral Tablet, Take 1 Tablet (1 mg total) by mouth Once a day for 90 days, Disp: 90 Tablet, Rfl: 0    gabapentin (NEURONTIN) 100 mg Oral Capsule, Take 1 Capsule (100 mg total) by mouth Twice daily, Disp: 180 Capsule, Rfl: 0    gemfibroziL (LOPID) 600 mg Oral Tablet, Take 1 Tablet (600 mg total) by mouth Once a day for 360 days, Disp: 90 Tablet, Rfl: 3    lipase-protease-amylase 5,000-17,000- 24,000 unit Oral Capsule, Delayed Release(E.C.), Take 1 Capsule by  mouth Three times daily with meals for 360 days, Disp: 270 Capsule, Rfl: 3    losartan (COZAAR) 50 mg Oral Tablet, Take 1 Tablet (50 mg total) by mouth Twice daily, Disp: 180 Tablet, Rfl: 1    LYSINE ORAL, Take 100 mg by mouth Once per day as needed, Disp: , Rfl:     melatonin 10 mg Oral Tablet, Take 1 Tablet (10 mg total) by mouth Every night, Disp: , Rfl:     methotrexate 2.5 mg Oral tablet, Take 4 Tablets (10 mg total) by mouth Every 7 days for 90 days Takes on Thursdays, Disp: 48 Tablet, Rfl: 0    metoprolol succinate (TOPROL-XL) 100 mg Oral Tablet Sustained Release 24 hr, Take 1 Tablet (100 mg total) by mouth Once a day, Disp: 90 Tablet, Rfl: 4    MYRBETRIQ 50 mg Oral Tablet Sustained Release 24 hr, Take 1 Tablet (50 mg total) by mouth Once a day, Disp: , Rfl:     nitroGLYCERIN (NITROSTAT) 0.4 mg Sublingual Tablet, Sublingual, Place 1 Tablet (0.4 mg total) under the tongue Every 5 minutes as needed for Chest pain (after three doses, call ER if pain still persists) for 3 doses over 15 minutes, Disp: 25 Tablet, Rfl: 5    sertraline (ZOLOFT) 25 mg Oral Tablet, Take 0.5 Tablets (12.5 mg total) by mouth Once a day for 90 days, Disp: 45 Tablet, Rfl: 0    sodium bicarbonate 650 mg Oral Tablet, Take 1 Tablet (650 mg total) by mouth Twice daily, Disp: 180 Tablet, Rfl: 3       Review of Systems:    All systems have been reviewed and found to be negative except for as stated above in the history of present illness.     Physical Examination:   Vitals: BP 118/80 (Site: Right, Patient Position: Sitting, Cuff Size: Adult)   Pulse 70   Temp 36.5 C (97.7 F) (Temporal)   Resp 20   Ht 1.6 m (5\' 3" )   Wt 48.9 kg (107 lb 14.4 oz)   SpO2 96%   BMI 19.11 kg/m     General:  No apparent distress.  Interactive.  Frail and chronically ill appearing.  HEENT:  Atraumatic, normocephalic.  EOMI. Nares patent without lesion or discharge. Mucous membranes moist. Tongue protrudes midline. No tonsillar erythema or exudates. Glasses.   Neck:  Supple. Trachea is midline. No JVD. No adenopathy. No thyromegaly. No carotid bruits.  Respiratory: No respiratory distress. Lungs clear to auscultation bilaterally. No wheezes, rales, or rhonchi.  Cardiovascular: Regular rate and rhythm. S1, S2 present. No murmurs, rubs, or gallops.  Skin: Pink and warm. No rashes or petechiae noted.  Extremities:  No clubbing, cyanosis or edema.   Psychiatric:  Mood and affect appropriate. Memory intact with good short- and long-term recall. Judgement and insight appear appropriate. No suicidal or homicidal ideation.    Assessment:   (I10) Primary hypertension  (primary encounter diagnosis)  Plan: CBC, COMPREHENSIVE METABOLIC PANEL,         NON-FASTING, LIPID PANEL    (N18.32) Stage 3b chronic kidney disease (CMS HCC)  Plan: COMPREHENSIVE METABOLIC PANEL, NON-FASTING,         VITAMIN D 25 TOTAL, PHOSPHORUS, PARATHYROID         HORMONE (PTH)    (N39.0) Recurrent UTI (urinary tract infection)    (M06.9) Rheumatoid arthritis, involving unspecified site, unspecified whether rheumatoid factor present (CMS HCC)  Plan: Refer to [facility]    (M25.512,  G89.29) Chronic left  shoulder pain  Plan: Refer to [place]    (E44.0) Moderate protein-calorie malnutrition (CMS HCC)    (I48.0) Paroxysmal atrial fibrillation (CMS HCC)  Plan: CBC,  COMPREHENSIVE METABOLIC PANEL,         NON-FASTING, THYROID STIMULATING HORMONE WITH         FREE T4 REFLEX    (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 Continue treatment. Call if any issues.   Last creatinine was 1.39. this was stable and about where she typically runs. Recheck CMP, phosphorus, PTH, and vitamin D levels prior to next appointment. Avoid nephrotoxic agents.   Following with urology, Dr. Jarrett Soho. Currently on prophylaxis with Keflex 250mg  oral daily. She is also on Myrbetriq 50 mg oral daily for urinary urgency. Continue to monitor. Much improved per patient.   On MTX with folic acid. Refer to Dr. Owens Shark for steroid injection of left shoulder.   Her weight is up 2 Greene since her last visit, but she is still very thin and frail. Encouraged well balanced diet. Could do protein shakes.   Currently rate is controlled. Continue on amiodarone 200 mg oral daily and Toprol XL 100 mg oral daily. Not a candidate for anticoagulation. CMP and TSH pending for Dr. Cornelia Copa.   Follows with Dance movement psychotherapist.     Patient was counseled on diagnosis, follow-up, and side effect of medications.  Reasons for follow up in clinic or emergency department were discussed.    Orders:    Orders Placed This Encounter    CBC    COMPREHENSIVE METABOLIC PANEL, NON-FASTING    THYROID STIMULATING HORMONE WITH FREE T4 REFLEX    LIPID PANEL    VITAMIN D 25 TOTAL    PHOSPHORUS    PARATHYROID HORMONE (PTH)    Refer to [facility]    folic acid (FOLVITE) 1 mg Oral Tablet    methotrexate 2.5 mg Oral tablet    sertraline (ZOLOFT) 25 mg Oral Tablet      Referrals:   Orders Placed This Encounter   Procedures    Refer to [facility]      Discharge Medication List:       Current Discharge Medication List            Accurate as of September 18, 2022  3:21  PM. If you have any questions, ask your nurse or doctor.                CONTINUE these medications which have CHANGED during your visit.        Details   amiodarone 200 mg Tablet  Commonly known as: PACERONE  What changed: Another medication with the same name was removed. Continue taking this medication, and follow the directions you see here.  Changed by: Earnest Bailey Hill-Reinert, DO   200 mg, Oral, DAILY  Refills: 0            CONTINUE these medications - NO CHANGES were made during your visit.        Details   acetaminophen 650 mg Tablet Sustained Release  Commonly known as: TYLENOL ARTHRITIS PAIN   1,300 mg, Oral, 2 TIMES DAILY PRN  Refills: 0     amLODIPine 5 mg Tablet  Commonly known as: NORVASC   5 mg, Oral, DAILY  Refills: 0     aspirin 81 mg  Tablet, Chewable   81 mg, Oral, DAILY  Refills: 0     atorvastatin 20 mg Tablet  Commonly known as: LIPITOR   20 mg, Oral, EVERY EVENING  Refills: 0     calcitrioL 0.5 mcg Capsule  Commonly known as: ROCALTROL   0.5 mcg, Oral, DAILY  Qty: 90 Capsule  Refills: 3     cephalexin 250 mg Capsule  Commonly known as: KEFLEX   Take 1 Capsule (250 mg total) by mouth Once a day  Refills: 0     Dexlansoprazole 60 mg Cap, Delayed Rel., Multiphasic  Commonly known as: DEXILANT   60 mg, Oral, DAILY  Qty: 90 Capsule  Refills: 1     ergocalciferol (vitamin D2) 1,250 mcg (50,000 unit) Capsule  Commonly known as: DRISDOL   50,000 Units, Oral, EVERY MO AND FR, Takes on Fridays  Qty: 96 Capsule  Refills: 0     folic acid 1 mg Tablet  Commonly known as: FOLVITE   1 mg, Oral, DAILY  Qty: 90 Tablet  Refills: 0     gabapentin 100 mg Capsule  Commonly known as: NEURONTIN   100 mg, Oral, 2 TIMES DAILY  Qty: 180 Capsule  Refills: 0     gemfibroziL 600 mg Tablet  Commonly known as: LOPID   600 mg, Oral, DAILY  Qty: 90 Tablet  Refills: 3     lipase-protease-amylase 5,000-17,000- 24,000 unit Capsule, Delayed Release(E.C.)   1 Capsule, Oral, 3 TIMES DAILY WITH MEALS  Qty: 270 Capsule  Refills: 3      losartan 50 mg Tablet  Commonly known as: COZAAR   50 mg, Oral, 2 TIMES DAILY  Qty: 180 Tablet  Refills: 1     LYSINE ORAL   100 mg, Oral, DAILY PRN  Refills: 0     melatonin 10 mg Tablet   10 mg, Oral, NIGHTLY  Refills: 0     methotrexate 2.5 mg tablet   10 mg, Oral, EVERY 7 DAYS, Takes on Thursdays  Qty: 48 Tablet  Refills: 0     metoprolol succinate 100 mg Tablet Sustained Release 24 hr  Commonly known as: TOPROL-XL   100 mg, Oral, DAILY  Qty: 90 Tablet  Refills: 4     Myrbetriq 50 mg Tablet Sustained Release 24 hr  Generic drug: mirabegron   Take 1 Tablet (50 mg total) by mouth Once a day  Refills: 0     nitroGLYCERIN 0.4 mg Tablet, Sublingual  Commonly known as: NITROSTAT   0.4 mg, Sublingual, EVERY 5 MIN PRN, for 3 doses over 15 minutes  Qty: 25 Tablet  Refills: 5     sertraline 25 mg Tablet  Commonly known as: ZOLOFT   12.5 mg, Oral, DAILY  Qty: 45 Tablet  Refills: 0     sodium bicarbonate 650 mg Tablet   650 mg, Oral, 2 TIMES DAILY  Qty: 180 Tablet  Refills: 3            STOP taking these medications.      celecoxib 200 mg Capsule  Commonly known as: CeleBREX  Stopped by: Porcha Deblanc Hill-Reinert, DO     clopidogreL 75 mg Tablet  Commonly known as: PLAVIX  Stopped by: [employer], DO     ezetimibe 10 mg Tablet  Commonly known as: ZETIA  Stopped by: [employer], DO                 Follow up: Return in about 4 months (around 01/17/2023) for HTN,  CKD with laboratory review.. Patient was advised to follow up sooner if issues arise.     Prisca Gearing Hill-Reinert, DO    This note was partially created using M*Modal fluency direct system (voice recognition software ) and is inherently subject to errors including those of syntax and "sound- alike" substitutions which may escape proofreading.  In such instances, original meaning may be extrapolated by contextual derivation.

## 2022-10-03 ENCOUNTER — Ambulatory Visit (HOSPITAL_BASED_OUTPATIENT_CLINIC_OR_DEPARTMENT_OTHER): Payer: Self-pay

## 2022-10-03 NOTE — Telephone Encounter (Signed)
Returned pt daughter call. Pt daughter states that pt received a call to schedule a visit in [place]. However, she is unable to travel from [place] to [place] for appt. Pt daughter needing clarification. Informed pt daughter that Dr. Cornelia Copa is not scheduled in [place] office for March or April. However, telephone visit can be conduced and pt may have EKG/labs completed at [facility] Pt daughter v/u. Pt daughter to contact PCA to schedule telephone visit. Encouraged pt daughter to contact office with any further questions or concerns. Pt daughter v/u. Grayland Jack, RN

## 2022-10-04 ENCOUNTER — Other Ambulatory Visit (INDEPENDENT_AMBULATORY_CARE_PROVIDER_SITE_OTHER): Payer: Self-pay | Admitting: Internal Medicine

## 2022-10-08 ENCOUNTER — Ambulatory Visit: Payer: Medicare Other | Attending: Internal Medicine | Admitting: Orthopaedic Surgery

## 2022-10-08 ENCOUNTER — Encounter (INDEPENDENT_AMBULATORY_CARE_PROVIDER_SITE_OTHER): Payer: Self-pay | Admitting: Orthopaedic Surgery

## 2022-10-08 ENCOUNTER — Other Ambulatory Visit: Payer: Self-pay

## 2022-10-08 VITALS — BP 155/98 | HR 70 | Temp 97.1°F | Resp 18 | Ht 63.0 in | Wt 107.0 lb

## 2022-10-08 DIAGNOSIS — M75102 Unspecified rotator cuff tear or rupture of left shoulder, not specified as traumatic: Secondary | ICD-10-CM | POA: Insufficient documentation

## 2022-10-08 DIAGNOSIS — M12811 Other specific arthropathies, not elsewhere classified, right shoulder: Secondary | ICD-10-CM | POA: Insufficient documentation

## 2022-10-08 DIAGNOSIS — M12812 Other specific arthropathies, not elsewhere classified, left shoulder: Secondary | ICD-10-CM | POA: Insufficient documentation

## 2022-10-08 DIAGNOSIS — M25511 Pain in right shoulder: Secondary | ICD-10-CM | POA: Insufficient documentation

## 2022-10-08 DIAGNOSIS — M25512 Pain in left shoulder: Secondary | ICD-10-CM | POA: Insufficient documentation

## 2022-10-08 DIAGNOSIS — M75101 Unspecified rotator cuff tear or rupture of right shoulder, not specified as traumatic: Secondary | ICD-10-CM | POA: Insufficient documentation

## 2022-10-08 DIAGNOSIS — G8929 Other chronic pain: Secondary | ICD-10-CM | POA: Insufficient documentation

## 2022-10-08 MED ORDER — LIDOCAINE HCL 10 MG/ML (1 %) INJECTION SOLUTION
4.0000 mL | Freq: Once | INTRAMUSCULAR | Status: AC
Start: 2022-10-08 — End: 2022-10-08
  Administered 2022-10-08: 40 mg via INTRAMUSCULAR

## 2022-10-08 MED ORDER — TRIAMCINOLONE ACETONIDE 40 MG/ML SUSPENSION FOR INJECTION
40.0000 mg | INTRAMUSCULAR | Status: AC
Start: 2022-10-08 — End: 2022-10-08
  Administered 2022-10-08: 40 mg via INTRA_ARTICULAR

## 2022-10-08 NOTE — Progress Notes (Signed)
[facility]  Department of Orthopedics  [place] [address]  Phone [phone]      Patient: Theresa Greene    Date of Appointment: 10/08/2022     Reason for Visit: bilat shoulder pain    Subjective: 88 yo fem first seen in consultation after she had been admitted by hospitalist for rt pubic rami fxs at which time non displaced fem neck fx was also suspected but she had previously undergone cephalomed nailing for hip fx.  Also treated for nondisplaced distal pole patella fx around patella component of TKA.  Pt has been seen back for chronic rt shoulder pain where she shows signs of rotator cuff arthropathy and underwent cortisone inj in Oct which helped for several months.  Was referred by her PCP for left shoulder pain too and also shows signs rotator cuff arthropathy there as well.  Both shoulders with metallic anchors c/w prior RC repair which she thinks was done by Dr Joyice Faster.  Possible shoulder replacement surgery had been discussed before and pt now requesting referral for this.  Had seen Dr Roslynn Amble at [facility] for Rt TKA and other and would be okay with seeing Dr Anthonette Legato for her rt shoulder.  Provided AAOS info on reverse shoulder replacement.  Would still like to have shots for both of her shoulders today.      Objective:  Vitals:    10/08/22 1111   BP: (!) 155/98   Pulse: 70   Resp: 18   Temp: 36.2 C (97.1 F)   TempSrc: Temporal   SpO2: 98%   Weight: 48.5 kg (107 lb)   Height: 1.6 m ('5\' 3"'$ )   BMI: 18.99         Body mass index is 18.95 kg/m.  Gen: alert, oriented, NAD  Shoulders: scars c/w prior surgery, pos atrophy bilat shoulders, decreased overhead with pain and crepitus  Neuro: intact BUE    Radiographic/other Studies: reviewed, see above, signs RC arthropathy bilat shoulders,  appears to have some erosion of acromion left shoulder with deltoid HO on prior images from 2022    Assessment:    (M25.511,  G89.29) Chronic right shoulder pain  (primary encounter  diagnosis)  Plan: Joint Asp/Inj    (M75.101,  M12.811) Right rotator cuff tear arthropathy  Plan: Refer to [facility], Joint Asp/Inj    (M25.512,  G89.29) Chronic left shoulder pain  Plan: Joint Asp/Inj    (M75.102,  M12.812) Left rotator cuff tear arthropathy  Plan: Refer to [facility], Joint Asp/Inj       Plan:  The patients diagnosis and treatment options, both non-surgical and surgical, were discussed at length.  Risks, benefits, alternatives and potential complications of these were discussed.  Findings of any radiographic studies or laboratory evaluation were discussed with the patient.  The  patient was given the opportunity to ask questions which were  then answered to the best of my ability.  The patient was informed of any follow up care or further studies needed and showed understanding of these.  These actions were discussed with the patient.    Activity/Work Modification: as tolerated  DME/Cast/Splint: none  Additional Medical Record needed for review: prior Ortho if available  Medications: reviewed  Physical/Occupational Therapy: none  Radiographic Studies: reviewed, discussed  Therapeutic or Diagnostic Injections: requests bilat shoulder SA cort injs today  Surgical Intervention: possible candidate for reverse shoulder replacement,  AAOS info on reverse shoulder provided  Referral: to Dr Anthonette Legato, [place] for bilat  shoulders  Follow up: prn and with Dr Dorette Grate, [place]  [place] [address]  Operated by [facility]  Procedure Note    Name: Amany Sivilay MRN:  [id number]   Date: 10/08/2022 Age: 88 y.o.  DOB:   [birth year]       Joint Asp/Inj    Performed by: Gerrie Nordmann, MD  Authorized by: Gerrie Nordmann, MD    Consent:     Consent obtained:  Verbal (The patient elected to proceed with a cortisone injection.)    Consent given by:  Patient    Procedural risks discussed: Risks, benefits, alternatives and potential complications of  these were discussed.  Universal protocol:     Procedure explained and questions answered to patient or proxy's satisfaction: yes      Test results available and properly labeled: yes      Imaging studies available: yes      Immediately prior to procedure, a time out was called: yes      Patient identity confirmed:  Verbally with patient  Location:     Location:  Shoulder (bilat shoulder subacromial/intraarticular)  Anesthesia (see MAR for exact dosages):     Anesthesia method:  None  Procedure details:     Preparation: Patient was prepped and draped in usual sterile fashion      Ultrasound guidance: no      Steroid injected: yes (40 mg kenalog and 4 ml 1% lidocaine plain bilat shoulders)      Specimen collected: no    Post-procedure details:     Dressing:  Adhesive bandage    Patient tolerance of procedure:  Tolerated well, no immediate complications      Gerrie Nordmann, MD     Orders Placed This Encounter    Joint Asp/Inj    Refer to [facility]    lidocaine 1% injection    triamcinolone acetonide (KENALOG-40) 40 mg/mL injection    lidocaine 1% injection    triamcinolone acetonide (KENALOG-40) 40 mg/mL injection            Gerrie Nordmann, MD

## 2022-10-08 NOTE — Procedures (Signed)
[facility]  [address]  [place] [address]  Operated by [facility]  Procedure Note    Name: Alayha Edelman MRN:  [id number]   Date: 10/08/2022 Age: 88 y.o.  DOB:   1934-12-07       Joint Asp/Inj    Performed by: Gerrie Nordmann, MD  Authorized by: Gerrie Nordmann, MD    Consent:     Consent obtained:  Verbal (The patient elected to proceed with a cortisone injection.)    Consent given by:  Patient    Procedural risks discussed: Risks, benefits, alternatives and potential complications of these were discussed.  Universal protocol:     Procedure explained and questions answered to patient or proxy's satisfaction: yes      Test results available and properly labeled: yes      Imaging studies available: yes      Immediately prior to procedure, a time out was called: yes      Patient identity confirmed:  Verbally with patient  Location:     Location:  Shoulder (bilat shoulder subacromial/intraarticular)  Anesthesia (see MAR for exact dosages):     Anesthesia method:  None  Procedure details:     Preparation: Patient was prepped and draped in usual sterile fashion      Ultrasound guidance: no      Steroid injected: yes (40 mg kenalog and 4 ml 1% lidocaine plain bilat shoulders)      Specimen collected: no    Post-procedure details:     Dressing:  Adhesive bandage    Patient tolerance of procedure:  Tolerated well, no immediate complications      Gerrie Nordmann, MD

## 2022-10-08 NOTE — Nursing Note (Signed)
Travel Screening       Question Response    Have you been in contact with someone who was sick? --    Do you have any of the following new or worsening symptoms? None of these    Have you traveled internationally in the last month? No          Travel History   Travel since 09/07/22    No documented travel since 09/07/22       Caprice Red, LPN

## 2022-10-19 ENCOUNTER — Other Ambulatory Visit: Payer: Self-pay

## 2022-10-19 ENCOUNTER — Other Ambulatory Visit: Payer: Medicare Other | Attending: Cardiovascular Disease

## 2022-10-19 ENCOUNTER — Inpatient Hospital Stay (HOSPITAL_COMMUNITY)
Admission: RE | Admit: 2022-10-19 | Discharge: 2022-10-19 | Disposition: A | Discharge status: Home / Self Care | Payer: Medicare Other | Source: Ambulatory Visit | Attending: Cardiovascular Disease | Admitting: Cardiovascular Disease

## 2022-10-19 DIAGNOSIS — N1832 Chronic kidney disease, stage 3b: Secondary | ICD-10-CM | POA: Insufficient documentation

## 2022-10-19 DIAGNOSIS — I10 Essential (primary) hypertension: Secondary | ICD-10-CM | POA: Insufficient documentation

## 2022-10-19 DIAGNOSIS — Z5181 Encounter for therapeutic drug level monitoring: Secondary | ICD-10-CM | POA: Insufficient documentation

## 2022-10-19 DIAGNOSIS — I480 Paroxysmal atrial fibrillation: Secondary | ICD-10-CM

## 2022-10-19 DIAGNOSIS — Z79899 Other long term (current) drug therapy: Secondary | ICD-10-CM | POA: Insufficient documentation

## 2022-10-19 LAB — COMPREHENSIVE METABOLIC PANEL, NON-FASTING
ALBUMIN: 3.4 g/dL (ref 3.4–4.8)
ALKALINE PHOSPHATASE: 93 U/L (ref 55–145)
ALT (SGPT): 12 U/L (ref 8–22)
ANION GAP: 11 mmol/L (ref 4–13)
AST (SGOT): 24 U/L (ref 8–45)
BILIRUBIN TOTAL: 0.4 mg/dL (ref 0.3–1.3)
BUN/CREA RATIO: 19 (ref 6–22)
BUN: 24 mg/dL (ref 8–25)
CALCIUM: 9.1 mg/dL (ref 8.6–10.3)
CHLORIDE: 107 mmol/L (ref 96–111)
CO2 TOTAL: 24 mmol/L (ref 23–31)
CREATININE: 1.26 mg/dL — ABNORMAL HIGH (ref 0.60–1.05)
GLUCOSE: 94 mg/dL (ref 65–125)
POTASSIUM: 4.2 mmol/L (ref 3.5–5.1)
PROTEIN TOTAL: 6.9 g/dL (ref 6.0–8.0)
SODIUM: 142 mmol/L (ref 136–145)
eGFRcr - FEMALE: 42 mL/min/BSA — ABNORMAL LOW (ref >=60)

## 2022-10-19 LAB — THYROID STIMULATING HORMONE (SENSITIVE TSH): TSH: 1.667 u[IU]/mL (ref 0.350–4.940)

## 2022-10-20 LAB — ECG 12 LEAD
Atrial Rate: 70 {beats}/min
Calculated P Axis: 87 degrees
Calculated R Axis: 82 degrees
Calculated T Axis: 84 degrees
PR Interval: 246 ms
QRS Duration: 100 ms
QT Interval: 432 ms
QTC Calculation: 466 ms
Ventricular rate: 70 {beats}/min

## 2022-10-23 ENCOUNTER — Ambulatory Visit (HOSPITAL_BASED_OUTPATIENT_CLINIC_OR_DEPARTMENT_OTHER): Payer: Self-pay

## 2022-10-23 NOTE — Telephone Encounter (Signed)
Reviewed pt recent lab results and EKG with Dr. Cornelia Copa. He advises no changes to current treatment plan. Called and reviewed results and Dr. Worthy Keeler recommendations with pt daughter, Ledon Snare v/u. Encouraged pt to contact office with any further questions or concerns. [name] v/u. Grayland Jack, RN

## 2022-11-02 ENCOUNTER — Other Ambulatory Visit: Payer: Self-pay | Admitting: PHYSICIAN ASSISTANT

## 2022-11-10 ENCOUNTER — Other Ambulatory Visit (INDEPENDENT_AMBULATORY_CARE_PROVIDER_SITE_OTHER): Payer: Self-pay | Admitting: Internal Medicine

## 2022-11-22 ENCOUNTER — Inpatient Hospital Stay
Admission: RE | Admit: 2022-11-22 | Discharge: 2022-11-22 | Disposition: A | Discharge status: Home / Self Care | Payer: Medicare Other | Source: Ambulatory Visit | Attending: Cardiovascular Disease | Admitting: Cardiovascular Disease

## 2022-11-22 DIAGNOSIS — Z9861 Coronary angioplasty status: Secondary | ICD-10-CM | POA: Insufficient documentation

## 2022-11-22 DIAGNOSIS — I480 Paroxysmal atrial fibrillation: Secondary | ICD-10-CM | POA: Insufficient documentation

## 2022-11-22 DIAGNOSIS — Z8673 Personal history of transient ischemic attack (TIA), and cerebral infarction without residual deficits: Secondary | ICD-10-CM

## 2022-11-22 DIAGNOSIS — I2510 Atherosclerotic heart disease of native coronary artery without angina pectoris: Secondary | ICD-10-CM | POA: Insufficient documentation

## 2022-11-22 DIAGNOSIS — Z950 Presence of cardiac pacemaker: Secondary | ICD-10-CM | POA: Insufficient documentation

## 2022-11-28 ENCOUNTER — Other Ambulatory Visit: Payer: Self-pay

## 2022-11-28 ENCOUNTER — Encounter (HOSPITAL_BASED_OUTPATIENT_CLINIC_OR_DEPARTMENT_OTHER): Payer: Self-pay | Admitting: PHYSICIAN ASSISTANT

## 2022-11-28 ENCOUNTER — Ambulatory Visit (INDEPENDENT_AMBULATORY_CARE_PROVIDER_SITE_OTHER): Payer: Medicare Other | Admitting: PHYSICIAN ASSISTANT

## 2022-11-28 VITALS — BP 162/88 | HR 76 | Ht 63.0 in | Wt 111.0 lb

## 2022-11-28 DIAGNOSIS — Z8673 Personal history of transient ischemic attack (TIA), and cerebral infarction without residual deficits: Secondary | ICD-10-CM

## 2022-11-28 DIAGNOSIS — R296 Repeated falls: Secondary | ICD-10-CM

## 2022-11-28 DIAGNOSIS — I2510 Atherosclerotic heart disease of native coronary artery without angina pectoris: Secondary | ICD-10-CM

## 2022-11-28 DIAGNOSIS — Z9861 Coronary angioplasty status: Secondary | ICD-10-CM

## 2022-11-28 DIAGNOSIS — I480 Paroxysmal atrial fibrillation: Secondary | ICD-10-CM

## 2022-11-28 DIAGNOSIS — I10 Essential (primary) hypertension: Secondary | ICD-10-CM

## 2022-11-28 DIAGNOSIS — J449 Chronic obstructive pulmonary disease, unspecified: Secondary | ICD-10-CM

## 2022-11-28 DIAGNOSIS — Z950 Presence of cardiac pacemaker: Secondary | ICD-10-CM

## 2022-11-28 DIAGNOSIS — R0600 Dyspnea, unspecified: Secondary | ICD-10-CM

## 2022-11-28 DIAGNOSIS — E782 Mixed hyperlipidemia: Secondary | ICD-10-CM

## 2022-11-28 DIAGNOSIS — Z79899 Other long term (current) drug therapy: Secondary | ICD-10-CM

## 2022-11-28 DIAGNOSIS — Z5181 Encounter for therapeutic drug level monitoring: Secondary | ICD-10-CM

## 2022-11-28 MED ORDER — AMIODARONE 200 MG TABLET
200.0000 mg | ORAL_TABLET | Freq: Every day | ORAL | 3 refills | Status: AC
Start: 2022-11-28 — End: ?

## 2022-11-28 NOTE — Progress Notes (Signed)
[facility]  [address]  [place] [address]  [phone]    Date:   11/28/2022  Name: Theresa Greene  Age: 88 y.o.    REASON FOR VISIT:   Follow Up 6 Months     HISTORY OF PRESENTING ILLNESS:    Theresa Greene is a 88 y.o. female who follows in our office with a history of coronary artery disease status post PCI, history of cardiac pacemaker, hypertension, hyperlipidemia, atrial fibrillation, history of CVA/TIA, and dyspnea.  She was initially evaluated in the office in October of 2023.  Patient had previously been following in [place] for cardiac care however wished to move closer to her residence.  The patient lives in [place].  She reported 1 generator change out of her [employer] device.  She had 1 cardiac stent placed in the remote past as well.  Patient is on amiodarone 100 mg daily for rhythm control of atrial fibrillation.  She was taken off of anticoagulation due to high fall risk.  The family and patient had the discussion about Watchman device but this was decided against.    The patient presents today for a follow-up appointment.  Patient reports overall she is doing well.  Per Dr. Marlene Bast amiodarone was increased to 200 mg daily based on recent pacemaker interrogation and essentially unremarkable PFTs.  She continues to remain off anticoagulation due to high fall risk.  She denies chest pain, shortness breath, palpitations, or syncope.  She reports chronic history of lightheadedness, she has a history of orthostatic hypotension, and daughter who was present with her during today's visit reports there is no change from previous.  She reports falling once since her last visit, per daughter's report she received sutures in her head to repair the laceration.  Patient is ambulating with a walker today.      Current Outpatient Medications   Medication Sig    acetaminophen (TYLENOL ARTHRITIS PAIN) 650 mg Oral Tablet Sustained Release Take 2 Tablets (1,300 mg  total) by mouth Twice per day as needed for Pain    amiodarone (PACERONE) 200 mg Oral Tablet Take 1 Tablet (200 mg total) by mouth Once a day    amLODIPine (NORVASC) 5 mg Oral Tablet Take 1 Tablet (5 mg total) by mouth Once a day    aspirin 81 mg Oral Tablet, Chewable Chew 1 Tablet (81 mg total) Once a day    atorvastatin (LIPITOR) 20 mg Oral Tablet Take 1 Tablet (20 mg total) by mouth Every evening    calcitrioL (ROCALTROL) 0.5 mcg Oral Capsule Take 1 Capsule (0.5 mcg total) by mouth Once a day for 360 days    cephalexin (KEFLEX) 250 mg Oral Capsule Take 1 Capsule (250 mg total) by mouth Once a day    Dexlansoprazole (DEXILANT) 60 mg Oral Cap, Delayed Rel., Multiphasic TAKE 1 CAPSULE BY MOUTH ONCE DAILY    ergocalciferol, vitamin D2, (DRISDOL) 1,250 mcg (50,000 unit) Oral Capsule Take 1 Capsule (50,000 Units total) by mouth Every MON and FRI for 336 days Takes on Fridays    folic acid (FOLVITE) 1 mg Oral Tablet Take 1 Tablet (1 mg total) by mouth Once a day for 90 days    gabapentin (NEURONTIN) 100 mg Oral Capsule Take 1 Capsule (100 mg total) by mouth Twice daily    gemfibroziL (LOPID) 600 mg Oral Tablet Take 1 Tablet (600 mg total) by mouth Once a day for 360 days    lipase-protease-amylase 5,000-17,000- 24,000 unit Oral  Capsule, Delayed Release(E.C.) Take 1 Capsule by mouth Three times daily with meals for 360 days    losartan (COZAAR) 50 mg Oral Tablet Take 1 Tablet (50 mg total) by mouth Twice daily    LYSINE ORAL Take 100 mg by mouth Once per day as needed    melatonin 10 mg Oral Tablet Take 1 Tablet (10 mg total) by mouth Every night    methotrexate 2.5 mg Oral tablet Take 4 Tablets (10 mg total) by mouth Every 7 days for 90 days Takes on Thursdays    metoprolol succinate (TOPROL-XL) 100 mg Oral Tablet Sustained Release 24 hr Take 1 Tablet (100 mg total) by mouth Once a day    MYRBETRIQ 50 mg Oral Tablet Sustained Release 24 hr Take 1 Tablet (50 mg total) by mouth Once a day    nitroGLYCERIN (NITROSTAT) 0.4  mg Sublingual Tablet, Sublingual Place 1 Tablet (0.4 mg total) under the tongue Every 5 minutes as needed for Chest pain (after three doses, call ER if pain still persists) for 3 doses over 15 minutes    sertraline (ZOLOFT) 25 mg Oral Tablet Take 0.5 Tablets (12.5 mg total) by mouth Once a day for 90 days    sodium bicarbonate 650 mg Oral Tablet Take 1 Tablet (650 mg total) by mouth Twice daily     Allergies   Allergen Reactions    Iodinated Contrast Media Anaphylaxis    Iv Contrast Anaphylaxis     makese her serverly sick    Etodolac NO Steroids unless approved by Attending Physician    Symbicort [Budesonide-Formoterol]     Codeine  Other Adverse Reaction (Add comment) and Nausea/ Vomiting     Stomach pain  Stomach pain       Past Medical History:   Diagnosis Date    Arthritis     Asthma     Back problem     CAD S/P percutaneous coronary angioplasty 2010    Pt reports 5 years ago    Charcot's joint of foot, left 08/06/2014    Colon polyp     CVA (cerebrovascular accident) (CMS HCC) 1995    no residual deficits    Deaf     Diverticulitis of colon     Gastroesophageal reflux disease 08/06/2014    GI bleed     H/O hearing loss     History of cardiac pacemaker 08/06/2014    HTN (hypertension)     Hyperlipidemia 12/25/2019    Muscle weakness     Neuropathy (CMS HCC)     Pacemaker     Paroxysmal atrial fibrillation (CMS HCC) 08/06/2014    PUD (peptic ulcer disease) 08/06/2014    C/b GIB Spring 2015    Rheumatoid arthritis (CMS HCC) 08/06/2014    Stage 3 chronic kidney disease (CMS HCC) 12/25/2019    Last Assessment & Plan:  Renal function stable, at baseline Stable electrolytes; restart sodium bicarb 650 mg BID  Continue renal protective ARB Discussed CKD risk factor modifications, including tight BP/glucose control, low protein diet, and avoidance of NSAIDs    Stented coronary artery     Vitamin D deficiency 12/25/2019    Wears glasses          Past Surgical History:   Procedure Laterality Date    ANKLE SURGERY Left      repair with metal    CATARACT EXTRACTION      unsure which eye but thinks she only had one eye done    CORONARY  ARTERY ANGIOPLASTY  2011    ESOPHAGOGASTRODUODENOSCOPY      HIP SURGERY Right     repair with metal    HX BACK SURGERY      lower back x 3 no metal    HX COLONOSCOPY      HX HIP REPLACEMENT Left     HX NISSEN FUNDOPLICATION  2002    then three others to repair problems    HX OPEN CHOLECYSTECTOMY  1962    with common duxt exploration    HX PACEMAKER INSERTION  2019    HX SHOULDER SURGERY Bilateral     scopes    KNEE ARTHROPLASTY Bilateral     ORIF DISTAL FEMUR FRACTURE Right     OTHER SURGICAL HISTORY Right     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 2 grown daughters - an Charity fundraiser and a Mining engineer. She did Building control surveyor for 23 years.      Social Determinants of Health     Financial Resource Strain: Low Risk  (06/15/2022)    Financial Resource Strain     SDOH Financial: No   Transportation Needs: Low Risk  (06/15/2022)    Transportation Needs     SDOH Transportation: No   Social Connections: Low Risk  (06/15/2022)    Social Connections     SDOH Social Isolation: 5 or more times a week   Intimate Partner Violence: Low Risk  (06/15/2022)    Intimate Partner Violence     SDOH Domestic Violence: No   Housing Stability: Low Risk  (06/15/2022)    Housing Stability     SDOH Housing Situation: I have housing.       REVIEW OF SYSTEMS:     Constitutional: No fevers, chills, malaise or abnormal weight loss.    HENT: No tinnitus, vertigo, hearing loss, ear pain, sinus drainage, nasal congestion or throat pain.  Eyes: No vision changes, diplopia, drainage, pain  Respiratory: See HPI.  Cardiovascular: See HPI.  Gastrointestinal: No abdominal pain, nausea, vomiting, diarrhea, constipation, melana or hematochezia   Endocrine: No polyuria, polydipsia, heat or cold intolerance.  Genitourinary: No dysuria, urgency, frequency, hematuria, nocturia.  Musculoskeletal: No myalgias, arthralgias.  Skin: No rashes, suspicious lesions.  Neurological: No headaches, weakness, paresthesias  Hematological:  No bleeding, spontaneous bruising.  Psychiatric/Behavioral: No confusion, agitation, hallucinations, paranoia, delusions.    All other systems reviewed and are negative except as noted in HOPI.    PHYSICAL EXAM:  Vitals reviewed. BP (!) 162/88 Comment: RTA  Pulse 76   Ht 1.6 m (5\' 3" )   Wt 50.3 kg (111 lb)  SpO2 97%   BMI 19.66 kg/m       Vitals Filed         11/28/2022  1122 11/28/2022  1123          BP: 158/82 162/88      Pulse: 76 --              Constitutional: She is oriented to person, place, and time.  She appears well-developed and well-nourished.   HEENT:  Normocephalic, atraumatic.  Nose clear with drainage.  Normal appearing nasal mucosa.  Mucous membranes moist without lesions.   Neck: Supple. Trachea midline.  Cardiovascular: Normal rate and rhythm without murmurs, rubs or gallops.  No JVD.  No bruits.  No peripheral edema is noted.  Pulmonary/Chest: Lungs clear without wheezes, rales or rhonchi.  No chest wall tenderness or deformity.   Abdominal: Soft. Nontender, nondistended. Rectal exam deferred.  Musculoskeletal: Full and painless ROM in all joint groups.  Neurological: Alert and oriented x 4.  BUE's /BLE's.  Strength and sensation intact in all extremeties.   Skin: Skin is warm and dry. No rash or suspicious skin lesions noted.  Psychiatric:   Normal mood and affect. Speech is normal and behavior is normal. Judgment and thought content normal. Cognition and memory are normal.  Vascular: Peripheral lower extremity pulses well felt.    ASSESSMENT:  ENCOUNTER DIAGNOSES     ICD-10-CM   1. CAD S/P percutaneous coronary angioplasty  I25.10    Z98.61   2. History of cardiac pacemaker  Z95.0   3. Paroxysmal atrial fibrillation (CMS HCC)  I48.0   4. Encounter for monitoring amiodarone therapy  Z51.81    Z79.899   5. History of CVA (cerebrovascular accident)  Z86.73   6. Hypertension, unspecified type  I10   7. Mixed hyperlipidemia  E78.2   8. Dyspnea, unspecified type  R06.00   9. Frequent falls  R29.6   10. Chronic obstructive pulmonary disease, unspecified COPD type (CMS HCC)  J44.9       PLAN:    History of permanent pacemaker placement:  The patient is following with our pacemaker clinic.  Recent device interrogation from January 2024 revealed all battery and lead testing within normal limits.  She had 10 detected AMS episodes the longest lasting 1 hour.  AT/AF burden less than 1%.  Remaining battery longevity 3.6 years.  Continue to follow with device checks as scheduled.    History of atrial fibrillation:  The patient continues on amiodarone 200 mg daily for rhythm control, recommended to increase dose from 100 mg daily by Dr. Marlene Bast after reviewing recent device interrogation and after patient had essentially unremarkable PFTs.  Pulmonary function testing 05/2022 revealed mild obstructive ventilatory defect.  Given patient's age, however, this could be a normal pulmonary function test.  Clinical correlation indicated.  Patient is clinically in sinus rhythm upon auscultation.  EKG performed at [facility] on 10/19/2022 revealed atrial paced rhythm with prolonged AV conduction, when compared to EKG from 2023 no significant change was found.  Heart rate of 70 beats per minute with a QTC of 466.  She remains off of anticoagulation due to high fall risk.   Instructed her to report new or worsening palpitations to the office.  She will continue to have lab work performed by her primary care provider.  Patient and family continue to defer Watchman procedure at this time.  Instructed them to contact the office if they wish to proceed or would be interested in  obtaining more information.    Coronary artery disease:  The patient is status post percutaneous intervention in the past.  She will continue with risk factor modification and medical therapy.  She has not having any anginal symptoms at this time.  Instructed patient to present to the ED if she experiences new or worsening chest pain, shortness breath, palpitations, passing out, or stroke-like symptoms.    Hypertension:  Blood pressure is elevated on today's exam however upon reviewing home blood pressure logs family reports it usually ranges 120-160/70-80.  Strongly recommended to adjust antihypertensive therapy for a goal blood pressure of 100-130/60-80, however patient and her daughter, who was present with her in the room, are not in agreeance at this time.  They report her blood pressure "bottoms out" when it is below 120 systolic.  Strongly instructed patient to monitor blood pressure daily and contact the office if it remains persistently elevated.  Instructed patient once again her goal blood pressure is less than 130/80 but greater than 100 systolic.    Hyperlipidemia:  The patient's last LDL was 70.  Goal LDL less than 70, ideally less than 55.  Recommended she continue Lipitor 20 mg daily.    History of CVA:  Patient and daughter deny reoccurrence of stroke-like symptoms such as one-sided weakness, facial drooping, or slurred speech.  We will continue to monitor.      Orders Placed This Encounter    amiodarone (PACERONE) 200 mg Oral Tablet       Return for Johnmark Geiger. for continued management of chronic health issues including coronary artery disease, history of pacemaker placement, atrial fibrillation, and  hypertension.    Electronically signed by     Dorene Sorrow, PA-C  11/28/2022, 08:45  Note reviewed and I agree with the above findings and assessment and plan by the nurse practitioner/PA.

## 2022-11-28 NOTE — Patient Instructions (Addendum)
Check blood pressure 2 hours after taking medication in the morning and after sitting down for 10 minutes - contact the office if blood pressure remains consistently high or low [>130/80 or <100/60]     Continue same medications    Goal BP 100/60 to 130/80    Goal heart rate 60 to 100    Goal LDL less than 70    Take nitroglycerin as needed for chest pain and go to the ED for chest pain unrelieved with 3 sublingual nitroglycerin tablets    Call or go to the ED for chest pain, shortness of breath, or palpitations    Report or go to the ED for stroke-like symptoms

## 2022-12-15 ENCOUNTER — Other Ambulatory Visit (INDEPENDENT_AMBULATORY_CARE_PROVIDER_SITE_OTHER): Payer: Self-pay | Admitting: Internal Medicine

## 2022-12-17 MED ORDER — METOPROLOL SUCCINATE ER 100 MG TABLET,EXTENDED RELEASE 24 HR
100.0000 mg | ORAL_TABLET | Freq: Every day | ORAL | 0 refills | Status: DC
Start: 2022-12-17 — End: 2023-01-31

## 2022-12-17 MED ORDER — SERTRALINE 25 MG TABLET
12.5000 mg | ORAL_TABLET | Freq: Every day | ORAL | 0 refills | Status: DC
Start: 2022-12-17 — End: 2023-01-31

## 2022-12-17 MED ORDER — FOLIC ACID 1 MG TABLET
1.0000 mg | ORAL_TABLET | Freq: Every day | ORAL | 0 refills | Status: DC
Start: 2022-12-17 — End: 2023-01-31

## 2022-12-19 ENCOUNTER — Telehealth (INDEPENDENT_AMBULATORY_CARE_PROVIDER_SITE_OTHER): Payer: Self-pay | Admitting: Internal Medicine

## 2022-12-19 NOTE — Telephone Encounter (Signed)
Send to the walk-in clinic if it is urgent or we can work it in after I get back from vacation.     Theresa Herford Hill-Reinert, DO

## 2022-12-20 ENCOUNTER — Emergency Department (HOSPITAL_COMMUNITY): Payer: Medicare Other

## 2022-12-20 ENCOUNTER — Other Ambulatory Visit: Payer: Self-pay

## 2022-12-20 ENCOUNTER — Encounter (HOSPITAL_COMMUNITY): Payer: Self-pay

## 2022-12-20 ENCOUNTER — Emergency Department
Admission: EM | Admit: 2022-12-20 | Discharge: 2022-12-20 | Disposition: A | Discharge status: Home / Self Care | Payer: Medicare Other | Attending: Emergency Medicine | Admitting: Emergency Medicine

## 2022-12-20 DIAGNOSIS — M25473 Effusion, unspecified ankle: Secondary | ICD-10-CM

## 2022-12-20 DIAGNOSIS — Z79899 Other long term (current) drug therapy: Secondary | ICD-10-CM | POA: Insufficient documentation

## 2022-12-20 DIAGNOSIS — I10 Essential (primary) hypertension: Secondary | ICD-10-CM | POA: Insufficient documentation

## 2022-12-20 DIAGNOSIS — M7989 Other specified soft tissue disorders: Secondary | ICD-10-CM

## 2022-12-20 DIAGNOSIS — M25472 Effusion, left ankle: Secondary | ICD-10-CM | POA: Insufficient documentation

## 2022-12-20 MED ORDER — AMLODIPINE 5 MG TABLET
5.0000 mg | ORAL_TABLET | ORAL | Status: AC
Start: 2022-12-20 — End: 2022-12-20
  Administered 2022-12-20: 5 mg via ORAL
  Filled 2022-12-20: qty 1

## 2022-12-20 MED ORDER — TRAMADOL 50 MG TABLET
50.0000 mg | ORAL_TABLET | ORAL | Status: AC
Start: 2022-12-20 — End: 2022-12-20
  Administered 2022-12-20: 50 mg via ORAL
  Filled 2022-12-20: qty 1

## 2022-12-20 MED ORDER — CLONIDINE HCL 0.1 MG TABLET
0.1000 mg | ORAL_TABLET | ORAL | Status: AC
Start: 2022-12-20 — End: 2022-12-20
  Administered 2022-12-20: 0.1 mg via ORAL
  Filled 2022-12-20: qty 1

## 2022-12-20 MED ORDER — TRAMADOL 50 MG TABLET
1.0000 | ORAL_TABLET | Freq: Three times a day (TID) | ORAL | 0 refills | Status: DC | PRN
Start: 2022-12-20 — End: 2023-01-24

## 2022-12-20 MED ORDER — LOSARTAN 50 MG TABLET
50.0000 mg | ORAL_TABLET | ORAL | Status: AC
Start: 2022-12-20 — End: 2022-12-20
  Administered 2022-12-20: 50 mg via ORAL
  Filled 2022-12-20: qty 1

## 2022-12-20 NOTE — ED Provider Notes (Signed)
Department of Emergency Medicine  [facility]  12/20/2022            Patient is a 88 y.o. female presenting to the ED with chief complaint of Ankle swelling  Patient is a 88 year old female who presents with left ankle swelling for 3 weeks      Past Medical History:   Diagnosis Date    Arthritis     Asthma     Back problem     CAD S/P percutaneous coronary angioplasty 2010    Pt reports 5 years ago    Charcot's joint of foot, left 08/06/2014    Colon polyp     CVA (cerebrovascular accident) (CMS HCC) 1995    no residual deficits    Deaf     Diverticulitis of colon     Gastroesophageal reflux disease 08/06/2014    GI bleed     H/O hearing loss     History of cardiac pacemaker 08/06/2014    HTN (hypertension)     Hyperlipidemia 12/25/2019    Muscle weakness     Neuropathy (CMS HCC)     Pacemaker     Paroxysmal atrial fibrillation (CMS HCC) 08/06/2014    PUD (peptic ulcer disease) 08/06/2014    C/b GIB Spring 2015    Rheumatoid arthritis (CMS HCC) 08/06/2014    Stage 3 chronic kidney disease (CMS HCC) 12/25/2019    Last Assessment & Plan:  Renal function stable, at baseline Stable electrolytes; restart sodium bicarb 650 mg BID  Continue renal protective ARB Discussed CKD risk factor modifications, including tight BP/glucose control, low protein diet, and avoidance of NSAIDs    Stented coronary artery     Vitamin D deficiency 12/25/2019    Wears glasses      Past Surgical History:   Procedure Laterality Date    Ankle surgery Left     Cataract extraction      Coronary artery angioplasty  2011    Esophagogastroduodenoscopy      Hip surgery Right     Hx back surgery      Hx colonoscopy      Hx hip replacement Left     Hx nissen fundoplication  2002    Hx open cholecystectomy  1962    Hx pacemaker insertion  2019    Hx shoulder surgery Bilateral     Knee arthroplasty Bilateral     Orif distal femur fracture Right     Other surgical history Right     Pancreatic pseudocyst drainage  1971    Total abdominal  hysterectomy       Allergies   Allergen Reactions    Iodinated Contrast Media Anaphylaxis    Iv Contrast Anaphylaxis     makese her serverly sick    Etodolac NO Steroids unless approved by Attending Physician    Symbicort [Budesonide-Formoterol]     Codeine  Other Adverse Reaction (Add comment) and Nausea/ Vomiting     Stomach pain  Stomach pain        Current Outpatient Medications   Medication Sig    acetaminophen (TYLENOL ARTHRITIS PAIN) 650 mg Oral Tablet Sustained Release Take 2 Tablets (1,300 mg total) by mouth Twice per day as needed for Pain    amiodarone (PACERONE) 200 mg Oral Tablet Take 1 Tablet (200 mg total) by mouth Once a day    amLODIPine (NORVASC) 5 mg Oral Tablet Take 1 Tablet (5 mg total) by mouth Once a day    aspirin 81  mg Oral Tablet, Chewable Chew 1 Tablet (81 mg total) Once a day    atorvastatin (LIPITOR) 20 mg Oral Tablet Take 1 Tablet (20 mg total) by mouth Every evening    calcitrioL (ROCALTROL) 0.5 mcg Oral Capsule Take 1 Capsule (0.5 mcg total) by mouth Once a day for 360 days    cephalexin (KEFLEX) 250 mg Oral Capsule Take 1 Capsule (250 mg total) by mouth Once a day    Dexlansoprazole (DEXILANT) 60 mg Oral Cap, Delayed Rel., Multiphasic TAKE 1 CAPSULE BY MOUTH ONCE DAILY    ergocalciferol, vitamin D2, (DRISDOL) 1,250 mcg (50,000 unit) Oral Capsule Take 1 Capsule (50,000 Units total) by mouth Every MON and FRI for 336 days Takes on Fridays    folic acid (FOLVITE) 1 mg Oral Tablet Take 1 Tablet (1 mg total) by mouth Once a day for 90 days    gabapentin (NEURONTIN) 100 mg Oral Capsule Take 1 Capsule (100 mg total) by mouth Twice daily    gemfibroziL (LOPID) 600 mg Oral Tablet Take 1 Tablet (600 mg total) by mouth Once a day for 360 days    lipase-protease-amylase 5,000-17,000- 24,000 unit Oral Capsule, Delayed Release(E.C.) Take 1 Capsule by mouth Three times daily with meals for 360 days    losartan (COZAAR) 50 mg Oral Tablet Take 1 Tablet (50 mg total) by mouth Twice daily    LYSINE  ORAL Take 100 mg by mouth Once per day as needed    melatonin 10 mg Oral Tablet Take 1 Tablet (10 mg total) by mouth Every night    metoprolol succinate (TOPROL-XL) 100 mg Oral Tablet Sustained Release 24 hr Take 1 Tablet (100 mg total) by mouth Once a day    MYRBETRIQ 50 mg Oral Tablet Sustained Release 24 hr Take 1 Tablet (50 mg total) by mouth Once a day    nitroGLYCERIN (NITROSTAT) 0.4 mg Sublingual Tablet, Sublingual Place 1 Tablet (0.4 mg total) under the tongue Every 5 minutes as needed for Chest pain (after three doses, call ER if pain still persists) for 3 doses over 15 minutes    sertraline (ZOLOFT) 25 mg Oral Tablet Take 0.5 Tablets (12.5 mg total) by mouth Once a day for 90 days    sodium bicarbonate 650 mg Oral Tablet Take 1 Tablet (650 mg total) by mouth Twice daily    traMADoL (ULTRAM) 50 mg Oral Tablet Take 1 Tablet (50 mg total) by mouth Three times a day as needed for Pain      Above history reviewed with patient.  Previous records also reviewed.     Review of Systems   Musculoskeletal:  Positive for arthralgias and joint swelling.   All other systems reviewed and are negative.      All other systems reviewed and are negative, unless commented on in the HPI.     Filed Vitals:    12/20/22 1830 12/20/22 1845 12/20/22 1915 12/20/22 1916   BP: (!) 185/89   (!) 168/70   Pulse:       Resp:       Temp:       SpO2: 95% 96% 95%        Physical Exam  Vitals and nursing note reviewed.   Constitutional:       Appearance: Normal appearance.   Cardiovascular:      Pulses: Normal pulses.   Pulmonary:      Effort: Pulmonary effort is normal.   Musculoskeletal:         General:  Swelling (Swelling of the left ankle approximately 1/3 up the leg from the ankle joint.) present.   Skin:     General: Skin is warm and dry.   Neurological:      Mental Status: She is alert and oriented to person, place, and time.          Procedures       Workup:     No results found for this or any previous visit (from the past 24  hour(s)).    XR ANKLE LEFT   Final Result by Edi, Radresults In (05/09 1724)      Stable postoperative appearance without hardware complication         Radiologist location ID: [id number]             Orders Placed This Encounter    XR ANKLE LEFT    amLODIPine (NORVASC) tablet    cloNIDine (CATAPRES) tablet    traMADol (ULTRAM) tablet    losartan (COZAAR) tablet    traMADoL (ULTRAM) 50 mg Oral Tablet       ECG:     Abnormal Lab results:  Labs Ordered/Reviewed - No data to display    ED Course:  Appropriate labs and imaging ordered. Medical Records reviewed.   During the patient's stay in the emergency department, the above listed imaging and/or labs were performed to assist with medical decision making and were reviewed by myself when available for review.   Medical Decision Making  The patient is 88 year old female presents with left ankle pain and swelling for the past 3 weeks.  The patient has not seen her primary care provider for it.  The patient is also has high blood pressure but did not take her afternoon medications today.  On exam the patient does have some tenderness and mild diffuse swelling of the left ankle without obvious deformity.  The patient has a previous traumatic fracture with open reduction internal fixation approximately 25 years ago.  Patient denies any new acute injury.  Pulses are excellent in the bilateral ankles.  There is some swelling without evidence of erythema.  We will do a ankle x-ray.  The ankle x-ray demonstrated no acute fracture or other abnormality.  I will advise the patient to keep the ankle elevated and we will rapid in an Ace wrap.    Amount and/or Complexity of Data Reviewed  Radiology: ordered.     Details: No acute fracture.    Risk  Prescription drug management.           Pt remained stable throughout the emergency department course.      Impression:    Clinical Impression   Ankle swelling (Primary)   Uncontrolled hypertension      Disposition:  Discharged     Follow  Up Plan:  Hill-Reinert, [place], DO  [address]  [place] [address]  [phone]    Call   as needed for ER follow up

## 2022-12-20 NOTE — ED Triage Notes (Signed)
Lt foot pain and swelling that started in the ankle and is now going up her leg. Started 3 wks ago and has gotten worse.

## 2022-12-20 NOTE — ED Nurses Note (Signed)
Patient discharged home with family.  AVS reviewed with patient/care giver.  A written copy of the AVS and discharge instructions was given to the patient/care giver.  Questions sufficiently answered as needed.  Patient/care giver encouraged to follow up with PCP as indicated.  In the event of an emergency, patient/care giver instructed to call 911 or go to the nearest emergency room.

## 2022-12-21 ENCOUNTER — Telehealth (HOSPITAL_COMMUNITY): Payer: Self-pay | Admitting: Internal Medicine

## 2022-12-21 ENCOUNTER — Telehealth (INDEPENDENT_AMBULATORY_CARE_PROVIDER_SITE_OTHER): Payer: Self-pay | Admitting: Internal Medicine

## 2022-12-21 NOTE — Progress Notes (Signed)
12/21/22-1126-   Post Ed Follow-Up    Post ED Follow-Up:   Document completed and/or attempted interactive contact(s) after transition to home after emergency department stay.:   Transition Facility and relevant Date:   Discharge Date: 12/20/22  Discharge from [facility] Emergency Department?: Yes  Discharge Facility: [facility]  Contacted by: Lavella Lemons  Contact method: Patient/Caregiver Telephone  Contact completed: 12/21/2022 11:23 AM  MyChart message sent?: No  Was the AVS reviewed with patient?: Yes  How is the patient recovering?: Improving  Medications prescribed: Yes  Were they obtained?: No  Barriers to obtaining meds: has not been to pharmacy yet (Comment: As per patient is currently taking tylenol as directed)  Interventions: Provided patient education  12/21/22-1124- Spoke with patient who appeared alert and appropriate during our conversation advising she is doing " alright" and elevating her BLE prn. Patient advising she has transportation to her appts. Lavella Lemons RN BSN [employer] Nurse Navigator

## 2022-12-27 ENCOUNTER — Other Ambulatory Visit (HOSPITAL_COMMUNITY): Payer: Self-pay | Admitting: RHEUMATOLOGY

## 2022-12-27 DIAGNOSIS — M7989 Other specified soft tissue disorders: Secondary | ICD-10-CM

## 2022-12-27 DIAGNOSIS — M154 Erosive (osteo)arthritis: Secondary | ICD-10-CM

## 2022-12-27 DIAGNOSIS — Z79631 Long term (current) use of antimetabolite agent: Secondary | ICD-10-CM

## 2022-12-28 ENCOUNTER — Inpatient Hospital Stay
Admission: RE | Admit: 2022-12-28 | Discharge: 2022-12-28 | Disposition: A | Discharge status: Home / Self Care | Payer: Medicare Other | Source: Ambulatory Visit | Attending: RHEUMATOLOGY | Admitting: RHEUMATOLOGY

## 2022-12-28 ENCOUNTER — Other Ambulatory Visit: Payer: Self-pay

## 2022-12-28 DIAGNOSIS — M7989 Other specified soft tissue disorders: Secondary | ICD-10-CM

## 2023-01-08 ENCOUNTER — Encounter (INDEPENDENT_AMBULATORY_CARE_PROVIDER_SITE_OTHER): Payer: Self-pay | Admitting: Orthopaedic Surgery

## 2023-01-08 ENCOUNTER — Ambulatory Visit: Payer: Medicare Other | Attending: Orthopaedic Surgery | Admitting: Orthopaedic Surgery

## 2023-01-08 ENCOUNTER — Encounter: Payer: Self-pay | Admitting: RHEUMATOLOGY

## 2023-01-08 ENCOUNTER — Other Ambulatory Visit (INDEPENDENT_AMBULATORY_CARE_PROVIDER_SITE_OTHER): Payer: Self-pay | Admitting: Internal Medicine

## 2023-01-08 ENCOUNTER — Other Ambulatory Visit: Payer: Self-pay

## 2023-01-08 VITALS — BP 167/99 | HR 70 | Temp 97.2°F | Resp 19 | Ht 63.0 in | Wt 110.0 lb

## 2023-01-08 DIAGNOSIS — M25512 Pain in left shoulder: Secondary | ICD-10-CM | POA: Insufficient documentation

## 2023-01-08 DIAGNOSIS — G8929 Other chronic pain: Secondary | ICD-10-CM

## 2023-01-08 DIAGNOSIS — M25511 Pain in right shoulder: Secondary | ICD-10-CM | POA: Insufficient documentation

## 2023-01-08 DIAGNOSIS — M25552 Pain in left hip: Secondary | ICD-10-CM | POA: Insufficient documentation

## 2023-01-08 DIAGNOSIS — M25551 Pain in right hip: Secondary | ICD-10-CM | POA: Insufficient documentation

## 2023-01-08 DIAGNOSIS — M12811 Other specific arthropathies, not elsewhere classified, right shoulder: Secondary | ICD-10-CM | POA: Insufficient documentation

## 2023-01-08 DIAGNOSIS — M12812 Other specific arthropathies, not elsewhere classified, left shoulder: Secondary | ICD-10-CM | POA: Insufficient documentation

## 2023-01-08 DIAGNOSIS — M545 Low back pain, unspecified: Secondary | ICD-10-CM | POA: Insufficient documentation

## 2023-01-08 MED ORDER — TRIAMCINOLONE ACETONIDE 40 MG/ML SUSPENSION FOR INJECTION
40.0000 mg | INTRAMUSCULAR | Status: AC
Start: 2023-01-08 — End: 2023-01-08
  Administered 2023-01-08: 40 mg via INTRA_ARTICULAR

## 2023-01-08 MED ORDER — LIDOCAINE HCL 10 MG/ML (1 %) INJECTION SOLUTION
4.0000 mL | Freq: Once | INTRAMUSCULAR | Status: AC
Start: 2023-01-08 — End: 2023-01-08
  Administered 2023-01-08: 40 mg via INTRAMUSCULAR

## 2023-01-08 NOTE — Nursing Note (Signed)
Travel Screening       Question Response    Have you been in contact with someone who was sick? --    Do you have any of the following new or worsening symptoms? None of these    Have you traveled internationally in the last month? No          Travel History   Travel since 12/09/22    No documented travel since 12/09/22       Kamilah Correia, LPN

## 2023-01-10 ENCOUNTER — Encounter (HOSPITAL_BASED_OUTPATIENT_CLINIC_OR_DEPARTMENT_OTHER): Payer: Self-pay | Admitting: Pain Medicine

## 2023-01-10 ENCOUNTER — Other Ambulatory Visit: Payer: Self-pay

## 2023-01-10 ENCOUNTER — Ambulatory Visit: Payer: Medicare Other | Attending: Pain Medicine | Admitting: Pain Medicine

## 2023-01-10 ENCOUNTER — Encounter (INDEPENDENT_AMBULATORY_CARE_PROVIDER_SITE_OTHER): Payer: Self-pay | Admitting: Internal Medicine

## 2023-01-10 ENCOUNTER — Other Ambulatory Visit (INDEPENDENT_AMBULATORY_CARE_PROVIDER_SITE_OTHER): Payer: Self-pay | Admitting: Internal Medicine

## 2023-01-10 VITALS — BP 170/84 | HR 70 | Temp 98.6°F | Resp 22 | Ht 63.0 in | Wt 110.0 lb

## 2023-01-10 DIAGNOSIS — M5136 Other intervertebral disc degeneration, lumbar region: Secondary | ICD-10-CM | POA: Insufficient documentation

## 2023-01-10 DIAGNOSIS — G894 Chronic pain syndrome: Secondary | ICD-10-CM | POA: Insufficient documentation

## 2023-01-10 DIAGNOSIS — M5116 Intervertebral disc disorders with radiculopathy, lumbar region: Secondary | ICD-10-CM | POA: Insufficient documentation

## 2023-01-10 DIAGNOSIS — M25551 Pain in right hip: Secondary | ICD-10-CM

## 2023-01-10 DIAGNOSIS — M5416 Radiculopathy, lumbar region: Secondary | ICD-10-CM | POA: Insufficient documentation

## 2023-01-10 DIAGNOSIS — M4726 Other spondylosis with radiculopathy, lumbar region: Secondary | ICD-10-CM | POA: Insufficient documentation

## 2023-01-10 DIAGNOSIS — M545 Low back pain, unspecified: Secondary | ICD-10-CM

## 2023-01-10 DIAGNOSIS — M48061 Spinal stenosis, lumbar region without neurogenic claudication: Secondary | ICD-10-CM | POA: Insufficient documentation

## 2023-01-10 DIAGNOSIS — M438X4 Other specified deforming dorsopathies, thoracic region: Secondary | ICD-10-CM | POA: Insufficient documentation

## 2023-01-10 DIAGNOSIS — M47816 Spondylosis without myelopathy or radiculopathy, lumbar region: Secondary | ICD-10-CM | POA: Insufficient documentation

## 2023-01-10 NOTE — Nursing Note (Signed)
01/10/23 1110   [facility]   Location BACK   Pain start date   (c/o pain for seveal years.  Has had several years.)   Pain: (from 0 - 10) 9   Worst pain rating  9   Number of worst pain incidents in last week 5-6   Words to describe Aching;Burning;Stabbing   Timing of pain Constant   What exacerbates pain Standing;Lifting;Walking;Exercise;Stair Climbing   Past Pain Medication NSAIDs;Muscle Relaxers

## 2023-01-10 NOTE — Nursing Note (Signed)
01/10/23 1111   [facility]   Location ANKLE  (left ankle)   How did pain start Patient has a previous injury along with surgery.   Pain: (from 0 - 10) 8   Worst pain rating  8   Number of worst pain incidents in last week 5-6   Words to describe Aching;Burning;Stabbing   Timing of pain Constant

## 2023-01-10 NOTE — H&P (Signed)
PAIN MANAGEMENT, [facility]  [address]  [place] [address]  Operated by [facility]  History and Physical    Name: Theresa Greene MRN:  [id number]   Date: 01/10/2023 DOB:  01-09-1935 (88 y.o.)            New Patient    Reason for Visit: Low Back Pain and Ankle Pain    History of Present Illness  Theresa Greene is a 88 y.o. female presenting for initial evaluation of pain. Pain is located in the low back and into the legs.  The pain is described as aching and weakness.  She also says the pain can shoot into her right leg.  The pain is constant but worse with activity.  The pain travels into her legs in an L5 dermatomal distribution.    Location: Ankle (left ankle)  Pain start date:  (c/o pain for seveal years.  Has had several years.)  How did pain start: Patient has a previous injury along with surgery.  Worst pain rating : 8  Number of worst pain incidents in last week: 5-6  Words to describe: Aching, Burning, Stabbing  Timing of pain: Constant  What exacerbates pain: Standing, Lifting, Walking, Exercise, Stair Climbing  Past Pain Medication: NSAIDs, Muscle Relaxers         At this time the patients pain has begun to interfere with their quality of life and daily functionality.  They have failed at least 6 weeks of conservative therapy that include: OTC medications, prescription medications, physician directed home exercises and stretches all within a 3 month period.  They are here today to review their interventional treatment options for their worsening chronic pain.     Past Medical History:   Diagnosis Date    Arthritis     Asthma     Back problem     CAD S/P percutaneous coronary angioplasty 2010    Pt reports 5 years ago    Charcot's joint of foot, left 08/06/2014    Colon polyp     CVA (cerebrovascular accident) (CMS HCC) 1995    no residual deficits    Deaf     Diverticulitis of colon     Gastroesophageal reflux disease 08/06/2014    GI bleed     H/O hearing  loss     History of cardiac pacemaker 08/06/2014    HTN (hypertension)     Hyperlipidemia 12/25/2019    Muscle weakness     Neuropathy (CMS HCC)     Pacemaker     Paroxysmal atrial fibrillation (CMS HCC) 08/06/2014    PUD (peptic ulcer disease) 08/06/2014    C/b GIB Spring 2015    Rheumatoid arthritis (CMS HCC) 08/06/2014    Stage 3 chronic kidney disease (CMS HCC) 12/25/2019    Last Assessment & Plan:  Renal function stable, at baseline Stable electrolytes; restart sodium bicarb 650 mg BID  Continue renal protective ARB Discussed CKD risk factor modifications, including tight BP/glucose control, low protein diet, and avoidance of NSAIDs    Stented coronary artery     Vitamin D deficiency 12/25/2019    Wears glasses          Past Surgical History:   Procedure Laterality Date    ANKLE SURGERY Left     repair with metal    CATARACT EXTRACTION      unsure which eye but thinks she only had one eye done    CORONARY ARTERY ANGIOPLASTY  2011  ESOPHAGOGASTRODUODENOSCOPY      HIP SURGERY Right     repair with metal    HX BACK SURGERY      lower back x 3 no metal    HX COLONOSCOPY      HX HIP REPLACEMENT Left     HX NISSEN FUNDOPLICATION  2002    then three others to repair problems    HX OPEN CHOLECYSTECTOMY  1962    with common duxt exploration    HX PACEMAKER INSERTION  2019    HX SHOULDER SURGERY Bilateral     scopes    KNEE ARTHROPLASTY Bilateral     ORIF DISTAL FEMUR FRACTURE Right     OTHER SURGICAL HISTORY Right     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    PANCREATIC PSEUDOCYST DRAINAGE  1971    TOTAL ABDOMINAL HYSTERECTOMY           Current Outpatient Medications   Medication Sig    acetaminophen (TYLENOL ARTHRITIS PAIN) 650 mg Oral Tablet Sustained Release Take 2 Tablets (1,300 mg total) by mouth Twice per day as needed for Pain    amiodarone (PACERONE) 200 mg Oral Tablet Take 1 Tablet (200 mg total) by mouth Once a day    amLODIPine (NORVASC) 5 mg Oral Tablet Take 1 Tablet (5 mg total) by mouth  Once a day    aspirin 81 mg Oral Tablet, Chewable Chew 1 Tablet (81 mg total) Once a day    atorvastatin (LIPITOR) 20 mg Oral Tablet TAKE ONE TABLET BY MOUTH DAILY    calcitrioL (ROCALTROL) 0.5 mcg Oral Capsule Take 1 Capsule (0.5 mcg total) by mouth Once a day for 360 days    Dexlansoprazole (DEXILANT) 60 mg Oral Cap, Delayed Rel., Multiphasic TAKE 1 CAPSULE BY MOUTH ONCE DAILY    ergocalciferol, vitamin D2, (DRISDOL) 1,250 mcg (50,000 unit) Oral Capsule Take 1 Capsule (50,000 Units total) by mouth Every MON and FRI for 336 days Takes on Fridays    folic acid (FOLVITE) 1 mg Oral Tablet Take 1 Tablet (1 mg total) by mouth Once a day for 90 days    gabapentin (NEURONTIN) 100 mg Oral Capsule Take 1 Capsule (100 mg total) by mouth Twice daily    gemfibroziL (LOPID) 600 mg Oral Tablet Take 1 Tablet (600 mg total) by mouth Once a day for 360 days    lipase-protease-amylase 5,000-17,000- 24,000 unit Oral Capsule, Delayed Release(E.C.) Take 1 Capsule by mouth Three times daily with meals for 360 days    losartan (COZAAR) 50 mg Oral Tablet Take 1 Tablet (50 mg total) by mouth Twice daily    LYSINE ORAL Take 100 mg by mouth Once per day as needed    melatonin 10 mg Oral Tablet Take 1 Tablet (10 mg total) by mouth Every night    metoprolol succinate (TOPROL-XL) 100 mg Oral Tablet Sustained Release 24 hr Take 1 Tablet (100 mg total) by mouth Once a day    MYRBETRIQ 50 mg Oral Tablet Sustained Release 24 hr Take 1 Tablet (50 mg total) by mouth Once a day    nitroGLYCERIN (NITROSTAT) 0.4 mg Sublingual Tablet, Sublingual Place 1 Tablet (0.4 mg total) under the tongue Every 5 minutes as needed for Chest pain (after three doses, call ER if pain still persists) for 3 doses over 15 minutes    sertraline (ZOLOFT) 25 mg Oral Tablet Take 0.5 Tablets (12.5 mg total) by mouth Once a day for 90 days  sodium bicarbonate 650 mg Oral Tablet Take 1 Tablet (650 mg total) by mouth Twice daily    traMADoL (ULTRAM) 50 mg Oral Tablet Take 1  Tablet (50 mg total) by mouth Three times a day as needed for Pain     Allergies   Allergen Reactions    Iodinated Contrast Media Anaphylaxis    Iv Contrast Anaphylaxis     makese her serverly sick    Etodolac NO Steroids unless approved by Attending Physician    Symbicort [Budesonide-Formoterol]     Codeine  Other Adverse Reaction (Add comment) and Nausea/ Vomiting     Stomach pain  Stomach pain       Family Medical History:       Problem Relation (Age of Onset)    Brain cancer Mother    Ovarian Cancer Mother    Pancreatic Cancer Father            Social History     Tobacco Use    Smoking status: Never    Smokeless tobacco: Never   Vaping Use    Vaping status: Never Used   Substance Use Topics    Alcohol use: Never    Drug use: Never       Nurse Notes:  Nursing Notes:   Baldwin Jamaica, LPN  16/10/96 0454  Signed     01/10/23 1110   [facility]   Location BACK   Pain start date   (c/o pain for seveal years.  Has had several years.)   Pain: (from 0 - 10) 9   Worst pain rating  9   Number of worst pain incidents in last week 5-6   Words to describe Aching;Burning;Stabbing   Timing of pain Constant   What exacerbates pain Standing;Lifting;Walking;Exercise;Stair Climbing   Past Pain Medication NSAIDs;Muscle Relaxers         Baldwin Jamaica, LPN  09/81/19 1478  Signed     01/10/23 1111   [facility]   Location ANKLE  (left ankle)   How did pain start Patient has a previous injury along with surgery.   Pain: (from 0 - 10) 8   Worst pain rating  8   Number of worst pain incidents in last week 5-6   Words to describe Aching;Burning;Stabbing   Timing of pain Constant          Physical Exam:  Vitals:    01/10/23 1107   BP: (!) 170/84   Pulse: 70   Resp: (!) 22   Temp: 37 C (98.6 F)   TempSrc: Temporal   SpO2: 97%   Weight: 49.9 kg (110 lb)   Height: 1.6 m (5\' 3" )   BMI: 19.53          No results found for this or any previous visit (from the past 295621308 hour(s)).      No results found for this or any  previous visit (from the past [id number] hour(s)).    Physical Exam  General:     Alert and oriented  Musculoskeletal Spine:      Cervical back: No tenderness present. No deformity.      Thoracic back: No deformity or tenderness.      Lumbar back: Tenderness present. Positive right straight leg raise test and positive left straight leg raise test.   Neurological:      Mental Status: Alert.      Cranial Nerves: Cranial nerves are intact.      Motor: Motor function is  intact.      Gait: ambulates with a walker  Psychiatric:         Behavior: Behavior is cooperative.      Affect: Normal     IMAGING:  Narrative & Impression   Shelah RUTH Klonowski  Female, 88 years old.     CT LUMBAR SPINE WO IV CONTRAST performed on 07/03/2022 5:01 PM.     REASON FOR EXAM:  fall, low back pain     RADIATION DOSE: 405.00 mGycm     TECHNIQUE: Axial images of the lumbar spine, with coned down field of view and multiplanar reconstructions using bone and soft tissue algorithms     COMPARISON: None     FINDINGS: Bone mineral density is preserved.  No acute vertebral body height loss identified. There is chronic T12 inferior endplate compression deformity. There is multilevel degenerative change and scoliotic curvature within the lumbar spine. There is multilevel vertebral body height loss. No definite spinal canal narrowing. There is likely bilateral neural foraminal stenosis from L2 through S1..  The lumbar spinal alignment is preserved.   Pre-vertebral and para-spinal soft tissues are normal.     The included lung bases demonstrate no consolidation.  The included intra-abdominal organs show no acute process.     IMPRESSION:     1. No acute fracture or malalignment.  2. Chronic T12 inferior endplate compression deformity.  3. No spinal canal narrowing. Multilevel neural foraminal stenosis as detailed above.        Radiologist location ID: [id number]          Assessment and Plan    ICD-10-CM    1. Lumbar spondylosis  M47.816       2. DDD  (degenerative disc disease), lumbar  M51.36       3. Lumbar radiculopathy  M54.16       4. Chronic pain syndrome  G89.4            Due to the patients clinical presentation, Physical exam, and history, we will be scheduling a LESI at L5-S1 with the goals of pain relief and increased functionality.  Given the patients history of failed conservative treatment as stated above, it is reasonable to proceed.  Risks were discussed such as bleeding ,infection and failed procedure. Also benefits of the procedure such as pain relief were discussed at length and the patient expressed understanding.   Pending the results with the procedure we will establish an exercise regimen to build strength and conditioning to help with long term pain relief.  Verbal consent to the treatment plan was obtained.  Patient was provided with written education regarding their procedure.  The patient will schedule the procedure at their earliest convenience.      Follow up: Return for In Person Visit, LESI at L5-S1.      Electronically Signed: Karl Bales, MD 01/10/2023, 12:05        This note may have been partially generated using MModal Fluency Direct system, and there may be some incorrect words, spellings, and punctuation that were not noted in checking the note before saving, though effort was made to avoid such errors.

## 2023-01-13 ENCOUNTER — Encounter (INDEPENDENT_AMBULATORY_CARE_PROVIDER_SITE_OTHER): Payer: Self-pay | Admitting: Orthopaedic Surgery

## 2023-01-13 NOTE — Progress Notes (Signed)
[facility]  Department of Orthopedics  [address] [place] [address]  Phone [phone]    Theresa Greene  Date of service: 01/08/2023    Reason for Visit:  Bilateral shoulder cortisone injection    Subjective:  78 old female with history of prior rotator cuff surgery bilateral shoulders and ongoing chronic bilateral shoulder pain with signs of bilateral rotator cuff arthropathy.  The patient was last seen 2-26 at which time she underwent bilateral subacromial cortisone injections by myself and was referred to Dr. Myriam Forehand for possible reverse shoulder replacement.  She reports that after talking further with her daughter who was a Art therapist, her daughter did not believe that she should undergo any further surgery and the patient subsequently did not follow-up with Dr. Myriam Forehand.  She requests repeat cortisone injections today.  She also discusses chronic low back pain as well as bilateral hip pain.  She had previously undergone hip replacement as well as knee replacement surgery elsewhere.  She would be interested in seeing Dr. Daisey Must from interventional pain.  She presents with her husband and using a rolling walker.     Objective:  Vitals:    01/08/23 1047   BP: (!) 167/99   Pulse: 70   Resp: 19   Temp: 36.2 C (97.2 F)   TempSrc: Temporal   SpO2: 97%   Weight: 49.9 kg (110 lb)   Height: 1.6 m (5\' 3" )   BMI: 19.53         Body mass index is 19.49 kg/m.    General:  Alert, oriented, mild distress related to areas of pain.  Shoulders:  No signs of infection, positive diffuse atrophy, difficulty with overhead motion  Neuro:  Grossly intact bilateral upper extremity    Radiographic/other Studies:  Shoulder x-rays reviewed, metallic anchors in place, humeral head elevation with glenohumeral degenerative signs bilaterally    Assessment:    (M12.811,  M12.812) Rotator cuff arthropathy of both shoulders  (primary encounter diagnosis)    (M25.511,  G89.29,  M25.512) Chronic pain of  both shoulders    (M54.50,  G89.29) Chronic low back pain  Plan: Refer to [facility], [facility]    [phone],  M25.552,  G89.29) Chronic hip pain, bilateral  Plan: Refer to [facility], [facility]      Plan:  The patients diagnosis and treatment options, both non-surgical and surgical, were discussed at length.  Risks, benefits, alternatives and potential complications of these were discussed.  Findings of any radiographic studies or laboratory evaluation were discussed with the patient.  The  patient was given the opportunity to ask questions which were  then answered to the best of my ability.  The patient was informed of any follow up care or further studies needed and showed understanding of these.  These actions were discussed with the patient.    Activity/Work Modification:  As tolerated  DME/Cast/Splint:  None  Additional Medical Record needed for review:  Prior ortho if available  Medications:  Reviewed  Physical/Occupational Therapy:  None  Radiographic Studies:  Reviewed  Therapeutic or Diagnostic Injections:  The patient requesting bilateral shoulder cortisone injections  Surgical Intervention:  Patient does not believe she is a candidate for shoulder replacement surgery  Referral:  To Dr. Daisey Must, interventional pain for chronic low back bilateral hip pain   Follow up:  P.r.n. and  with Dr. Daisey Must         Orders Placed This Encounter    Refer to [facility], [facility]    lidocaine 1% injection    triamcinolone acetonide (KENALOG-40) 40 mg/mL injection    lidocaine 1% injection    triamcinolone acetonide (KENALOG-40) 40 mg/mL injection

## 2023-01-13 NOTE — Procedures (Signed)
[facility]  [address]  [place] [address]  Operated by [facility]  Procedure Note    Name: Cattie Ellard MRN:  [id number]   Date: 01/08/2023 DOB:  19-Aug-1934 (87 y.o.)         Joint Asp/Inj    Performed by: Clemon Chambers, MD  Authorized by: Clemon Chambers, MD    Consent:     Consent obtained:  Verbal and written (The patient elected to proceed with a cortisone injection.)    Consent given by:  Patient    Risks discussed:  Bleeding, infection and pain (Risks, benefits, alternatives and potential complications of these were discussed.)    Alternatives discussed:  Alternative treatment and observation  Universal protocol:     Procedure explained and questions answered to patient or proxy's satisfaction: yes      Test results available and properly labeled: yes      Imaging studies available: yes      Immediately prior to procedure, a time out was called: yes      Patient identity confirmed:  Verbally with patient  Location:     Location:  Shoulder    Shoulder joint: Bilateral subacromial.  Anesthesia (see MAR for exact dosages):     Anesthesia method:  Topical application    Topical anesthesia: Ethyl Chloride.  Procedure details:     Preparation: Patient was prepped and draped in usual sterile fashion      Approach:  Posterior    Steroid injected: yes (Kenalog 40mg  1% lidocaine 4mL each shoulder)    Post-procedure details:     Dressing:  Adhesive bandage    Patient tolerance of procedure:  Tolerated well, no immediate complications      Clemon Chambers, MD

## 2023-01-17 ENCOUNTER — Ambulatory Visit (INDEPENDENT_AMBULATORY_CARE_PROVIDER_SITE_OTHER): Payer: Self-pay | Admitting: Internal Medicine

## 2023-01-22 ENCOUNTER — Encounter (HOSPITAL_COMMUNITY): Payer: Self-pay | Admitting: Pain Medicine

## 2023-01-22 ENCOUNTER — Other Ambulatory Visit: Payer: Medicare Other | Attending: Internal Medicine

## 2023-01-22 ENCOUNTER — Other Ambulatory Visit: Payer: Self-pay

## 2023-01-22 DIAGNOSIS — I10 Essential (primary) hypertension: Secondary | ICD-10-CM | POA: Insufficient documentation

## 2023-01-22 DIAGNOSIS — N1832 Chronic kidney disease, stage 3b: Secondary | ICD-10-CM | POA: Insufficient documentation

## 2023-01-22 DIAGNOSIS — I480 Paroxysmal atrial fibrillation: Secondary | ICD-10-CM | POA: Insufficient documentation

## 2023-01-22 LAB — CBC
HCT: 36.1 % (ref 34.8–46.0)
HGB: 11.9 g/dL (ref 11.5–16.0)
MCH: 31.2 pg (ref 26.0–32.0)
MCHC: 33 g/dL (ref 31.0–35.5)
MCV: 94.5 fL (ref 78.0–100.0)
MPV: 9.9 fL (ref 8.7–12.5)
PLATELETS: 423 10*3/uL — ABNORMAL HIGH (ref 150–400)
RBC: 3.82 10*6/uL — ABNORMAL LOW (ref 3.85–5.22)
RDW-CV: 16.6 % — ABNORMAL HIGH (ref 11.5–15.5)
WBC: 9 10*3/uL (ref 3.7–11.0)

## 2023-01-22 LAB — LIPID PANEL
CHOL/HDL RATIO: 2.8
CHOLESTEROL: 146 mg/dL (ref 100–200)
HDL CHOL: 52 mg/dL (ref >=50)
LDL CALC: 75 mg/dL (ref <100)
NON-HDL: 94 mg/dL (ref <=190)
TRIGLYCERIDES: 107 mg/dL (ref <150)
VLDL CALC: 16 mg/dL (ref <30)

## 2023-01-22 LAB — THYROID STIMULATING HORMONE WITH FREE T4 REFLEX: TSH: 1.974 u[IU]/mL (ref 0.350–4.940)

## 2023-01-22 LAB — VITAMIN D 25 TOTAL: VITAMIN D 25, TOTAL: 38.3 ng/mL (ref 30.0–100.0)

## 2023-01-22 LAB — PARATHYROID HORMONE (PTH): PTH: <4 pg/mL — ABNORMAL LOW (ref 8.5–77.0)

## 2023-01-22 LAB — PHOSPHORUS: PHOSPHORUS: 4.3 mg/dL — ABNORMAL HIGH (ref 2.3–4.0)

## 2023-01-23 ENCOUNTER — Telehealth (INDEPENDENT_AMBULATORY_CARE_PROVIDER_SITE_OTHER): Payer: Self-pay | Admitting: Internal Medicine

## 2023-01-23 DIAGNOSIS — N1832 Chronic kidney disease, stage 3b (CMS HCC): Secondary | ICD-10-CM

## 2023-01-23 LAB — COMPREHENSIVE METABOLIC PANEL, NON-FASTING
ALBUMIN: 3.2 g/dL — ABNORMAL LOW (ref 3.4–4.8)
ALKALINE PHOSPHATASE: 115 U/L (ref 55–145)
ALT (SGPT): 10 U/L (ref 8–22)
ANION GAP: 12 mmol/L (ref 4–13)
AST (SGOT): 26 U/L (ref 8–45)
BILIRUBIN TOTAL: 0.4 mg/dL (ref 0.3–1.3)
BUN/CREA RATIO: 19 (ref 6–22)
BUN: 31 mg/dL — ABNORMAL HIGH (ref 8–25)
CALCIUM: 11 mg/dL — ABNORMAL HIGH (ref 8.6–10.3)
CHLORIDE: 102 mmol/L (ref 96–111)
CO2 TOTAL: 25 mmol/L (ref 23–31)
CREATININE: 1.65 mg/dL — ABNORMAL HIGH (ref 0.60–1.05)
ESTIMATED GFR - FEMALE: 30 mL/min/BSA — ABNORMAL LOW (ref >=60)
GLUCOSE: 98 mg/dL (ref 65–125)
POTASSIUM: 4.6 mmol/L (ref 3.5–5.1)
PROTEIN TOTAL: 6.8 g/dL (ref 6.0–8.0)
SODIUM: 139 mmol/L (ref 136–145)

## 2023-01-23 NOTE — Telephone Encounter (Signed)
[name] called to see if you wanted to order a CMP for pt. She said you normally order one every quarter. Her cardio dr ordered one on 10/19/22. She had the labs you ordered completed yesterday so if you do order it, it can be added on.     Nita Sickle  01/23/2023 08:07

## 2023-01-23 NOTE — Telephone Encounter (Signed)
Theresa Greene,   I put in an order for the CMP and noted in the comments to add it to existing specimen but it would not let me add it on like it normally does. Can you call the lab and see if they can add it on and if not let her daughter know that she will need restuck if that is what they desire.    Theresa Bogdanski Hill-Reinert, DO

## 2023-01-23 NOTE — Telephone Encounter (Signed)
I spoke to the lab and they are going to add it on.   Nita Sickle  01/23/2023 08:49

## 2023-01-24 ENCOUNTER — Inpatient Hospital Stay
Admission: RE | Admit: 2023-01-24 | Discharge: 2023-01-24 | Disposition: A | Discharge status: Home / Self Care | Payer: Medicare Other | Source: Ambulatory Visit | Attending: Pain Medicine | Admitting: Pain Medicine

## 2023-01-24 ENCOUNTER — Other Ambulatory Visit: Payer: Self-pay

## 2023-01-24 ENCOUNTER — Encounter (HOSPITAL_COMMUNITY)
Admission: RE | Disposition: A | Discharge status: Home / Self Care | Payer: Self-pay | Source: Ambulatory Visit | Attending: Pain Medicine

## 2023-01-24 ENCOUNTER — Inpatient Hospital Stay (HOSPITAL_COMMUNITY): Payer: Medicare Other

## 2023-01-24 ENCOUNTER — Ambulatory Visit: Payer: Medicare Other | Attending: Internal Medicine | Admitting: Internal Medicine

## 2023-01-24 ENCOUNTER — Encounter (INDEPENDENT_AMBULATORY_CARE_PROVIDER_SITE_OTHER): Payer: Self-pay | Admitting: Internal Medicine

## 2023-01-24 VITALS — BP 110/78 | HR 73 | Temp 97.7°F

## 2023-01-24 DIAGNOSIS — R64 Cachexia: Secondary | ICD-10-CM | POA: Insufficient documentation

## 2023-01-24 DIAGNOSIS — M25572 Pain in left ankle and joints of left foot: Secondary | ICD-10-CM | POA: Insufficient documentation

## 2023-01-24 DIAGNOSIS — Z66 Do not resuscitate: Secondary | ICD-10-CM | POA: Insufficient documentation

## 2023-01-24 DIAGNOSIS — E782 Mixed hyperlipidemia: Secondary | ICD-10-CM | POA: Insufficient documentation

## 2023-01-24 DIAGNOSIS — I739 Peripheral vascular disease, unspecified: Secondary | ICD-10-CM

## 2023-01-24 DIAGNOSIS — I129 Hypertensive chronic kidney disease with stage 1 through stage 4 chronic kidney disease, or unspecified chronic kidney disease: Secondary | ICD-10-CM | POA: Insufficient documentation

## 2023-01-24 DIAGNOSIS — M5116 Intervertebral disc disorders with radiculopathy, lumbar region: Secondary | ICD-10-CM | POA: Insufficient documentation

## 2023-01-24 DIAGNOSIS — G894 Chronic pain syndrome: Secondary | ICD-10-CM | POA: Insufficient documentation

## 2023-01-24 DIAGNOSIS — Z789 Other specified health status: Secondary | ICD-10-CM | POA: Insufficient documentation

## 2023-01-24 DIAGNOSIS — R609 Edema, unspecified: Secondary | ICD-10-CM | POA: Insufficient documentation

## 2023-01-24 DIAGNOSIS — M48061 Spinal stenosis, lumbar region without neurogenic claudication: Secondary | ICD-10-CM | POA: Insufficient documentation

## 2023-01-24 DIAGNOSIS — N1832 Chronic kidney disease, stage 3b: Secondary | ICD-10-CM | POA: Insufficient documentation

## 2023-01-24 DIAGNOSIS — I10 Essential (primary) hypertension: Secondary | ICD-10-CM | POA: Insufficient documentation

## 2023-01-24 DIAGNOSIS — M4726 Other spondylosis with radiculopathy, lumbar region: Secondary | ICD-10-CM | POA: Insufficient documentation

## 2023-01-24 DIAGNOSIS — N2581 Secondary hyperparathyroidism of renal origin: Secondary | ICD-10-CM | POA: Insufficient documentation

## 2023-01-24 DIAGNOSIS — I480 Paroxysmal atrial fibrillation: Secondary | ICD-10-CM | POA: Insufficient documentation

## 2023-01-24 SURGERY — PAIN SERVICE BLOCK INJECTION LUMBAR EPIDURAL
Anesthesia: Local (Nurse-Monitored) | Site: Back | Wound class: Clean Wound: Uninfected operative wounds in which no inflammation occurred

## 2023-01-24 MED ORDER — LIDOCAINE HCL 10 MG/ML (1 %) INJECTION SOLUTION
Freq: Once | INTRAMUSCULAR | Status: DC | PRN
Start: 2023-01-24 — End: 2023-01-24
  Administered 2023-01-24: 3 mL via INTRADERMAL

## 2023-01-24 MED ORDER — BUPIVACAINE (PF) 0.25 % (2.5 MG/ML) INJECTION SOLUTION
Freq: Once | INTRAMUSCULAR | Status: DC | PRN
Start: 2023-01-24 — End: 2023-01-24
  Administered 2023-01-24: 2 mL

## 2023-01-24 MED ORDER — METHYLPREDNISOLONE SOD SUCCINATE 40 MG/ML SOLUTION FOR INJ. WRAPPER
Freq: Once | INTRAMUSCULAR | Status: DC | PRN
Start: 2023-01-24 — End: 2023-01-24
  Administered 2023-01-24: 80 mg via INTRAVENOUS

## 2023-01-24 SURGICAL SUPPLY — 12 items
APPL 70% ISPRP 2% CHG 3ML CHLRPRP PREP STRL LF  CLR (MED SURG SUPPLIES) ×1 IMPLANT
BANDAGE CVRLT 3.5X2IN FLXB NADH PAD RYN COTTON ADH STRL LF (WOUND CARE SUPPLY) ×1 IMPLANT
CONV USE 31829 - NEEDLE HYPO  18GA 1.5IN STD REG BVL LF (MED SURG SUPPLIES) ×1 IMPLANT
CONV USE 48053 - SYRINGE 3ML LF  STRL LL TIP MED (MED SURG SUPPLIES) ×1 IMPLANT
CONV USE 48069 - SYRINGE 5ML LF  STRL LL TIP MED (MED SURG SUPPLIES) ×1 IMPLANT
CONV USE 80461 - NEEDLE HYPO  25GA 1.5IN STD REG BVL LF (MED SURG SUPPLIES) ×1 IMPLANT
CONV USE ITEM 321837 - GLOVE SURG 7.5 LTX PF NONST CRM (GLOVES AND ACCESSORIES) ×1 IMPLANT
GLOVE SURG 7.5 LTX PF NONST CRM (GLOVES AND ACCESSORIES) ×1
MBO - APPL 70% ISPRP 2% CHG 3ML CHLRPRP PREP STRL LF  CLR (MED SURG SUPPLIES) ×1
NEEDLE EPIDRL CLR 3.5IN 17GA PERIFIX TUOHY BPA WNG PVC FREE DEHP-FR STRL LF (MED SURG SUPPLIES) ×1 IMPLANT
SPONGE SURG 4X4IN 16 PLY XRY DETECT COTTON STRL LF  DISP (WOUND CARE SUPPLY) ×1 IMPLANT
SYRINGE PERIFIX 5ML LF  STRL LOR LS DEHP-FR BPA FREE MED METAL GLS (MED SURG SUPPLIES) ×1 IMPLANT

## 2023-01-24 NOTE — ACP (Advance Care Planning) (Signed)
Discussed with patient and daughter today. She does not wish to have any interventions taken should she expire. She is do not resuscitate and do not intubate.    Theresa Aday Hill-Reinert, DO

## 2023-01-24 NOTE — Nursing Note (Signed)
01/24/23 1259   Housing Stability   What is your housing situation? Has Housing   Are you worried about losing your housing? No   Employment   What is your current work situation? Other unemp.   Financial   In the past year, have you or any family members you live with been unable to get any of the following when it was really needed?  No   Transportation   Has lack of transportation kept you from medical appointments, meetings, work, or from getting things needed for daily living?  No   Social Connections   How often do you see or talk to people that you care about and feel close to? (For example: talking to friends on the phone, visiting friends or family, going to church or club meetings) 3-5x a wk   Intimate Partner Violence   In the past year, have you been afraid of your partner or ex-partner? No   Do you feel physically safe and emotionally safe where you currently live? Yes   Help Needed   Would you like help with any of these needs? No   Are any of these needs urgent? No

## 2023-01-24 NOTE — OR Surgeon (Signed)
Procedure: Diagnostic interlaminar epidural steroid injection under fluoroscopy guidance L5-S1  Pre-procedure Diagnosis: Lumbar Radiculopathy   Post procedure Diagnosis Same.   Surgeon Karl Bales, MD.  Assistant: None.  Anesthesia:  Local   Pre procedure pain score 9/10    This patient is known to our [facility] for Low back pain, scheduled today for a lumbar epidural steroid injection under fluoroscopy guidance, upon discussion of potential risks and benefits of the procedure including the option of not doing the procedure today, the patient elected to go ahead with the procedure and gave written informed consent.   Procedure in detail the patient walked to the procedure room and was placed prone on the procedure table with pillows under the abdomen and chest. Hemodynamic monitors consisting of noninvasive pulse oximetry were applied and the patient remained hemodynamically stable all throughout the procedure, the back was extensively prepped and draped in a standard sterile fashion with Chlorhexidine 10 ml stick. The C arm was brought above the patient's back and X-ray were taken to identify the appropriate interspace, we anesthetized the skin at the previously identified interspace with 3 cc of 1% lidocaine via a 25 G 1 1/2-inch needle. Through the anesthetized skin we introduced a 20 G epidural Tuohy needle under intermittent fluoroscopic guidance using the loss of resistance technique to Air , Contralateral X-ray was taken and confirmed the epidural needle depth. After negative aspiration for BLOOD, CSF and fluids and no paresthesia whatsoever. Needle placement with confirmed with contrast injection which showed epidural spread of the contrast. we injected a total of 80 mg Depomedrol and 3 cc preservative free Normal Saline.  Needle was flushed with 2 additional cc of saline.  Needle was taken out intact. Recovery The patient tolerated the procedure very well. Back was cleaned with Alcohol and  sterile Band-Aids were applied. The patient returned to the Recovery Room and recovered for an additional 20 minutes before being discharged home with written discharge instructions and a ride.    Plan:      The patient tolerated the procedure well today, the patient will come back in follow-up in 1 month in the office.      Electronically Signed: Karl Bales, MD 01/24/2023, 14:36

## 2023-01-24 NOTE — Progress Notes (Unsigned)
[facility]  PEDIATRICS/INTERNAL MEDICINE, [facility]  [address]  [place] [address]  [phone]  01/28/2023       Name:  Theresa Greene MRN: [id number]   Date:    01/24/2023 Age:  88 y.o.       Reason for Visit:  Chief Complaint   Patient presents with    Hypertension     ck    Kidney Failure    Lab Results     Patient presents today for HTN, CKD and lab reviews.        Subjective:   Theresa Greene is a 88 y.o. female who comes in today for follow up on hypertension and kidney failure. Her daughter provides the history. She has been having pain in her left foot and ankle. She was seen at the Emergency Room for this on 12/20/2022 for this. An XR was completed and showed postoperative changes, but no complication.  She was prescribed tramadol 50 mg 3 times daily as needed for the pain.  However, her daughter states that she is afraid of the medication and did not even fill it. She saw Dr. Teresita Madura office for this on May 16th. They completed a full evaluation and Dr. Caralyn Guile did not believe this was secondary to her rheumatoid arthritis.  She was concerned for a DVT as the edema was unilateral.  Peripheral venous duplex was completed on 12/28/2022. This showed no DVT. Dr. Caralyn Guile did recommend that she start wearing compression stockings to see if this would improve her symptoms at all.  She does say that they helped with the pain for the 1st few days, but she is not noticing much difference at this point.      Her daughter says that she started having trouble bearing weight on 01/21/2023 and she has been fairly confined to the recliner since that time. Her daughter says that she is doing a little better today. She is taking Tylenol 2-3 times per day, but her daughter says that she has to really be hurting to take it.  She also has Neurontin 100 mg for twice daily dosing, but typically only takes this at night that caused her daughter says she  feels like it upsets her stomach.  She does not like taking medications.  She says that they all upset her stomach.  Her daughter says that this is actually secondary to the fact that she is not eating.  She says that it is a vicious cycle.  She is in pain, so she does not eat and then when she gets up and takes the medication it upsets her stomach because she has not eaten.  We were not able to get a weight on her today because she would not get out of the wheelchair.    Her daughter says that she has not been eating well just because she doesn't feel well.  She has a representative coming 5 days per week from the [employer].  She will do light housework, grocery shopping, and meal preparation.  Her daughter says that they have discussed with them that she does not want to have any interventions performed if she should go into cardiac arrest.  They did complete a living will today stating such.  Daughter has asked that I place this order into the computer so that it will be on file.  I have attempted to do this, but it does not look like it  can be done on an outpatient basis.  A new order is required at every admission for this.  Her daughter is also asking about a referral to palliative care.  They would be interested in someone coming in to the home to increase the frequency of contact she has, as she would not agree to a full-time caregiver.    She also had laboratories completed to review today.    Past Medical History:    Past Medical History:   Diagnosis Date    Arthritis     Asthma     Back problem     CAD S/P percutaneous coronary angioplasty 2010    Pt reports 5 years ago    Charcot's joint of foot, left 08/06/2014    Colon polyp     CVA (cerebrovascular accident) (CMS HCC) 1995    no residual deficits    Deaf     Diverticulitis of colon     Gastroesophageal reflux disease 08/06/2014    GI bleed     H/O hearing loss     History of cardiac pacemaker 08/06/2014    HTN (hypertension)      Hyperlipidemia 12/25/2019    Muscle weakness     Neuropathy (CMS HCC)     Pacemaker     Paroxysmal atrial fibrillation (CMS HCC) 08/06/2014    PUD (peptic ulcer disease) 08/06/2014    C/b GIB Spring 2015    Rheumatoid arthritis (CMS HCC) 08/06/2014    Stage 3 chronic kidney disease (CMS HCC) 12/25/2019    Last Assessment & Plan:  Renal function stable, at baseline Stable electrolytes; restart sodium bicarb 650 mg BID  Continue renal protective ARB Discussed CKD risk factor modifications, including tight BP/glucose control, low protein diet, and avoidance of NSAIDs    Stented coronary artery     Vitamin D deficiency 12/25/2019    Wears glasses       Past Surgical History:    Past Surgical History:   Procedure Laterality Date    ANKLE SURGERY Left     repair with metal    CATARACT EXTRACTION      unsure which eye but thinks she only had one eye done    CORONARY ARTERY ANGIOPLASTY  2011    ESOPHAGOGASTRODUODENOSCOPY      HIP SURGERY Right     repair with metal    HX BACK SURGERY      lower back x 3 no metal    HX COLONOSCOPY      HX HIP REPLACEMENT Left     HX NISSEN FUNDOPLICATION  2002    then three others to repair problems    HX OPEN CHOLECYSTECTOMY  1962    with common duxt exploration    HX PACEMAKER INSERTION  2019    HX SHOULDER SURGERY Bilateral     scopes    KNEE ARTHROPLASTY Bilateral     OPEN REDUCTION INTERNAL FIXATION FRACTURE FEMUR Right 05/07/2020    Performed by Jeanell Sparrow, MD at [facility] OR [facility]TH    ORIF DISTAL FEMUR FRACTURE Right     OTHER SURGICAL HISTORY Right     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    PANCREATIC PSEUDOCYST DRAINAGE  1971    REPAIR ECTROPION Right 09/21/2021    Performed by Pierce Crane, MD at [facility] OR MAIN    REPAIR PTOSIS Left 09/21/2021    Performed by Pierce Crane, MD at [facility] OR MAIN  TOTAL ABDOMINAL HYSTERECTOMY       Family History:    Family Medical History:       Problem Relation (Age of Onset)    Brain cancer Mother    Ovarian Cancer Mother     Pancreatic Cancer Father             Social History:   Social History     Tobacco Use    Smoking status: Never    Smokeless tobacco: Never   Substance Use Topics    Alcohol use: Never      Social History     Substance and Sexual Activity   Drug Use Never      Social History     Social History Narrative    Lives at home with husband. 2 grown daughters - an Charity fundraiser and a Mining engineer. She did Building control surveyor for 23 years.         Allergies:    Allergies   Allergen Reactions    Iodinated Contrast Media Anaphylaxis    Iv Contrast Anaphylaxis     makese her serverly sick    Etodolac NO Steroids unless approved by Attending Physician    Symbicort [Budesonide-Formoterol]     Codeine  Other Adverse Reaction (Add comment) and Nausea/ Vomiting     Stomach pain  Stomach pain        Home Medications:    Current Outpatient Medications:     acetaminophen (TYLENOL ARTHRITIS PAIN) 650 mg Oral Tablet Sustained Release, Take 2 Tablets (1,300 mg total) by mouth Twice per day as needed for Pain, Disp: , Rfl:     amiodarone (PACERONE) 200 mg Oral Tablet, Take 1 Tablet (200 mg total) by mouth Once a day, Disp: 90 Tablet, Rfl: 3    amLODIPine (NORVASC) 5 mg Oral Tablet, Take 1 Tablet (5 mg total) by mouth Once a day, Disp: , Rfl:     aspirin 81 mg Oral Tablet, Chewable, Chew 1 Tablet (81 mg total) Once a day, Disp: , Rfl:     atorvastatin (LIPITOR) 20 mg Oral Tablet, TAKE ONE TABLET BY MOUTH DAILY, Disp: 90 Tablet, Rfl: 0    calcitrioL (ROCALTROL) 0.5 mcg Oral Capsule, Take 1 Capsule (0.5 mcg total) by mouth Every MON, WED, FRI and SAT, Disp: , Rfl:     cephalexin (KEFLEX) 250 mg Oral Capsule, TAKE ONE CAPSULE BY MOUTH EVERY MORNING, Disp: 90 Capsule, Rfl: 3    Dexlansoprazole (DEXILANT) 60 mg Oral Cap, Delayed Rel., Multiphasic, TAKE 1 CAPSULE BY MOUTH ONCE DAILY, Disp: 90 Capsule, Rfl: 1    ergocalciferol, vitamin D2, (DRISDOL) 1,250 mcg (50,000 unit) Oral Capsule, Take 1 Capsule (50,000 Units total) by mouth Every MON and FRI for  336 days Takes on Fridays, Disp: 96 Capsule, Rfl: 0    folic acid (FOLVITE) 1 mg Oral Tablet, Take 1 Tablet (1 mg total) by mouth Once a day for 90 days, Disp: 90 Tablet, Rfl: 0    gabapentin (NEURONTIN) 100 mg Oral Capsule, Take 1 Capsule (100 mg total) by mouth Twice daily, Disp: 180 Capsule, Rfl: 0    lipase-protease-amylase 5,000-17,000- 24,000 unit Oral Capsule, Delayed Release(E.C.), Take 1 Capsule by mouth Three times daily with meals for 360 days, Disp: 270 Capsule, Rfl: 3    losartan (COZAAR) 50 mg Oral Tablet, Take 1 Tablet (50 mg total) by mouth Twice daily, Disp: 180 Tablet, Rfl: 1    LYSINE ORAL, Take 100 mg by mouth Once per day as needed, Disp: ,  Rfl:     melatonin 10 mg Oral Tablet, Take 1 Tablet (10 mg total) by mouth Every night, Disp: , Rfl:     methotrexate 2.5 mg Oral tablet, Take 4 Tablets (10 mg total) by mouth Every 7 days for 90 days, Disp: 48 Tablet, Rfl: 0    metoprolol succinate (TOPROL-XL) 100 mg Oral Tablet Sustained Release 24 hr, Take 1 Tablet (100 mg total) by mouth Once a day, Disp: 90 Tablet, Rfl: 0    mirabegron (MYRBETRIQ) 50 mg Oral Tablet Sustained Release 24 hr, TAKE ONE CAPSULE IN THE MORNING, Disp: 90 Each, Rfl: 3    MYRBETRIQ 50 mg Oral Tablet Sustained Release 24 hr, Take 1 Tablet (50 mg total) by mouth Once a day, Disp: , Rfl:     nitroGLYCERIN (NITROSTAT) 0.4 mg Sublingual Tablet, Sublingual, Place 1 Tablet (0.4 mg total) under the tongue Every 5 minutes as needed for Chest pain (after three doses, call ER if pain still persists) for 3 doses over 15 minutes, Disp: 25 Tablet, Rfl: 5    sertraline (ZOLOFT) 25 mg Oral Tablet, Take 0.5 Tablets (12.5 mg total) by mouth Once a day for 90 days, Disp: 45 Tablet, Rfl: 0    sodium bicarbonate 650 mg Oral Tablet, Take 1 Tablet (650 mg total) by mouth Twice daily, Disp: 180 Tablet, Rfl: 3       Review of Systems:   All systems have been reviewed and found to be negative except for as stated above in the history of present illness.      Physical Examination:   Vitals: BP 110/78   Pulse 73   Temp 36.5 C (97.7 F) (Temporal)   SpO2 93%     General:  No apparent distress.  Family at bedside.  Interactive.  Cachectic and feeble.  HEENT:  Atraumatic, normocephalic.  EOMI. Sclera and conjunctiva are clear. Nares patent without lesion or discharge. Mucous membranes moist.  Neck:  Supple. Trachea is midline. No JVD. No adenopathy. No thyromegaly.  Respiratory: No respiratory distress. Lungs clear to auscultation bilaterally. No wheezes, rales, or rhonchi.  Cardiovascular: Regular rate and rhythm. S1, S2 present. No murmurs, rubs, or gallops.  Musculoskeletal:  Compression stockings present on bilateral lower extremities.  There does appear to be some mild edema about the left ankle.  This is extremely tender to the touch when I attempted to palpate the area.  She really did not tolerate much more examination than that.  Extremities:  No clubbing or cyanosis.     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 Discussed with her daughter that I really would not want to put her on long term narcotics secondary to her age and being a fall risk. If she were to go with Palliative Care or Hospice, this may be more of an acceptable option. Recommended that she continue to take the Tylenol 3-4 times daily depending on dosage. If she would agree to take it more frequently, I would change it over to Tylenol  500 mg and take it every 4-6 hours. Right now she is taking it only 2-3 times per day and it is the 650 mg  dosage.  Better controlled than usual. Continue on Toprol XL 100 mg oral daily, losartan 50 mg oral twice daily, and amlodipine 5mg  oral daily.   We previously had stopped her Zetia, as she was trying to decrease her number of pills. Her LDL is 75 with triglycerides of 107 on recent check. I think we can probably discontinue the gemfibrozil as well. It is not making a terribly significant different or decreasing her mortality at this point. Continue on Lipitor 20 mg oral nightly. We can increase this if need really be.   Following with Dr. Marlene Bast in [place]. Currently on Toprol XL 100 mg oral daily and amiodarone 200 mg oral daily. She is on an aspirin 81 mg oral daily, but not full anticoagulation secondary to fall risk.   Creatinine is up to 1.65 from her baseline of 1.2-1.3. This is likely from her poor oral intake recently.   She is currently taking calcitriol 0.5 mcg oral daily. Her PTH is actually suppressed to <4.0. Instructed daughter to cut calcitriol 0.5 mcg back to 4 days per week on Monday, Wednesday, Friday, and Saturday. Recheck labs prior to next visit.   Reviewed with daughter and patient that she has completed her Living Will and she does not want any heroic interventions. It appears that I am not able to enter this into Epic and a new order has to be placed each time, so they will have to let the provider know if/when she presents to the Emergency Department to have it saved to show on her chart.      Patient was counseled on diagnosis, follow-up, and side effect of medications.  Reasons for follow up in clinic or emergency department were discussed.    Orders:  No orders of the defined types were placed in this encounter.     Referrals:   Orders Placed This Encounter   No orders of the following type(s) were placed in this encounter: Outpatient Referral.      Discharge Medication List:       Current Discharge Medication List            Accurate as of January 24, 2023  1:39 PM. If you have any  questions, ask your nurse or doctor.                CONTINUE these medications which have CHANGED during your visit.        Details   calcitrioL 0.5 mcg Capsule  Commonly known as: ROCALTROL  What changed: Another medication with the same name was removed. Continue taking this medication, and follow the directions you see here.   0.5 mcg, Oral, EVERY MO, WE, FR AND SA  Refills: 0            CONTINUE these medications - NO CHANGES were made during your visit.        Details   acetaminophen 650 mg Tablet Sustained Release  Commonly known as: TYLENOL ARTHRITIS PAIN   1,300 mg, Oral, 2 TIMES DAILY PRN  Refills: 0     amiodarone 200 mg Tablet  Commonly known as: PACERONE   200 mg, Oral, DAILY  Qty: 90 Tablet  Refills: 3     amLODIPine 5 mg Tablet  Commonly known as: NORVASC   5 mg, Oral, DAILY  Refills: 0     aspirin  81 mg Tablet, Chewable   81 mg, Oral, DAILY  Refills: 0     atorvastatin 20 mg Tablet  Commonly known as: LIPITOR   20 mg, Oral, DAILY  Qty: 90 Tablet  Refills: 0     cephalexin 250 mg Capsule  Commonly known as: KEFLEX   TAKE ONE CAPSULE BY MOUTH EVERY MORNING  Qty: 90 Capsule  Refills: 3     Dexlansoprazole 60 mg Cap, Delayed Rel., Multiphasic  Commonly known as: DEXILANT   60 mg, Oral, DAILY  Qty: 90 Capsule  Refills: 1     ergocalciferol (vitamin D2) 1,250 mcg (50,000 unit) Capsule  Commonly known as: DRISDOL   50,000 Units, Oral, EVERY MO AND FR, Takes on Fridays  Qty: 96 Capsule  Refills: 0     folic acid 1 mg Tablet  Commonly known as: FOLVITE   1 mg, Oral, DAILY  Qty: 90 Tablet  Refills: 0     gabapentin 100 mg Capsule  Commonly known as: NEURONTIN   100 mg, Oral, 2 TIMES DAILY  Qty: 180 Capsule  Refills: 0     lipase-protease-amylase 5,000-17,000- 24,000 unit Capsule, Delayed Release(E.C.)   1 Capsule, Oral, 3 TIMES DAILY WITH MEALS  Qty: 270 Capsule  Refills: 3     losartan 50 mg Tablet  Commonly known as: COZAAR   50 mg, Oral, 2 TIMES DAILY  Qty: 180 Tablet  Refills: 1     LYSINE ORAL   100 mg,  Oral, DAILY PRN  Refills: 0     melatonin 10 mg Tablet   10 mg, Oral, NIGHTLY  Refills: 0     methotrexate 2.5 mg tablet   10 mg, Oral, EVERY 7 DAYS  Qty: 48 Tablet  Refills: 0     metoprolol succinate 100 mg Tablet Sustained Release 24 hr  Commonly known as: TOPROL-XL   100 mg, Oral, DAILY  Qty: 90 Tablet  Refills: 0     * Myrbetriq 50 mg Tablet Sustained Release 24 hr  Generic drug: mirabegron   Take 1 Tablet (50 mg total) by mouth Once a day  Refills: 0     * Myrbetriq 50 mg Tablet Sustained Release 24 hr  Generic drug: mirabegron   TAKE ONE CAPSULE IN THE MORNING  Qty: 90 Each  Refills: 3     nitroGLYCERIN 0.4 mg Tablet, Sublingual  Commonly known as: NITROSTAT   0.4 mg, Sublingual, EVERY 5 MIN PRN, for 3 doses over 15 minutes  Qty: 25 Tablet  Refills: 5     sertraline 25 mg Tablet  Commonly known as: ZOLOFT   12.5 mg, Oral, DAILY  Qty: 45 Tablet  Refills: 0     sodium bicarbonate 650 mg Tablet   650 mg, Oral, 2 TIMES DAILY  Qty: 180 Tablet  Refills: 3           * This list has 2 medication(s) that are the same as other medications prescribed for you. Read the directions carefully, and ask your doctor or other care provider to review them with you.                STOP taking these medications.      gemfibroziL 600 mg Tablet  Commonly known as: LOPID     traMADoL 50 mg Tablet  Commonly known as: ULTRAM                 Follow up: Return in about 3 months (around 04/26/2023) for HTN, CKD,  AFib with Laboratories. Patient was advised to follow up sooner if issues arise.     Sidda Humm Hill-Reinert, DO    This note was partially created using M*Modal fluency direct system (voice recognition software ) and is inherently subject to errors including those of syntax and "sound- alike" substitutions which may escape proofreading.  In such instances, original meaning may be extrapolated by contextual derivation.

## 2023-01-25 DIAGNOSIS — Z66 Do not resuscitate: Secondary | ICD-10-CM | POA: Insufficient documentation

## 2023-01-25 DIAGNOSIS — N2581 Secondary hyperparathyroidism of renal origin: Secondary | ICD-10-CM | POA: Insufficient documentation

## 2023-01-25 DIAGNOSIS — Z789 Other specified health status: Secondary | ICD-10-CM | POA: Insufficient documentation

## 2023-01-28 ENCOUNTER — Encounter (INDEPENDENT_AMBULATORY_CARE_PROVIDER_SITE_OTHER): Payer: Self-pay | Admitting: Internal Medicine

## 2023-01-31 ENCOUNTER — Other Ambulatory Visit (INDEPENDENT_AMBULATORY_CARE_PROVIDER_SITE_OTHER): Payer: Self-pay | Admitting: CARDIOVASCULAR DISEASE

## 2023-01-31 ENCOUNTER — Other Ambulatory Visit (INDEPENDENT_AMBULATORY_CARE_PROVIDER_SITE_OTHER): Payer: Self-pay | Admitting: Internal Medicine

## 2023-01-31 ENCOUNTER — Encounter (INDEPENDENT_AMBULATORY_CARE_PROVIDER_SITE_OTHER): Payer: Self-pay | Admitting: Internal Medicine

## 2023-01-31 DIAGNOSIS — G629 Polyneuropathy, unspecified: Secondary | ICD-10-CM

## 2023-01-31 DIAGNOSIS — M069 Rheumatoid arthritis, unspecified: Secondary | ICD-10-CM

## 2023-01-31 DIAGNOSIS — M14672 Charcot's joint, left ankle and foot: Secondary | ICD-10-CM

## 2023-01-31 MED ORDER — METOPROLOL SUCCINATE ER 100 MG TABLET,EXTENDED RELEASE 24 HR
100.0000 mg | ORAL_TABLET | Freq: Every day | ORAL | 0 refills | Status: AC
Start: 2023-01-31 — End: ?

## 2023-01-31 MED ORDER — ATORVASTATIN 20 MG TABLET
20.0000 mg | ORAL_TABLET | Freq: Every day | ORAL | 0 refills | Status: AC
Start: 2023-01-31 — End: ?

## 2023-01-31 MED ORDER — FOLIC ACID 1 MG TABLET
1.0000 mg | ORAL_TABLET | Freq: Every day | ORAL | 0 refills | Status: AC
Start: 2023-01-31 — End: 2023-05-01

## 2023-01-31 MED ORDER — SERTRALINE 25 MG TABLET
12.5000 mg | ORAL_TABLET | Freq: Every day | ORAL | 0 refills | Status: DC
Start: 2023-01-31 — End: 2023-02-18

## 2023-01-31 MED ORDER — ERGOCALCIFEROL (VITAMIN D2) 1,250 MCG (50,000 UNIT) CAPSULE
50000.0000 [IU] | ORAL_CAPSULE | ORAL | 0 refills | Status: AC
Start: 2023-02-01 — End: 2024-01-03

## 2023-01-31 MED ORDER — NITROGLYCERIN 0.4 MG SUBLINGUAL TABLET
0.4000 mg | SUBLINGUAL_TABLET | SUBLINGUAL | 5 refills | Status: AC | PRN
Start: 2023-01-31 — End: ?

## 2023-01-31 MED ORDER — GABAPENTIN 100 MG CAPSULE
100.0000 mg | ORAL_CAPSULE | Freq: Two times a day (BID) | ORAL | 0 refills | Status: AC
Start: 2023-01-31 — End: ?

## 2023-01-31 MED ORDER — METHOTREXATE SODIUM 2.5 MG TABLET
10.0000 mg | ORAL_TABLET | ORAL | 0 refills | Status: AC
Start: 2023-01-31 — End: 2023-05-01

## 2023-01-31 NOTE — Telephone Encounter (Signed)
Pharmacy requested renewal on Nitroglycerine.  Will send in.

## 2023-01-31 NOTE — Telephone Encounter (Signed)
Completed by another staff member.

## 2023-02-06 ENCOUNTER — Other Ambulatory Visit: Payer: Self-pay

## 2023-02-06 ENCOUNTER — Emergency Department (HOSPITAL_COMMUNITY): Payer: Medicare Other

## 2023-02-06 ENCOUNTER — Encounter (INDEPENDENT_AMBULATORY_CARE_PROVIDER_SITE_OTHER): Payer: Self-pay | Admitting: Internal Medicine

## 2023-02-06 ENCOUNTER — Encounter (HOSPITAL_COMMUNITY): Payer: Self-pay

## 2023-02-06 ENCOUNTER — Emergency Department
Admission: EM | Admit: 2023-02-06 | Discharge: 2023-02-07 | Disposition: A | Discharge status: Other Acute Inpatient Hospital | Payer: Medicare Other | Attending: Family Medicine | Admitting: Family Medicine

## 2023-02-06 DIAGNOSIS — N189 Chronic kidney disease, unspecified: Secondary | ICD-10-CM | POA: Insufficient documentation

## 2023-02-06 DIAGNOSIS — R7989 Other specified abnormal findings of blood chemistry: Secondary | ICD-10-CM | POA: Insufficient documentation

## 2023-02-06 DIAGNOSIS — E860 Dehydration: Secondary | ICD-10-CM | POA: Insufficient documentation

## 2023-02-06 DIAGNOSIS — N179 Acute kidney failure, unspecified: Secondary | ICD-10-CM | POA: Insufficient documentation

## 2023-02-06 DIAGNOSIS — I129 Hypertensive chronic kidney disease with stage 1 through stage 4 chronic kidney disease, or unspecified chronic kidney disease: Secondary | ICD-10-CM | POA: Insufficient documentation

## 2023-02-06 DIAGNOSIS — R531 Weakness: Secondary | ICD-10-CM

## 2023-02-06 LAB — BASIC METABOLIC PANEL
ANION GAP: 15 mmol/L — ABNORMAL HIGH (ref 4–13)
BUN/CREA RATIO: 22 (ref 6–22)
BUN: 52 mg/dL — ABNORMAL HIGH (ref 8–25)
CALCIUM: 12.7 mg/dL — ABNORMAL HIGH (ref 8.6–10.3)
CHLORIDE: 100 mmol/L (ref 96–111)
CO2 TOTAL: 25 mmol/L (ref 23–31)
CREATININE: 2.36 mg/dL — ABNORMAL HIGH (ref 0.60–1.05)
ESTIMATED GFR - FEMALE: 20 mL/min/BSA — ABNORMAL LOW (ref >=60)
GLUCOSE: 111 mg/dL (ref 65–125)
POTASSIUM: 3.6 mmol/L (ref 3.5–5.1)
SODIUM: 140 mmol/L (ref 136–145)

## 2023-02-06 LAB — CBC WITH DIFF
BASOPHIL #: <0.1 10*3/uL (ref <=0.20)
BASOPHIL %: 0 %
EOSINOPHIL #: 0.12 10*3/uL (ref <=0.50)
EOSINOPHIL %: 3 %
HCT: 35.9 % (ref 34.8–46.0)
HGB: 11.8 g/dL (ref 11.5–16.0)
IMMATURE GRANULOCYTE #: <0.1 10*3/uL (ref <0.10)
IMMATURE GRANULOCYTE %: 1 % (ref 0.0–1.0)
LYMPHOCYTE #: 0.67 10*3/uL — ABNORMAL LOW (ref 1.00–4.80)
LYMPHOCYTE %: 16 %
MCH: 31.1 pg (ref 26.0–32.0)
MCHC: 32.9 g/dL (ref 31.0–35.5)
MCV: 94.5 fL (ref 78.0–100.0)
MONOCYTE #: 0.13 10*3/uL — ABNORMAL LOW (ref 0.20–1.10)
MONOCYTE %: 3 %
MPV: 10.3 fL (ref 8.7–12.5)
NEUTROPHIL #: 3.38 10*3/uL (ref 1.50–7.70)
NEUTROPHIL %: 77 %
PLATELETS: 274 10*3/uL (ref 150–400)
RBC: 3.8 10*6/uL — ABNORMAL LOW (ref 3.85–5.22)
RDW-CV: 16.1 % — ABNORMAL HIGH (ref 11.5–15.5)
WBC: 4.3 10*3/uL (ref 3.7–11.0)

## 2023-02-06 LAB — MAGNESIUM: MAGNESIUM: 2.2 mg/dL (ref 1.8–2.6)

## 2023-02-06 LAB — TROPONIN-I: TROPONIN-I HS: 31.2 ng/L (ref <=14.0)

## 2023-02-06 MED ORDER — SODIUM CHLORIDE 0.9 % IV BOLUS
1000.0000 mL | INJECTION | Status: AC
Start: 2023-02-06 — End: 2023-02-06
  Administered 2023-02-06: 0 mL via INTRAVENOUS
  Administered 2023-02-06: 1000 mL via INTRAVENOUS

## 2023-02-06 MED ORDER — ASPIRIN 81 MG CHEWABLE TABLET
324.0000 mg | CHEWABLE_TABLET | ORAL | Status: AC
Start: 2023-02-07 — End: 2023-02-06
  Administered 2023-02-06: 324 mg via ORAL
  Filled 2023-02-06: qty 4

## 2023-02-06 MED ORDER — HYDRALAZINE 20 MG/ML INJECTION SOLUTION
10.0000 mg | INTRAMUSCULAR | Status: AC
Start: 2023-02-07 — End: 2023-02-06
  Administered 2023-02-06: 10 mg via INTRAVENOUS
  Filled 2023-02-06: qty 1

## 2023-02-06 NOTE — ED Triage Notes (Signed)
EMS states "Call was for weakness x few days. Once loaded into the ambulance patient had a period of blank staring and on the monitor noticed what appeared to be V-Tach."    Patient is alert and oriented x4 upon triage.

## 2023-02-06 NOTE — ED Provider Notes (Signed)
[facility]  ED Primary Provider Note  History of Present Illness   Chief Complaint   Patient presents with    Weakness     Theresa Greene is a 88 y.o. female who had concerns including Weakness.  Arrival: The patient arrived by Ambulance    Pt to ER with generalized weakness.  Pt states over the past few days she has had increasing generalized weakness. She denies cp or sob.  Denies fever or chills.  Denies nvd.  Denies urinary sx.  EMS was concerned with pt having runs of vtach en route.  Pt with ventricular pacer spikes and wide complex paced beats noted on 12 lead that may have mimicked vtach during transport.          History Reviewed This Encounter: Medical History  Surgical History  Family History  Social History    Physical Exam   ED Triage Vitals [02/06/23 2151]   BP (Non-Invasive) (!) 134/95   Heart Rate 86   Respiratory Rate 18   Temperature 36.7 C (98 F)   SpO2 93 %   Weight 53.1 kg (117 lb)   Height 1.6 m (5\' 3" )     Physical Exam  Constitutional:       General: She is not in acute distress.     Appearance: She is not toxic-appearing.   HENT:      Head: Normocephalic and atraumatic.      Mouth/Throat:      Mouth: Mucous membranes are dry.   Eyes:      Extraocular Movements: Extraocular movements intact.      Conjunctiva/sclera: Conjunctivae normal.      Pupils: Pupils are equal, round, and reactive to light.   Cardiovascular:      Rate and Rhythm: Normal rate. Rhythm irregular.      Pulses: Normal pulses.      Heart sounds: Normal heart sounds. No murmur heard.     No friction rub. No gallop.   Pulmonary:      Effort: No respiratory distress.      Breath sounds: No wheezing, rhonchi or rales.   Abdominal:      General: There is no distension.      Palpations: Abdomen is soft.      Tenderness: There is no abdominal tenderness. There is no guarding or rebound.   Musculoskeletal:         General: Normal range of motion.   Skin:     General: Skin is warm and dry.       Capillary Refill: Capillary refill takes less than 2 seconds.      Coloration: Skin is not jaundiced or pale.      Comments: poor turgor   Neurological:      General: No focal deficit present.      Mental Status: She is alert and oriented to person, place, and time. Mental status is at baseline.      Cranial Nerves: No cranial nerve deficit.      Sensory: No sensory deficit.      Motor: No weakness.      Coordination: Coordination normal.   Psychiatric:         Mood and Affect: Mood normal.         Behavior: Behavior normal.         Thought Content: Thought content normal.         Judgment: Judgment normal.       Patient Data  Labs Ordered/Reviewed   BASIC METABOLIC PANEL - Abnormal; Notable for the following components:       Result Value    ANION GAP 15 (*)     CALCIUM 12.7 (*)     BUN 52 (*)     CREATININE 2.36 (*)     ESTIMATED GFR - FEMALE 20 (*)     All other components within normal limits   CBC WITH DIFF - Abnormal; Notable for the following components:    RBC 3.80 (*)     RDW-CV 16.1 (*)     LYMPHOCYTE # 0.67 (*)     MONOCYTE # 0.13 (*)     All other components within normal limits   URINALYSIS, MACROSCOPIC - Abnormal; Notable for the following components:    BLOOD Trace (*)     All other components within normal limits   TROPONIN-I - Abnormal; Notable for the following components:    TROPONIN-I HS 31.2 (*)     All other components within normal limits   BASIC METABOLIC PANEL - Abnormal; Notable for the following components:    POTASSIUM 3.3 (*)     CALCIUM 11.6 (*)     BUN 48 (*)     CREATININE 1.99 (*)     BUN/CREA RATIO 24 (*)     ESTIMATED GFR - FEMALE 24 (*)     All other components within normal limits   TROPONIN-I - Abnormal; Notable for the following components:    TROPONIN-I HS 32.2 (*)     All other components within normal limits   MAGNESIUM - Normal   URINALYSIS, MICROSCOPIC - Normal   CBC/DIFF    Narrative:     The following orders were created for panel order CBC/DIFF.  Procedure                                Abnormality         Status                     ---------                               -----------         ------                     CBC WITH WJXB[[id number]]                Abnormal            Final result                 Please view results for these tests on the individual orders.   URINALYSIS, MACROSCOPIC AND MICROSCOPIC W/CULTURE REFLEX    Narrative:     The following orders were created for panel order URINALYSIS, MACROSCOPIC AND MICROSCOPIC W/CULTURE REFLEX.  Procedure                               Abnormality         Status                     ---------                               -----------         ------  URINALYSIS, MACROSCOPIC[[id number]]      Abnormal            Final result               URINALYSIS, MICROSCOPIC[[id number]]      Normal              Final result                 Please view results for these tests on the individual orders.   EXTRA TUBES    Narrative:     The following orders were created for panel order EXTRA TUBES.  Procedure                               Abnormality         Status                     ---------                               -----------         ------                     BLUE TOP ZOXW[[id number]]                                    Final result               GOLD TOP JXBJ[[id number]]                                    Final result               GRAY TOP HYQM[[id number]]                                    Final result                 Please view results for these tests on the individual orders.   BLUE TOP TUBE   GOLD TOP TUBE   GRAY TOP TUBE     XR AP MOBILE CHEST   Final Result by Edi, Radresults In (06/26 2244)   Stable chronic changes. No acute cardiopulmonary process.                  Radiologist location ID: [id number]           Medical Decision Making        Medical Decision Making  check EKG/trop; cbc,cmp, ua, CXR ; r/o mi r/o electrolyte abnormality; pt clinically dehydrated on exam give ivf;     Problems Addressed:  AKI (acute  kidney injury) (CMS HCC): acute illness or injury     Details: AKI on CKD.  cont ivf  monitor   Elevated troponin: acute illness or injury     Details: possibly due to the above.  cont to monitor.  trend trop     Amount and/or Complexity of Data Reviewed  Labs: ordered.  Radiology: ordered.  ECG/medicine tests: ordered.    Risk  OTC drugs.  Prescription drug management.  Decision regarding hospitalization.  ED Course as of 02/07/23 0524   Thu Feb 07, 2023   0227 Patient with elevated blood pressure after initial exam.  We will give hydralazine.  Patient's troponin is noted to be borderline elevated patient also noted to have an AKI.  Given the patient has no chest pain feel that the elevated troponin could be due to renal functioning.            Medications Administered in the ED   NS premix infusion ( Intravenous New Bag/New Syringe 02/07/23 0058)   NS bolus infusion 1,000 mL (0 mL Intravenous Stopped 02/06/23 2322)   hydrALAZINE (APRESOLINE) injection 10 mg (10 mg Intravenous Given 02/06/23 2342)   aspirin chewable tablet 324 mg (324 mg Oral Given 02/06/23 2343)   NS bolus infusion 500 mL (0 mL Intravenous Stopped 02/07/23 0101)     Clinical Impression   AKI (acute kidney injury) (CMS HCC) (Primary)   Elevated troponin       Disposition: Transfered to Another Facility

## 2023-02-06 NOTE — ED Nurses Note (Signed)
Patient resting in bed with eyes open talking to lab at bedside. No obvious signs of distress noted. Bilateral chest rise and fall noted. Cardiac/respiratory monitoring in place along with fall precautions. Call light in reach. Care ongoing.

## 2023-02-06 NOTE — ED Nurses Note (Signed)
Xray at bedside for mobile chest xray

## 2023-02-06 NOTE — ED Nurses Note (Signed)
Patient has normal saline bolus hanging from EMS will complete that bolus to take place of previously scanned normal saline in MAR.

## 2023-02-06 NOTE — ED Nurses Note (Signed)
Dr. Floria Raveling notified of patients blood pressure. No orders currently

## 2023-02-07 ENCOUNTER — Encounter (HOSPITAL_COMMUNITY): Payer: Self-pay

## 2023-02-07 ENCOUNTER — Inpatient Hospital Stay
Admission: AD | Admit: 2023-02-07 | Discharge: 2023-02-18 | DRG: 682 | Disposition: A | Discharge status: Skilled Nursing Facility | Payer: Medicare Other | Source: Other Acute Inpatient Hospital | Attending: HOSPITALIST-INTERNAL MEDICINE | Admitting: HOSPITALIST-INTERNAL MEDICINE

## 2023-02-07 ENCOUNTER — Encounter (HOSPITAL_COMMUNITY): Payer: Self-pay | Admitting: Family Medicine

## 2023-02-07 ENCOUNTER — Ambulatory Visit (HOSPITAL_BASED_OUTPATIENT_CLINIC_OR_DEPARTMENT_OTHER): Payer: Self-pay | Admitting: Pain Medicine

## 2023-02-07 ENCOUNTER — Observation Stay (HOSPITAL_COMMUNITY): Payer: Medicare Other

## 2023-02-07 ENCOUNTER — Inpatient Hospital Stay (HOSPITAL_COMMUNITY): Payer: Medicare Other | Admitting: HOSPITALIST

## 2023-02-07 DIAGNOSIS — E876 Hypokalemia: Secondary | ICD-10-CM | POA: Diagnosis not present

## 2023-02-07 DIAGNOSIS — I10 Essential (primary) hypertension: Secondary | ICD-10-CM | POA: Diagnosis present

## 2023-02-07 DIAGNOSIS — Z8711 Personal history of peptic ulcer disease: Secondary | ICD-10-CM

## 2023-02-07 DIAGNOSIS — H9190 Unspecified hearing loss, unspecified ear: Secondary | ICD-10-CM | POA: Diagnosis present

## 2023-02-07 DIAGNOSIS — M810 Age-related osteoporosis without current pathological fracture: Secondary | ICD-10-CM | POA: Diagnosis present

## 2023-02-07 DIAGNOSIS — K222 Esophageal obstruction: Secondary | ICD-10-CM | POA: Diagnosis present

## 2023-02-07 DIAGNOSIS — E559 Vitamin D deficiency, unspecified: Secondary | ICD-10-CM | POA: Diagnosis present

## 2023-02-07 DIAGNOSIS — E785 Hyperlipidemia, unspecified: Secondary | ICD-10-CM | POA: Diagnosis present

## 2023-02-07 DIAGNOSIS — R627 Adult failure to thrive: Secondary | ICD-10-CM | POA: Diagnosis present

## 2023-02-07 DIAGNOSIS — Z8673 Personal history of transient ischemic attack (TIA), and cerebral infarction without residual deficits: Secondary | ICD-10-CM

## 2023-02-07 DIAGNOSIS — J159 Unspecified bacterial pneumonia: Secondary | ICD-10-CM | POA: Diagnosis not present

## 2023-02-07 DIAGNOSIS — Z79899 Other long term (current) drug therapy: Secondary | ICD-10-CM

## 2023-02-07 DIAGNOSIS — J45909 Unspecified asthma, uncomplicated: Secondary | ICD-10-CM | POA: Diagnosis present

## 2023-02-07 DIAGNOSIS — E43 Unspecified severe protein-calorie malnutrition: Secondary | ICD-10-CM | POA: Diagnosis present

## 2023-02-07 DIAGNOSIS — M069 Rheumatoid arthritis, unspecified: Secondary | ICD-10-CM | POA: Diagnosis present

## 2023-02-07 DIAGNOSIS — Z8616 Personal history of COVID-19: Secondary | ICD-10-CM

## 2023-02-07 DIAGNOSIS — I2510 Atherosclerotic heart disease of native coronary artery without angina pectoris: Secondary | ICD-10-CM | POA: Diagnosis present

## 2023-02-07 DIAGNOSIS — K279 Peptic ulcer, site unspecified, unspecified as acute or chronic, without hemorrhage or perforation: Secondary | ICD-10-CM | POA: Diagnosis present

## 2023-02-07 DIAGNOSIS — Z7982 Long term (current) use of aspirin: Secondary | ICD-10-CM

## 2023-02-07 DIAGNOSIS — J439 Emphysema, unspecified: Secondary | ICD-10-CM | POA: Diagnosis present

## 2023-02-07 DIAGNOSIS — R9431 Abnormal electrocardiogram [ECG] [EKG]: Secondary | ICD-10-CM

## 2023-02-07 DIAGNOSIS — K219 Gastro-esophageal reflux disease without esophagitis: Secondary | ICD-10-CM | POA: Diagnosis present

## 2023-02-07 DIAGNOSIS — N1832 Chronic kidney disease, stage 3b: Secondary | ICD-10-CM | POA: Diagnosis present

## 2023-02-07 DIAGNOSIS — J9601 Acute respiratory failure with hypoxia: Secondary | ICD-10-CM | POA: Diagnosis not present

## 2023-02-07 DIAGNOSIS — I129 Hypertensive chronic kidney disease with stage 1 through stage 4 chronic kidney disease, or unspecified chronic kidney disease: Secondary | ICD-10-CM | POA: Diagnosis present

## 2023-02-07 DIAGNOSIS — Z91041 Radiographic dye allergy status: Secondary | ICD-10-CM

## 2023-02-07 DIAGNOSIS — I480 Paroxysmal atrial fibrillation: Secondary | ICD-10-CM | POA: Diagnosis present

## 2023-02-07 DIAGNOSIS — Z6820 Body mass index (BMI) 20.0-20.9, adult: Secondary | ICD-10-CM

## 2023-02-07 DIAGNOSIS — R531 Weakness: Secondary | ICD-10-CM | POA: Diagnosis present

## 2023-02-07 DIAGNOSIS — N179 Acute kidney failure, unspecified: Principal | ICD-10-CM | POA: Diagnosis present

## 2023-02-07 DIAGNOSIS — M1990 Unspecified osteoarthritis, unspecified site: Secondary | ICD-10-CM | POA: Diagnosis present

## 2023-02-07 DIAGNOSIS — M14672 Charcot's joint, left ankle and foot: Secondary | ICD-10-CM | POA: Diagnosis present

## 2023-02-07 DIAGNOSIS — Z955 Presence of coronary angioplasty implant and graft: Secondary | ICD-10-CM

## 2023-02-07 DIAGNOSIS — Z950 Presence of cardiac pacemaker: Secondary | ICD-10-CM

## 2023-02-07 DIAGNOSIS — G9341 Metabolic encephalopathy: Secondary | ICD-10-CM | POA: Diagnosis not present

## 2023-02-07 DIAGNOSIS — R7989 Other specified abnormal findings of blood chemistry: Secondary | ICD-10-CM | POA: Diagnosis present

## 2023-02-07 DIAGNOSIS — W19XXXA Unspecified fall, initial encounter: Secondary | ICD-10-CM | POA: Diagnosis present

## 2023-02-07 DIAGNOSIS — Z515 Encounter for palliative care: Secondary | ICD-10-CM

## 2023-02-07 LAB — CBC WITH DIFF
BASOPHIL #: <0.1 10*3/uL (ref <=0.20)
BASOPHIL %: 0.4 %
EOSINOPHIL #: 0.12 10*3/uL (ref <=0.50)
EOSINOPHIL %: 2.7 %
HCT: 36.3 % (ref 34.8–46.0)
HGB: 11.5 g/dL (ref 11.5–16.0)
IMMATURE GRANULOCYTE #: <0.1 10*3/uL (ref <0.10)
IMMATURE GRANULOCYTE %: 0.7 % (ref 0.0–1.0)
LYMPHOCYTE #: 0.64 10*3/uL — ABNORMAL LOW (ref 1.00–4.80)
LYMPHOCYTE %: 14.2 %
MCH: 30.1 pg (ref 26.0–32.0)
MCHC: 31.7 g/dL (ref 31.0–35.5)
MCV: 95 fL (ref 78.0–100.0)
MONOCYTE #: 0.19 10*3/uL — ABNORMAL LOW (ref 0.20–1.10)
MONOCYTE %: 4.2 %
MPV: 10.2 fL (ref 8.7–12.5)
NEUTROPHIL #: 3.5 10*3/uL (ref 1.50–7.70)
NEUTROPHIL %: 77.8 %
PLATELETS: 247 10*3/uL (ref 150–400)
RBC: 3.82 10*6/uL — ABNORMAL LOW (ref 3.85–5.22)
RDW-CV: 16.2 % — ABNORMAL HIGH (ref 11.5–15.5)
WBC: 4.5 10*3/uL (ref 3.7–11.0)

## 2023-02-07 LAB — GOLD TOP TUBE

## 2023-02-07 LAB — COMPREHENSIVE METABOLIC PANEL, NON-FASTING
ALBUMIN: 2.8 g/dL — ABNORMAL LOW (ref 3.4–4.8)
ALKALINE PHOSPHATASE: 100 U/L (ref 55–145)
ALT (SGPT): 10 U/L (ref 8–22)
ANION GAP: 14 mmol/L — ABNORMAL HIGH (ref 4–13)
AST (SGOT): 21 U/L (ref 8–45)
BILIRUBIN TOTAL: 0.4 mg/dL (ref 0.3–1.3)
BUN/CREA RATIO: 24 — ABNORMAL HIGH (ref 6–22)
BUN: 47 mg/dL — ABNORMAL HIGH (ref 8–25)
CALCIUM: 11.3 mg/dL — ABNORMAL HIGH (ref 8.6–10.3)
CHLORIDE: 104 mmol/L (ref 96–111)
CO2 TOTAL: 24 mmol/L (ref 23–31)
CREATININE: 1.97 mg/dL — ABNORMAL HIGH (ref 0.60–1.05)
ESTIMATED GFR - FEMALE: 24 mL/min/BSA — ABNORMAL LOW (ref >=60)
GLUCOSE: 62 mg/dL — ABNORMAL LOW (ref 65–125)
POTASSIUM: 3.5 mmol/L (ref 3.5–5.1)
PROTEIN TOTAL: 6 g/dL (ref 6.0–8.0)
SODIUM: 142 mmol/L (ref 136–145)

## 2023-02-07 LAB — URINALYSIS, MACROSCOPIC
BILIRUBIN: NEGATIVE mg/dL
GLUCOSE: NEGATIVE mg/dL
KETONES: NEGATIVE mg/dL
LEUKOCYTES: NEGATIVE WBCs/uL
NITRITE: NEGATIVE
PH: 7 (ref 4.6–8.0)
PROTEIN: NEGATIVE mg/dL
SPECIFIC GRAVITY: 1.02 (ref 1.005–1.030)
UROBILINOGEN: 0.2 mg/dL (ref 0.2–1.0)

## 2023-02-07 LAB — TROPONIN-I
TROPONIN-I HS: 32.2 ng/L (ref <=14.0)
TROPONIN-I HS: 42.4 ng/L (ref <=14.0)
TROPONIN-I HS: 35.9 ng/L (ref <=14.0)

## 2023-02-07 LAB — BASIC METABOLIC PANEL
ANION GAP: 13 mmol/L (ref 4–13)
BUN/CREA RATIO: 24 — ABNORMAL HIGH (ref 6–22)
BUN: 48 mg/dL — ABNORMAL HIGH (ref 8–25)
CALCIUM: 11.6 mg/dL — ABNORMAL HIGH (ref 8.6–10.3)
CHLORIDE: 105 mmol/L (ref 96–111)
CO2 TOTAL: 23 mmol/L (ref 23–31)
CREATININE: 1.99 mg/dL — ABNORMAL HIGH (ref 0.60–1.05)
ESTIMATED GFR - FEMALE: 24 mL/min/BSA — ABNORMAL LOW (ref >=60)
GLUCOSE: 98 mg/dL (ref 65–125)
POTASSIUM: 3.3 mmol/L — ABNORMAL LOW (ref 3.5–5.1)
SODIUM: 141 mmol/L (ref 136–145)

## 2023-02-07 LAB — ECG 12 LEAD
Atrial Rate: 74 {beats}/min
Calculated P Axis: 93 degrees
Calculated R Axis: 75 degrees
Calculated T Axis: 44 degrees
PR Interval: 270 ms
QRS Duration: 114 ms
QT Interval: 340 ms
QTC Calculation: 377 ms
Ventricular rate: 74 {beats}/min

## 2023-02-07 LAB — VITAMIN B12: VITAMIN B 12: 415 pg/mL (ref 200–900)

## 2023-02-07 LAB — URINALYSIS, MICROSCOPIC

## 2023-02-07 LAB — PHOSPHORUS: PHOSPHORUS: 3.6 mg/dL (ref 2.3–4.0)

## 2023-02-07 LAB — MAGNESIUM: MAGNESIUM: 2 mg/dL (ref 1.8–2.6)

## 2023-02-07 LAB — GRAY TOP TUBE

## 2023-02-07 LAB — BLUE TOP TUBE

## 2023-02-07 MED ORDER — SODIUM CHLORIDE 0.9 % (FLUSH) INJECTION SYRINGE
3.0000 mL | INJECTION | INTRAMUSCULAR | Status: DC | PRN
Start: 2023-02-07 — End: 2023-02-18

## 2023-02-07 MED ORDER — ACETAMINOPHEN 500 MG TABLET
500.0000 mg | ORAL_TABLET | ORAL | Status: DC | PRN
Start: 2023-02-07 — End: 2023-02-18
  Administered 2023-02-09 – 2023-02-11 (×3): 500 mg via ORAL
  Filled 2023-02-07 (×3): qty 1

## 2023-02-07 MED ORDER — ERGOCALCIFEROL (VITAMIN D2) 1,250 MCG (50,000 UNIT) CAPSULE
50000.0000 [IU] | ORAL_CAPSULE | ORAL | Status: DC
Start: 2023-02-08 — End: 2023-02-18
  Administered 2023-02-08 – 2023-02-15 (×2): 50000 [IU] via ORAL
  Filled 2023-02-07 (×2): qty 1

## 2023-02-07 MED ORDER — AMLODIPINE 5 MG TABLET
5.0000 mg | ORAL_TABLET | Freq: Every day | ORAL | Status: DC
Start: 2023-02-07 — End: 2023-02-10
  Administered 2023-02-07 – 2023-02-09 (×3): 5 mg via ORAL
  Filled 2023-02-07 (×4): qty 1

## 2023-02-07 MED ORDER — LOSARTAN 50 MG TABLET
50.0000 mg | ORAL_TABLET | Freq: Two times a day (BID) | ORAL | Status: DC
Start: 2023-02-07 — End: 2023-02-16

## 2023-02-07 MED ORDER — SODIUM BICARBONATE 325 MG TABLET
650.0000 mg | ORAL_TABLET | Freq: Two times a day (BID) | ORAL | Status: DC
Start: 2023-02-07 — End: 2023-02-18
  Administered 2023-02-07 – 2023-02-13 (×13): 650 mg via ORAL
  Administered 2023-02-13: 0 mg via ORAL
  Administered 2023-02-14 – 2023-02-18 (×9): 650 mg via ORAL
  Filled 2023-02-07 (×23): qty 2

## 2023-02-07 MED ORDER — ASPIRIN 81 MG CHEWABLE TABLET
81.0000 mg | CHEWABLE_TABLET | Freq: Every day | ORAL | Status: DC
Start: 2023-02-07 — End: 2023-02-18
  Administered 2023-02-07 – 2023-02-12 (×6): 81 mg via ORAL
  Administered 2023-02-13: 0 mg via ORAL
  Administered 2023-02-14 – 2023-02-18 (×5): 81 mg via ORAL
  Filled 2023-02-07 (×12): qty 1

## 2023-02-07 MED ORDER — LIPASE-PROTEASE-AMYLASE 6,000-19,000-30,000 UNIT CAPSULE,DELAYED REL
1.0000 | DELAYED_RELEASE_CAPSULE | Freq: Three times a day (TID) | ORAL | Status: DC
Start: 2023-02-07 — End: 2023-02-18
  Administered 2023-02-07 (×2): 0 via ORAL
  Administered 2023-02-08: 1 via ORAL
  Administered 2023-02-08 (×2): 0 via ORAL
  Administered 2023-02-09 – 2023-02-12 (×12): 1 via ORAL
  Administered 2023-02-13 (×2): 0 via ORAL
  Administered 2023-02-13: 1 via ORAL
  Administered 2023-02-14: 0 via ORAL
  Administered 2023-02-14: 1 via ORAL
  Administered 2023-02-14: 0 via ORAL
  Administered 2023-02-15 – 2023-02-17 (×9): 1 via ORAL
  Administered 2023-02-18: 0 via ORAL
  Administered 2023-02-18 (×2): 1 via ORAL
  Filled 2023-02-07 (×32): qty 1

## 2023-02-07 MED ORDER — PANTOPRAZOLE 40 MG TABLET,DELAYED RELEASE
40.0000 mg | DELAYED_RELEASE_TABLET | Freq: Every day | ORAL | Status: DC
Start: 2023-02-07 — End: 2023-02-18
  Administered 2023-02-07 – 2023-02-18 (×12): 40 mg via ORAL
  Filled 2023-02-07 (×13): qty 1

## 2023-02-07 MED ORDER — MAGNESIUM HYDROXIDE 400 MG/5 ML ORAL SUSPENSION
15.0000 mL | Freq: Every day | ORAL | Status: DC | PRN
Start: 2023-02-07 — End: 2023-02-18

## 2023-02-07 MED ORDER — GABAPENTIN 100 MG CAPSULE
100.0000 mg | ORAL_CAPSULE | Freq: Two times a day (BID) | ORAL | Status: DC
Start: 2023-02-07 — End: 2023-02-14
  Administered 2023-02-07 – 2023-02-12 (×12): 100 mg via ORAL
  Administered 2023-02-13: 0 mg via ORAL
  Administered 2023-02-13 – 2023-02-14 (×2): 100 mg via ORAL
  Filled 2023-02-07 (×15): qty 1

## 2023-02-07 MED ORDER — HEPARIN (PORCINE) 5,000 UNIT/ML INJECTION SOLUTION
5000.0000 [IU] | Freq: Three times a day (TID) | INTRAMUSCULAR | Status: DC
Start: 2023-02-07 — End: 2023-02-18
  Administered 2023-02-07: 5000 [IU] via SUBCUTANEOUS
  Administered 2023-02-07: 0 [IU] via SUBCUTANEOUS
  Administered 2023-02-08: 5000 [IU] via SUBCUTANEOUS
  Administered 2023-02-08: 0 [IU] via SUBCUTANEOUS
  Administered 2023-02-08 – 2023-02-18 (×30): 5000 [IU] via SUBCUTANEOUS
  Filled 2023-02-07 (×32): qty 1

## 2023-02-07 MED ORDER — SODIUM CHLORIDE 0.9 % INTRAVENOUS SOLUTION
INTRAVENOUS | Status: DC
Start: 2023-02-07 — End: 2023-02-09
  Administered 2023-02-09: 0 mL via INTRAVENOUS

## 2023-02-07 MED ORDER — SODIUM CHLORIDE 0.9 % (FLUSH) INJECTION SYRINGE
3.0000 mL | INJECTION | Freq: Three times a day (TID) | INTRAMUSCULAR | Status: DC
Start: 2023-02-07 — End: 2023-02-18
  Administered 2023-02-07 – 2023-02-10 (×12): 0 mL
  Administered 2023-02-11 (×2): 3 mL
  Administered 2023-02-11: 0 mL
  Administered 2023-02-12 – 2023-02-13 (×4): 3 mL
  Administered 2023-02-13: 0 mL
  Administered 2023-02-13 – 2023-02-14 (×2): 3 mL
  Administered 2023-02-14 (×2): 0 mL
  Administered 2023-02-15: 3 mL
  Administered 2023-02-15 (×2): 0 mL
  Administered 2023-02-16 (×2): 3 mL
  Administered 2023-02-17 (×2): 0 mL
  Administered 2023-02-17 (×2): 3 mL
  Administered 2023-02-18 (×2): 0 mL

## 2023-02-07 MED ORDER — METHOTREXATE SODIUM 2.5 MG TABLET
10.0000 mg | ORAL_TABLET | ORAL | Status: DC
Start: 2023-02-13 — End: 2023-02-09

## 2023-02-07 MED ORDER — HYDRALAZINE 20 MG/ML INJECTION SOLUTION
5.0000 mg | Freq: Four times a day (QID) | INTRAMUSCULAR | Status: DC | PRN
Start: 2023-02-07 — End: 2023-02-08
  Administered 2023-02-08: 5 mg via INTRAVENOUS
  Filled 2023-02-07: qty 1

## 2023-02-07 MED ORDER — ATORVASTATIN 10 MG TABLET
20.0000 mg | ORAL_TABLET | Freq: Every evening | ORAL | Status: DC
Start: 2023-02-07 — End: 2023-02-18
  Administered 2023-02-07 – 2023-02-17 (×11): 20 mg via ORAL
  Filled 2023-02-07 (×11): qty 2

## 2023-02-07 MED ORDER — SODIUM CHLORIDE 0.9 % IV BOLUS
500.0000 mL | INJECTION | Status: AC
Start: 2023-02-07 — End: 2023-02-07
  Administered 2023-02-07: 500 mL via INTRAVENOUS
  Administered 2023-02-07: 0 mL via INTRAVENOUS

## 2023-02-07 MED ORDER — AMIODARONE 200 MG TABLET
200.0000 mg | ORAL_TABLET | Freq: Every day | ORAL | Status: DC
Start: 2023-02-07 — End: 2023-02-18
  Administered 2023-02-07 – 2023-02-12 (×6): 200 mg via ORAL
  Administered 2023-02-13: 0 mg via ORAL
  Administered 2023-02-14 – 2023-02-18 (×5): 200 mg via ORAL
  Filled 2023-02-07 (×12): qty 1

## 2023-02-07 MED ORDER — ONDANSETRON HCL (PF) 4 MG/2 ML INJECTION SOLUTION
4.0000 mg | Freq: Four times a day (QID) | INTRAMUSCULAR | Status: DC | PRN
Start: 2023-02-07 — End: 2023-02-18

## 2023-02-07 MED ORDER — METOPROLOL SUCCINATE ER 50 MG TABLET,EXTENDED RELEASE 24 HR
100.0000 mg | ORAL_TABLET | Freq: Every day | ORAL | Status: DC
Start: 2023-02-07 — End: 2023-02-18
  Administered 2023-02-07 – 2023-02-12 (×6): 100 mg via ORAL
  Administered 2023-02-13: 0 mg via ORAL
  Administered 2023-02-14 – 2023-02-18 (×5): 100 mg via ORAL
  Filled 2023-02-07 (×12): qty 2

## 2023-02-07 MED ORDER — SODIUM CHLORIDE 0.9 % INTRAVENOUS SOLUTION
INTRAVENOUS | Status: DC
Start: 2023-02-07 — End: 2023-02-07

## 2023-02-07 MED ORDER — SERTRALINE 25 MG TABLET
12.5000 mg | ORAL_TABLET | Freq: Every day | ORAL | Status: DC
Start: 2023-02-07 — End: 2023-02-07

## 2023-02-07 MED ORDER — CALCITRIOL 0.25 MCG CAPSULE
0.5000 ug | ORAL_CAPSULE | ORAL | Status: DC
Start: 2023-02-08 — End: 2023-02-18
  Administered 2023-02-08: 0.5 ug via ORAL
  Administered 2023-02-16: 0 ug via ORAL
  Filled 2023-02-07: qty 2

## 2023-02-07 MED ORDER — FOLIC ACID 1 MG TABLET
1.0000 mg | ORAL_TABLET | Freq: Every day | ORAL | Status: DC
Start: 2023-02-07 — End: 2023-02-18
  Administered 2023-02-07 – 2023-02-12 (×6): 1 mg via ORAL
  Administered 2023-02-13: 0 mg via ORAL
  Administered 2023-02-14 – 2023-02-18 (×5): 1 mg via ORAL
  Filled 2023-02-07 (×12): qty 1

## 2023-02-07 MED ORDER — MELATONIN 3 MG TABLET
10.0000 mg | ORAL_TABLET | Freq: Every evening | ORAL | Status: DC
Start: 2023-02-07 — End: 2023-02-16
  Administered 2023-02-07 – 2023-02-15 (×9): 10.5 mg via ORAL
  Filled 2023-02-07 (×9): qty 4

## 2023-02-07 NOTE — ED Nurses Note (Signed)
Patient husband and daughter called to inform them patient is going to be transferred to [facility].

## 2023-02-07 NOTE — Care Plan (Signed)
Problem: Adult Inpatient Plan of Care  Goal: Plan of Care Review  Outcome: Ongoing (see interventions/notes)  Goal: Patient-Specific Goal (Individualized)  Outcome: Ongoing (see interventions/notes)  Goal: Absence of Hospital-Acquired Illness or Injury  Outcome: Ongoing (see interventions/notes)  Intervention: Identify and Manage Fall Risk  Recent Flowsheet Documentation  Taken 02/07/2023 2000 by Leanne Lovely, RN  Safety Promotion/Fall Prevention:   fall prevention program maintained   nonskid shoes/slippers when out of bed   safety round/check completed  Intervention: Prevent Skin Injury  Recent Flowsheet Documentation  Taken 02/07/2023 2000 by Leanne Lovely, RN  Skin Protection: adhesive use limited  Intervention: Prevent Infection  Recent Flowsheet Documentation  Taken 02/07/2023 2000 by Leanne Lovely, RN  Infection Prevention:   promote handwashing   rest/sleep promoted  Goal: Optimal Comfort and Wellbeing  Outcome: Ongoing (see interventions/notes)  Intervention: Provide Person-Centered Care  Recent Flowsheet Documentation  Taken 02/07/2023 2000 by Leanne Lovely, RN  Trust Relationship/Rapport:   care explained   choices provided  Goal: Rounds/Family Conference  Outcome: Ongoing (see interventions/notes)     Problem: Health Knowledge, Opportunity to Enhance (Adult,Obstetrics,Pediatric)  Goal: Knowledgeable about Health Subject/Topic  Description: Patient will demonstrate the desired outcomes by discharge/transition of care.  Outcome: Ongoing (see interventions/notes)  Intervention: Enhance Health Knowledge  Recent Flowsheet Documentation  Taken 02/07/2023 2000 by Leanne Lovely, RN  Family/Support System Care: self-care encouraged     Problem: Skin Injury Risk Increased  Goal: Skin Health and Integrity  Outcome: Ongoing (see interventions/notes)  Intervention: Optimize Skin Protection  Recent Flowsheet Documentation  Taken 02/07/2023 2000 by Leanne Lovely, RN  Pressure Reduction Techniques: Frequent weight  shifting encouraged  Pressure Reduction Devices: Repositioning wedges/pillows utilized  Skin Protection: adhesive use limited     Problem: Oral Intake Inadequate  Goal: Improved Oral Intake  Outcome: Ongoing (see interventions/notes)     Problem: Malnutrition  Goal: Improved Nutritional Intake  Outcome: Ongoing (see interventions/notes)

## 2023-02-07 NOTE — Care Management Notes (Signed)
Received call from patients daughter, Vikki Ports. Spoke with her at length regarding her concerns about patient returning home. She tells me that the patient and her husband live at home alone on [place] acres [place] property. The husband is currently undergoing chemo treatments and is not medically in a position to be caring for his wife. The patient is very deconditioned and malnourished, which Vikki Ports states is her baseline. They do have a caregiver that comes in from 8am-4pm M-F.   Vikki Ports does not feel that it is safe for the patient to return home. I explained to her the difficulty that may be SNF placement. She has Medicare coverage and will require 3 night inpatient stay - patient is only under observation status and I do not feel there is enough to qualify her for inpatient. We discussed option for [facility], though she does not feel this is an appropriate option and her mother would not be able to tolerate the therapy offered. States the patient was there a couple years ago.   I let her know that SNF is not an option without the 3 midnights for Medicare and if they do not want [facility], return home is the only option I can offer. We did discuss HH, as well as additional home resources for increased caregiver support. I emailed her copies of these resources. We also discussed [facility] Medicaid for Medicaid waiver program or LTC SNF placement. Task sent to [facility] to follow up and screen for eligibility. She did tell me that they would like to avoid LTC, as patient prefers to be at home.     Larita Fife, CASE MANAGER

## 2023-02-07 NOTE — H&P (Signed)
History and Physical Exam     Theresa Greene 88 y.o. female  Date of Birth: 1934/10/28  Date of Admit:  02/07/2023   Date of Service: 02/07/2023    Attending: Kathyrn Drown, MD Code Status:No CPR   PCP: Clydia Llano, DO      Chief Complaint:    Generalized Weakness      History of Presenting Illness:   Theresa Greene is an 88 year old female with a past medical history of paroxysmal atrial fibrillation, hypertension, hyperlipidemia, rheumatoid arthritis, CKD stage IIIB who presented as a transfer from [facility]. The patient initially presented to [facility] for generalized weakness. She states that over the last two weeks she has had progressive weakness to the point that she required help to go to the restroom. She also admitted to falling in her bathroom on to the tile floor. She denies any injuries from her fall. She stated that at baseline she is typically able to ambulate with her walker. She also admits to decreased oral intake over this same time period. The patient noted that she was diagnosed with COVID-19 in 2020 and has lost about 50lbs since this time due to decreased appetite and attributes that as a cause of her generalized weakness. She denies a smoking history and states that she is up to date on her cancer screenings. She denies any chest pain, shortness of breath, nausea, vomiting, or diarrhea.     She was hypertensive on arrival to the ED at 134/28mmHg but was otherwise hemodynamically stable. Labs at the outside facility were significant for creatinine 2.36 (baseline approximately 1.1-1.2) and mildly elevated troponins at 31.2 --> 32.3. CXR showed no acute cardiopulmonary process. At the outside facility she received aspirin 324mg , hydralazine IV 10mg , 1.5L NS bolus, and was started on maintenance fluids of NS 100cc/hr. The patient was subsequently transferred to [facility] as [facility] did not have any beds available. She was admitted to the academic medicine team for further  evaluation and management. Repeat labs were ordered upon arrival.     Medications Administered in the ED   magnesium hydroxide (MILK OF MAGNESIA) 400mg  per 5mL oral liquid (has no administration in time range)   ondansetron (ZOFRAN) 2 mg/mL injection (has no administration in time range)   NS flush syringe (has no administration in time range)   NS flush syringe (has no administration in time range)   heparin 5,000 unit/mL injection (has no administration in time range)   acetaminophen (TYLENOL) tablet (has no administration in time range)   hydrALAZINE (APRESOLINE) injection 5 mg (has no administration in time range)   amiodarone (CORDARONE) tablet (has no administration in time range)   amLODIPine (NORVASC) tablet (has no administration in time range)   aspirin chewable tablet 81 mg (has no administration in time range)   atorvastatin (LIPITOR) tablet (has no administration in time range)   calcitriol (ROCALTROL) capsule (has no administration in time range)   pantoprazole (PROTONIX) delayed release tablet (has no administration in time range)   ergocalciferol-vitamin D2 (DRISDOL) capsule (has no administration in time range)   folic acid (FOLVITE) tablet (has no administration in time range)   gabapentin (NEURONTIN) capsule (has no administration in time range)   pancreatic enzyme replacement (CREON) 6,000 units lipase per cap (has no administration in time range)   losartan (COZAAR) tablet (has no administration in time range)   melatonin tablet (has no administration in time range)   methotrexate (TREXALL) tablet (has no administration in time range)  metoprolol succinate (TOPROL-XL) 24 hr extended release tablet (has no administration in time range)   sodium bicarbonate tablet (has no administration in time range)       Review of Systems:   Ten point review of systems otherwise negative    Medical History     PMHx:    Past Medical History:   Diagnosis Date    Arthritis     Asthma     Back problem     CAD S/P  percutaneous coronary angioplasty 2010    Pt reports 5 years ago    Charcot's joint of foot, left 08/06/2014    Colon polyp     CVA (cerebrovascular accident) (CMS HCC) 1995    no residual deficits    Deaf     Diverticulitis of colon     Gastroesophageal reflux disease 08/06/2014    GI bleed     H/O hearing loss     History of cardiac pacemaker 08/06/2014    HTN (hypertension)     Hyperlipidemia 12/25/2019    Muscle weakness     Neuropathy (CMS HCC)     Pacemaker     Paroxysmal atrial fibrillation (CMS HCC) 08/06/2014    PUD (peptic ulcer disease) 08/06/2014    C/b GIB Spring 2015    Rheumatoid arthritis (CMS HCC) 08/06/2014    Stage 3 chronic kidney disease (CMS HCC) 12/25/2019    Last Assessment & Plan:  Renal function stable, at baseline Stable electrolytes; restart sodium bicarb 650 mg BID  Continue renal protective ARB Discussed CKD risk factor modifications, including tight BP/glucose control, low protein diet, and avoidance of NSAIDs    Stented coronary artery     Vitamin D deficiency 12/25/2019    Wears glasses       Allergies:    Allergies   Allergen Reactions    Iodinated Contrast Media Anaphylaxis    Iv Contrast Anaphylaxis     makese her serverly sick    Etodolac NO Steroids unless approved by Attending Physician    Symbicort [Budesonide-Formoterol]     Codeine  Other Adverse Reaction (Add comment) and Nausea/ Vomiting     Stomach pain  Stomach pain       Social History  Social History     Tobacco Use    Smoking status: Never    Smokeless tobacco: Never   Vaping Use    Vaping status: Never Used   Substance Use Topics    Alcohol use: Never    Drug use: Never     Family History  Family Medical History:       Problem Relation (Age of Onset)    Brain cancer Mother    Ovarian Cancer Mother    Pancreatic Cancer Father           Home Meds:   Dexlansoprazole, acetaminophen, amLODIPine, amiodarone, aspirin, atorvastatin, calcitrioL, cephalexin, ergocalciferol (vitamin D2), folic acid, gabapentin,  lipase-protease-amylase, losartan, lysine, melatonin, methotrexate, metoprolol succinate, mirabegron, nitroGLYCERIN, sertraline, and sodium bicarbonate         Objective Findings     Physical Exam:  BP (!) 160/64   Pulse 70   Temp 36.9 C (98.4 F)   Resp 16   Ht 1.6 m (5\' 3" )   Wt 53.1 kg (117 lb)   SpO2 94%   BMI 20.73 kg/m     General: Adult female who appears stated age, no apparent distress, cachetic, chronically ill appearing   HEENT:  Normocephalic/atraumatic  Cardio: RRR, no murmurs heard,  no peripheral edema  Resp: CTA bilaterally with normal respiratory effort  Abd: Soft, NT, ND, Positive BS  Skin: Warm and dry. No lesions or rashes noted.   Neuro: Alert and oriented x3, CN II-XII grossly intact. Decreased muscle strength. No sensory deficits.     Summary of Lab Work and Diagnostic Studies:     CBC   Recent Labs     02/06/23  2221   WBC 4.3   HGB 11.8   HCT 35.9   PLTCNT 274      Chemistries   Recent Labs     02/06/23  2221 02/07/23  0110   SODIUM 140 141   POTASSIUM 3.6 3.3*   CHLORIDE 100 105   CO2 25 23   BUN 52* 48*   CREATININE 2.36* 1.99*   CALCIUM 12.7* 11.6*   MAGNESIUM 2.2  --       Liver Enzymes   No results for input(s): "TOTALPROTEIN", "ALBUMIN", "AST", "ALT", "ALKPHOS", "LIPASE" in the last 72 hours.   Inflammatory Markers   No results for input(s): "CRP", "ESR", "LACTICACID" in the last 72 hours.   PROCALC   Cardiac and Coags   No results for input(s): "UHCEASTTROPI", "BNP", "INR" in the last 72 hours.    Invalid input(s): "PTT", "PT"   Lipid Panel   No results for input(s): "HDL" in the last 72 hours.    Invalid input(s): "LDL", "CHOL"          Assessment & Plan     Active Hospital Problems    Diagnosis    Primary Problem: Elevated troponin    Generalized weakness    Acute kidney injury (CMS HCC)    Stage 3b chronic kidney disease (CMS HCC)    Hyperlipidemia    HTN (hypertension)    Rheumatoid arthritis (CMS HCC)    PUD (peptic ulcer disease)    Paroxysmal atrial fibrillation (CMS  HCC)       Generalized Weakness / Fall  -Patient presented to outside facility due to progressive generalized weakness and fall  -Likely secondary to chronic deconditioning, malnutrition, and poor PO intake   -PT/OT/Speech therapy ordered  -Fall precautions in place     Acute Kidney Injury on Chronic Kidney Disease Stage IIIB- Improving   -Likely secondary to poor PO intake  -Creatinine 2.36 on presentation to outside facility (baseline approximately 1.1-1.2)  -Urine studies ordered  -US kidney ordered  -Continue gentle maintenance fluids   -Avoid nephrotoxic agents     Elevated Troponin  -Likely secondary to acute kidney injury   -Patient reports no chest pain   -Troponins 31.2 --> 32.2  -EKG showed an atrial paced rhythm with a non-specific T wave abnormality   -Continuous telemetry     Paroxysmal Atrial Fibrillation   -Currently rate controlled  -Continue Amiodarone 200mg  daily and Toprol XL 100mg  daily   -Continuous telemetry     Malnutrition   Patient meets criteria based on the following clinical characteristics:   Malnutrition (Undernutrition) Diagnosis  Malnutrition Chronicity: Chronic illness  Malnutrition Severity: Severe protein calorie malnutrition  Related to: Chronic illness  Energy Intake: Less than 75% of EER in greater than or equal to 1 month  Body Fat Loss: Severe  Muscle Mass Loss: Severe  -Nutrition consulted  Nutrition Treatment Plan: Initiate oral supplement, Initiate vitamin/mineral supplement(s), Monitor weight trends  -MNT protocol ordered  -Monitor bowel functions  Lab Results   Component Value Date    ALBUMIN 3.2 (L) 01/23/2023  Hypertension   -Currently hypertensive on arrival  -Restarted home medications- Norvasc 5mg  daily and Toprol XL 100mg  daily  -Home medication- Losartan 50mg  BID - HELD for AKI  -IV Hydralazine ordered PRN for SBP >123mmHg     Hyperlipidemia   -Continue Lipitor 20mg  daily    Rheumatoid Arthritis   -Continue Methotrexate 10mg  weekly     Peptic Ulcer Disease    -Continue Protonix 40mg  daily       Nutrition: DIET CARDIAC   DVT PPx: Heparin 5,000U SQ TID  Code Status: No CPR      Isaias Sakai, DO, PGY-II  Case discussed with attending physician.     I saw and examined the patient on the date of service as noted above. The findings, assessment and plan were discussed with the resident on rounds. I am in agreement with the plan as detailed above and changes made on my part are reflected in the note.   Kathyrn Drown D.O.       This note may have been partially generated using MModal Fluency Direct system, and there may be some incorrect words, spellings, and punctuation that were not noted in checking the note before saving.     End of Note

## 2023-02-07 NOTE — Care Plan (Signed)
Problem: Adult Inpatient Plan of Care  Goal: Plan of Care Review  Outcome: Ongoing (see interventions/notes)     Problem: Oral Intake Inadequate  Goal: Improved Oral Intake  Outcome: Ongoing (see interventions/notes)     Problem: Malnutrition  Goal: Improved Nutritional Intake  Outcome: Ongoing (see interventions/notes)     Medical Nutrition Therapy Assessment        ASSESSMENT:     Consult received to assess needs. 88 YO F with PMH of Afib, HTN, HLD, RA, and CKD 3a who presented as a transfer from [facility] for generalized weakness. She states that over the last 2 weeks she has progressively gotten weaker to the point she can no longer go the restroom without assistance. Pt admits to falling on the bathroom floor and at baseline she can ambulate with a walker. She also report decrease in PO stating that since getting COVID in 2020 she has lost 50# along with decreased appetite. Current weight is 53 kg (117#) with no significant loss noted according to weight records. Labs reviewed K 3.3, BUN/Cr 48/1.99, gfr 24, Ca 11.6. Meds NS @ 50 and VD supplement. Visited pt at bedside who confirmed poor appetite and PO (likely r/t change in taste) since having COVID 4 years ago. UBW is 165#. She is interested in nutrition supplements. Recommend CIB as she does not like Ensure. She presents with spcm r/t poor PO and assessment.     Current Diet Order/Nutrition Support:  DIET CARDIAC     Height Used for Calculations: 160 cm (5' 2.99")  Weight Used For Calculations: 53.1 kg (117 lb)  BMI (kg/m2): 20.77  BMI Assessment: BMI 18.5-24.9: normal  Ideal Body Weight (IBW) (kg): 52.7  % Ideal Body Weight: 100.7     Usual Body Weight: 75 kg (165 lb 5.5 oz)        Physical Assessment:     Body fat loss: Severe in orbital and buccal  Muscle mass wasting: Severe in temples, clavicle, and deltoid    Estimated Needs:    Energy Calorie Requirements: 1590-1855 (30-35 kcal/53kgBW)   Protein Requirements (gms/day): 53-64 (1-1.2 g/53kgBW)   Fluid  Requirements: 1325-1590 (25-30 mL/53kgBW)     NUTRITION DIAGNOSIS: Unspecified severe protein calorie malnutrition related to Current medical condition, Pathophysiological  Increased biological demands for nutrients and Increased energy needs as evidenced by Unintentional weight loss , Decreased oral intake , Vitamin/mineral deficiency     INTERVENTION:     Continue current diet order encouraging PO  Recommend CIB BID to better meet nutritional goals  Pending PO, pt may benefit from an appetite stimulant  Recommend multi-vitamin given malnutrition status  Will monitor weight for changes in nutritional status     GOAL: Consume >50% of all meals and supplements    MONITORING/EVALUATION: RD will continue to follow.    Marvetta Gibbons, RD  02/07/2023, 10:01

## 2023-02-07 NOTE — Care Plan (Signed)
[facility]  Rehabilitation Services  Speech Therapy Initial Swallow and Speech/Language Evaluation    Patient Name: Theresa Greene  Date of Birth: [birth year]  Height: Height: 160 cm (5\' 3" )  Weight: Weight: 53.1 kg (117 lb)  Room/Bed: 328/A  Payor: MEDICARE / Plan: MEDICARE PART A AND B / Product Type: Medicare /     Swallow Assessment:      SLP Swallowing Diagnosis: mild dysphagia, oral dysfunction, esophageal dysfunction - baseline  SLP Diet Recommendation: thin liquids, mechanical soft  Aspiration Precautions: set-up and prepare tray     Recommended Diagnostics:  Barium Esophagram if patient reports symptoms of esophageal dysphagia.    Speech Assessment:     SLP Diagnosis: Speech/language/cognition appropriate for situation    Discharge Needs:   Discharge Disposition: home with assist (will need Justification of D/C recommendation if placement is required.)     Plan:  To provide Swallow Therapy services evaluation only  To provide Speech Therapy services evaluation only   .    The risks and benefits of therapy have been discussed with the patient/caregiver and he/she is in agreement with the established plan of care.    Subjective & Objective:     02/07/23 1127   Therapist Pager   SLP Pager MS/thin   Rehab Session   Document Type evaluation   SLP Visit Date 02/07/23   Total SLP Minutes: 23   Patient Effort good   General Information   Patient Profile Reviewed yes   Onset of Illness/Injury or Date of Surgery 02/06/23   Referring Physician Yetta Barre   Patient/Family/Caregiver Comments/Observations Patient seen in 328. No visitors present - finished OT evaluation. Patient alert and pleasant - talked about history of esophageal dysphagia and how she modifies her foods at home to be softer and has not recently follow up regarding esophageal dilation. No reports of choking. Patient reported taking her pills with cereal.    General Observations of Patient Patient reported poor appetite and unintentional  weight loss - lost taste with COVID. Patient participated appropriately in conversation.    Pertinent History of Current Functional Problem Patient admitted with weakness and elevated troponin. history of esophageal dysphagia   Limitations/Impairments visual;other (see comments)  (blind - right eye)   Mutuality/Individual Preferences   Anxieties, Fears or Concerns no needs voiced at this time   Functional Status Prior   Eating 0 - independent   Communication 0 - understands/communicates without difficulty   Swallowing 0 - swallows foods/liquids without difficulty   Coping/Psychosocial   Observed Emotional State calm;cooperative   Cognitive Assessment/Interventions   Behavior/Mood Observations behavior appropriate to situation, WNL/WFL   Orientation Status oriented to;person;place;situation   Attention WNL/WFL   Follows Commands [facility]   Cognitive Assessment/Intervention   Personal Safety  WNL/WFL   Pragmatics (Communication) WNL   Problem Solving  WNL/WFL   Communication Assessment/Intervention   Additional Documentation Verbal Expression Assess (Group);Auditory Comprehension Assess (Group);Voice Assessment/Intervention (Group)   Auditory Comprehension   Follows Commands (Auditory Comprehension) WNL   Yes/No Questions (Auditory Comprehension) WNL   Verbal Expression   Conversational Speech (Verbal Expression) WNL   Automatic Speech (Verbal Expression) WNL   Narrative Speech (Verbal Expression) WNL   Voice Assessment/Intervention   Quality and Resonance (Voice) WFL   Oxygenation   Oxygenation Room air   Oral Motor Structure and Function    Additional Documentation Oral Motor Structure/Functional Assessment (Group)   Oral Motor Structure/Functional Assessment   Dentition (Oral Motor) upper dentures   Mucosal  Quality (Oral Motor Assessment) good   Oral Musculature (Oral Motor Assessment) WNL   Secretion Management (Oral Motor Assessment) WNL   Non Instrumental/Clinical Swallow (NIS)   Additional Documentation Semi-Solid  Texture Trial (NIS) (Group);Thin Liquid Trial (NIS) (Group);Puree Texture Trial (NIS) (Group)   Thin Liquid Trial (NIS)   Mode of Presentation, Thin Liquid (NIS) straw   Oral Phase Results, Thin Liquid (NIS) intact oral phase without signs of dysfunction   Pharyngeal Phase Results, Thin Liquid (NIS) safe swallow, no signs/symptoms of aspiration or penetration   Puree Texture Trial (NIS)   Oral Phase Results, Puree (NIS) intact oral phase without signs of dysfunction   Pharyngeal Phase Results, Puree (NIS) safe swallow, no signs/symptoms of aspiration or penetration   Semi-Solid Texture Trial (NIS)   Oral Phase Results, Semi-Solid (NIS) intact oral phase without signs of dysfunction   Pharyngeal Phase Results, Semi-Solid (NIS) safe swallow, no signs/symptoms of aspiration or penetration   SLP Clinical Impression   SLP Diagnosis Speech/language/cognition appropriate for situation   Criteria for Skilled Therapeutic Interventions Met (SLP Eval) no problems identified which require skilled intervention;current level of function same as previous level of function   Therapy Frequency (SLP Eval) evaluation only   Swallowing Clinical Impression   SLP Swallowing Diagnosis mild dysphagia;oral dysfunction;esophageal dysfunction   Criteria for Skilled Therap Interv Met current level of function same as previous level of function;no significant expected improvement in functional status   Therapy Frequency evaluation only   SLP Diet Recommendation thin liquids;mechanical soft   Recommended Diagnostics other (see comments)  (Esophagram if patient reports symptoms of esophageal dysphagia.)   Recommended Feeding/Eating Techniques (Swallow Eval) set-up and prepare tray   Anticipated Discharge Disposition home with assist   Plan of care reviewed with: Patient;MD   Applied Cognition Am PAC/6-clicks Score   Following/understand 10-65min speech or presentation 4   Understanding familiar people during ordinary conversations 4   Remembering  to take medications at the appropriate time? 4   Remembering  where things were place/put away 4   Remembering a list of 4/5 errands without writing it down 4   Taking care of complicated tasks (managing a checking acct) 4   Raw Score Total 24   Standardized (t-scale) score 62.21   CMS 0-100% Score 0   CMS Modifier CH       Therapist:  Burnard Leigh, MA, CCC-SLP [place] [id number]   Start Time: 1104  End Time: 1127  Evaluation Time: 23 minutes  Charges Entered: SPSE, SPE  Department Number: [phone]

## 2023-02-07 NOTE — Care Plan (Signed)
[facility]  Rehabilitation Services  Physical Therapy Initial Evaluation    Patient Name: Theresa Greene  Date of Birth: 1934-12-16  Height: Height: 160 cm (5\' 3" )  Weight: Weight: 53.1 kg (117 lb)  Room/Bed: 328/A  Payor: MEDICARE / Plan: MEDICARE PART A AND B / Product Type: Medicare /     Assessment:      (P) Pt.  presents with generalized deconditioning with increase in recent falls. Uses rollator at home. Will benefit from continued PT to maximize safety and functional mobility.    Discharge Needs:    Equipment Recommendation: (P) none anticipated        Discharge Disposition: (P) home with 24/7 assistance, home with home health    JUSTIFICATION OF DISCHARGE RECOMMENDATION   Based on current diagnosis, functional performance prior to admission, and current functional performance, this patient requires continued PT services in (P) home with 24/7 assistance, home with home health in order to achieve significant functional improvements in these deficit areas: (P) aerobic capacity/endurance, gait, locomotion, and balance.        Plan:   Current Intervention: (P) balance training, bed mobility training, gait training, neuromuscular re-education, patient/family education, strengthening, transfer training  To provide physical therapy services (P) minimum of 1x/week  for duration of (P) until discharge.    The risks/benefits of therapy have been discussed with the patient/caregiver and he/she is in agreement with the established plan of care.       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   Pertinent History of Current Functional Problem Transfer from [facility] for Elevated troponins with recent fall. Pt. reports recent hx of 6 falls.   Medical Lines PIV Line;Telemetry   Respiratory Status room air   Existing Precautions/Restrictions fall precautions;DNR   General Observations of Patient Pt. awake, in bed, willing to participate.   Living Environment   Lives With spouse   Living Arrangements house   Living Environment Comment 3 bedroom home, no steps to enter.   Functional Level Prior   Ambulation 1 - assistive equipment   Transferring 1 - assistive equipment   Prior Functional Level Comment Uses rollator for mobility, uses cane occasionally.   Pre Treatment Status   Pre Treatment Patient Status Patient supine in bed;Call light within reach;Telephone within reach;Nurse approved session   Support Present Pre Treatment  None   Communication Pre Treatment  Nurse   Communication Pre Treatment Comment Chloey, RN cleared for eval   Cognitive Assessment/Interventions   Behavior/Mood Observations behavior appropriate to situation, WNL/WFL   Orientation Status oriented x 3   Attention WNL/WFL   Follows Commands WFL   Pain Assessment   Pre/Posttreatment Pain Comment Denies pain   RLE Assessment   RLE Assessment X-Exceptions   RLE Strength generalized weakness   LLE Assessment   LLE Assessment X-Exceptions   LLE Strength generailized weakness   Bed Mobility Assessment/Treatment   Supine-Sit Independence minimum assist (75% patient effort)   Sit to Supine, Independence contact guard assist   Transfer Assessment/Treatment   Sit-Stand Independence contact guard assist   Stand-Sit  Independence contact guard assist   Sit-Stand-Sit, Assist Device walker, front wheeled   Gait Assessment/Treatment   Total Distance Ambulated 20   Independence  contact guard assist   Assistive Device  walker, front wheeled   Comment Transport arrived to take pt. off the floor for testing, limiting distance/session.   Post Treatment Status   Post Treatment Patient Status Other (See comments)  (On cot,  leaving with tranposrt)   Plan of Care Review   Plan Of Care Reviewed With patient   Basic Mobility Am-PAC/6Clicks Score (APPROVED Staff)   Turning in bed without bedrails 3   Lying on back to sitting on edge of flat bed 3   Moving to and from a bed to a chair 3   Standing up from chair 3   Walk in room 3   Climbing 3-5 steps with railing 3   6 Clicks Raw Score total 18   Standardized (t-scale) score 41.05   Physical Therapy Clinical Impression   Assessment Pt.  presents with generalized deconditioning with increase in recent falls. Uses rollator at home. Will benefit from continued PT to maximize safety and functional mobility.   Patient/Family Goals Statement to decrease falls   Criteria for Skilled Therapeutic meets criteria   Impairments Found (describe specific impairments) aerobic capacity/endurance;gait, locomotion, and balance   Rehab Potential good   Therapy Frequency minimum of 1x/week   Predicted Duration of Therapy Intervention (days/wks) until discharge   Anticipated Equipment Needs at Discharge (PT) none anticipated   Anticipated Discharge Disposition home with 24/7 assistance;home with home health   Evaluation Complexity Justification   Patient History: Co-morbidity/factors that impact Plan of Care 1-2 that impact Plan of Care   Examination Components 1-2 Exam elements addressed   Presentation Stable: Uncomplicated, straight-forward, problem focused   Clinical Decision Making Low complexity   Evaluation Complexity Low complexity   Care Plan Goals   PT Rehab Goals Physical Therapy Goal;Bed Mobility Goal;Gait Training Goal;Transfer Training Goal   Physical Therapy Goal   PT  Goal, Date Established 02/07/23   PT Goal, Time to Achieve by discharge   PT Goal, Activity Type Pt. will participate in LE exercises to improve functional mobility, balance.   Bed Mobility Goal   Bed Mobility Goal, Date Established 02/07/23   Bed Mobility Goal, Time to Achieve by discharge   Bed Mobility Goal, Activity Type roll  left/roll right;supine to sit/sit to supine   Bed Mobility Goal, Independence Level modified independence   Gait Training  Goal, Distance to Achieve   Gait Training  Goal, Date Established 02/07/23   Gait Training  Goal, Time to Achieve by discharge   Gait Training  Goal, Independence Level stand-by assistance   Gait Training  Goal, Assist Device least restricted assistive device   Gait Training  Goal, Distance to Achieve >/=164ft   Transfer Training Goal   Transfer Training Goal, Date Established 02/07/23   Transfer Training Goal, Time to Achieve by discharge   Transfer Training Goal, Activity Type bed-to-chair/chair-to-bed;sit-to-stand/stand-to-sit   Transfer Training Goal, Independence Level modified independence   Transfer Training Goal, Assist Device least restrictrictive assistive device   Planned Therapy Interventions, PT Eval   Planned Therapy Interventions (PT) balance training;bed mobility training;gait training;neuromuscular re-education;patient/family education;strengthening;transfer training       Therapist:   Salome Spotted, MPT [id number]    Start Time: 1000  End Time: 1011  Evaluation Time: 11 minutes  Charges Entered: Eval  Department Number: [id number]

## 2023-02-07 NOTE — ED Nurses Note (Signed)
Report to Blasin RN at this time

## 2023-02-07 NOTE — ED Nurses Note (Signed)
Report given to [facility], patient being loaded for transport at this time.

## 2023-02-07 NOTE — Care Plan (Signed)
[facility]  Rehabilitation Services  Occupational Therapy Initial Evaluation    Patient Name: Theresa Greene  Date of Birth: 11/15/1934  Height: Height: 160 cm (5\' 3" )  Weight: Weight: 53.1 kg (117 lb)  Room/Bed: 328/A  Payor: MEDICARE / Plan: MEDICARE PART A AND B / Product Type: Medicare /     Assessment:   Pt is a 88 y/o female admitted w/ elevated troponin, recent fall. Pt limited by weakness, impaired activity tolerance, and impaired balance. Pt may benefit from continued OT to improve IND w/ ADLS.      Discharge Needs:   Equipment Recommendation: to be determined  Discharge Disposition: to be determined    Plan:   Current Intervention: ADL retraining, balance training, bed mobility training, endurance training, strengthening, therapeutic exercise, transfer training    To provide Occupational therapy services minimum of 1x/week, until discharge, until goals are met.       The risks/benefits of therapy have been discussed with the patient/caregiver and he/she is in agreement with the established plan of care.       Subjective & Objective        02/07/23 1110   Therapist Pager   OT Assigned/ Pager # [phone]   Rehab Session   Document Type evaluation;therapy progress note (daily note)   PT Visit Date 02/07/23   Total OT Minutes: 23   General Information   Patient Profile Reviewed yes   Onset of Illness/Injury or Date of Surgery 02/06/23   Patient/Family/Caregiver Comments/Observations RN cleared for activity. pt supine in bed upon arrival. Agreeable at this time.   Pertinent History of Current Functional Problem Pt is a 88 y/o female admitted w/ elevated troponin, recent fall.   Medical Lines PIV Line;Telemetry   Respiratory Status room air   Existing Precautions/Restrictions fall precautions;DNR;contact isolation   Pre Treatment Status   Pre Treatment Patient Status Patient supine in bed;Call light within reach;Telephone within reach   Support Present Pre Treatment  None   Communication Pre  Treatment  Nurse   Mutuality/Individual Preferences   Individualized Care Needs Goes by [facility] of Care Reviewed With patient   Living Environment   Lives With spouse   Living Arrangements house   Functional Level Prior   Ambulation 1 - assistive equipment   Transferring 1 - assistive equipment   Toileting 1 - assistive equipment   Bathing 3 - assistive equipment and person   Dressing 2 - assistive person   Eating 0 - independent   Prior Functional Level Comment Reports having caregiver assist 5 days per week, 12-5PM, who assists w/ ADLss. Pt ambulates w/ a rollator primarily.   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   Lower Body Dressing Assessment/Training   Independence Level  minimum assist (75% patient effort)   Balance   Sitting Balance: Static good balance   Sitting, Dynamic (Balance) good balance   Sit-to-Stand Balance fair + balance   Standing Balance: Static fair balance   Standing Balance: Dynamic fair balance   Post Treatment Status   Post Treatment Patient  Status Patient supine in bed;Call light within reach;Telephone within reach;Patient safety alarm activated   Support Present Post Treatment  None   Communication Post Treatement Nurse   Care Plan Goals   OT Rehab Goals Occupational Therapy Goal;Occupational Therapy Goal 2;Strength Goal   Occupational Therapy Goals   OT Goal, Date Established 02/07/23   OT Goal, Time to Achieve by discharge   OT Goal, Activity Type Pt will safely complete all BADLs and functional transfers w/ SBA using AE as needed   Occupational Therapy Goal 2   OT Goal, Date Established 02/07/23   OT Goal, Time to Achieve by discharge   OT Goal, Activity Type Pt will demo good standing balance to improve IND w/ ADLs.   Strength Goal   Strength Goal, Date Established 02/07/23   Strength Goal, Time to Achieve by discharge   Strength Goal, Measure to Achieve Pt will improve BIL UE strength to improve IND w/ ADLs.   Planned Therapy Interventions, OT Eval   Planned Therapy Interventions ADL retraining;balance training;bed mobility training;endurance training;strengthening;therapeutic exercise;transfer training   Clinical Impression   OT Diagnosis Decreased IND w/ ADLs   Functional Level at Time of Session Pt is a 88 y/o female admitted w/ elevated troponin, recent fall. Pt limited by weakness, impaired activity tolerance, and impaired balance. Pt may benefit from continued OT to improve IND w/ ADLS.   Criteria for Skilled Therapeutic Interventions Met (OT) yes;meets criteria;skilled treatment is necessary   Rehab Potential good   Therapy Frequency minimum of 1x/week   Predicted Duration of Therapy until discharge;until goals are met   Anticipated Equipment Needs at Discharge to be determined   Anticipated Discharge Disposition to be determined   Daily Activity AM-PAC/6-clicks Score   Putting on/Taking off clothing on lower body 3   Bathing 3   Toileting 3   Putting on/Taking off clothing on upper body 4   Personal grooming 4   Eating Meals 4   Raw Score  Total 21   Standardized (t-scale) Score 44.27   CMS 0-100% Score 32.79   CMS Modifier CJ       Therapist:   Celesta Gentile, MOTR/L     Start Time: 0930  End Time: 1610  Evaluation Time: 23 minutes  Charges Entered: 1 OTE, 1 OT ADL  Department Number: [id number]

## 2023-02-08 ENCOUNTER — Observation Stay (HOSPITAL_COMMUNITY): Payer: Medicare Other | Admitting: Radiology

## 2023-02-08 ENCOUNTER — Other Ambulatory Visit: Payer: Self-pay

## 2023-02-08 LAB — CBC WITH DIFF
BASOPHIL #: <0.1 10*3/uL (ref <=0.20)
BASOPHIL %: 0.4 %
EOSINOPHIL #: 0.19 10*3/uL (ref <=0.50)
EOSINOPHIL %: 3.5 %
HCT: 33.7 % — ABNORMAL LOW (ref 34.8–46.0)
HGB: 11 g/dL — ABNORMAL LOW (ref 11.5–16.0)
IMMATURE GRANULOCYTE #: <0.1 10*3/uL (ref <0.10)
IMMATURE GRANULOCYTE %: 0.6 % (ref 0.0–1.0)
LYMPHOCYTE #: 0.65 10*3/uL — ABNORMAL LOW (ref 1.00–4.80)
LYMPHOCYTE %: 12 %
MCH: 30.8 pg (ref 26.0–32.0)
MCHC: 32.6 g/dL (ref 31.0–35.5)
MCV: 94.4 fL (ref 78.0–100.0)
MONOCYTE #: 0.18 10*3/uL — ABNORMAL LOW (ref 0.20–1.10)
MONOCYTE %: 3.3 %
MPV: 10.6 fL (ref 8.7–12.5)
NEUTROPHIL #: 4.36 10*3/uL (ref 1.50–7.70)
NEUTROPHIL %: 80.2 %
PLATELETS: 218 10*3/uL (ref 150–400)
RBC: 3.57 10*6/uL — ABNORMAL LOW (ref 3.85–5.22)
RDW-CV: 16.3 % — ABNORMAL HIGH (ref 11.5–15.5)
WBC: 5.4 10*3/uL (ref 3.7–11.0)

## 2023-02-08 LAB — MAGNESIUM: MAGNESIUM: 2 mg/dL (ref 1.8–2.6)

## 2023-02-08 LAB — PHOSPHORUS: PHOSPHORUS: 3.6 mg/dL (ref 2.3–4.0)

## 2023-02-08 LAB — BASIC METABOLIC PANEL
ANION GAP: 10 mmol/L (ref 4–13)
BUN/CREA RATIO: 23 — ABNORMAL HIGH (ref 6–22)
BUN: 43 mg/dL — ABNORMAL HIGH (ref 8–25)
CALCIUM: 11 mg/dL — ABNORMAL HIGH (ref 8.6–10.3)
CHLORIDE: 106 mmol/L (ref 96–111)
CO2 TOTAL: 23 mmol/L (ref 23–31)
CREATININE: 1.9 mg/dL — ABNORMAL HIGH (ref 0.60–1.05)
ESTIMATED GFR - FEMALE: 26 mL/min/BSA — ABNORMAL LOW (ref >=60)
GLUCOSE: 82 mg/dL (ref 65–125)
POTASSIUM: 3.4 mmol/L — ABNORMAL LOW (ref 3.5–5.1)
SODIUM: 139 mmol/L (ref 136–145)

## 2023-02-08 MED ORDER — POTASSIUM BICARBONATE-CITRIC ACID 20 MEQ EFFERVESCENT TABLET
40.0000 meq | EFFERVESCENT_TABLET | ORAL | Status: AC
Start: 2023-02-08 — End: 2023-02-08
  Administered 2023-02-08: 40 meq via ORAL
  Filled 2023-02-08: qty 2

## 2023-02-08 NOTE — Care Plan (Signed)
Problem: Adult Inpatient Plan of Care  Goal: Plan of Care Review  Outcome: Ongoing (see interventions/notes)  Goal: Patient-Specific Goal (Individualized)  Outcome: Ongoing (see interventions/notes)  Goal: Absence of Hospital-Acquired Illness or Injury  Outcome: Ongoing (see interventions/notes)  Intervention: Identify and Manage Fall Risk  Recent Flowsheet Documentation  Taken 02/08/2023 2000 by Leanne Lovely, RN  Safety Promotion/Fall Prevention:   fall prevention program maintained   nonskid shoes/slippers when out of bed   safety round/check completed  Intervention: Prevent Skin Injury  Recent Flowsheet Documentation  Taken 02/08/2023 2000 by Leanne Lovely, RN  Skin Protection:   adhesive use limited   incontinence pads utilized  Intervention: Prevent and Manage VTE (Venous Thromboembolism) Risk  Recent Flowsheet Documentation  Taken 02/08/2023 2000 by Leanne Lovely, RN  VTE Prevention/Management: ambulation promoted  Intervention: Prevent Infection  Recent Flowsheet Documentation  Taken 02/08/2023 2000 by Leanne Lovely, RN  Infection Prevention:   equipment surfaces disinfected   promote handwashing   rest/sleep promoted  Goal: Optimal Comfort and Wellbeing  Outcome: Ongoing (see interventions/notes)  Intervention: Provide Person-Centered Care  Recent Flowsheet Documentation  Taken 02/08/2023 2000 by Leanne Lovely, RN  Trust Relationship/Rapport:   care explained   choices provided  Goal: Rounds/Family Conference  Outcome: Ongoing (see interventions/notes)     Problem: Health Knowledge, Opportunity to Enhance (Adult,Obstetrics,Pediatric)  Goal: Knowledgeable about Health Subject/Topic  Description: Patient will demonstrate the desired outcomes by discharge/transition of care.  Outcome: Ongoing (see interventions/notes)  Intervention: Enhance Health Knowledge  Recent Flowsheet Documentation  Taken 02/08/2023 2000 by Leanne Lovely, RN  Family/Support System Care: self-care encouraged     Problem: Skin Injury  Risk Increased  Goal: Skin Health and Integrity  Outcome: Ongoing (see interventions/notes)  Intervention: Optimize Skin Protection  Recent Flowsheet Documentation  Taken 02/08/2023 2000 by Leanne Lovely, RN  Pressure Reduction Techniques: Frequent weight shifting encouraged  Pressure Reduction Devices: Repositioning wedges/pillows utilized  Skin Protection:   adhesive use limited   incontinence pads utilized     Problem: Oral Intake Inadequate  Goal: Improved Oral Intake  Outcome: Ongoing (see interventions/notes)     Problem: Malnutrition  Goal: Improved Nutritional Intake  Outcome: Ongoing (see interventions/notes)

## 2023-02-08 NOTE — Care Plan (Signed)
[facility]  Rehabilitation Services  Physical Therapy Progress Note      Patient Name: Theresa Greene  Date of Birth: March 02, 1935  Height:  160 cm (5\' 3" )  Weight:  53.1 kg (117 lb)  Room/Bed: 328/A  Payor: MEDICARE / Plan: MEDICARE PART A AND B / Product Type: Medicare /     Assessment:        02/08/23 1440   Rehab Session   Document Type therapy progress note (daily note)   PT Visit Date 02/08/23   Total PT Minutes: 16   Patient Effort good   Symptoms Noted During/After Treatment fatigue   Symptoms Noted Comment Weakness   Basic Mobility Am-PAC/6Clicks Score (APPROVED Staff)   Turning in bed without bedrails 3   Lying on back to sitting on edge of flat bed 3   Moving to and from a bed to a chair 3   Standing up from chair 3   Walk in room 3   Climbing 3-5 steps with railing 3   6 Clicks Raw Score total 18   Standardized (t-scale) score 41.05     Fatigues easily. Difficulty completing sit to/from stand x 5 reps due to weakness with increased effort, decreased standing balance, use of Ues to achieve standing. Gait distance limited to 17ft due to fatigue.      D/C Disposition:    SNF    Plan:   Continue to follow patient according to established plan of care.  The risks/benefits of therapy have been discussed with the patient/caregiver and he/she is in agreement with the established plan of care.     Subjective & Objective:     Pt. Cleared for treatment by Dionne Ano, RN. Pt. Willing to participate. Pt. Completed supine to sit with CGA and increased effort required to get to EOB. Sitting EOB to acclimate to upright. Sit to/from stand x 5 reps to improve strength, functional mobility. Gait training with [employer] for upright posture, stepping into [employer] and min A x36ft with fatigue and weakness limiting distance. Pt. Returned to sitting EOB with CGA. Supine with min A. Left with call light in reach, bed alarm set. Encouraged pt. To use call light and have assistance for all OOB mobility.     Therapist:    Salome Spotted, MPT  [id number]    Start Time: 1424  End Time: 1440  PM Treatment Time: 16 minutes    Total Treatment Time: 16 minutes  Charges Entered: GT  Department Number: [id number]

## 2023-02-08 NOTE — Progress Notes (Signed)
Progress Note     Theresa Greene 88 y.o. female  Date of Birth: [birth year]  Date of Admit:  02/07/2023   Date of Service: 02/08/2023    Attending: Kathyrn Drown, MD Code Status:No CPR   PCP: [facility], DO      Summary:  Theresa "Adda Greene is an 88 year old female with a past medical history of paroxysmal atrial fibrillation, hypertension, hyperlipidemia, rheumatoid arthritis, CKD stage IIIB who presented as a transfer from [facility]. The patient initially presented to [facility] for generalized weakness. She stated that over the last two weeks she has had progressive weakness to the point that she required help to go to the restroom. She also admitted to falling in her bathroom on to the tile floor. She denied any injuries from her fall. At baseline she is typically able to ambulate with her walker. She also admitted to decreased oral intake over this same time period. The patient noted that she was diagnosed with COVID-19 in 2020 and has lost about 50lbs since this time due to decreased appetite and attributes that as a cause of her generalized weakness. She denied smoking history and stated that she is up to date on her cancer screenings. She denies any chest pain, shortness of breath, nausea, vomiting, or diarrhea. She was hypertensive on arrival to the ED at 134/30mmHg but was otherwise hemodynamically stable. Labs at the outside facility were significant for creatinine 2.36 (baseline approximately 1.1-1.2) and mildly elevated troponins at 31.2 --> 32.3. CXR showed no acute cardiopulmonary process. At the outside facility she received aspirin 324mg , hydralazine IV 10mg , 1.5L NS bolus, and was started on maintenance fluids of NS 100cc/hr. The patient was subsequently transferred to [facility] as [facility] did not have any beds available. She was admitted to the academic medicine team for further evaluation and management.     Interval History:    No acute events overnight. Patient did become hypoxic  requiring 2L NC but this has been weaned off. Lungs are clear to auscultation. CXR showed no acute cardiopulmonary process. Continue gentle maintenance fluids.       Review of Systems:   Negative unless otherwise stated above.       Objective    Medical History     PMHx:    Past Medical History:   Diagnosis Date    Arthritis     Asthma     Back problem     CAD S/P percutaneous coronary angioplasty 2010    Pt reports 5 years ago    Charcot's joint of foot, left 08/06/2014    Colon polyp     CVA (cerebrovascular accident) (CMS HCC) 1995    no residual deficits    Deaf     Diverticulitis of colon     Gastroesophageal reflux disease 08/06/2014    GI bleed     H/O hearing loss     History of cardiac pacemaker 08/06/2014    HTN (hypertension)     Hyperlipidemia 12/25/2019    Muscle weakness     Neuropathy (CMS HCC)     Pacemaker     Paroxysmal atrial fibrillation (CMS HCC) 08/06/2014    PUD (peptic ulcer disease) 08/06/2014    C/b GIB Spring 2015    Rheumatoid arthritis (CMS HCC) 08/06/2014    Stage 3 chronic kidney disease (CMS HCC) 12/25/2019    Last Assessment & Plan:  Renal function stable, at baseline Stable electrolytes; restart sodium bicarb 650 mg BID  Continue renal protective  ARB Discussed CKD risk factor modifications, including tight BP/glucose control, low protein diet, and avoidance of NSAIDs    Stented coronary artery     Vitamin D deficiency 12/25/2019    Wears glasses       Allergies:    Allergies   Allergen Reactions    Iodinated Contrast Media Anaphylaxis    Iv Contrast Anaphylaxis     makese her serverly sick    Etodolac NO Steroids unless approved by Attending Physician    Symbicort [Budesonide-Formoterol]     Codeine  Other Adverse Reaction (Add comment) and Nausea/ Vomiting     Stomach pain  Stomach pain       Social History  Social History     Tobacco Use    Smoking status: Never    Smokeless tobacco: Never   Vaping Use    Vaping status: Never Used   Substance Use Topics    Alcohol use: Never     Drug use: Never     Family History  Family Medical History:       Problem Relation (Age of Onset)    Brain cancer Mother    Ovarian Cancer Mother    Pancreatic Cancer Father           Home Meds:   Dexlansoprazole, acetaminophen, amLODIPine, amiodarone, aspirin, atorvastatin, calcitrioL, cephalexin, ergocalciferol (vitamin D2), folic acid, gabapentin, lipase-protease-amylase, losartan, lysine, melatonin, methotrexate, metoprolol succinate, mirabegron, nitroGLYCERIN, sertraline, and sodium bicarbonate         Objective Findings     Physical Exam:  BP (!) 154/61   Pulse 70   Temp 37 C (98.6 F)   Resp 18   Ht 1.6 m (5\' 3" )   Wt 53.1 kg (117 lb)   SpO2 97%   BMI 20.73 kg/m     General: Adult female who appears stated age, no apparent distress, cachetic, chronically ill appearing   HEENT:  Normocephalic/atraumatic  Cardio: RRR, no murmurs heard, no peripheral edema  Resp: CTA bilaterally with normal respiratory effort  Abd: Soft, NT, ND, Positive BS  Skin: Warm and dry. No lesions or rashes noted.   Neuro: Alert and oriented x3, CN II-XII grossly intact. Decreased muscle strength. No sensory deficits.     Summary of Lab Work and Diagnostic Studies:     CBC   Recent Labs     02/06/23  2221 02/07/23  1121 02/08/23  0515   WBC 4.3 4.5 5.4   HGB 11.8 11.5 11.0*   HCT 35.9 36.3 33.7*   PLTCNT 274 247 218      Chemistries   Recent Labs     02/06/23  2221 02/07/23  0110 02/07/23  1121 02/08/23  0515   SODIUM 140 141 142 139   POTASSIUM 3.6 3.3* 3.5 3.4*   CHLORIDE 100 105 104 106   CO2 25 23 24 23    BUN 52* 48* 47* 43*   CREATININE 2.36* 1.99* 1.97* 1.90*   CALCIUM 12.7* 11.6* 11.3* 11.0*   ALBUMIN  --   --  2.8*  --    MAGNESIUM 2.2  --  2.0 2.0   PHOSPHORUS  --   --  3.6 3.6      Liver Enzymes   Recent Labs     02/07/23  1121   TOTALPROTEIN 6.0   ALBUMIN 2.8*   AST 21   ALT 10   ALKPHOS 100      Inflammatory Markers   No results for input(s): "CRP", "ESR", "LACTICACID"  in the last 72 hours.   PROCALCITONIN   Cardiac  and Coags   No results for input(s): "UHCEASTTROPI", "BNP", "INR" in the last 72 hours.    Invalid input(s): "PTT", "PT"   Lipid Panel   No results for input(s): "HDL" in the last 72 hours.    Invalid input(s): "LDL", "CHOL"       Results for orders placed or performed during the hospital encounter of 02/07/23 (from the past 24 hour(s))   US KIDNEY     Status: None    Narrative    EXAMINATION: Ultrasound kidneys    HISTORY: Acute kidney injury.      Real-time imaging of the kidneys was performed. The kidneys are somewhat small bilaterally. This may be normal for this patient's size and age. No renal masses are identified. There is no evidence of hydronephrosis or renal calculus on either side. The echotexture of both kidneys is increased. This is consistent with nonspecific medical renal disease.    Right kidney: 7.9 cm  Left kidney: 8.1 cm      Impression    1. Echogenic renal cortex bilaterally consistent with nonspecific medical renal disease.  2. No hydronephrosis or worrisome renal mass.      Radiologist location ID: [id number]              Assessment & Plan     Active Hospital Problems    Diagnosis    Primary Problem: Elevated troponin    Generalized weakness    Acute kidney injury (CMS HCC)    Stage 3b chronic kidney disease (CMS HCC)    Hyperlipidemia    HTN (hypertension)    Rheumatoid arthritis (CMS HCC)    PUD (peptic ulcer disease)    Paroxysmal atrial fibrillation (CMS HCC)       Generalized Weakness / Fall  -Patient presented to outside facility due to progressive generalized weakness and fall  -Likely secondary to chronic deconditioning, malnutrition, and poor PO intake   -PT/OT/Speech therapy ordered  -Fall precautions in place      Acute Kidney Injury on Chronic Kidney Disease Stage IIIB- Improving   -Likely secondary to poor PO intake  -Creatinine 2.36 on presentation to outside facility (baseline approximately 1.1-1.2)  -Urine studies ordered from outside facility   -US kidney showed no evidence of  obstruction   -Continue gentle maintenance fluids   -Avoid nephrotoxic agents     Acute Hypoxic Respiratory Failure- Resolved   -Unknown etiology  -Patient briefly requiring 2L NC (does not require supplemental oxygen at baseline), subsequently weaned off  -CXR showed no acute cardiopulmonary process and lungs are clear to auscultation      Elevated Troponin  -Likely secondary to acute kidney injury   -Patient reports no chest pain   -Troponins peaked at 42.4   -EKG showed an atrial paced rhythm with a non-specific T wave abnormality   -Continuous telemetry      Paroxysmal Atrial Fibrillation   -Currently rate controlled  -Continue Amiodarone 200mg  daily and Toprol XL 100mg  daily   -Continuous telemetry      Protein Calorie Malnutrition   Patient meets criteria based on the following clinical characteristics:   Malnutrition (Undernutrition) Diagnosis  Malnutrition Chronicity: Chronic illness  Malnutrition Severity: Severe protein calorie malnutrition  Related to: Chronic illness  Energy Intake: Less than 75% of EER in greater than or equal to 1 month  Body Fat Loss: Severe  Muscle Mass Loss: Severe  Nutrition consulted  Nutrition Treatment Plan: Initiate  oral supplement, Initiate vitamin/mineral supplement(s), Monitor weight trends  MNT protocol ordered  Monitor bowel functions  Lab Results   Component Value Date    ALBUMIN 2.8 (L) 02/07/2023     Hypertension   -Remains mildly hypertensive   -Restarted home medications- Norvasc 5mg  daily and Toprol XL 100mg  daily  -Home medication- Losartan 50mg  BID - HELD for AKI     Hyperlipidemia   -Continue Lipitor 20mg  daily     Rheumatoid Arthritis   -Continue Methotrexate 10mg  weekly      Peptic Ulcer Disease   -Continue Protonix 40mg  daily       Nutrition: DIETARY ORAL SUPPLEMENTS Product Name: [employer] Breakfast Essentials - Powder - Vanilla; Frequency: BREAKFAST/DINNER; Number of Containers: 1 Each  DIET CARDIAC Consistency/thickening: MECHANICAL SOFT (Ground meat/Soft  foods); Instructional: POSITION AS UPRIGHT AS POSSIBLE (BETWEEN 45-90 DEGREES) UNLESS CONTRAINDICATED   DVT PPx: Heparin 5,000U SQ TID  Code Status: No CPR      Isaias Sakai, DO, PGY-II  Case discussed with attending physician.     I saw and examined the patient on the date of service as noted above. The findings, assessment and plan were discussed with the resident on rounds. I am in agreement with the plan as detailed above and changes made on my part are reflected in the note.   Kathyrn Drown D.O.        This note may have been partially generated using MModal Fluency Direct system, and there may be some incorrect words, spellings, and punctuation that were not noted in checking the note before saving.     End of Note

## 2023-02-08 NOTE — Care Management Notes (Signed)
[facility]  Care Management Initial Evaluation    Patient Name: Theresa Greene  Date of Birth: 10-Oct-1934  Sex: female  Date/Time of Admission: 02/07/2023  7:15 AM  Room/Bed: 328/A  Payor: MEDICARE / Plan: MEDICARE PART A AND B / Product Type: Medicare /   PCP: Clydia Llano, DO    Pharmacy Info:   Preferred Pharmacy       [facility] [id number] - [place], [place] - [address] AT [facility] [address])    [address] [place] [address]    Phone: [phone] Fax: [phone]    Hours: Not open 24 hours    [id number] - [place], [address] - [address]    [address] [place] [address]    Phone: [phone] Fax: [phone]    Hours: Not open 24 hours          Emergency Contact Info:   Extended Emergency Contact Information  Primary Emergency Contact: Laffoon,JAMES  Address: [address], [place] [address] [place]  Home Phone: [phone]  Work Phone: [phone]  Mobile Phone: [phone]  Relation: Husband  Secondary Emergency Contact: ACREE,[name]  Mobile Phone: [phone]  Relation: Daughter  Interpreter needed? No    History:   Tiffy Chen is a 88 y.o., female, admitted inpatient.     Height/Weight: 160 cm (5\' 3" ) / 53.1 kg (117 lb)     LOS: 0 days   Admitting Diagnosis: Elevated troponin [R79.89]    Assessment:      02/08/23 1620   Assessment Details   Assessment Type Admission   Date of Care Management Update 02/08/23   Living Environment   Select an age group to open "lives with" row.  Adult   Lives With spouse   Living Arrangements house   Care Management Plan   Discharge Planning Status initial meeting   Discharge plan discussed with: Patient;Spouse   Discharge Needs Assessment   Equipment Currently Used at Home walker, front wheeled   Discharge Facility/Level of Care Needs SNF Placement (Medicare certified)(code 3)   Referral Information   Admission Type inpatient       Discharge  Plan:  SNF Placement (Medicare certified) (code 3)  Met with patient and husband at bedside to discuss discharge plans. The patient lives at home with her husband on their property. The husband is able to assist with needs and she uses a FWW. She tells me that she is having difficulty walking due to weakness in her legs. She has been to [facility] in the past and states, "they got me walking again." She would like to go there at discharge for rehab before returning home. Task sent to SW.     The patient will continue to be evaluated for developing discharge needs.     Case Manager: Larita Fife, CASE MANAGER  Phone: [phone]

## 2023-02-09 LAB — CBC WITH DIFF
BASOPHIL #: <0.1 10*3/uL (ref <=0.20)
BASOPHIL %: 0.4 %
EOSINOPHIL #: 0.38 10*3/uL (ref <=0.50)
EOSINOPHIL %: 6.7 %
HCT: 34.1 % — ABNORMAL LOW (ref 34.8–46.0)
HGB: 10.8 g/dL — ABNORMAL LOW (ref 11.5–16.0)
IMMATURE GRANULOCYTE #: <0.1 10*3/uL (ref <0.10)
IMMATURE GRANULOCYTE %: 1.2 % — ABNORMAL HIGH (ref 0.0–1.0)
LYMPHOCYTE #: 0.56 10*3/uL — ABNORMAL LOW (ref 1.00–4.80)
LYMPHOCYTE %: 9.8 %
MCH: 30 pg (ref 26.0–32.0)
MCHC: 31.7 g/dL (ref 31.0–35.5)
MCV: 94.7 fL (ref 78.0–100.0)
MONOCYTE #: 0.42 10*3/uL (ref 0.20–1.10)
MONOCYTE %: 7.4 %
MPV: 10.9 fL (ref 8.7–12.5)
NEUTROPHIL #: 4.25 10*3/uL (ref 1.50–7.70)
NEUTROPHIL %: 74.5 %
PLATELETS: 203 10*3/uL (ref 150–400)
RBC: 3.6 10*6/uL — ABNORMAL LOW (ref 3.85–5.22)
RDW-CV: 16.5 % — ABNORMAL HIGH (ref 11.5–15.5)
WBC: 5.7 10*3/uL (ref 3.7–11.0)

## 2023-02-09 LAB — BASIC METABOLIC PANEL
ANION GAP: 8 mmol/L (ref 4–13)
BUN/CREA RATIO: 18 (ref 6–22)
BUN: 32 mg/dL — ABNORMAL HIGH (ref 8–25)
CALCIUM: 11.2 mg/dL — ABNORMAL HIGH (ref 8.6–10.3)
CHLORIDE: 104 mmol/L (ref 96–111)
CO2 TOTAL: 24 mmol/L (ref 23–31)
CREATININE: 1.8 mg/dL — ABNORMAL HIGH (ref 0.60–1.05)
ESTIMATED GFR - FEMALE: 27 mL/min/BSA — ABNORMAL LOW (ref >=60)
GLUCOSE: 113 mg/dL (ref 65–125)
POTASSIUM: 3.2 mmol/L — ABNORMAL LOW (ref 3.5–5.1)
SODIUM: 136 mmol/L (ref 136–145)

## 2023-02-09 LAB — PHOSPHORUS: PHOSPHORUS: 2.8 mg/dL (ref 2.3–4.0)

## 2023-02-09 LAB — MAGNESIUM: MAGNESIUM: 1.9 mg/dL (ref 1.8–2.6)

## 2023-02-09 LAB — ALBUMIN: ALBUMIN: 2.3 g/dL — ABNORMAL LOW (ref 3.4–4.8)

## 2023-02-09 LAB — PARATHYROID HORMONE (PTH): PTH: 7.1 pg/mL — ABNORMAL LOW (ref 8.5–77.0)

## 2023-02-09 MED ORDER — HYDRALAZINE 20 MG/ML INJECTION SOLUTION
10.0000 mg | Freq: Four times a day (QID) | INTRAMUSCULAR | Status: AC | PRN
Start: 2023-02-09 — End: 2023-02-10
  Administered 2023-02-09 – 2023-02-10 (×2): 10 mg via INTRAVENOUS
  Filled 2023-02-09 (×3): qty 1

## 2023-02-09 MED ORDER — ARTIFICIAL TEARS(DEXTRAN-HYPROMEL-GLYCERN) 0.1 %-0.3 %-0.2 % EYE DROPS
2.0000 [drp] | OPHTHALMIC | Status: DC | PRN
Start: 2023-02-09 — End: 2023-02-18
  Administered 2023-02-09: 2 [drp] via OPHTHALMIC
  Filled 2023-02-09: qty 15

## 2023-02-09 MED ORDER — POTASSIUM CHLORIDE ER 20 MEQ TABLET,EXTENDED RELEASE(PART/CRYST)
40.0000 meq | ORAL_TABLET | ORAL | Status: AC
Start: 2023-02-09 — End: 2023-02-09
  Administered 2023-02-09: 40 meq via ORAL
  Filled 2023-02-09: qty 2

## 2023-02-09 MED ORDER — SODIUM CHLORIDE 0.9 % INTRAVENOUS SOLUTION
INTRAVENOUS | Status: DC
Start: 2023-02-09 — End: 2023-02-17
  Administered 2023-02-11: 0 mL via INTRAVENOUS

## 2023-02-09 MED ORDER — MEGESTROL 400 MG/10 ML (10 ML) ORAL SUSPENSION
800.0000 mg | Freq: Every day | ORAL | Status: DC
Start: 2023-02-09 — End: 2023-02-18
  Administered 2023-02-09 – 2023-02-12 (×4): 800 mg via ORAL
  Administered 2023-02-13: 0 mg via ORAL
  Administered 2023-02-14 – 2023-02-18 (×5): 800 mg via ORAL
  Filled 2023-02-09 (×11): qty 20

## 2023-02-09 MED ORDER — METHOTREXATE SODIUM 2.5 MG TABLET
10.0000 mg | ORAL_TABLET | ORAL | Status: DC
Start: 2023-02-09 — End: 2023-02-18
  Administered 2023-02-09 – 2023-02-16 (×2): 10 mg via ORAL
  Filled 2023-02-09 (×3): qty 4

## 2023-02-09 NOTE — Progress Notes (Signed)
Progress Note     Theresa Greene 88 y.o. female  Date of Birth: [birth year]  Date of Admit:  02/07/2023   Date of Service: 02/09/2023    Attending: Kathyrn Drown, MD Code Status:No CPR   PCP: [facility], DO      Summary:  Theresa "Retaj Greene is an 88 year old female with a past medical history of paroxysmal atrial fibrillation, hypertension, hyperlipidemia, rheumatoid arthritis, CKD stage IIIB who presented as a transfer from [facility]. The patient initially presented to [facility] for generalized weakness. She stated that over the last two weeks she has had progressive weakness to the point that she required help to go to the restroom. She also admitted to falling in her bathroom on to the tile floor. She denied any injuries from her fall. At baseline she is typically able to ambulate with her walker. She also admitted to decreased oral intake over this same time period. The patient noted that she was diagnosed with COVID-19 in 2020 and has lost about 50lbs since this time due to decreased appetite and attributes that as a cause of her generalized weakness. She denied smoking history and stated that she is up to date on her cancer screenings. She denies any chest pain, shortness of breath, nausea, vomiting, or diarrhea. She was hypertensive on arrival to the ED at 134/38mmHg but was otherwise hemodynamically stable. Labs at the outside facility were significant for creatinine 2.36 (baseline approximately 1.1-1.2) and mildly elevated troponins at 31.2 --> 32.3. CXR showed no acute cardiopulmonary process. At the outside facility she received aspirin 324mg , hydralazine IV 10mg , 1.5L NS bolus, and was started on maintenance fluids of NS 100cc/hr. The patient was subsequently transferred to [facility] as [facility] did not have any beds available. She was admitted to the academic medicine team for further evaluation and management.     Interval History:    No acute events overnight.  Patient currently on room  air.  Corrected calcium level 12.4.  We will increase fluid rate.Megace started. Calcitriol held.PTHRP ordered.     Review of Systems:   Negative unless otherwise stated above.       Objective    Medical History     PMHx:    Past Medical History:   Diagnosis Date    Arthritis     Asthma     Back problem     CAD S/P percutaneous coronary angioplasty 2010    Pt reports 5 years ago    Charcot's joint of foot, left 08/06/2014    Colon polyp     CVA (cerebrovascular accident) (CMS HCC) 1995    no residual deficits    Deaf     Diverticulitis of colon     Gastroesophageal reflux disease 08/06/2014    GI bleed     H/O hearing loss     History of cardiac pacemaker 08/06/2014    HTN (hypertension)     Hyperlipidemia 12/25/2019    Muscle weakness     Neuropathy (CMS HCC)     Pacemaker     Paroxysmal atrial fibrillation (CMS HCC) 08/06/2014    PUD (peptic ulcer disease) 08/06/2014    C/b GIB Spring 2015    Rheumatoid arthritis (CMS HCC) 08/06/2014    Stage 3 chronic kidney disease (CMS HCC) 12/25/2019    Last Assessment & Plan:  Renal function stable, at baseline Stable electrolytes; restart sodium bicarb 650 mg BID  Continue renal protective ARB Discussed CKD risk factor modifications, including tight BP/glucose  control, low protein diet, and avoidance of NSAIDs    Stented coronary artery     Vitamin D deficiency 12/25/2019    Wears glasses       Allergies:    Allergies   Allergen Reactions    Iodinated Contrast Media Anaphylaxis    Iv Contrast Anaphylaxis     makese her serverly sick    Etodolac NO Steroids unless approved by Attending Physician    Symbicort [Budesonide-Formoterol]     Codeine  Other Adverse Reaction (Add comment) and Nausea/ Vomiting     Stomach pain  Stomach pain       Social History  Social History     Tobacco Use    Smoking status: Never    Smokeless tobacco: Never   Vaping Use    Vaping status: Never Used   Substance Use Topics    Alcohol use: Never    Drug use: Never     Family History  Family Medical  History:       Problem Relation (Age of Onset)    Brain cancer Mother    Ovarian Cancer Mother    Pancreatic Cancer Father           Home Meds:   Dexlansoprazole, acetaminophen, amLODIPine, amiodarone, aspirin, atorvastatin, calcitrioL, cephalexin, ergocalciferol (vitamin D2), folic acid, gabapentin, lipase-protease-amylase, losartan, lysine, melatonin, methotrexate, metoprolol succinate, mirabegron, nitroGLYCERIN, sertraline, and sodium bicarbonate         Objective Findings     Physical Exam:  BP (!) 141/77   Pulse 70   Temp 36.8 C (98.2 F)   Resp 18   Ht 1.6 m (5\' 3" )   Wt 53.1 kg (117 lb)   SpO2 95%   BMI 20.73 kg/m     General: Adult female who appears stated age, no apparent distress, cachetic, chronically ill appearing   HEENT:  Normocephalic/atraumatic  Cardio: RRR, no murmurs heard, no peripheral edema  Resp: CTA bilaterally with normal respiratory effort  Abd: Soft, NT, ND, Positive BS  Skin: Warm and dry. No lesions or rashes noted.   Neuro: Alert and oriented x3, CN II-XII grossly intact. Decreased muscle strength. No sensory deficits.     Summary of Lab Work and Diagnostic Studies:     CBC   Recent Labs     02/07/23  1121 02/08/23  0515 02/09/23  0635   WBC 4.5 5.4 5.7   HGB 11.5 11.0* 10.8*   HCT 36.3 33.7* 34.1*   PLTCNT 247 218 203      Chemistries   Recent Labs     02/07/23  1121 02/08/23  0515 02/09/23  0635   SODIUM 142 139 136   POTASSIUM 3.5 3.4* 3.2*   CHLORIDE 104 106 104   CO2 24 23 24    BUN 47* 43* 32*   CREATININE 1.97* 1.90* 1.80*   CALCIUM 11.3* 11.0* 11.2*   ALBUMIN 2.8*  --  2.3*   MAGNESIUM 2.0 2.0 1.9   PHOSPHORUS 3.6 3.6 2.8      Liver Enzymes   Recent Labs     02/07/23  1121 02/09/23  0635   TOTALPROTEIN 6.0  --    ALBUMIN 2.8* 2.3*   AST 21  --    ALT 10  --    ALKPHOS 100  --       Inflammatory Markers   No results for input(s): "CRP", "ESR", "LACTICACID" in the last 72 hours.   PROCALCITONIN   Cardiac and Coags   No  results for input(s): "UHCEASTTROPI", "BNP", "INR" in  the last 72 hours.    Invalid input(s): "PTT", "PT"   Lipid Panel   No results for input(s): "HDL" in the last 72 hours.    Invalid input(s): "LDL", "CHOL"                   Assessment & Plan     Active Hospital Problems    Diagnosis    Primary Problem: Elevated troponin    Generalized weakness    Acute kidney injury (CMS HCC)    Stage 3b chronic kidney disease (CMS HCC)    Hyperlipidemia    HTN (hypertension)    Rheumatoid arthritis (CMS HCC)    PUD (peptic ulcer disease)    Paroxysmal atrial fibrillation (CMS HCC)         Generalized Weakness / Fall  -Patient presented to outside facility due to progressive generalized weakness and fall  -Likely secondary to chronic deconditioning, malnutrition, and poor PO intake   -PT/OT/Speech therapy ordered  -Fall precautions in place     Hypercalcemia  Possible cause medications?, malignancy   Corrected calcium 12.4   Monoclonal gammopathy work up ordered   PTH, PTHRP ordered   IV fluids 100 cc/hr   Trend calcium levels   Calcitriol held     Acute Kidney Injury on Chronic Kidney Disease Stage IIIB- Improving   -Likely secondary to poor PO intake  -Creatinine 2.36 on presentation to outside facility (baseline approximately 1.1-1.2)  -US kidney showed no evidence of obstruction   -Continue gentle maintenance fluids   -Avoid nephrotoxic agents     Acute Hypoxic Respiratory Failure- Resolved   -Unknown etiology  -Patient briefly requiring 2L NC (does not require supplemental oxygen at baseline), subsequently weaned off  -CXR showed no acute cardiopulmonary process and lungs are clear to auscultation      Elevated Troponin  -Likely secondary to acute kidney injury   -Patient reports no chest pain   -Troponins peaked at 42.4   -EKG showed an atrial paced rhythm with a non-specific T wave abnormality   -Continuous telemetry      Paroxysmal Atrial Fibrillation   -Currently rate controlled  -Continue Amiodarone 200mg  daily and Toprol XL 100mg  daily   -Continuous telemetry       Protein Calorie Malnutrition   Patient meets criteria based on the following clinical characteristics:   Malnutrition (Undernutrition) Diagnosis  Malnutrition Chronicity: Chronic illness  Malnutrition Severity: Severe protein calorie malnutrition  Related to: Chronic illness  Energy Intake: Less than 75% of EER in greater than or equal to 1 month  Body Fat Loss: Severe  Muscle Mass Loss: Severe  Nutrition consulted  Nutrition Treatment Plan: Initiate oral supplement, Initiate vitamin/mineral supplement(s), Monitor weight trends  MNT protocol ordered  Monitor bowel functions  Lab Results   Component Value Date    ALBUMIN 2.3 (L) 02/09/2023     Hypertension   -Remains mildly hypertensive   -Restarted home medications- Norvasc 5mg  daily and Toprol XL 100mg  daily  -Home medication- Losartan 50mg  BID - HELD for AKI     Hyperlipidemia   -Continue Lipitor 20mg  daily     Rheumatoid Arthritis   -Continue Methotrexate 10mg  weekly      Peptic Ulcer Disease   -Continue Protonix 40mg  daily       Nutrition: DIETARY ORAL SUPPLEMENTS Product Name: [employer] Breakfast Essentials - Powder - Vanilla; Frequency: BREAKFAST/DINNER; Number of Containers: 1 Each  DIET CARDIAC Consistency/thickening: MECHANICAL SOFT (Ground meat/Soft  foods); Instructional: POSITION AS UPRIGHT AS POSSIBLE (BETWEEN 45-90 DEGREES) UNLESS CONTRAINDICATED   DVT PPx: Heparin 5,000U SQ TID  Code Status: No CPR      Isaias Sakai, DO, PGY-II  Case discussed with attending physician.     I saw and examined the patient on the date of service as noted above. The findings, assessment and plan were discussed with the resident on rounds. I am in agreement with the plan as detailed above and changes made on my part are reflected in the note.   Kathyrn Drown D.O.        This note may have been partially generated using MModal Fluency Direct system, and there may be some incorrect words, spellings, and punctuation that were not noted in checking the note before  saving.     End of Note

## 2023-02-09 NOTE — Care Plan (Signed)
Problem: Adult Inpatient Plan of Care  Goal: Plan of Care Review  Outcome: Ongoing (see interventions/notes)  Goal: Patient-Specific Goal (Individualized)  Outcome: Ongoing (see interventions/notes)  Goal: Absence of Hospital-Acquired Illness or Injury  Outcome: Ongoing (see interventions/notes)  Intervention: Identify and Manage Fall Risk  Recent Flowsheet Documentation  Taken 02/09/2023 2000 by Leanne Lovely, RN  Safety Promotion/Fall Prevention:   fall prevention program maintained   nonskid shoes/slippers when out of bed   safety round/check completed  Intervention: Prevent Skin Injury  Recent Flowsheet Documentation  Taken 02/09/2023 2000 by Leanne Lovely, RN  Skin Protection:   adhesive use limited   incontinence pads utilized  Intervention: Prevent and Manage VTE (Venous Thromboembolism) Risk  Recent Flowsheet Documentation  Taken 02/09/2023 2000 by Leanne Lovely, RN  VTE Prevention/Management: ambulation promoted  Intervention: Prevent Infection  Recent Flowsheet Documentation  Taken 02/09/2023 2000 by Leanne Lovely, RN  Infection Prevention:   equipment surfaces disinfected   promote handwashing   rest/sleep promoted  Goal: Optimal Comfort and Wellbeing  Outcome: Ongoing (see interventions/notes)  Intervention: Provide Person-Centered Care  Recent Flowsheet Documentation  Taken 02/09/2023 2000 by Leanne Lovely, RN  Trust Relationship/Rapport:   care explained   choices provided  Goal: Rounds/Family Conference  Outcome: Ongoing (see interventions/notes)     Problem: Health Knowledge, Opportunity to Enhance (Adult,Obstetrics,Pediatric)  Goal: Knowledgeable about Health Subject/Topic  Description: Patient will demonstrate the desired outcomes by discharge/transition of care.  Outcome: Ongoing (see interventions/notes)  Intervention: Enhance Health Knowledge  Recent Flowsheet Documentation  Taken 02/09/2023 2000 by Leanne Lovely, RN  Family/Support System Care: self-care encouraged     Problem: Skin Injury  Risk Increased  Goal: Skin Health and Integrity  Outcome: Ongoing (see interventions/notes)  Intervention: Optimize Skin Protection  Recent Flowsheet Documentation  Taken 02/09/2023 2000 by Leanne Lovely, RN  Pressure Reduction Techniques: Frequent weight shifting encouraged  Pressure Reduction Devices: Repositioning wedges/pillows utilized  Skin Protection:   adhesive use limited   incontinence pads utilized     Problem: Oral Intake Inadequate  Goal: Improved Oral Intake  Outcome: Ongoing (see interventions/notes)     Problem: Malnutrition  Goal: Improved Nutritional Intake  Outcome: Ongoing (see interventions/notes)

## 2023-02-10 DIAGNOSIS — W19XXXA Unspecified fall, initial encounter: Secondary | ICD-10-CM

## 2023-02-10 DIAGNOSIS — Y92002 Bathroom of unspecified non-institutional (private) residence single-family (private) house as the place of occurrence of the external cause: Secondary | ICD-10-CM

## 2023-02-10 LAB — CBC WITH DIFF
BASOPHIL #: <0.1 10*3/uL (ref <=0.20)
BASOPHIL %: 0.4 %
EOSINOPHIL #: 0.45 10*3/uL (ref <=0.50)
EOSINOPHIL %: 6.5 %
HCT: 35.1 % (ref 34.8–46.0)
HGB: 11.5 g/dL (ref 11.5–16.0)
IMMATURE GRANULOCYTE #: <0.1 10*3/uL (ref <0.10)
IMMATURE GRANULOCYTE %: 1.3 % — ABNORMAL HIGH (ref 0.0–1.0)
LYMPHOCYTE #: 0.64 10*3/uL — ABNORMAL LOW (ref 1.00–4.80)
LYMPHOCYTE %: 9.2 %
MCH: 31 pg (ref 26.0–32.0)
MCHC: 32.8 g/dL (ref 31.0–35.5)
MCV: 94.6 fL (ref 78.0–100.0)
MONOCYTE #: 0.81 10*3/uL (ref 0.20–1.10)
MONOCYTE %: 11.7 %
MPV: 11.3 fL (ref 8.7–12.5)
NEUTROPHIL #: 4.92 10*3/uL (ref 1.50–7.70)
NEUTROPHIL %: 70.9 %
PLATELETS: 205 10*3/uL (ref 150–400)
RBC: 3.71 10*6/uL — ABNORMAL LOW (ref 3.85–5.22)
RDW-CV: 16.9 % — ABNORMAL HIGH (ref 11.5–15.5)
WBC: 6.9 10*3/uL (ref 3.7–11.0)

## 2023-02-10 LAB — BASIC METABOLIC PANEL
ANION GAP: 8 mmol/L (ref 4–13)
BUN/CREA RATIO: 18 (ref 6–22)
BUN: 28 mg/dL — ABNORMAL HIGH (ref 8–25)
CALCIUM: 11.2 mg/dL — ABNORMAL HIGH (ref 8.6–10.3)
CHLORIDE: 105 mmol/L (ref 96–111)
CO2 TOTAL: 24 mmol/L (ref 23–31)
CREATININE: 1.57 mg/dL — ABNORMAL HIGH (ref 0.60–1.05)
ESTIMATED GFR - FEMALE: 32 mL/min/BSA — ABNORMAL LOW (ref >=60)
GLUCOSE: 104 mg/dL (ref 65–125)
POTASSIUM: 3.7 mmol/L (ref 3.5–5.1)
SODIUM: 137 mmol/L (ref 136–145)

## 2023-02-10 LAB — MAGNESIUM: MAGNESIUM: 1.7 mg/dL — ABNORMAL LOW (ref 1.8–2.6)

## 2023-02-10 MED ORDER — AMLODIPINE 10 MG TABLET
10.0000 mg | ORAL_TABLET | Freq: Every day | ORAL | Status: DC
Start: 2023-02-10 — End: 2023-02-18
  Administered 2023-02-10 – 2023-02-12 (×3): 10 mg via ORAL
  Administered 2023-02-13: 0 mg via ORAL
  Administered 2023-02-14 – 2023-02-18 (×5): 10 mg via ORAL
  Filled 2023-02-10 (×9): qty 1

## 2023-02-10 MED ORDER — MAGNESIUM SULFATE 2 GRAM/50 ML (4 %) IN WATER INTRAVENOUS PIGGYBACK
2.0000 g | INJECTION | Freq: Once | INTRAVENOUS | Status: AC
Start: 2023-02-10 — End: 2023-02-10
  Administered 2023-02-10: 0 g via INTRAVENOUS
  Administered 2023-02-10: 2 g via INTRAVENOUS
  Filled 2023-02-10: qty 50

## 2023-02-10 NOTE — Care Plan (Signed)
Problem: Adult Inpatient Plan of Care  Goal: Plan of Care Review  Outcome: Ongoing (see interventions/notes)  Goal: Patient-Specific Goal (Individualized)  Outcome: Ongoing (see interventions/notes)  Goal: Absence of Hospital-Acquired Illness or Injury  Outcome: Ongoing (see interventions/notes)  Intervention: Identify and Manage Fall Risk  Recent Flowsheet Documentation  Taken 02/10/2023 2000 by Leanne Lovely, RN  Safety Promotion/Fall Prevention:   fall prevention program maintained   nonskid shoes/slippers when out of bed   safety round/check completed  Intervention: Prevent Skin Injury  Recent Flowsheet Documentation  Taken 02/10/2023 2000 by Leanne Lovely, RN  Skin Protection:   adhesive use limited   incontinence pads utilized  Intervention: Prevent and Manage VTE (Venous Thromboembolism) Risk  Recent Flowsheet Documentation  Taken 02/10/2023 2000 by Leanne Lovely, RN  VTE Prevention/Management: ambulation promoted  Intervention: Prevent Infection  Recent Flowsheet Documentation  Taken 02/10/2023 2000 by Leanne Lovely, RN  Infection Prevention:   equipment surfaces disinfected   promote handwashing   rest/sleep promoted   single patient room provided  Goal: Optimal Comfort and Wellbeing  Outcome: Ongoing (see interventions/notes)  Intervention: Provide Person-Centered Care  Recent Flowsheet Documentation  Taken 02/10/2023 2000 by Leanne Lovely, RN  Trust Relationship/Rapport:   care explained   choices provided  Goal: Rounds/Family Conference  Outcome: Ongoing (see interventions/notes)     Problem: Health Knowledge, Opportunity to Enhance (Adult,Obstetrics,Pediatric)  Goal: Knowledgeable about Health Subject/Topic  Description: Patient will demonstrate the desired outcomes by discharge/transition of care.  Outcome: Ongoing (see interventions/notes)  Intervention: Enhance Health Knowledge  Recent Flowsheet Documentation  Taken 02/10/2023 2000 by Leanne Lovely, RN  Family/Support System Care: self-care  encouraged     Problem: Skin Injury Risk Increased  Goal: Skin Health and Integrity  Outcome: Ongoing (see interventions/notes)  Intervention: Optimize Skin Protection  Recent Flowsheet Documentation  Taken 02/10/2023 2000 by Leanne Lovely, RN  Pressure Reduction Techniques: Frequent weight shifting encouraged  Pressure Reduction Devices: Repositioning wedges/pillows utilized  Skin Protection:   adhesive use limited   incontinence pads utilized     Problem: Oral Intake Inadequate  Goal: Improved Oral Intake  Outcome: Ongoing (see interventions/notes)     Problem: Malnutrition  Goal: Improved Nutritional Intake  Outcome: Ongoing (see interventions/notes)

## 2023-02-10 NOTE — Care Plan (Signed)
Problem: Adult Inpatient Plan of Care  Goal: Plan of Care Review  Outcome: Ongoing (see interventions/notes)  Flowsheets (Taken 02/10/2023 2009)  Progress: no change  Plan of Care Reviewed With: patient  Goal: Patient-Specific Goal (Individualized)  Outcome: Ongoing (see interventions/notes)  Flowsheets (Taken 02/10/2023 2009)  Patient-Specific Goals (Include Timeframe): TO GO HOME  Plan of Care Reviewed With: patient  Goal: Absence of Hospital-Acquired Illness or Injury  Outcome: Ongoing (see interventions/notes)  Intervention: Identify and Manage Fall Risk  Recent Flowsheet Documentation  Taken 02/10/2023 1800 by Melvia Heaps, RN  Safety Promotion/Fall Prevention:   activity supervised   fall prevention program maintained   nonskid shoes/slippers when out of bed   safety round/check completed  Taken 02/10/2023 1600 by Melvia Heaps, RN  Safety Promotion/Fall Prevention:   activity supervised   fall prevention program maintained   nonskid shoes/slippers when out of bed   safety round/check completed  Taken 02/10/2023 1452 by Melvia Heaps, RN  Safety Promotion/Fall Prevention:   activity supervised   fall prevention program maintained   nonskid shoes/slippers when out of bed   safety round/check completed  Taken 02/10/2023 1400 by Melvia Heaps, RN  Safety Promotion/Fall Prevention:   activity supervised   fall prevention program maintained   nonskid shoes/slippers when out of bed   safety round/check completed  Taken 02/10/2023 1200 by Melvia Heaps, RN  Safety Promotion/Fall Prevention:   activity supervised   fall prevention program maintained   nonskid shoes/slippers when out of bed   safety round/check completed  Taken 02/10/2023 1000 by Melvia Heaps, RN  Safety Promotion/Fall Prevention:   activity supervised   fall prevention program maintained   nonskid shoes/slippers when out of bed   safety round/check completed  Taken 02/10/2023 0800 by Melvia Heaps, RN  Safety Promotion/Fall Prevention:    activity supervised   fall prevention program maintained   nonskid shoes/slippers when out of bed   safety round/check completed  Intervention: Prevent Skin Injury  Recent Flowsheet Documentation  Taken 02/10/2023 1600 by Melvia Heaps, RN  Body Position: side lying, left  Taken 02/10/2023 1400 by Melvia Heaps, RN  Body Position: supine, head elevated  Taken 02/10/2023 1200 by Melvia Heaps, RN  Body Position: side lying, right  Taken 02/10/2023 1000 by Melvia Heaps, RN  Body Position: side lying, left  Taken 02/10/2023 0800 by Melvia Heaps, RN  Body Position: supine, head elevated  Intervention: Prevent and Manage VTE (Venous Thromboembolism) Risk  Recent Flowsheet Documentation  Taken 02/10/2023 1452 by Melvia Heaps, RN  VTE Prevention/Management: ambulation promoted  Taken 02/10/2023 0800 by Melvia Heaps, RN  VTE Prevention/Management: ambulation promoted  Intervention: Prevent Infection  Recent Flowsheet Documentation  Taken 02/10/2023 1800 by Melvia Heaps, RN  Infection Prevention:   barrier precautions utilized   promote handwashing   rest/sleep promoted  Taken 02/10/2023 1600 by Melvia Heaps, RN  Infection Prevention:   barrier precautions utilized   promote handwashing   rest/sleep promoted  Taken 02/10/2023 1400 by Melvia Heaps, RN  Infection Prevention:   barrier precautions utilized   promote handwashing   rest/sleep promoted  Taken 02/10/2023 1200 by Melvia Heaps, RN  Infection Prevention:   barrier precautions utilized   promote handwashing   rest/sleep promoted  Taken 02/10/2023 1000 by Melvia Heaps, RN  Infection Prevention:   barrier precautions utilized   promote handwashing   rest/sleep promoted  Taken 02/10/2023 0800 by Melvia Heaps, RN  Infection Prevention:   barrier precautions utilized   promote handwashing  rest/sleep promoted  Goal: Optimal Comfort and Wellbeing  Outcome: Ongoing (see interventions/notes)  Intervention: Provide Person-Centered Care  Recent Flowsheet  Documentation  Taken 02/10/2023 1452 by Melvia Heaps, RN  Trust Relationship/Rapport: care explained  Taken 02/10/2023 0800 by Melvia Heaps, RN  Trust Relationship/Rapport: care explained  Goal: Rounds/Family Conference  Outcome: Ongoing (see interventions/notes)   LYING IN BED.  DENIES COMPLAINTS OR WANTS.  NO S/S OF DISTRESS NOTED.  CALL LIGHT IN REACH.  SIDERAILS UP X 2.  PT ALERT AND ORIENTED X 4.  BED ALARM ON.  PT HAS SLEPT OFF AND ON THROUGHOUT THE DAY.  SEEMED  A LITTLE WEAK ON AMBULATION. THIS A.M.  CONTINUE IV FLUIDS .

## 2023-02-10 NOTE — Progress Notes (Signed)
Progress Note     Theresa Greene 88 y.o. female  Date of Birth: 1934-09-14  Date of Admit:  02/07/2023   Date of Service: 02/10/2023    Attending: Kathyrn Drown, MD Code Status:No CPR   PCP: [facility], DO      Summary:  Theresa Greene is an 88 year old female with a past medical history of paroxysmal atrial fibrillation, hypertension, hyperlipidemia, rheumatoid arthritis, CKD stage IIIB who presented as a transfer from [facility]. The patient initially presented to [facility] for generalized weakness. She stated that over the last two weeks she has had progressive weakness to the point that she required help to go to the restroom. She also admitted to falling in her bathroom on to the tile floor. She denied any injuries from her fall. At baseline she is typically able to ambulate with her walker. She also admitted to decreased oral intake over this same time period. The patient noted that she was diagnosed with COVID-19 in 2020 and has lost about 50lbs since this time due to decreased appetite and attributes that as a cause of her generalized weakness. She denied smoking history and stated that she is up to date on her cancer screenings. She denies any chest pain, shortness of breath, nausea, vomiting, or diarrhea. She was hypertensive on arrival to the ED at 134/76mmHg but was otherwise hemodynamically stable. Labs at the outside facility were significant for creatinine 2.36 (baseline approximately 1.1-1.2) and mildly elevated troponins at 31.2 --> 32.3. CXR showed no acute cardiopulmonary process. At the outside facility she received aspirin 324mg , hydralazine IV 10mg , 1.5L NS bolus, and was started on maintenance fluids of NS 100cc/hr. The patient was subsequently transferred to [facility] as [facility] did not have any beds available. She was admitted to the academic medicine team for further evaluation and management. Patient's renal function is improving with fluid resuscitation. She was noted to  have hypercalcemia on her labs. Monoclonal gammopathy work-up was sent and Calcitriol was held.     Interval History:    No acute events overnight. Patient remains very weak. Kidney function is improving to baseline. Serum calcium remains high. Monoclonal gammopathy work-up is pending.       Review of Systems:   Negative unless otherwise stated above.       Objective    Medical History     PMHx:    Past Medical History:   Diagnosis Date    Arthritis     Asthma     Back problem     CAD S/P percutaneous coronary angioplasty 2010    Pt reports 5 years ago    Charcot's joint of foot, left 08/06/2014    Colon polyp     CVA (cerebrovascular accident) (CMS HCC) 1995    no residual deficits    Deaf     Diverticulitis of colon     Gastroesophageal reflux disease 08/06/2014    GI bleed     H/O hearing loss     History of cardiac pacemaker 08/06/2014    HTN (hypertension)     Hyperlipidemia 12/25/2019    Muscle weakness     Neuropathy (CMS HCC)     Pacemaker     Paroxysmal atrial fibrillation (CMS HCC) 08/06/2014    PUD (peptic ulcer disease) 08/06/2014    C/b GIB Spring 2015    Rheumatoid arthritis (CMS HCC) 08/06/2014    Stage 3 chronic kidney disease (CMS HCC) 12/25/2019    Last Assessment & Plan:  Renal function stable, at baseline Stable electrolytes; restart sodium bicarb 650 mg BID  Continue renal protective ARB Discussed CKD risk factor modifications, including tight BP/glucose control, low protein diet, and avoidance of NSAIDs    Stented coronary artery     Vitamin D deficiency 12/25/2019    Wears glasses       Allergies:    Allergies   Allergen Reactions    Iodinated Contrast Media Anaphylaxis    Iv Contrast Anaphylaxis     makese her serverly sick    Etodolac NO Steroids unless approved by Attending Physician    Symbicort [Budesonide-Formoterol]     Codeine  Other Adverse Reaction (Add comment) and Nausea/ Vomiting     Stomach pain  Stomach pain       Social History  Social History     Tobacco Use    Smoking status:  Never    Smokeless tobacco: Never   Vaping Use    Vaping status: Never Used   Substance Use Topics    Alcohol use: Never    Drug use: Never     Family History  Family Medical History:       Problem Relation (Age of Onset)    Brain cancer Mother    Ovarian Cancer Mother    Pancreatic Cancer Father           Home Meds:   Dexlansoprazole, acetaminophen, amLODIPine, amiodarone, aspirin, atorvastatin, calcitrioL, cephalexin, ergocalciferol (vitamin D2), folic acid, gabapentin, lipase-protease-amylase, losartan, lysine, melatonin, methotrexate, metoprolol succinate, mirabegron, nitroGLYCERIN, sertraline, and sodium bicarbonate         Objective Findings     Physical Exam:  BP (!) 94/38   Pulse 70   Temp 37.1 C (98.8 F)   Resp 14   Ht 1.6 m (5\' 3" )   Wt 53.1 kg (117 lb)   SpO2 97%   BMI 20.73 kg/m     General: Adult female who appears stated age, no apparent distress, cachetic, chronically ill appearing   HEENT:  Normocephalic/atraumatic  Cardio: RRR, no murmurs heard, no peripheral edema  Resp: CTA bilaterally with normal respiratory effort  Abd: Soft, NT, ND, Positive BS  Skin: Warm and dry. No lesions or rashes noted.   Neuro: Alert and oriented x3, CN II-XII grossly intact. Decreased muscle strength. No sensory deficits.     Summary of Lab Work and Diagnostic Studies:     CBC   Recent Labs     02/08/23  0515 02/09/23  0635 02/10/23  0530   WBC 5.4 5.7 6.9   HGB 11.0* 10.8* 11.5   HCT 33.7* 34.1* 35.1   PLTCNT 218 203 205      Chemistries   Recent Labs     02/08/23  0515 02/09/23  0635 02/10/23  0530   SODIUM 139 136 137   POTASSIUM 3.4* 3.2* 3.7   CHLORIDE 106 104 105   CO2 23 24 24    BUN 43* 32* 28*   CREATININE 1.90* 1.80* 1.57*   CALCIUM 11.0* 11.2* 11.2*   ALBUMIN  --  2.3*  --    MAGNESIUM 2.0 1.9 1.7*   PHOSPHORUS 3.6 2.8  --       Liver Enzymes   Recent Labs     02/09/23  0635   ALBUMIN 2.3*      Inflammatory Markers   No results for input(s): "CRP", "ESR", "LACTICACID" in the last 72 hours.    PROCALCITONIN   Cardiac and Coags   No results  for input(s): "UHCEASTTROPI", "BNP", "INR" in the last 72 hours.    Invalid input(s): "PTT", "PT"   Lipid Panel   No results for input(s): "HDL" in the last 72 hours.    Invalid input(s): "LDL", "CHOL"                   Assessment & Plan     Active Hospital Problems    Diagnosis    Primary Problem: Elevated troponin    Generalized weakness    Acute kidney injury (CMS HCC)    Stage 3b chronic kidney disease (CMS HCC)    Hyperlipidemia    HTN (hypertension)    Rheumatoid arthritis (CMS HCC)    PUD (peptic ulcer disease)    Paroxysmal atrial fibrillation (CMS HCC)       Generalized Weakness / Fall  -Patient presented to outside facility due to progressive generalized weakness and fall  -Likely secondary to chronic deconditioning, malnutrition, and poor PO intake   -PT/OT/Speech therapy ordered  -Fall precautions in place     Hypercalcemia  -Potential etiologies include mediation vs malignancy   -Corrected calcium 12.4   -PTH low at 7.1  -Monoclonal gammopathy work-up in process  -Continue fluid resuscitation  -Home medication- Calcitriol 0.93mcg three times weekly- HELD      Acute Kidney Injury on Chronic Kidney Disease Stage IIIB- Improving   -Likely secondary to poor PO intake  -Creatinine 2.36 on presentation to outside facility (baseline approximately 1.1-1.2)  -US kidney showed no evidence of obstruction   -Continue gentle maintenance fluids   -Avoid nephrotoxic agents     Acute Hypoxic Respiratory Failure- Resolved   -Unknown etiology  -Patient briefly requiring 2L NC (does not require supplemental oxygen at baseline), subsequently weaned off  -CXR showed no acute cardiopulmonary process and lungs are clear to auscultation      Elevated Troponin  -Likely secondary to acute kidney injury   -Patient reports no chest pain   -Troponins peaked at 42.4   -EKG showed an atrial paced rhythm with a non-specific T wave abnormality   -Continuous telemetry      Paroxysmal  Atrial Fibrillation   -Currently rate controlled  -Continue Amiodarone 200mg  daily and Toprol XL 100mg  daily   -Continuous telemetry      Protein Calorie Malnutrition   Patient meets criteria based on the following clinical characteristics:   Malnutrition (Undernutrition) Diagnosis  Malnutrition Chronicity: Chronic illness  Malnutrition Severity: Severe protein calorie malnutrition  Related to: Chronic illness  Energy Intake: Less than 75% of EER in greater than or equal to 1 month  Body Fat Loss: Severe  Muscle Mass Loss: Severe  Nutrition consulted  Nutrition Treatment Plan: Initiate oral supplement, Initiate vitamin/mineral supplement(s), Monitor weight trends  MNT protocol ordered  Monitor bowel functions  Started Megace 800mg  daily   Lab Results   Component Value Date    ALBUMIN 2.3 (L) 02/09/2023     Hypertension   -Remains mildly hypertensive   -Continue Toprol XL 100mg  daily and Amlodipine increased to 10mg  daily   -Home medication- Losartan 50mg  BID - HELD for AKI     Hyperlipidemia   -Continue Lipitor 20mg  daily     Rheumatoid Arthritis   -Continue Methotrexate 10mg  weekly      Peptic Ulcer Disease   -Continue Protonix 40mg  daily       Nutrition: DIETARY ORAL SUPPLEMENTS Product Name: [employer] Breakfast Essentials - Powder - Vanilla; Frequency: BREAKFAST/DINNER; Number of Containers: 1 Each  DIET CARDIAC Consistency/thickening:  MECHANICAL SOFT (Ground meat/Soft foods); Instructional: POSITION AS UPRIGHT AS POSSIBLE (BETWEEN 45-90 DEGREES) UNLESS CONTRAINDICATED   DVT PPx: Heparin 5,000U SQ TID  Code Status: No CPR      Isaias Sakai, DO, PGY-II  Case discussed with attending physician.     I saw and examined the patient on the date of service as noted above. The findings, assessment and plan were discussed with the resident on rounds. I am in agreement with the plan as detailed above and changes made on my part are reflected in the note.   Kathyrn Drown D.O.     This note may have been partially  generated using MModal Fluency Direct system, and there may be some incorrect words, spellings, and punctuation that were not noted in checking the note before saving.     End of Note

## 2023-02-11 ENCOUNTER — Other Ambulatory Visit: Payer: Self-pay

## 2023-02-11 ENCOUNTER — Inpatient Hospital Stay (HOSPITAL_COMMUNITY): Payer: Medicare Other | Admitting: Radiology

## 2023-02-11 ENCOUNTER — Encounter (HOSPITAL_COMMUNITY): Payer: Self-pay | Admitting: HOSPITALIST

## 2023-02-11 LAB — BLOOD GAS
%FIO2 (VENOUS): 21 %
BASE DEFICIT: 1.1 mmol/L (ref 0.0–3.0)
BICARBONATE (VENOUS): 24 mmol/L (ref 22.0–29.0)
O2 SATURATION (VENOUS): 94.3 %
PCO2 (VENOUS): 38 mm/Hg — ABNORMAL LOW (ref 41–51)
PH (VENOUS): 7.4 (ref 7.32–7.43)
PO2 (VENOUS): 72 mm/Hg

## 2023-02-11 LAB — URINALYSIS, MACROSCOPIC
BILIRUBIN: NOT DETECTED mg/dL
BLOOD: NOT DETECTED mg/dL
GLUCOSE: NOT DETECTED mg/dL
KETONES: NOT DETECTED mg/dL
LEUKOCYTES: NOT DETECTED WBCs/uL
NITRITE: NOT DETECTED
PH: 6 (ref 5.0–?)
PROTEIN: NOT DETECTED mg/dL
SPECIFIC GRAVITY: 1.007 (ref 1.005–?)
UROBILINOGEN: NOT DETECTED mg/dL

## 2023-02-11 LAB — PHOSPHORUS: PHOSPHORUS: 2.6 mg/dL (ref 2.3–4.0)

## 2023-02-11 LAB — CBC WITH DIFF
BASOPHIL #: <0.1 10*3/uL (ref <=0.20)
BASOPHIL %: 0.2 %
EOSINOPHIL #: 0.23 10*3/uL (ref <=0.50)
EOSINOPHIL %: 3.9 %
HCT: 33.1 % — ABNORMAL LOW (ref 34.8–46.0)
HGB: 10.6 g/dL — ABNORMAL LOW (ref 11.5–16.0)
IMMATURE GRANULOCYTE #: <0.1 10*3/uL (ref <0.10)
IMMATURE GRANULOCYTE %: 0.7 % (ref 0.0–1.0)
LYMPHOCYTE #: 0.77 10*3/uL — ABNORMAL LOW (ref 1.00–4.80)
LYMPHOCYTE %: 13.2 %
MCH: 30.2 pg (ref 26.0–32.0)
MCHC: 32 g/dL (ref 31.0–35.5)
MCV: 94.3 fL (ref 78.0–100.0)
MONOCYTE #: 0.16 10*3/uL — ABNORMAL LOW (ref 0.20–1.10)
MONOCYTE %: 2.7 %
MPV: 11.7 fL (ref 8.7–12.5)
NEUTROPHIL #: 4.64 10*3/uL (ref 1.50–7.70)
NEUTROPHIL %: 79.3 %
PLATELETS: 220 10*3/uL (ref 150–400)
RBC: 3.51 10*6/uL — ABNORMAL LOW (ref 3.85–5.22)
RDW-CV: 17.1 % — ABNORMAL HIGH (ref 11.5–15.5)
WBC: 5.9 10*3/uL (ref 3.7–11.0)

## 2023-02-11 LAB — COMPREHENSIVE METABOLIC PANEL, NON-FASTING
ALBUMIN: 2.1 g/dL — ABNORMAL LOW (ref 3.4–4.8)
ALKALINE PHOSPHATASE: 73 U/L (ref 55–145)
ALT (SGPT): 17 U/L (ref 8–22)
ANION GAP: 10 mmol/L (ref 4–13)
AST (SGOT): 43 U/L (ref 8–45)
BILIRUBIN TOTAL: 0.4 mg/dL (ref 0.3–1.3)
BUN/CREA RATIO: 19 (ref 6–22)
BUN: 31 mg/dL — ABNORMAL HIGH (ref 8–25)
CALCIUM: 10.5 mg/dL — ABNORMAL HIGH (ref 8.6–10.3)
CHLORIDE: 108 mmol/L (ref 96–111)
CO2 TOTAL: 21 mmol/L — ABNORMAL LOW (ref 23–31)
CREATININE: 1.63 mg/dL — ABNORMAL HIGH (ref 0.60–1.05)
ESTIMATED GFR - FEMALE: 31 mL/min/BSA — ABNORMAL LOW (ref >=60)
GLUCOSE: 102 mg/dL (ref 65–125)
POTASSIUM: 3.7 mmol/L (ref 3.5–5.1)
PROTEIN TOTAL: 4.9 g/dL — ABNORMAL LOW (ref 6.0–8.0)
SODIUM: 139 mmol/L (ref 136–145)

## 2023-02-11 LAB — EXTENDED RESPIRATORY VIRUS PANEL

## 2023-02-11 LAB — MRSA SCREEN, PCR, RAPID: MRSA COLONIZATION SCREEN: NOT DETECTED

## 2023-02-11 LAB — PROTEIN FOR ELECTROPHORESIS: PROTEIN TOTAL: 5.3 g/dL — ABNORMAL LOW (ref 5.6–7.6)

## 2023-02-11 LAB — MAGNESIUM: MAGNESIUM: 2.1 mg/dL (ref 1.8–2.6)

## 2023-02-11 LAB — STREPTOCOCCUS PNEUMONIAE ANTIGEN: S. PNEUMONIAE ANTIGEN: NOT DETECTED

## 2023-02-11 LAB — PROCALCITONIN: PROCALCITONIN: 0.2 ng/mL (ref <0.50)

## 2023-02-11 LAB — ALBUMIN FOR ELECTROPHORESIS: ALBUMIN: 2.8 g/dL — ABNORMAL LOW (ref 3.4–4.8)

## 2023-02-11 LAB — LEGIONELLA URINE ANTIGEN: LEGIONELLA ANTIGEN: NEGATIVE

## 2023-02-11 MED ORDER — SODIUM CHLORIDE 0.9 % INTRAVENOUS PIGGYBACK
100.0000 mg | Freq: Two times a day (BID) | INTRAVENOUS | Status: AC
Start: 2023-02-11 — End: 2023-02-15
  Administered 2023-02-11: 100 mg via INTRAVENOUS
  Administered 2023-02-11: 0 mg via INTRAVENOUS
  Administered 2023-02-12: 100 mg via INTRAVENOUS
  Administered 2023-02-12: 0 mg via INTRAVENOUS
  Administered 2023-02-12: 100 mg via INTRAVENOUS
  Administered 2023-02-12: 0 mg via INTRAVENOUS
  Administered 2023-02-13: 100 mg via INTRAVENOUS
  Administered 2023-02-13: 0 mg via INTRAVENOUS
  Administered 2023-02-13: 100 mg via INTRAVENOUS
  Administered 2023-02-13 – 2023-02-14 (×2): 0 mg via INTRAVENOUS
  Administered 2023-02-14: 100 mg via INTRAVENOUS
  Administered 2023-02-14: 0 mg via INTRAVENOUS
  Administered 2023-02-14 – 2023-02-15 (×2): 100 mg via INTRAVENOUS
  Administered 2023-02-15: 0 mg via INTRAVENOUS
  Administered 2023-02-15: 100 mg via INTRAVENOUS
  Administered 2023-02-15: 0 mg via INTRAVENOUS
  Filled 2023-02-11 (×9): qty 10

## 2023-02-11 MED ORDER — FUROSEMIDE 10 MG/ML INJECTION SOLUTION
40.0000 mg | Freq: Once | INTRAMUSCULAR | Status: AC
Start: 2023-02-11 — End: 2023-02-11
  Administered 2023-02-11: 40 mg via INTRAVENOUS
  Filled 2023-02-11: qty 4

## 2023-02-11 MED ORDER — SODIUM CHLORIDE 0.9 % INTRAVENOUS PIGGYBACK
2.0000 g | INTRAVENOUS | Status: AC
Start: 2023-02-11 — End: 2023-02-15
  Administered 2023-02-11: 2 g via INTRAVENOUS
  Administered 2023-02-11: 0 g via INTRAVENOUS
  Administered 2023-02-12: 2 g via INTRAVENOUS
  Administered 2023-02-12: 0 g via INTRAVENOUS
  Administered 2023-02-13: 2 g via INTRAVENOUS
  Administered 2023-02-13: 0 g via INTRAVENOUS
  Administered 2023-02-14: 2 g via INTRAVENOUS
  Administered 2023-02-14 – 2023-02-15 (×2): 0 g via INTRAVENOUS
  Administered 2023-02-15: 2 g via INTRAVENOUS
  Filled 2023-02-11 (×5): qty 20

## 2023-02-11 MED FILL — mirabegron ER 50 mg tablet,extended release 24 hr: ORAL | 90 days supply | Qty: 90 | Fill #0 | Status: AC

## 2023-02-11 MED FILL — cephalexin 250 mg capsule: ORAL | 90 days supply | Qty: 90 | Fill #0 | Status: AC

## 2023-02-11 NOTE — Care Management Notes (Signed)
Social Work was consulted to assist with SNF placement of patient at discharge. The patient is requesting to go to [facility] and a referral was sent to that facility via [employer]. After review, they are able to accept this patient. The [place] PAS was completed and is approved and attached to the referral in [place] for [facility] to retrieve. Treatment team notified of bed available today; however, patient is not medically ready today. Will continue to follow.   Joseph Art, BSW

## 2023-02-11 NOTE — Care Plan (Signed)
Problem: Adult Inpatient Plan of Care  Goal: Plan of Care Review  Outcome: Ongoing (see interventions/notes)     Problem: Oral Intake Inadequate  Goal: Improved Oral Intake  Outcome: Ongoing (see interventions/notes)     Problem: Malnutrition  Goal: Improved Nutritional Intake  Outcome: Ongoing (see interventions/notes)   [facility]  [facility]  Medical Nutrition Therapy Follow Up    ASSESSMENT: No current weight available.  Pt w/ hx of esophageal narrowing s/p dilation.  Tolerates mech soft foods best.  Started Megace (6/29) to help stimulate appetite.  Currently consuming 25-50% of meals.  Does not like the [employer], willing to try [employer].  Plan is LTC facility d/t deconditioning.    Current Diet Order/Nutrition Support:  DIET CARDIAC Consistency/thickening: MECHANICAL SOFT (Ground meat/Soft foods); Instructional: POSITION AS UPRIGHT AS POSSIBLE (BETWEEN 45-90 DEGREES) UNLESS CONTRAINDICATED  DIETARY ORAL SUPPLEMENTS Product Name: Magic Cup - Vanilla; Frequency: LUNCH/DINNER; Number of Containers: 1 Each     Height Used for Calculations: 160 cm (5' 2.99")  Weight Used For Calculations: 53 kg (116 lb 13.5 oz)  BMI (kg/m2): 20.75  BMI Assessment: BMI 18.5-24.9: normal  Ideal Body Weight (IBW) (kg): 52.7  % Ideal Body Weight: 100.57             Re-assessed needs if applicable:    Energy Calorie Requirements: 1590-1855 per day (30-35kcals/53kg)  Protein Requirements (gms/day): 53-64 gms per day (1-1.2g/53kg)  Fluid Requirements: 1325-1590 ml per day (25-59mLs/53kg)    NUTRITION DIAGNOSIS: Unspecified severe protein calorie malnutrition related to Current medical condition, Pathophysiological  Prolonged catabolic illness, Increased biological demands for nutrients, and Increased energy needs as evidenced by Unintentional weight loss , Decreased oral intake , GI symptoms , Medical condition associated with diagnosis/ treatment.    INTERVENTION:   Cardiac, mech soft foods  D/C CIB  Add  Magic Cup BID     GOAL:   Consume > 50% of meals and supplements    MONITORING/EVALUATION:   Not meeting nutrition goal, continue  RD will continue to follow      Jerel Shepherd, RD  02/11/2023, 12:34

## 2023-02-11 NOTE — Care Plan (Signed)
[facility]  Rehabilitation Services  Occupational Therapy Progress Note    Patient Name: Theresa Greene  Date of Birth: Dec 28, 1934  Height:  160 cm (5\' 3" )  Weight:  53.1 kg (117 lb)  Room/Bed: 328/A  Payor: MEDICARE / Plan: MEDICARE PART A AND B / Product Type: Medicare /     Assessment:    Pt tolerated treatment fairly, continuing to be limited by weakness, impaired balance, and impaired activity tolerance. Pt may benefit from continued OT services to improve IND/safety w/ ADLs, functional mobility, and functional transfers.     Discharge Disposition: rehab, TBD.     02/11/23 1141   Therapist Pager   OT Assigned/ Pager # [phone]   Rehab Session   Document Type therapy progress note (daily note)   Total OT Minutes: 23   Daily Activity AM-PAC/6-clicks Score   Putting on/Taking off clothing on lower body 3   Bathing 3   Toileting 3   Putting on/Taking off clothing on upper body 4   Personal grooming 4   Eating Meals 4   Raw Score Total 21   Standardized (t-scale) Score 44.27   CMS 0-100% Score 32.79   CMS Modifier CJ             Plan:   Continue to follow patient according to established plan of care.  The risks/benefits of therapy have been discussed with the patient/caregiver and he/she is in agreement with the established plan of care.     Subjective & Objective:     RN cleared pt for OT session. Pt was supine in bed, no visitors present. Pt t/f supine to EOB w/ MIN A, sat at EOB for a few mins to acclimate to upright. Pt required cues for anterior weight shift. Pt had multiple posterior LOBs requiring MOD A for correction. Pt completed STS x 7 total attempts at EOB w/ MIN A w/ bed in elevated position. Pt required cues for forward weight shift d/t posterior lean. Pt rested at EOB, performed BIL UE forward press, elbow flexion/extension, and digital flexion/extension w/ cues for proper completion w/ emphasis on improving strength needed for ADLs. Pt returned to supine w/ MOD A. Pt was left  seated in bed, call button within reach and bed alarm set for safety. All lines intact and needs met at this time.     Therapist:   Celesta Gentile, MOTR/L     Start Time: 1118  End Time: 1141  Total Treatment Time: 23 minutes  Charges Entered: 1 OT TX, 1 OT TA  Department Number: [id number]

## 2023-02-11 NOTE — Care Plan (Signed)
Pt continues to require O2 at 5L/NC today and has had increased confusion. CXR, blood gases, virus panel, rapid MRSA and U/A obtained today. See results in chart for details. Rocephin and Doxycycline IVPB were initiated for CAP. IVP lasix given x1 dose for crackles to bilateral lung fields, IVF put on hold. Plan is to discharge to [facility] when medically cleared. No other significant changes in pt's condition or plan of care noted at this time.   Karlton Lemon, RN    Problem: Adult Inpatient Plan of Care  Goal: Plan of Care Review  Outcome: Ongoing (see interventions/notes)  Goal: Patient-Specific Goal (Individualized)  Outcome: Ongoing (see interventions/notes)  Goal: Absence of Hospital-Acquired Illness or Injury  Outcome: Ongoing (see interventions/notes)  Intervention: Identify and Manage Fall Risk  Recent Flowsheet Documentation  Taken 02/11/2023 0800 by Karlton Lemon, RN  Safety Promotion/Fall Prevention:   activity supervised   fall prevention program maintained   nonskid shoes/slippers when out of bed   safety round/check completed  Intervention: Prevent and Manage VTE (Venous Thromboembolism) Risk  Recent Flowsheet Documentation  Taken 02/11/2023 0800 by Karlton Lemon, RN  VTE Prevention/Management:   ambulation promoted   anticoagulant therapy maintained  Goal: Optimal Comfort and Wellbeing  Outcome: Ongoing (see interventions/notes)  Goal: Rounds/Family Conference  Outcome: Ongoing (see interventions/notes)     Problem: Health Knowledge, Opportunity to Enhance (Adult,Obstetrics,Pediatric)  Goal: Knowledgeable about Health Subject/Topic  Description: Patient will demonstrate the desired outcomes by discharge/transition of care.  Outcome: Ongoing (see interventions/notes)     Problem: Skin Injury Risk Increased  Goal: Skin Health and Integrity  Outcome: Ongoing (see interventions/notes)  Intervention: Optimize Skin Protection  Recent Flowsheet Documentation  Taken 02/11/2023 0800 by Karlton Lemon, RN  Pressure Reduction Techniques: Frequent weight shifting encouraged     Problem: Oral Intake Inadequate  Goal: Improved Oral Intake  Outcome: Ongoing (see interventions/notes)     Problem: Malnutrition  Goal: Improved Nutritional Intake  Outcome: Ongoing (see interventions/notes)

## 2023-02-11 NOTE — Progress Notes (Signed)
Progress Note     Theresa Greene 88 y.o. female  Date of Birth: 08-12-1935  Date of Admit:  02/07/2023   Date of Service: 02/11/2023    Attending: Leonard Downing, MD Code Status:No CPR   PCP: [facility], DO      Summary:  Theresa Greene is an 88 year old female with a past medical history of paroxysmal atrial fibrillation, hypertension, hyperlipidemia, rheumatoid arthritis, CKD stage IIIB who presented as a transfer from [facility]. The patient initially presented to [facility] for generalized weakness. She stated that over the last two weeks she has had progressive weakness to the point that she required help to go to the restroom. She also admitted to falling in her bathroom on to the tile floor. She denied any injuries from her fall. At baseline she is typically able to ambulate with her walker. She also admitted to decreased oral intake over this same time period. The patient noted that she was diagnosed with COVID-19 in 2020 and has lost about 50lbs since this time due to decreased appetite and attributes that as a cause of her generalized weakness. She denied smoking history and stated that she is up to date on her cancer screenings. She denies any chest pain, shortness of breath, nausea, vomiting, or diarrhea. She was hypertensive on arrival to the ED at 134/84mmHg but was otherwise hemodynamically stable. Labs at the outside facility were significant for creatinine 2.36 (baseline approximately 1.1-1.2) and mildly elevated troponins at 31.2 --> 32.3. CXR showed no acute cardiopulmonary process. At the outside facility she received aspirin 324mg , hydralazine IV 10mg , 1.5L NS bolus, and was started on maintenance fluids of NS 100cc/hr. The patient was subsequently transferred to [facility] as [facility] did not have any beds available. She was admitted to the academic medicine team for further evaluation and management. Patient's renal function is improving with fluid resuscitation. She was noted  to have hypercalcemia on her labs. Monoclonal gammopathy work-up was sent and Calcitriol was held.     Interval History:    Patient continues to be very weak. Kidney function is improving to baseline. Serum calcium remains high. Monoclonal gammopathy work-up is pending. Patient spiked a fever last night of 37.8, ordered a respiratory panel which was negative. Chest xray shows pneumonia and patient started on rocephin and doxycyline. Patient's daughter has concerns for confusion so a UA has been ordered due to history of recurrent infections and fever overnight.     Review of Systems:   Negative unless otherwise stated above.       Objective    Medical History     PMHx:    Past Medical History:   Diagnosis Date    Arthritis     Asthma     Back problem     CAD S/P percutaneous coronary angioplasty 2010    Pt reports 5 years ago    Charcot's joint of foot, left 08/06/2014    Colon polyp     CVA (cerebrovascular accident) (CMS HCC) 1995    no residual deficits    Deaf     Diverticulitis of colon     ESBL (extended spectrum beta-lactamase) producing bacteria infection 02/08/2022    ESBL + Klebsiella pneumoniae urine 03/22/22, 02/08/22    Gastroesophageal reflux disease 08/06/2014    GI bleed     H/O hearing loss     History of cardiac pacemaker 08/06/2014    HTN (hypertension)     Hyperlipidemia 12/25/2019    Muscle weakness  Neuropathy (CMS HCC)     Pacemaker     Paroxysmal atrial fibrillation (CMS HCC) 08/06/2014    PUD (peptic ulcer disease) 08/06/2014    C/b GIB Spring 2015    Rheumatoid arthritis (CMS HCC) 08/06/2014    Stage 3 chronic kidney disease (CMS HCC) 12/25/2019    Last Assessment & Plan:  Renal function stable, at baseline Stable electrolytes; restart sodium bicarb 650 mg BID  Continue renal protective ARB Discussed CKD risk factor modifications, including tight BP/glucose control, low protein diet, and avoidance of NSAIDs    Stented coronary artery     Vitamin D deficiency 12/25/2019    Wears glasses        Allergies:    Allergies   Allergen Reactions    Iodinated Contrast Media Anaphylaxis    Iv Contrast Anaphylaxis     makese her serverly sick    Etodolac NO Steroids unless approved by Attending Physician    Symbicort [Budesonide-Formoterol]     Codeine  Other Adverse Reaction (Add comment) and Nausea/ Vomiting     Stomach pain  Stomach pain       Social History  Social History     Tobacco Use    Smoking status: Never    Smokeless tobacco: Never   Vaping Use    Vaping status: Never Used   Substance Use Topics    Alcohol use: Never    Drug use: Never     Family History  Family Medical History:       Problem Relation (Age of Onset)    Brain cancer Mother    Ovarian Cancer Mother    Pancreatic Cancer Father           Home Meds:   Dexlansoprazole, acetaminophen, amLODIPine, amiodarone, aspirin, atorvastatin, calcitrioL, cephalexin, ergocalciferol (vitamin D2), folic acid, gabapentin, lipase-protease-amylase, losartan, lysine, melatonin, methotrexate, metoprolol succinate, mirabegron, nitroGLYCERIN, sertraline, and sodium bicarbonate         Objective Findings     Physical Exam:  BP (!) 155/68   Pulse 70   Temp 37.1 C (98.8 F)   Resp 15   Ht 1.6 m (5\' 3" )   Wt 53.1 kg (117 lb)   SpO2 94%   BMI 20.73 kg/m     General: Adult female who appears stated age, no apparent distress, cachetic, chronically ill appearing   HEENT:  Normocephalic/atraumatic  Cardio: RRR, no murmurs heard, no peripheral edema  Resp: Bilateral crackles   Abd: Soft, NT, ND, Positive BS  Skin: Warm and dry. No lesions or rashes noted.   Neuro: Alert and oriented x3, CN II-XII grossly intact. Decreased muscle strength. No sensory deficits.     Summary of Lab Work and Diagnostic Studies:     CBC   Recent Labs     02/09/23  0635 02/10/23  0530 02/11/23  0542   WBC 5.7 6.9 5.9   HGB 10.8* 11.5 10.6*   HCT 34.1* 35.1 33.1*   PLTCNT 203 205 220      Chemistries   Recent Labs     02/09/23  0635 02/10/23  0530 02/11/23  0542   SODIUM 136 137 139    POTASSIUM 3.2* 3.7 3.7   CHLORIDE 104 105 108   CO2 24 24 21*   BUN 32* 28* 31*   CREATININE 1.80* 1.57* 1.63*   CALCIUM 11.2* 11.2* 10.5*   ALBUMIN 2.3*  --  2.1*   MAGNESIUM 1.9 1.7* 2.1   PHOSPHORUS 2.8  --  2.6  Liver Enzymes   Recent Labs     02/09/23  0635 02/11/23  0542   TOTALPROTEIN  --  4.9*   ALBUMIN 2.3* 2.1*   AST  --  43   ALT  --  17   ALKPHOS  --  73      Inflammatory Markers   No results for input(s): "CRP", "ESR", "LACTICACID" in the last 72 hours.   PROCALCITONIN   Cardiac and Coags   No results for input(s): "UHCEASTTROPI", "BNP", "INR" in the last 72 hours.    Invalid input(s): "PTT", "PT"   Lipid Panel   No results for input(s): "HDL" in the last 72 hours.    Invalid input(s): "LDL", "CHOL"                   Assessment & Plan     Active Hospital Problems    Diagnosis    Primary Problem: Elevated troponin    Generalized weakness    Acute kidney injury (CMS HCC)    Stage 3b chronic kidney disease (CMS HCC)    Hyperlipidemia    HTN (hypertension)    Rheumatoid arthritis (CMS HCC)    PUD (peptic ulcer disease)    Paroxysmal atrial fibrillation (CMS HCC)       Generalized Weakness / Fall  -Patient presented to outside facility due to progressive generalized weakness and fall  -Likely secondary to chronic deconditioning, malnutrition, and poor PO intake   -PT/OT/Speech therapy ordered  -Fall precautions in place     Community Acquired Pneumonia (Suspected Bacterial, Unknown Organism)   -Patient spiked fever of 37.8 overnight on 02/10/23  -Procalcitonin ordered   -Respiratory viral panel was negative   -Streptococcus pneumonia antigen, legionella antigen, and MRSA screen pending   - Chest xray showed bilateral multifocal pneumonia  - Started on Rocephin (2 mg IV once daily) and Doxycyline (100 mg IV twice daily)    Hypercalcemia  -Potential etiologies include mediation vs malignancy   -Corrected calcium 12.0   -PTH low at 7.1  -Monoclonal gammopathy work-up in process  -Fluids discontinued due  to oxygen requirement   -Lasix 40mg  IV x1 dose   -Home medication- Calcitriol 0.86mcg three times weekly- HELD      Acute Kidney Injury on Chronic Kidney Disease Stage IIIB- Improving   -Likely secondary to poor PO intake  -Creatinine 2.36 on presentation to outside facility (baseline approximately 1.1-1.2)  -US kidney showed no evidence of obstruction   -IV fluids discontinued due to oxygen requirement   -Avoid nephrotoxic agents     Acute Hypoxic Respiratory Failure    -Likely secondary to pneumonia   -Patient requiring 5L NC (does not require supplemental oxygen at baseline)  -CXR suggestive of multifocal pneumonia   -Empirically started on antibiotics      Elevated Troponin  -Likely secondary to acute kidney injury   -Patient reports no chest pain   -Troponins peaked at 42.4   -EKG showed an atrial paced rhythm with a non-specific T wave abnormality   -Continuous telemetry      Paroxysmal Atrial Fibrillation   -Currently rate controlled  -Continue Amiodarone 200mg  daily and Toprol XL 100mg  daily   -Continuous telemetry      Protein Calorie Malnutrition   Patient meets criteria based on the following clinical characteristics:   Malnutrition (Undernutrition) Diagnosis  Malnutrition Chronicity: Chronic illness  Malnutrition Severity: Severe protein calorie malnutrition  Related to: Chronic illness  Energy Intake: Less than 75% of EER in greater than  or equal to 1 month  Body Fat Loss: Severe  Muscle Mass Loss: Severe  Nutrition consulted  Nutrition Treatment Plan: Initiate oral supplement, Initiate vitamin/mineral supplement(s), Monitor weight trends  MNT protocol ordered  Monitor bowel functions  Started Megace 800mg  daily   Lab Results   Component Value Date    ALBUMIN 2.1 (L) 02/11/2023     Hypertension   -Remains mildly hypertensive   -Continue Toprol XL 100mg  daily and Amlodipine increased to 10mg  daily   -Home medication- Losartan 50mg  BID - HELD for AKI     Hyperlipidemia   -Continue Lipitor 20mg  daily      Rheumatoid Arthritis   -Continue Methotrexate 10mg  weekly      Peptic Ulcer Disease   -Continue Protonix 40mg  daily       Nutrition: DIET CARDIAC Consistency/thickening: MECHANICAL SOFT (Ground meat/Soft foods); Instructional: POSITION AS UPRIGHT AS POSSIBLE (BETWEEN 45-90 DEGREES) UNLESS CONTRAINDICATED  DIETARY ORAL SUPPLEMENTS Product Name: Magic Cup - Vanilla; Frequency: LUNCH/DINNER; Number of Containers: 1 Each   DVT PPx: Heparin 5,000U SQ TID  Code Status: No CPR      Mila Homer Tah, MD, PGY-I  Case discussed with attending physician.       Isaias Sakai, DO, PGY-III  I personally saw and examined the patient. Patient's assessment/plan was discussed with the medical student and any exceptions/additions are reflected above.  I have independently seen and examined the patient on the date of service.  I have discussed the history, presentation, and plan of care in detail with the resident..  I have reviewed the document above for medical content and agree as documented.  Not all grammatical errors, misspellings, transcription misinterpretations, and punctuation errors have been corrected.     Exam:  Cardiovascular: Agree as documented  Respiratory: Agree as documented  Leonard Downing MD    This note may have been partially generated using MModal Fluency Direct system, and there may be some incorrect words, spellings, and punctuation that were not noted in checking the note before saving.     End of Note

## 2023-02-11 NOTE — Care Plan (Signed)
[facility]  Rehabilitation Services  Physical Therapy Progress Note      Patient Name: Theresa Greene  Date of Birth: 05-06-1935  Height:  160 cm (5\' 3" )  Weight:  53.1 kg (117 lb)  Room/Bed: 328/A  Payor: MEDICARE / Plan: MEDICARE PART A AND B / Product Type: Medicare /     Assessment:        02/11/23 1425   Rehab Session   Document Type therapy progress note (daily note)   PT Visit Date 02/11/23   Total PT Minutes: 25   Symptoms Noted During/After Treatment fatigue   Symptoms Noted Comment increased confusion   Basic Mobility Am-PAC/6Clicks Score (APPROVED Staff)   Turning in bed without bedrails 3   Lying on back to sitting on edge of flat bed 3   Moving to and from a bed to a chair 3   Standing up from chair 3   Walk in room 3   Climbing 3-5 steps with railing 2   6 Clicks Raw Score total 17   Standardized (t-scale) score 39.67     Increased confusion, decreased strength, transfers, and ambulation this date. On 5 lpm O2 throughout session. Decreased standing balance, sidestepping, and steps to chair requiring mod A for safety, post COG.      D/C Disposition:    SFN    Plan:   Continue to follow patient according to established plan of care.  The risks/benefits of therapy have been discussed with the patient/caregiver and he/she is in agreement with the established plan of care.     Subjective & Objective:     Pt. Cleared for treatment by Morrie Sheldon, RN. Pt. Agreeable to participate, asking when her spouse is going to come get her to go home. Asked several times about family knowing where she is. Able to temporarily re-orient. Followed directions well. Pt. Completed supine to sit with min A with Vcs for sequencing, hand placement, scooting towards EOB. CGA to acclimate to upright. Sit to stand with min A x 3. Side stepping with [employer] min/mod A with Vcs for upright posture, maintaining balance and sequencing with assist to move [employer] as well. Returned to sitting for rest break. Std pivot transfer with  min/mod A and Vcs to turn to chair, reach back to lower to sitting. Pt. Left with radiology tech, posey alarm set, call light in reach. RN aware of pt. Up in chair.     Therapist:   Salome Spotted, MPT  [phone]    Start Time: 1400  End Time: 1425  PM Treatment Time: 25 minutes    Total Treatment Time: 25 minutes  Charges Entered: TAx2  Department Number: [id number]

## 2023-02-12 ENCOUNTER — Other Ambulatory Visit: Payer: Self-pay

## 2023-02-12 LAB — COMPREHENSIVE METABOLIC PANEL, NON-FASTING
ALBUMIN: 2.2 g/dL — ABNORMAL LOW (ref 3.4–4.8)
ALKALINE PHOSPHATASE: 78 U/L (ref 55–145)
ALT (SGPT): 29 U/L — ABNORMAL HIGH (ref 8–22)
ANION GAP: 11 mmol/L (ref 4–13)
AST (SGOT): 63 U/L — ABNORMAL HIGH (ref 8–45)
BILIRUBIN TOTAL: 0.5 mg/dL (ref 0.3–1.3)
BUN/CREA RATIO: 23 — ABNORMAL HIGH (ref 6–22)
BUN: 38 mg/dL — ABNORMAL HIGH (ref 8–25)
CALCIUM: 11 mg/dL — ABNORMAL HIGH (ref 8.6–10.3)
CHLORIDE: 107 mmol/L (ref 96–111)
CO2 TOTAL: 19 mmol/L — ABNORMAL LOW (ref 23–31)
CREATININE: 1.62 mg/dL — ABNORMAL HIGH (ref 0.60–1.05)
ESTIMATED GFR - FEMALE: 31 mL/min/BSA — ABNORMAL LOW (ref >=60)
GLUCOSE: 111 mg/dL (ref 65–125)
POTASSIUM: 3.6 mmol/L (ref 3.5–5.1)
PROTEIN TOTAL: 5.3 g/dL — ABNORMAL LOW (ref 6.0–8.0)
SODIUM: 137 mmol/L (ref 136–145)

## 2023-02-12 LAB — MANUAL DIFF AND MORPHOLOGY-SYSMEX
BASOPHIL #: <0.1 10*3/uL (ref <=0.20)
BASOPHIL %: 0 %
EOSINOPHIL #: <0.1 10*3/uL (ref <=0.50)
EOSINOPHIL %: 0 %
LYMPHOCYTE #: 0.3 10*3/uL — ABNORMAL LOW (ref 1.00–4.80)
LYMPHOCYTE %: 3 %
MONOCYTE #: <0.1 10*3/uL — ABNORMAL LOW (ref 0.20–1.10)
MONOCYTE %: 0 %
NEUTROPHIL #: 9.7 10*3/uL — ABNORMAL HIGH (ref 1.50–7.70)
NEUTROPHIL %: 95 %
NEUTROPHIL BANDS %: 2 %

## 2023-02-12 LAB — CBC WITH DIFF
HCT: 36.2 % (ref 34.8–46.0)
HGB: 12.2 g/dL (ref 11.5–16.0)
MCH: 31.4 pg (ref 26.0–32.0)
MCHC: 33.7 g/dL (ref 31.0–35.5)
MCV: 93.1 fL (ref 78.0–100.0)
MPV: 11.5 fL (ref 8.7–12.5)
PLATELETS: 264 10*3/uL (ref 150–400)
RBC: 3.89 10*6/uL (ref 3.85–5.22)
RDW-CV: 17.1 % — ABNORMAL HIGH (ref 11.5–15.5)
WBC: 10 10*3/uL (ref 3.7–11.0)

## 2023-02-12 LAB — MONOCLONAL GAMMOPATHY PROFILE WITH IMMUNOTYPING REFLEX
ALBUMIN: 2.8 g/dL — ABNORMAL LOW (ref 3.4–4.8)
KAPPA FREE LIGHT CHAINS: 5.92 mg/dL — ABNORMAL HIGH (ref 1.25–3.25)
KAPPA/LAMBDA FLC RATIO: 1.18
LAMBDA FREE LIGHT CHAINS: 5.03 mg/dL — ABNORMAL HIGH (ref 0.60–2.70)
TOTAL PROTEIN: 5.3 g/dL

## 2023-02-12 LAB — KAPPA AND LAMBDA FREE LIGHT CHAINS, SERUM
KAPPA FREE LIGHT CHAINS: 5.92 mg/dL — ABNORMAL HIGH (ref 1.25–3.25)
KAPPA/LAMBDA FLC RATIO: 1.18 (ref 0.80–2.10)
LAMBDA FREE LIGHT CHAINS: 5.03 mg/dL — ABNORMAL HIGH (ref 0.60–2.70)

## 2023-02-12 LAB — PROCALCITONIN: PROCALCITONIN: 0.43 ng/mL (ref <0.50)

## 2023-02-12 LAB — PHOSPHORUS: PHOSPHORUS: 3.4 mg/dL (ref 2.3–4.0)

## 2023-02-12 LAB — MAGNESIUM: MAGNESIUM: 1.9 mg/dL (ref 1.8–2.6)

## 2023-02-12 MED ORDER — POLYETHYLENE GLYCOL 3350 17 GRAM ORAL POWDER PACKET
17.0000 g | Freq: Every day | ORAL | Status: DC
Start: 2023-02-12 — End: 2023-02-17
  Administered 2023-02-12: 17 g via ORAL
  Administered 2023-02-13 – 2023-02-14 (×2): 0 g via ORAL
  Administered 2023-02-15 – 2023-02-16 (×2): 17 g via ORAL
  Filled 2023-02-12 (×6): qty 1

## 2023-02-12 MED ORDER — CALCITONIN (SALMON) 200 UNIT/ML INJECTION SOLUTION
4.0000 [IU]/kg | Freq: Every day | INTRAMUSCULAR | Status: AC
Start: 2023-02-12 — End: 2023-02-13
  Administered 2023-02-12 – 2023-02-13 (×2): 200 [IU] via SUBCUTANEOUS
  Filled 2023-02-12 (×2): qty 2

## 2023-02-12 MED ORDER — FUROSEMIDE 10 MG/ML INJECTION SOLUTION
40.0000 mg | Freq: Once | INTRAMUSCULAR | Status: AC
Start: 2023-02-12 — End: 2023-02-12
  Administered 2023-02-12: 40 mg via INTRAVENOUS
  Filled 2023-02-12: qty 4

## 2023-02-12 NOTE — Care Plan (Signed)
Patient's confusion continues today. Increased weakness noted from therapy. O2 has been weaned to 4L/NC, pt tolerating well. New orders for Miacalcin SQ for hypercalcemia, as well as a 1x dose of IVP lasix 40 mg given per order. No other significant changes to pt's condition or plan of care noted at this time.  Karlton Lemon, RN      Problem: Adult Inpatient Plan of Care  Goal: Plan of Care Review  Outcome: Ongoing (see interventions/notes)  Goal: Patient-Specific Goal (Individualized)  Outcome: Ongoing (see interventions/notes)  Goal: Absence of Hospital-Acquired Illness or Injury  Outcome: Ongoing (see interventions/notes)  Intervention: Identify and Manage Fall Risk  Recent Flowsheet Documentation  Taken 02/12/2023 0800 by Karlton Lemon, RN  Safety Promotion/Fall Prevention:   activity supervised   fall prevention program maintained   nonskid shoes/slippers when out of bed   safety round/check completed  Intervention: Prevent and Manage VTE (Venous Thromboembolism) Risk  Recent Flowsheet Documentation  Taken 02/12/2023 0800 by Karlton Lemon, RN  VTE Prevention/Management:   ambulation promoted   anticoagulant therapy maintained  Goal: Optimal Comfort and Wellbeing  Outcome: Ongoing (see interventions/notes)  Goal: Rounds/Family Conference  Outcome: Ongoing (see interventions/notes)     Problem: Health Knowledge, Opportunity to Enhance (Adult,Obstetrics,Pediatric)  Goal: Knowledgeable about Health Subject/Topic  Description: Patient will demonstrate the desired outcomes by discharge/transition of care.  Outcome: Ongoing (see interventions/notes)     Problem: Skin Injury Risk Increased  Goal: Skin Health and Integrity  Outcome: Ongoing (see interventions/notes)  Intervention: Optimize Skin Protection  Recent Flowsheet Documentation  Taken 02/12/2023 0800 by Karlton Lemon, RN  Pressure Reduction Techniques: Frequent weight shifting encouraged     Problem: Oral Intake Inadequate  Goal: Improved Oral  Intake  Outcome: Ongoing (see interventions/notes)     Problem: Malnutrition  Goal: Improved Nutritional Intake  Outcome: Ongoing (see interventions/notes)

## 2023-02-12 NOTE — Care Plan (Signed)
[facility]  Rehabilitation Services  Physical Therapy Progress Note      Patient Name: Theresa Greene  Date of Birth: [birth year]  Height:  160 cm (5\' 3" )  Weight:  53.1 kg (117 lb)  Room/Bed: 328/A  Payor: MEDICARE / Plan: MEDICARE PART A AND B / Product Type: Medicare /     Assessment:        02/12/23 1024   Rehab Session   Document Type therapy progress note (daily note)   PT Visit Date 02/12/23   Total PT Minutes: 23   Patient Effort fair   Symptoms Noted During/After Treatment fatigue;nausea   Symptoms Noted Comment increased confusion, decreased mobility   Basic Mobility Am-PAC/6Clicks Score (APPROVED Staff)   Turning in bed without bedrails 2   Lying on back to sitting on edge of flat bed 2   Moving to and from a bed to a chair 2   Standing up from chair 2   Walk in room 2   Climbing 3-5 steps with railing 1   6 Clicks Raw Score total 11   Standardized (t-scale) score 30.25     Pt. With increased confusion, hallucinating at times today. Decline in functional transfers requiring increased assist. Mod A x2 to attempt side stepping with WW with retropulsion. C/o nausea with attempting gait training. RN aware.      D/C Disposition:    SNF    Plan:   Continue to follow patient according to established plan of care.  The risks/benefits of therapy have been discussed with the patient/caregiver and he/she is in agreement with the established plan of care.     Subjective & Objective:     Pt. Cleared for treatment by Morrie Sheldon, RN. Pt. Resting in bed, willing to participate. Talking but confused, difficult to understand at times. Co-treat with OT to maximize functional mobility, participation, safety. Pt. Completed supine to sit with mod A x2. Max Vcs to scoot towards EOB with increased time required with CGA. Sitting EOB with increased fatigue, close SBA/CGA for safety. Sit to stand with mod A x2, L. LE sliding fwd, retropulsion. Requires Vcs for upright posture and mod A x2 with WW to maintain  upright. Attemtped 2 steps with pt. C/o increased nausea and fatigue and returned to sitting with mod A x2. Following short rest break, mod A x2 to return to standing, side stepping towards [facility] with mod A x 2 and [employer]. Returned to supine with min/mod A x2. Pt. Left with posey alarm, bed alarm set, call light in reach.     Therapist:   Salome Spotted, MPT  [phone]    Start Time: 1000  End Time: 1023  AM Treatment Time: 23 minutes    Total Treatment Time: 23 minutes  Charges Entered: TAx2  Department Number: [id number]

## 2023-02-12 NOTE — Progress Notes (Signed)
Progress Note     Theresa Greene 88 y.o. female  Date of Birth: [birth year]  Date of Admit:  02/07/2023   Date of Service: 02/12/2023    Attending: Leonard Downing, MD Code Status:No CPR   PCP: [facility], DO      Summary:  Theresa Greene "Theresa Greene is an 88 year old female with a past medical history of paroxysmal atrial fibrillation, hypertension, hyperlipidemia, rheumatoid arthritis, CKD stage IIIB who presented as a transfer from [facility]. The patient initially presented to [facility] for generalized weakness. She stated that over the last two weeks she has had progressive weakness to the point that she required help to go to the restroom. She also admitted to falling in her bathroom on to the tile floor. She denied any injuries from her fall. At baseline she is typically able to ambulate with her walker. She also admitted to decreased oral intake over this same time period. The patient noted that she was diagnosed with COVID-19 in 2020 and has lost about 50lbs since this time due to decreased appetite and attributes that as a cause of her generalized weakness. She denied smoking history and stated that she is up to date on her cancer screenings. She denies any chest pain, shortness of breath, nausea, vomiting, or diarrhea. She was hypertensive on arrival to the ED at 134/67mmHg but was otherwise hemodynamically stable. Labs at the outside facility were significant for creatinine 2.36 (baseline approximately 1.1-1.2) and mildly elevated troponins at 31.2 --> 32.3. CXR showed no acute cardiopulmonary process. At the outside facility she received aspirin 324mg , hydralazine IV 10mg , 1.5L NS bolus, and was started on maintenance fluids of NS 100cc/hr. The patient was subsequently transferred to [facility] as [facility] did not have any beds available. She was admitted to the academic medicine team for further evaluation and management. Patient's renal function is improving with fluid resuscitation. She was noted  to have hypercalcemia on her labs. Monoclonal gammopathy work-up was sent and Calcitriol was held. Patient continues to have elevated corrected calcium levels so calcitonin was ordered. Patient appears to be confused periodically throughout the day, this could be secondary to high calcium levels or prolonged hospital stay.    Interval History:    Patient continues to be very weak and has developed intermitted confusion. Kidney function is improving to baseline. Serum calcium remains high so calcitonin ordered as a one time dose. Monoclonal gammopathy work-up is pending. Patient remains on antibiotics for pneumonia.  Primary team will reach out to family today for update.     Review of Systems:   Negative unless otherwise stated above.       Objective    Medical History     PMHx:    Past Medical History:   Diagnosis Date    Arthritis     Asthma     Back problem     CAD S/P percutaneous coronary angioplasty 2010    Pt reports 5 years ago    Charcot's joint of foot, left 08/06/2014    Colon polyp     CVA (cerebrovascular accident) (CMS HCC) 1995    no residual deficits    Deaf     Diverticulitis of colon     ESBL (extended spectrum beta-lactamase) producing bacteria infection 02/08/2022    ESBL + Klebsiella pneumoniae urine 03/22/22, 02/08/22    Gastroesophageal reflux disease 08/06/2014    GI bleed     H/O hearing loss     History of cardiac pacemaker 08/06/2014  HTN (hypertension)     Hyperlipidemia 12/25/2019    Muscle weakness     Neuropathy (CMS HCC)     Pacemaker     Paroxysmal atrial fibrillation (CMS HCC) 08/06/2014    PUD (peptic ulcer disease) 08/06/2014    C/b GIB Spring 2015    Rheumatoid arthritis (CMS HCC) 08/06/2014    Stage 3 chronic kidney disease (CMS HCC) 12/25/2019    Last Assessment & Plan:  Renal function stable, at baseline Stable electrolytes; restart sodium bicarb 650 mg BID  Continue renal protective ARB Discussed CKD risk factor modifications, including tight BP/glucose control, low protein  diet, and avoidance of NSAIDs    Stented coronary artery     Vitamin D deficiency 12/25/2019    Wears glasses       Allergies:    Allergies   Allergen Reactions    Iodinated Contrast Media Anaphylaxis    Iv Contrast Anaphylaxis     makese her serverly sick    Etodolac NO Steroids unless approved by Attending Physician    Symbicort [Budesonide-Formoterol]     Codeine  Other Adverse Reaction (Add comment) and Nausea/ Vomiting     Stomach pain  Stomach pain       Social History  Social History     Tobacco Use    Smoking status: Never    Smokeless tobacco: Never   Vaping Use    Vaping status: Never Used   Substance Use Topics    Alcohol use: Never    Drug use: Never     Family History  Family Medical History:       Problem Relation (Age of Onset)    Brain cancer Mother    Ovarian Cancer Mother    Pancreatic Cancer Father           Home Meds:   Dexlansoprazole, acetaminophen, amLODIPine, amiodarone, aspirin, atorvastatin, calcitrioL, cephalexin, ergocalciferol (vitamin D2), folic acid, gabapentin, lipase-protease-amylase, losartan, lysine, melatonin, methotrexate, metoprolol succinate, mirabegron, nitroGLYCERIN, sertraline, and sodium bicarbonate         Objective Findings     Physical Exam:  BP 135/88   Pulse 69   Temp 37 C (98.6 F)   Resp 17   Ht 1.6 m (5\' 3" )   Wt 53.1 kg (117 lb)   SpO2 93%   BMI 20.73 kg/m     General: Adult female who appears stated age, no apparent distress, cachetic, chronically ill appearing   HEENT:  Normocephalic/atraumatic  Cardio: RRR, no murmurs heard, no peripheral edema  Resp: Bilateral crackles   Abd: Soft, NT, ND, Positive BS  Skin: Warm and dry. No lesions or rashes noted.   Neuro: Alert and oriented x3, CN II-XII grossly intact. Decreased muscle strength. No sensory deficits.     Summary of Lab Work and Diagnostic Studies:     CBC   Recent Labs     02/10/23  0530 02/11/23  0542 02/12/23  0629   WBC 6.9 5.9 10.0   HGB 11.5 10.6* 12.2   HCT 35.1 33.1* 36.2   PLTCNT 205 220 264    BANDS  --   --  2      Chemistries   Recent Labs     02/10/23  0530 02/11/23  0542 02/12/23  0629   SODIUM 137 139 137   POTASSIUM 3.7 3.7 3.6   CHLORIDE 105 108 107   CO2 24 21* 19*   BUN 28* 31* 38*   CREATININE 1.57* 1.63* 1.62*  CALCIUM 11.2* 10.5* 11.0*   ALBUMIN  --  2.1* 2.2*   MAGNESIUM 1.7* 2.1 1.9   PHOSPHORUS  --  2.6 3.4      Liver Enzymes   Recent Labs     02/11/23  0542 02/12/23  0629   TOTALPROTEIN 4.9* 5.3*   ALBUMIN 2.1* 2.2*   AST 43 63*   ALT 17 29*   ALKPHOS 73 78      Inflammatory Markers   No results for input(s): "CRP", "ESR", "LACTICACID" in the last 72 hours.   PROCALCITONIN   Cardiac and Coags   No results for input(s): "UHCEASTTROPI", "BNP", "INR" in the last 72 hours.    Invalid input(s): "PTT", "PT"   Lipid Panel   No results for input(s): "HDL" in the last 72 hours.    Invalid input(s): "LDL", "CHOL"                   Assessment & Plan     Active Hospital Problems    Diagnosis    Primary Problem: Elevated troponin    Generalized weakness    Acute kidney injury (CMS HCC)    Stage 3b chronic kidney disease (CMS HCC)    Hyperlipidemia    HTN (hypertension)    Rheumatoid arthritis (CMS HCC)    PUD (peptic ulcer disease)    Paroxysmal atrial fibrillation (CMS HCC)       Generalized Weakness / Fall  -Patient presented to outside facility due to progressive generalized weakness and fall  -Likely secondary to chronic deconditioning, malnutrition, and poor PO intake   -PT/OT/Speech therapy ordered  -Fall precautions in place     Community Acquired Pneumonia (Suspected Bacterial, Unknown Organism)   -Patient spiked fever of 37.8 overnight on 02/10/23  -Procalcitonin ordered   -Respiratory viral panel was negative   -Streptococcus pneumonia antigen, legionella antigen, and MRSA screen negative  - UA screen negative  - Chest xray showed bilateral multifocal pneumonia  - Continue Rocephin (2 mg IV once daily) and Doxycyline (100 mg IV twice daily)    Acute Metabolic Encephalopathy  - Likely  secondary to elevated corrected calcium levels, or prolonged hospital stay or infection  - UA was not suggestive of infection  - Chest xray revealed pneumonia, patient started on antibiotics.   -  Given Miacalcin 200 units SC x1 dose.  - Strict delirium precautions in place.    Hypercalcemia  -Potential etiologies include mediation vs malignancy   -Corrected calcium 12.2   -PTH low at 7.1  - Given Miacalcin 200 units SC x1 dose.  -Monoclonal gammopathy work-up in process  -Fluids discontinued due to oxygen requirement   -Lasix 40mg  IV x1 dose   -Home medication- Calcitriol 0.62mcg three times weekly- HELD      Acute Kidney Injury on Chronic Kidney Disease Stage IIIB- Improving   -Likely secondary to poor PO intake  -Creatinine 2.36 on presentation to outside facility (baseline approximately 1.1-1.2)  -US kidney showed no evidence of obstruction   -IV fluids discontinued due to oxygen requirement   -Avoid nephrotoxic agents     Acute Hypoxic Respiratory Failure    -Likely secondary to pneumonia   -Patient requiring 5L NC (does not require supplemental oxygen at baseline)  -CXR suggestive of multifocal pneumonia   - Continue Rocephin and Doxycyline.     Elevated Troponin  -Likely secondary to acute kidney injury   -Patient reports no chest pain   -Troponins peaked at 42.4   -EKG showed an  atrial paced rhythm with a non-specific T wave abnormality   -Continuous telemetry      Paroxysmal Atrial Fibrillation   -Currently rate controlled  -Continue Amiodarone 200mg  daily and Toprol XL 100mg  daily   -Continuous telemetry      Protein Calorie Malnutrition   Patient meets criteria based on the following clinical characteristics:   Malnutrition (Undernutrition) Diagnosis  Malnutrition Chronicity: Chronic illness  Malnutrition Severity: Severe protein calorie malnutrition  Related to: Chronic illness  Energy Intake: Less than 75% of EER in greater than or equal to 1 month  Body Fat Loss: Severe  Muscle Mass Loss:  Severe  Nutrition consulted  Nutrition Treatment Plan: Initiate oral supplement, Initiate vitamin/mineral supplement(s), Monitor weight trends  MNT protocol ordered  Monitor bowel functions  Continue Megace 800mg  daily   Lab Results   Component Value Date    ALBUMIN 2.2 (L) 02/12/2023     Hypertension   -Remains mildly hypertensive   -Continue Toprol XL 100mg  daily and Amlodipine increased to 10mg  daily   -Home medication- Losartan 50mg  BID - HELD for AKI     Hyperlipidemia   -Continue Lipitor 20mg  daily     Rheumatoid Arthritis   -Continue Methotrexate 10mg  weekly      Peptic Ulcer Disease   -Continue Protonix 40mg  daily       Nutrition: DIET CARDIAC Consistency/thickening: MECHANICAL SOFT (Ground meat/Soft foods); Instructional: POSITION AS UPRIGHT AS POSSIBLE (BETWEEN 45-90 DEGREES) UNLESS CONTRAINDICATED  DIETARY ORAL SUPPLEMENTS Product Name: Magic Cup - Vanilla; Frequency: LUNCH/DINNER; Number of Containers: 1 Each   DVT PPx: Heparin 5,000U SQ TID  Code Status: No CPR      Mila Homer Tah, MD, PGY-I  Case discussed with attending physician.     I have independently seen and examined the patient on the date of service.  I have discussed the history, presentation, and plan of care in detail with the resident..  I have reviewed the document above for medical content and agree as documented.  Not all grammatical errors, misspellings, transcription misinterpretations, and punctuation errors have been corrected.     Exam:  Cardiovascular: Agree as documented  Respiratory: Agree as documented  Leonard Downing MD  This note may have been partially generated using MModal Fluency Direct system, and there may be some incorrect words, spellings, and punctuation that were not noted in checking the note before saving.     End of Note

## 2023-02-12 NOTE — Care Plan (Signed)
[facility]  Rehabilitation Services  Occupational Therapy Progress Note    Patient Name: Theresa Greene  Date of Birth: 15-Aug-1934  Height:  160 cm (5\' 3" )  Weight:  53.1 kg (117 lb)  Room/Bed: 328/A  Payor: MEDICARE / Plan: MEDICARE PART A AND B / Product Type: Medicare /     Assessment:    Pt tolerated treatment fairly, continuing to be limited by confusion, weakness, impaired balance, and impaired activity tolerance. Pt may benefit from continued OT services to improve IND/safety w/ ADLs, functional mobility, and functional transfers.     Discharge Disposition: SNF, TBD       02/12/23 1023   Therapist Pager   OT Assigned/ Pager # [phone]   Rehab Session   Document Type therapy progress note (daily note)   Total OT Minutes: 23   Daily Activity AM-PAC/6-clicks Score   Putting on/Taking off clothing on lower body 3   Bathing 3   Toileting 3   Putting on/Taking off clothing on upper body 4   Personal grooming 4   Eating Meals 4   Raw Score Total 21   Standardized (t-scale) Score 44.27   CMS 0-100% Score 32.79   CMS Modifier CJ           Plan:   Continue to follow patient according to established plan of care.  The risks/benefits of therapy have been discussed with the patient/caregiver and he/she is in agreement with the established plan of care.     Subjective & Objective:     RN cleared pt for OT session. Pt was supine in bed, co-tx w/ PT to maximize safety/performance. Pt t/f supine to EOB w/ MOD A x 2, stood w/ MOD A x 2, ambulated x ~3-4 feet w/ MOD A x 2 using FWW and requested to return to EOB d/t symptoms of nausea. Pt rested at EOB, stood w/ MOD A x 2 using FWW, cues to correct posterior lean. Pt took a few lateral steps w/ MOD A x 2 using FWW, returned to supine w/ MOD A x 2. Pt was left seated in bed, call button within reach and bed alarm set for safety. All lines intact and needs met at this time. Pt was more confused throughout session, RN notified.     Therapist:   Celesta Gentile,  MOTR/L     Start Time: 1000  End Time: 1023  Total Treatment Time: 23 minutes  Charges Entered: 2 OT TA  Department Number: [id number]

## 2023-02-13 ENCOUNTER — Inpatient Hospital Stay (HOSPITAL_COMMUNITY): Payer: Medicare Other

## 2023-02-13 ENCOUNTER — Other Ambulatory Visit: Payer: Self-pay

## 2023-02-13 DIAGNOSIS — K279 Peptic ulcer, site unspecified, unspecified as acute or chronic, without hemorrhage or perforation: Secondary | ICD-10-CM

## 2023-02-13 DIAGNOSIS — M069 Rheumatoid arthritis, unspecified: Secondary | ICD-10-CM

## 2023-02-13 LAB — COMPREHENSIVE METABOLIC PANEL, NON-FASTING
ALBUMIN: 2.1 g/dL — ABNORMAL LOW (ref 3.4–4.8)
ALKALINE PHOSPHATASE: 77 U/L (ref 55–145)
ALT (SGPT): 28 U/L — ABNORMAL HIGH (ref 8–22)
ANION GAP: 12 mmol/L (ref 4–13)
AST (SGOT): 46 U/L — ABNORMAL HIGH (ref 8–45)
BILIRUBIN TOTAL: 0.4 mg/dL (ref 0.3–1.3)
BUN/CREA RATIO: 28 — ABNORMAL HIGH (ref 6–22)
BUN: 45 mg/dL — ABNORMAL HIGH (ref 8–25)
CALCIUM: 10.7 mg/dL — ABNORMAL HIGH (ref 8.6–10.3)
CHLORIDE: 106 mmol/L (ref 96–111)
CO2 TOTAL: 25 mmol/L (ref 23–31)
CREATININE: 1.59 mg/dL — ABNORMAL HIGH (ref 0.60–1.05)
ESTIMATED GFR - FEMALE: 32 mL/min/BSA — ABNORMAL LOW (ref >=60)
GLUCOSE: 118 mg/dL (ref 65–125)
POTASSIUM: 3 mmol/L — ABNORMAL LOW (ref 3.5–5.1)
PROTEIN TOTAL: 5.1 g/dL — ABNORMAL LOW (ref 6.0–8.0)
SODIUM: 143 mmol/L (ref 136–145)

## 2023-02-13 LAB — CBC WITH DIFF
HCT: 33.8 % — ABNORMAL LOW (ref 34.8–46.0)
HGB: 11.5 g/dL (ref 11.5–16.0)
MCH: 31.3 pg (ref 26.0–32.0)
MCHC: 34 g/dL (ref 31.0–35.5)
MCV: 91.8 fL (ref 78.0–100.0)
MPV: 11.7 fL (ref 8.7–12.5)
PLATELETS: 336 10*3/uL (ref 150–400)
RBC: 3.68 10*6/uL — ABNORMAL LOW (ref 3.85–5.22)
RDW-CV: 17.1 % — ABNORMAL HIGH (ref 11.5–15.5)
WBC: 11.5 10*3/uL — ABNORMAL HIGH (ref 3.7–11.0)

## 2023-02-13 LAB — MANUAL DIFF AND MORPHOLOGY-SYSMEX
BASOPHIL #: <0.1 10*3/uL (ref <=0.20)
BASOPHIL %: 0 %
EOSINOPHIL #: 0.12 10*3/uL (ref <=0.50)
EOSINOPHIL %: 1 %
LYMPHOCYTE #: 0.23 10*3/uL — ABNORMAL LOW (ref 1.00–4.80)
LYMPHOCYTE %: 2 %
MONOCYTE #: 0.12 10*3/uL — ABNORMAL LOW (ref 0.20–1.10)
MONOCYTE %: 1 %
NEUTROPHIL #: 11.04 10*3/uL — ABNORMAL HIGH (ref 1.50–7.70)
NEUTROPHIL %: 96 %
RBC MORPHOLOGY: NORMAL

## 2023-02-13 LAB — MAGNESIUM: MAGNESIUM: 2 mg/dL (ref 1.8–2.6)

## 2023-02-13 LAB — PHOSPHORUS: PHOSPHORUS: 3.8 mg/dL (ref 2.3–4.0)

## 2023-02-13 LAB — SEDIMENTATION RATE: ERYTHROCYTE SEDIMENTATION RATE (ESR): 34 mm/hr — ABNORMAL HIGH (ref 0–20)

## 2023-02-13 LAB — POTASSIUM: POTASSIUM: 3.3 mmol/L — ABNORMAL LOW (ref 3.5–5.1)

## 2023-02-13 LAB — CREATINE KINASE (CK), TOTAL, SERUM OR PLASMA: CREATINE KINASE: 45 U/L (ref 25–190)

## 2023-02-13 MED ORDER — CALCITONIN (SALMON) 200 UNIT/ML INJECTION SOLUTION
4.0000 [IU]/kg | Freq: Once | INTRAMUSCULAR | Status: AC
Start: 2023-02-13 — End: 2023-02-13
  Administered 2023-02-13: 200 [IU] via SUBCUTANEOUS
  Filled 2023-02-13: qty 2

## 2023-02-13 MED ORDER — SENNOSIDES 8.6 MG-DOCUSATE SODIUM 50 MG TABLET
1.0000 | ORAL_TABLET | Freq: Two times a day (BID) | ORAL | Status: DC
Start: 2023-02-13 — End: 2023-02-18
  Administered 2023-02-13: 0 via ORAL
  Administered 2023-02-13: 1 via ORAL
  Administered 2023-02-14: 0 via ORAL
  Administered 2023-02-14 – 2023-02-18 (×8): 1 via ORAL
  Filled 2023-02-13 (×11): qty 1

## 2023-02-13 MED ORDER — POTASSIUM BICARBONATE-CITRIC ACID 20 MEQ EFFERVESCENT TABLET
40.0000 meq | EFFERVESCENT_TABLET | ORAL | Status: DC
Start: 2023-02-13 — End: 2023-02-15
  Administered 2023-02-13: 0 meq via ORAL
  Filled 2023-02-13 (×2): qty 2

## 2023-02-13 NOTE — Care Plan (Signed)
[facility]  Rehabilitation Services  Speech Therapy Initial Swallow Evaluation     Patient Name: Theresa Greene  Date of Birth: 12/26/1934  Height: Height: 160 cm (5\' 3" )  Weight: Weight: 53.1 kg (117 lb)  Room/Bed: 328/A  Payor: MEDICARE / Plan: MEDICARE PART A AND B / Product Type: Medicare /      Assessment:  SLP Swallowing Diagnosis: moderate dysphagia 2' ongoing fatigue and weakness, esophageal dysphagia.   SLP Diet Recommendation: thin liquids, puree, medications crushed in puree.   Aspiration Precautions: encourage use of dentures, set-up and prepare tray, provide assist with feeding  Signs/Symptoms of Aspiration Noted (Swallowing): none       Discharge Needs:  Discharge Disposition: home with assist (will need Justification of D/C recommendation if placement is required.)    JUSTIFICATION OF DISCHARGE RECOMMENDATION   Based on current diagnosis, functional performance prior to admission, and current functional performance, this patient requires continued SLP services in home with assist in order to achieve significant functional improvements for Swallowing.       Plan:  To provide Speech Therapy services other (see comments) (1-2 times/wk) for duration of 5-7 days .        The risks/benefits of therapy have been discussed with the patient/caregiver and he/she is in agreement with the established plan of care.         Subjective & Objective   02/13/23 1400   Therapist Pager   SLP Pager Puree/thin   Rehab Session   Document Type re-evaluation   SLP Visit Date 02/13/23   Total SLP Minutes: 17   Patient Effort good   General Information   Patient Profile Reviewed yes   General Observations of Patient Pt positively idenitified, O2 via NC, A&Ox4. Daughter, husband and neighbor present during the session.   Pertinent History of Current Functional Problem Pt reported poor appetite and unintentional weight loss. Re-evaluation d/t decline in status.   Mutuality/Individual Preferences    Individualized Care Needs Goes by Jolyn Lent of Care Reviewed With patient   Functional Status Prior   Eating 0 - independent   Communication 0 - understands/communicates without difficulty   Swallowing 2 - difficulty swallowing liquids/foods   Prior Functional Level Comment Pt reports a diet of soft solids/thin liquids at baseline. Pt with history of esophageal dysphagia.   Coping/Psychosocial   Observed Emotional State calm;cooperative   Cognitive   Orientation oriented x 4   Cognitive Assessment/Interventions   Behavior/Mood Observations cooperative   Orientation Status oriented x 4   Attention WNL/WFL   Follows Commands WFL   Pain Assessment   Pre/Posttreatment Pain Comment No c/o pain.   Oxygenation   Oxygenation Nasal cannula   Oral Motor Structure and Function    Additional Documentation Oral Motor Structure/Functional Assessment (Group)   Oral Motor Structure/Functional Assessment   Dentition (Oral Motor) upper dentures   Mucosal Quality (Oral Motor Assessment) good   Oral Musculature (Oral Motor Assessment) WFL   Secretion Management (Oral Motor Assessment) WFL   Velar Elevation (Oral Motor) WFL   Volitional Cough (Oral Motor Assessment) weak volitional cough   Non Instrumental/Clinical Swallow (NIS)   Additional Documentation Thin Liquid Trial (NIS) (Group);Puree Texture Trial (NIS) (Group)   Thin Liquid Trial (NIS)   Mode of Presentation, Thin Liquid (NIS) fed by clinician;straw   Oral Phase Results, Thin Liquid (NIS) intact oral phase without signs of dysfunction   Pharyngeal Phase Results, Thin Liquid (NIS) safe swallow, no signs/symptoms of  aspiration or penetration   Volume Presented in mL, Thin Liquid (NIS) patient controlled volumes   Puree Texture Trial (NIS)   Mode of Presentation, Puree (NIS) fed by clinician;spoon   Oral Phase Results, Puree (NIS) intact oral phase without signs of dysfunction   Pharyngeal Phase Results, Puree (NIS) safe swallow, no signs/symptoms of aspiration or penetration    Volume Presented in mL, Puree (NIS) other (see comments)  (Applesauce bites x5)   Semi-Solid Texture Trial (NIS)   Comment, Semi-Solid (NIS) DID NOT TEST; pt not appropriate for soft solids at this time 2' poor mealtime endurance   Plan of Care Review   Plan Of Care Reviewed With patient   Swallowing Clinical Impression   SLP Swallowing Diagnosis moderate dysphagia   Criteria for Skilled Therap Interv Met demonstrates skilled criteria for intervention   Therapy Frequency other (see comments)  (1-2 times/wk)   Predicted Duration Therapy Interv (days) 5-7 days   Expected Duration Therapy Session - minutes 15-30 minutes   SLP Diet Recommendation thin liquids;puree   Recommended Feeding/Eating Techniques (Swallow Eval) encourage use of dentures;set-up and prepare tray;provide assist with feeding   Signs/Symptoms of Aspiration Noted (Swallowing) none   Plan of care reviewed with: Patient;RN;Family   Dysphagia Goals, SLP   Date Established (Dysphagia Goal, SLP) 02/13/23   Time Frame (Dysphagia Goal, SLP) 1 wk   Activity (Dysphagia Goal, SLP) Pt will participate in PO trials without s/s of aspiration with 90% accuracy. Pt will adhere to general aspiration precautions with 90% accuracy.   Applied Cognition Am PAC/6-clicks Score   Following/understand 10-98min speech or presentation 3   Understanding familiar people during ordinary conversations 3   Remembering to take medications at the appropriate time? 2   Remembering  where things were place/put away 2   Remembering a list of 4/5 errands without writing it down 2   Taking care of complicated tasks (managing a checking acct) 2   Raw Score Total 14   Standardized (t-scale) score 32.02   CMS 0-100% Score 55.37   CMS Modifier CK     ST student, Kylie participated in the delivery of service to this patient under the direct supervision of Katelyn Bond CCC-SLP and with this clinician fully directing the service and making a skilled judgement regarding skilled service-100%  supervision.      Therapist:  Rockwell Alexandria, Speech Therapy Extern  Start Time: 1:42  End Time: 1:59  Evaluation Time: 17 minutes  Charges Entered: SPSE  Department Number: [phone]

## 2023-02-13 NOTE — Progress Notes (Signed)
Progress Note     Theresa Greene 88 y.o. female  Date of Birth: 08-Mar-1935  Date of Admit:  02/07/2023   Date of Service: 02/13/2023    Attending: Leonard Downing, MD Code Status:No CPR   PCP: Clydia Llano, DO      Summary:  Theresa Greene is an 88 year old female with a past medical history of paroxysmal atrial fibrillation, hypertension, hyperlipidemia, rheumatoid arthritis, CKD stage IIIB who presented as a transfer from [facility]. The patient initially presented to [facility] for generalized weakness. She stated that over the last two weeks she has had progressive weakness to the point that she required help to go to the restroom. She also admitted to falling in her bathroom on to the tile floor. She denied any injuries from her fall. At baseline she is typically able to ambulate with her walker. She also admitted to decreased oral intake over this same time period. The patient noted that she was diagnosed with COVID-19 in 2020 and has lost about 50lbs since this time due to decreased appetite and attributes that as a cause of her generalized weakness. She denied smoking history and stated that she is up to date on her cancer screenings. She denies any chest pain, shortness of breath, nausea, vomiting, or diarrhea. She was hypertensive on arrival to the ED at 134/33mmHg but was otherwise hemodynamically stable. Labs at the outside facility were significant for creatinine 2.36 (baseline approximately 1.1-1.2) and mildly elevated troponins at 31.2 --> 32.3. CXR showed no acute cardiopulmonary process. At the outside facility she received aspirin 324mg , hydralazine IV 10mg , 1.5L NS bolus, and was started on maintenance fluids of NS 100cc/hr. The patient was subsequently transferred to [facility] as [facility] did not have any beds available. She was admitted to the academic medicine team for further evaluation and management. Patient's renal function is improving with fluid resuscitation. She was noted  to have hypercalcemia on her labs. Monoclonal gammopathy work-up was sent and Calcitriol was held. Patient continues to have elevated corrected calcium levels so calcitonin was ordered. Patient appears to be confused periodically throughout the day, this could be secondary to high calcium levels or prolonged hospital stay.     Interval History:    Patient's weakness has increased. She was having trouble drinking from a straw today and going to the washroom. Due to her decline, neurology has been consulted, and CT brain, abdomen and chest have been ordered.  Palliative has also been consulted. Monoclonal gammopathy work-up came back today and showed no M-protein and a normal kappa/lamda ratio. Serum calcium remains high so calcitonin was given again. Patient remains on antibiotics for pneumonia. Family updated extensively at beside today.    Review of Systems:   Negative unless otherwise stated above.       Objective    Medical History     PMHx:    Past Medical History:   Diagnosis Date    Arthritis     Asthma     Back problem     CAD S/P percutaneous coronary angioplasty 2010    Pt reports 5 years ago    Charcot's joint of foot, left 08/06/2014    Colon polyp     CVA (cerebrovascular accident) (CMS HCC) 1995    no residual deficits    Deaf     Diverticulitis of colon     ESBL (extended spectrum beta-lactamase) producing bacteria infection 02/08/2022    ESBL + Klebsiella pneumoniae urine 03/22/22, 02/08/22  Gastroesophageal reflux disease 08/06/2014    GI bleed     H/O hearing loss     History of cardiac pacemaker 08/06/2014    HTN (hypertension)     Hyperlipidemia 12/25/2019    Muscle weakness     Neuropathy (CMS HCC)     Pacemaker     Paroxysmal atrial fibrillation (CMS HCC) 08/06/2014    PUD (peptic ulcer disease) 08/06/2014    C/b GIB Spring 2015    Rheumatoid arthritis (CMS HCC) 08/06/2014    Stage 3 chronic kidney disease (CMS HCC) 12/25/2019    Last Assessment & Plan:  Renal function stable, at baseline Stable  electrolytes; restart sodium bicarb 650 mg BID  Continue renal protective ARB Discussed CKD risk factor modifications, including tight BP/glucose control, low protein diet, and avoidance of NSAIDs    Stented coronary artery     Vitamin D deficiency 12/25/2019    Wears glasses       Allergies:    Allergies   Allergen Reactions    Iodinated Contrast Media Anaphylaxis    Iv Contrast Anaphylaxis     makese her serverly sick    Etodolac NO Steroids unless approved by Attending Physician    Symbicort [Budesonide-Formoterol]     Codeine  Other Adverse Reaction (Add comment) and Nausea/ Vomiting     Stomach pain  Stomach pain       Social History  Social History     Tobacco Use    Smoking status: Never    Smokeless tobacco: Never   Vaping Use    Vaping status: Never Used   Substance Use Topics    Alcohol use: Never    Drug use: Never     Family History  Family Medical History:       Problem Relation (Age of Onset)    Brain cancer Mother    Ovarian Cancer Mother    Pancreatic Cancer Father           Home Meds:   Dexlansoprazole, acetaminophen, amLODIPine, amiodarone, aspirin, atorvastatin, calcitrioL, cephalexin, ergocalciferol (vitamin D2), folic acid, gabapentin, lipase-protease-amylase, losartan, lysine, melatonin, methotrexate, metoprolol succinate, mirabegron, nitroGLYCERIN, sertraline, and sodium bicarbonate         Objective Findings     Physical Exam:  BP 118/80   Pulse 74   Temp 36.5 C (97.7 F)   Resp 16   Ht 1.6 m (5\' 3" )   Wt 53.1 kg (117 lb)   SpO2 97%   BMI 20.73 kg/m     General: Adult female who appears stated age, no apparent distress, cachetic, chronically ill appearing   HEENT:  Normocephalic/atraumatic  Cardio: RRR, no murmurs heard, no peripheral edema  Resp: Bilateral crackles   Abd: Soft, NT, ND, Positive BS  Skin: Warm and dry. No lesions or rashes noted.   Neuro: Alert and oriented x3, CN II-XII grossly intact. Decreased muscle strength. No sensory deficits.     Summary of Lab Work and  Diagnostic Studies:     CBC   Recent Labs     02/11/23  0542 02/12/23  0629 02/13/23  0609   WBC 5.9 10.0 11.5*   HGB 10.6* 12.2 11.5   HCT 33.1* 36.2 33.8*   PLTCNT 220 264 336   BANDS  --  2  --       Chemistries   Recent Labs     02/11/23  0542 02/12/23  0629 02/13/23  0609 02/13/23  1304   SODIUM 139 137 143  --  POTASSIUM 3.7 3.6 3.0* 3.3*   CHLORIDE 108 107 106  --    CO2 21* 19* 25  --    BUN 31* 38* 45*  --    CREATININE 1.63* 1.62* 1.59*  --    CALCIUM 10.5* 11.0* 10.7*  --    ALBUMIN 2.1* 2.2* 2.1*  --    MAGNESIUM 2.1 1.9 2.0  --    PHOSPHORUS 2.6 3.4 3.8  --       Liver Enzymes   Recent Labs     02/11/23  0542 02/12/23  0629 02/13/23  0609   TOTALPROTEIN 4.9* 5.3* 5.1*   ALBUMIN 2.1* 2.2* 2.1*   AST 43 63* 46*   ALT 17 29* 28*   ALKPHOS 73 78 77      Inflammatory Markers   Recent Labs     02/13/23  1304   ESR 34*      PROCALCITONIN   Cardiac and Coags   No results for input(s): "UHCEASTTROPI", "BNP", "INR" in the last 72 hours.    Invalid input(s): "PTT", "PT"   Lipid Panel   No results for input(s): "HDL" in the last 72 hours.    Invalid input(s): "LDL", "CHOL"                   Assessment & Plan     Active Hospital Problems    Diagnosis    Primary Problem: Elevated troponin    Generalized weakness    Acute kidney injury (CMS HCC)    Stage 3b chronic kidney disease (CMS HCC)    Hyperlipidemia    HTN (hypertension)    Rheumatoid arthritis (CMS HCC)    PUD (peptic ulcer disease)    Paroxysmal atrial fibrillation (CMS HCC)       Generalized Weakness / Fall/ Failure to Thrive  -Patient presented to outside facility due to progressive generalized weakness and fall  -Likely secondary to chronic deconditioning, malnutrition, and poor PO intake   -PT/OT/Speech therapy ordered  -Fall precautions in place   - Neurology consulted due to further increase in weakness. CT Brain, abdomen/pelvis and chest ordered to rule out malignancy.  - CK was 45, normal.  - ESR was high, 34.    Community Acquired Pneumonia  (Suspected Bacterial, Unknown Organism)   -Patient spiked fever of 37.8 overnight on 02/10/23  -Procalcitonin 0.43, normal  -Respiratory viral panel was negative   -Streptococcus pneumonia antigen, legionella antigen, and MRSA screen negative  - UA screen negative  - Chest xray showed bilateral multifocal pneumonia  - Continue Rocephin (2 mg IV once daily) and Doxycyline (100 mg IV twice daily)    Acute Metabolic Encephalopathy  - Likely secondary to elevated corrected calcium levels, or prolonged hospital stay or infection  - UA was not suggestive of infection  - Chest xray revealed pneumonia, patient started on antibiotics.   - Miacalcin 200 units SC x2  - Strict delirium precautions in place.    Hypercalcemia  -Potential etiologies include mediation vs malignancy   -Corrected calcium 12.2   -PTH low at 7.1  - Given Miacalcin 200 units SC x2  -Monoclonal gammopathy work-up showed no M-protein and normal kappa/lamda ratio  -Fluids discontinued due to oxygen requirement   -Lasix 40mg  IV x1 dose   -Home medication- Calcitriol 0.22mcg three times weekly- HELD      Acute Kidney Injury on Chronic Kidney Disease Stage IIIB- Improving   -Likely secondary to poor PO intake  -Creatinine 2.36 on presentation to  outside facility (baseline approximately 1.1-1.2)  - Cr was 1.59 today  -US kidney showed no evidence of obstruction   -IV fluids discontinued due to oxygen requirement   -Avoid nephrotoxic agents     Acute Hypoxic Respiratory Failure    -Likely secondary to pneumonia   -Patient requiring 5L NC (does not require supplemental oxygen at baseline)  -CXR suggestive of multifocal pneumonia   - Continue Rocephin and Doxycyline.     Elevated Troponin  -Likely secondary to acute kidney injury   -Patient reports no chest pain   -Troponins peaked at 42.4   -EKG showed an atrial paced rhythm with a non-specific T wave abnormality   -Continuous telemetry      Paroxysmal Atrial Fibrillation   -Currently rate controlled  -Continue  Amiodarone 200mg  daily and Toprol XL 100mg  daily   -Continuous telemetry      Protein Calorie Malnutrition   Patient meets criteria based on the following clinical characteristics:   Malnutrition (Undernutrition) Diagnosis  Malnutrition Chronicity: Chronic illness  Malnutrition Severity: Severe protein calorie malnutrition  Related to: Chronic illness  Energy Intake: Less than 75% of EER in greater than or equal to 1 month  Body Fat Loss: Severe  Muscle Mass Loss: Severe  Nutrition consulted  Nutrition Treatment Plan: Initiate oral supplement, Initiate vitamin/mineral supplement(s), Monitor weight trends  MNT protocol ordered  Monitor bowel functions  Continue Megace 800mg  daily   Lab Results   Component Value Date    ALBUMIN 2.1 (L) 02/13/2023     Hypertension   -Remains mildly hypertensive   -Continue Toprol XL 100mg  daily and Amlodipine increased to 10mg  daily   -Home medication- Losartan 50mg  BID - HELD for AKI     Hyperlipidemia   -Continue Lipitor 20mg  daily     Rheumatoid Arthritis   -Continue Methotrexate 10mg  weekly      Peptic Ulcer Disease   -Continue Protonix 40mg  daily       Nutrition: DIET CARDIAC Consistency/thickening: MECHANICAL SOFT (Ground meat/Soft foods); Instructional: POSITION AS UPRIGHT AS POSSIBLE (BETWEEN 45-90 DEGREES) UNLESS CONTRAINDICATED  DIETARY ORAL SUPPLEMENTS Product Name: Magic Cup - Vanilla; Frequency: LUNCH/DINNER; Number of Containers: 1 Each   DVT PPx: Heparin 5,000U SQ TID  Code Status: No CPR      Mila Homer Tah, MD, PGY-I  Case discussed with attending physician.   I have independently seen and examined the patient on the date of service.  I have discussed the history, presentation, and plan of care in detail with the resident..  I have reviewed the document above for medical content and agree as documented.  Not all grammatical errors, misspellings, transcription misinterpretations, and punctuation errors have been corrected.     Exam:  Cardiovascular: Agree as  documented  Respiratory: Agree as documented  Leonard Downing MD    This note may have been partially generated using MModal Fluency Direct system, and there may be some incorrect words, spellings, and punctuation that were not noted in checking the note before saving.     End of Note

## 2023-02-13 NOTE — Care Plan (Signed)
Problem: Adult Inpatient Plan of Care  Goal: Plan of Care Review  Outcome: Ongoing (see interventions/notes)  Goal: Patient-Specific Goal (Individualized)  Outcome: Ongoing (see interventions/notes)  Goal: Absence of Hospital-Acquired Illness or Injury  Outcome: Ongoing (see interventions/notes)  Intervention: Identify and Manage Fall Risk  Recent Flowsheet Documentation  Taken 02/12/2023 2013 by Sharyn Creamer, RN  Safety Promotion/Fall Prevention: activity supervised  Intervention: Prevent Skin Injury  Recent Flowsheet Documentation  Taken 02/12/2023 2013 by Sharyn Creamer, RN  Body Position: side lying, right  Skin Protection: adhesive use limited  Intervention: Prevent and Manage VTE (Venous Thromboembolism) Risk  Recent Flowsheet Documentation  Taken 02/12/2023 2013 by Sharyn Creamer, RN  VTE Prevention/Management: ambulation promoted  Intervention: Prevent Infection  Recent Flowsheet Documentation  Taken 02/12/2023 2013 by Sharyn Creamer, RN  Infection Prevention: rest/sleep promoted  Goal: Optimal Comfort and Wellbeing  Outcome: Ongoing (see interventions/notes)  Intervention: Provide Person-Centered Care  Recent Flowsheet Documentation  Taken 02/12/2023 2013 by Sharyn Creamer, RN  Trust Relationship/Rapport:   care explained   choices provided   empathic listening provided   emotional support provided   questions answered   questions encouraged   reassurance provided   thoughts/feelings acknowledged  Goal: Rounds/Family Conference  Outcome: Ongoing (see interventions/notes)     Problem: Health Knowledge, Opportunity to Enhance (Adult,Obstetrics,Pediatric)  Goal: Knowledgeable about Health Subject/Topic  Description: Patient will demonstrate the desired outcomes by discharge/transition of care.  Outcome: Ongoing (see interventions/notes)  Intervention: Enhance Health Knowledge  Recent Flowsheet Documentation  Taken 02/12/2023 2013 by Sharyn Creamer, RN  Family/Support System Care: self-care  encouraged  Supportive Measures: active listening utilized     Problem: Skin Injury Risk Increased  Goal: Skin Health and Integrity  Outcome: Ongoing (see interventions/notes)  Intervention: Optimize Skin Protection  Recent Flowsheet Documentation  Taken 02/12/2023 2013 by Sharyn Creamer, RN  Pressure Reduction Techniques: Frequent weight shifting encouraged  Pressure Reduction Devices: Repositioning wedges/pillows utilized  Skin Protection: adhesive use limited  Head of Bed (HOB) Positioning: HOB at 20-30 degrees     Problem: Oral Intake Inadequate  Goal: Improved Oral Intake  Outcome: Ongoing (see interventions/notes)     Problem: Malnutrition  Goal: Improved Nutritional Intake  Outcome: Ongoing (see interventions/notes)

## 2023-02-13 NOTE — Care Plan (Signed)
[facility]  Rehabilitation Services  Occupational Therapy Progress Note    Patient Name: Theresa Greene  Date of Birth: Mar 11, 1935  Height:  160 cm (5\' 3" )  Weight:  53.1 kg (117 lb)  Room/Bed: 328/A  Payor: MEDICARE / Plan: MEDICARE PART A AND B / Product Type: Medicare /     Assessment:    Pt tolerated treatment fairly, continuing to be limited by weakness, impaired balance, confusion, and impaired activity tolerance. Pt may benefit from continued OT services to improve IND/safety w/ ADLs, functional mobility, and functional transfers.          02/13/23 1035   Therapist Pager   OT Assigned/ Pager # [phone]   Rehab Session   Document Type therapy progress note (daily note)   Total OT Minutes: 23   Daily Activity AM-PAC/6-clicks Score   Putting on/Taking off clothing on lower body 3   Bathing 3   Toileting 3   Putting on/Taking off clothing on upper body 4   Personal grooming 4   Eating Meals 4   Raw Score Total 21   Standardized (t-scale) Score 44.27   CMS 0-100% Score 32.79   CMS Modifier CJ           Plan:   Continue to follow patient according to established plan of care.  The risks/benefits of therapy have been discussed with the patient/caregiver and he/she is in agreement with the established plan of care.     Subjective & Objective:     RN cleared pt for OT session. Pt was supine in bed, co-tx w/ PT to maximize safety/performance. Pt fatigued this date, but was less confused compared to yesterday (knew place and year). Pt t/f supine to EOB w/ MIN A x 2, sat at EOB for a few mins to acclimate to upright. Pt completed grooming task of brushing hair w/ MIN/MOD A for task effectiveness. Pt required cues for forward flexed posture. Pt stood w/ MIN A x 2 using FWW for support. Pt rested at EOB and then stood w/ MIN A x 2 using FWW for support. Pt t/f to BS chair w/ MIN A x 2 using FWW for support. Pt was left seated, call button within reach and chair alarm set for safety. All lines intact and  needs met at this time.     Therapist:   Celesta Gentile, MOTR/L     Start Time: 1012  End Time: 1035  Total Treatment Time: 23 minutes  Charges Entered: 2 OT TA  Department Number: [id number]

## 2023-02-13 NOTE — Consults (Signed)
[facility]                                                    PALLIATIVE CARE CONSULT    Date of Service:  02/13/2023    Requesting MD: Leonard Downing, MD     Reason for Consult:  goal clarification and communication with patient or family    Advance Directives prior to consultation:  MPOA- Yes, Name: Roselle Locus (spouse); Successor Representative: Edison Pace (daughter) / Nyoka Lint (daughter) and Phone Number [name] - [phone] or [phone]; Vikki Ports - [phone]; Rinaldo Cloud -  [phone] Living Will- Yes; Healthcare Surrogate: No    There is a combined MPOA/LW document available to view in Epic. This document was completed in 08/2007. Please see document for details.    There is a newer LW that was completed in 01/2023. Please see document for details.    Discussion with patient and family included: current problems, pt/family goals for care, and AD    Location of pt/family meeting:  bedside    Outcome of pt/family meeting: No CPR    Patient/Family primary goal(s) include: continue present treatment    HPI/Discussion:      This is a 88 year old female patient with a PMH significant for paroxysmal atrial fibrillation, hypertension, rheumatoid arthritis, chronic kidney disease stage IIIB, and other medical problems who was a transfer from [facility] related to generalized weakness per H&P.  Current hospital problems include generalized weakness/fall/failure to thrive, community-acquired pneumonia, acute metabolic encephalopathy, hypercalcemia, acute kidney injury on chronic kidney disease, acute hypoxic respiratory failure, and other medical problems per chart review.  Current hospital duration of 6 days.  Neurology has been consulted today.  Please see H&P, progress notes, and consult notes for full PMH, assessment, and treatment plan.    Palliative Care consult is in place for communication with the patient or family and goal clarification.  Received an update from  Sam, RN prior to the visit.    Met with the patient, her husband/MPOA, [name], and daughter/successor MPOA, [name], and one other family member at this time.  Introduced self and role.  The patient's medical problems and treatment plan were reviewed.  The family reviewed the patient's poor appetite, weakness, and change in mental status.  [name] has noticed a decline in the patient over the last several days.  The patient was able to participate at times during the discussion and answered questions appropriately.  The plan is for imaging today as well as a Neurology consult.  The patient denies any pain or discomfort.  [name] reports the patient's left leg has been bothering her for the last month or so.  The patient lives at home with [name].  They have been married for over 60 years.  [name] reports the patient is the "boss" at home.  The patient and [name] do have some caregiver support at home as well. The family is looking into SNF placement for rehab when the patient is medically ready.  The RN CM / SW are following to assist with discharge planning needs.  The family confirms the MPOA on file.  They also confirm the new LW that is on file.  Please see above for details.  [name] is a retired Insurance underwriter and wants to ensure the patient's No CPR code status wishes are known.  She thought the new LW would cover this.  We discussed the use of a LW, which specifically states if the patient is in a terminal condition or in a persistent vegetative state.  The family would like to complete an orange DNR card.  Assisted with completion during the visit as requested.  The orange DNR card was signed by the patient's spouse/MPOA, [name], and the attending provider. This card was faxed to the [facility] e-Directive Registry. The card will be sent to the [facility] office facilitator; will ask them to scan this into Epic. Instructed the family on where to place the document at home. Placed the top portion and a copy of the document on the patient's  physical chart. The family reports the goal is conservative treatment interventions, the patient does not want aggressive treatments.  [name] states she has talked with the patient about her wishes previously.  The patient wants a No CPR code status, no feeding tube, no ventilator support, and no aggressive treatment interventions.  At this time, the family is agreeable to the current treatment plan.  Provided the Palliative Care business card to the family if they have any questions or needs.  No needs expressed prior to departure.  [name] was tearful during parts of the discussion, he reports he is taking oral chemo which affects his emotions. Offered support.    Palliative Care will follow along as needed throughout the patient's hospitalization.    Past Family, Medical, and Social History and ROS see H&P on 02/07/2023 by Dr. Peterson Ao    Code Status:  No CPR    Time in Counseling as above:  30 minutes  Start time:  15:30  Stop Time:  16:00    Evon Slack, BSN, RN, [place]

## 2023-02-13 NOTE — Care Plan (Signed)
[facility]  Rehabilitation Services  Physical Therapy Progress Note      Patient Name: Theresa Greene  Date of Birth: 02-08-1935  Height:  160 cm (5\' 3" )  Weight:  53.1 kg (117 lb)  Room/Bed: 328/A  Payor: MEDICARE / Plan: MEDICARE PART A AND B / Product Type: Medicare /     Assessment:        02/13/23 1036   Rehab Session   Document Type therapy progress note (daily note)   PT Visit Date 02/13/23   Total PT Minutes: 23   Basic Mobility Am-PAC/6Clicks Score (APPROVED Staff)   Turning in bed without bedrails 3   Lying on back to sitting on edge of flat bed 3   Moving to and from a bed to a chair 3   Standing up from chair 3   Walk in room 2   Climbing 3-5 steps with railing 1   6 Clicks Raw Score total 15   Standardized (t-scale) score 36.97     Pt. With improved cognition this date, just very weak and fatigued. Unable to drink from straw (close lips around straw) and quiet, breathy speech due to weakness. Sitting EOB limited to a few minutes due to fatigue. Bed mobility and transfers, taking a few steps to chair with min A x2 and WW with significant fatigue noted with return to sit. Requested a speech re-eval due to increased weakness compared to last eval. Pt. Was able to ambulate to/from doorway and go into bathroom last week.      D/C Disposition:    SNF    Plan:   Continue to follow patient according to established plan of care.  The risks/benefits of therapy have been discussed with the patient/caregiver and he/she is in agreement with the established plan of care.     Subjective & Objective:     Pt. Cleared for treatment by Doreatha Martin, RN. Co-treat with OT to maximize functional mobility, participation and safety. Improved orientation, appropriate direction following but difficulty to understand due to very quiet, breathy voice. Transferred supine to sit with min A with improved ability to achieve EOB. Leaning over, holding head in hands due to fatigue. Sit to stand with min A x2 and Vcs for hand  placement to [employer]. Standing x 30 seconds, returned to sitting. Following short rest break, returned to standing with min A x2, took several steps and turned to sit on chair with [employer] and min A x2. [place] alarm set, call light in reach. Offered drink with pt. Able to hold cup with light assist but unable to drink from straw. Requried min A for drinking from cup without lid. RN notified of decline in strength, fatigue, min improved cognition this date.     Therapist:   Salome Spotted, MPT  [phone]    Start Time: 1012  End Time: 1035  AM Treatment Time: 23 minutes    Total Treatment Time: 23 minutes  Charges Entered: TAx2  Department Number: [id number]

## 2023-02-13 NOTE — Care Management Notes (Signed)
Patient has developed pneumonia and is not medically ready for discharge. [facility] was notified and will likely lose the bed due to patient not being d/c ready. Will need to re-refer when patient is closer to d/c readiness.   Joseph Art, BSW

## 2023-02-14 DIAGNOSIS — J9601 Acute respiratory failure with hypoxia: Secondary | ICD-10-CM

## 2023-02-14 DIAGNOSIS — R531 Weakness: Secondary | ICD-10-CM

## 2023-02-14 DIAGNOSIS — J189 Pneumonia, unspecified organism: Secondary | ICD-10-CM

## 2023-02-14 DIAGNOSIS — Z6820 Body mass index (BMI) 20.0-20.9, adult: Secondary | ICD-10-CM

## 2023-02-14 DIAGNOSIS — R627 Adult failure to thrive: Secondary | ICD-10-CM

## 2023-02-14 DIAGNOSIS — R7989 Other specified abnormal findings of blood chemistry: Secondary | ICD-10-CM

## 2023-02-14 DIAGNOSIS — E43 Unspecified severe protein-calorie malnutrition: Secondary | ICD-10-CM

## 2023-02-14 LAB — MANUAL DIFF AND MORPHOLOGY-SYSMEX
BASOPHIL #: <0.1 10*3/uL (ref <=0.20)
BASOPHIL %: 0 %
EOSINOPHIL #: <0.1 10*3/uL (ref <=0.50)
EOSINOPHIL %: 0 %
LYMPHOCYTE #: 0.31 10*3/uL — ABNORMAL LOW (ref 1.00–4.80)
LYMPHOCYTE %: 4 %
MONOCYTE #: <0.1 10*3/uL — ABNORMAL LOW (ref 0.20–1.10)
MONOCYTE %: 0 %
NEUTROPHIL #: 7.39 10*3/uL (ref 1.50–7.70)
NEUTROPHIL %: 96 %
RBC MORPHOLOGY: NORMAL

## 2023-02-14 LAB — BASIC METABOLIC PANEL
ANION GAP: 11 mmol/L (ref 4–13)
BUN/CREA RATIO: 29 — ABNORMAL HIGH (ref 6–22)
BUN: 54 mg/dL — ABNORMAL HIGH (ref 8–25)
CALCIUM: 10.7 mg/dL — ABNORMAL HIGH (ref 8.6–10.3)
CHLORIDE: 107 mmol/L (ref 96–111)
CO2 TOTAL: 26 mmol/L (ref 23–31)
CREATININE: 1.86 mg/dL — ABNORMAL HIGH (ref 0.60–1.05)
ESTIMATED GFR - FEMALE: 26 mL/min/BSA — ABNORMAL LOW (ref >=60)
GLUCOSE: 115 mg/dL (ref 65–125)
POTASSIUM: 3.8 mmol/L (ref 3.5–5.1)
SODIUM: 144 mmol/L (ref 136–145)

## 2023-02-14 LAB — MAGNESIUM: MAGNESIUM: 2 mg/dL (ref 1.8–2.6)

## 2023-02-14 LAB — CBC WITH DIFF
HCT: 31.4 % — ABNORMAL LOW (ref 34.8–46.0)
HGB: 10.6 g/dL — ABNORMAL LOW (ref 11.5–16.0)
MCH: 31.2 pg (ref 26.0–32.0)
MCHC: 33.8 g/dL (ref 31.0–35.5)
MCV: 92.4 fL (ref 78.0–100.0)
MPV: 11.1 fL (ref 8.7–12.5)
PLATELETS: 313 10*3/uL (ref 150–400)
RBC: 3.4 10*6/uL — ABNORMAL LOW (ref 3.85–5.22)
RDW-CV: 17.2 % — ABNORMAL HIGH (ref 11.5–15.5)
WBC: 7.7 10*3/uL (ref 3.7–11.0)

## 2023-02-14 LAB — PHOSPHORUS: PHOSPHORUS: 3.5 mg/dL (ref 2.3–4.0)

## 2023-02-14 MED ORDER — MIRTAZAPINE 15 MG TABLET
7.5000 mg | ORAL_TABLET | Freq: Every evening | ORAL | Status: DC
Start: 2023-02-14 — End: 2023-02-18
  Administered 2023-02-14 – 2023-02-17 (×4): 7.5 mg via ORAL
  Filled 2023-02-14 (×4): qty 1

## 2023-02-14 MED ORDER — CALCITONIN (SALMON) 200 UNIT/ML INJECTION SOLUTION
4.0000 [IU]/kg | Freq: Once | INTRAMUSCULAR | Status: AC
Start: 2023-02-14 — End: 2023-02-14
  Administered 2023-02-14: 200 [IU] via SUBCUTANEOUS
  Filled 2023-02-14: qty 2

## 2023-02-14 MED ORDER — MIRTAZAPINE 15 MG TABLET
15.0000 mg | ORAL_TABLET | Freq: Every evening | ORAL | Status: DC
Start: 2023-02-14 — End: 2023-02-14

## 2023-02-14 MED ORDER — GABAPENTIN 100 MG CAPSULE
100.0000 mg | ORAL_CAPSULE | Freq: Every evening | ORAL | Status: DC
Start: 2023-02-15 — End: 2023-02-18
  Administered 2023-02-15 – 2023-02-17 (×3): 100 mg via ORAL
  Filled 2023-02-14 (×3): qty 1

## 2023-02-14 NOTE — Consults (Signed)
[facility]  Neurology Consult Note    02/14/2023       Name:  Theresa Greene, Theresa Greene  Age: 88 y.o.  Gender: female  Date of Admission:  02/07/2023  Date of Birth:  [birth year]    Code Status:        PCP: Clydia Llano, DO  Consulting physician:     Chief Complaint:  Progressive weakness  Reason for the consult:  Progressive weakness  History obtained from:  Daughter    ION:GEXBM Arvilla Homsey is a 88 y.o., White female with PMH of essential hypertension, hyperlipidemia, paroxysmal atrial fibrillation, rheumatoid arthritis and chronic kidney disease stage 3 presented as a transfer from outside hospital for the evaluation of progressive weakness for the past 1 year.  Daughter at bedside reports that at baseline patient does not have good appetite and she weighs always 110 lbs.  For the last 1 year her appetite has been very poor.  She also has severe osteoarthritis with superimposed rheumatoid arthritis?  And osteoporosis.  Few days ago patient had left ankle severe pain for which she went outside hospital who told her to take bedrest.  Three days ago patient tried to get up however unable to bear her weight so she fell down for which family taken to the outside hospital and eventually transferred to [facility].  Family denies any focal weakness, facial droop, visual disturbances.  She does have esophageal strictures with mild dysphagia especially to solid food.    Review of Systems:  Constitutional: Denies weight loss, fever and chills.  Eyes: Denies any changes in vision  HENT: Denies changes in hearing and ear discharge  Respiratory: Denies SOB and cough  CV: Denies palpitations and CP.  GI: Denies abdominal pain, nausea, vomiting and diarrhea  GU: Denies dysuria and urinary frequency  MSK: Denies myalgia and joint pain  Skin: Denies rash and pruritus.  Neurological: As per HPI  Psychiatric: Denies recent changes in mood. Denies anxiety and depression.    Past Medical History:  Past  Medical History:   Diagnosis Date    Arthritis     Asthma     Back problem     CAD S/P percutaneous coronary angioplasty 2010    Pt reports 5 years ago    Charcot's joint of foot, left 08/06/2014    Colon polyp     CVA (cerebrovascular accident) (CMS HCC) 1995    no residual deficits    Deaf     Diverticulitis of colon     ESBL (extended spectrum beta-lactamase) producing bacteria infection 02/08/2022    ESBL + Klebsiella pneumoniae urine 03/22/22, 02/08/22    Gastroesophageal reflux disease 08/06/2014    GI bleed     H/O hearing loss     History of cardiac pacemaker 08/06/2014    HTN (hypertension)     Hyperlipidemia 12/25/2019    Muscle weakness     Neuropathy (CMS HCC)     Pacemaker     Paroxysmal atrial fibrillation (CMS HCC) 08/06/2014    PUD (peptic ulcer disease) 08/06/2014    C/b GIB Spring 2015    Rheumatoid arthritis (CMS HCC) 08/06/2014    Stage 3 chronic kidney disease (CMS HCC) 12/25/2019    Last Assessment & Plan:  Renal function stable, at baseline Stable electrolytes; restart sodium bicarb 650 mg BID  Continue renal protective ARB Discussed CKD risk factor modifications, including tight BP/glucose control, low protein diet, and avoidance of NSAIDs    Stented  coronary artery     Vitamin D deficiency 12/25/2019    Wears glasses          Past Surgical History:  Past Surgical History:   Procedure Laterality Date    ANKLE SURGERY Left     repair with metal    CATARACT EXTRACTION      unsure which eye but thinks she only had one eye done    CORONARY ARTERY ANGIOPLASTY  2011    ESOPHAGOGASTRODUODENOSCOPY      HIP SURGERY Right     repair with metal    HX BACK SURGERY      lower back x 3 no metal    HX COLONOSCOPY      HX HIP REPLACEMENT Left     HX NISSEN FUNDOPLICATION  2002    then three others to repair problems    HX OPEN CHOLECYSTECTOMY  1962    with common duxt exploration    HX PACEMAKER INSERTION  2019    HX SHOULDER SURGERY Bilateral     scopes    KNEE ARTHROPLASTY Bilateral     ORIF DISTAL FEMUR  FRACTURE Right     OTHER SURGICAL HISTORY Right     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    PANCREATIC PSEUDOCYST DRAINAGE  1971    TOTAL ABDOMINAL HYSTERECTOMY         Past Social Hisory:  Social History     Socioeconomic History    Marital status: Married    Number of children: 2   Tobacco Use    Smoking status: Never    Smokeless tobacco: Never   Vaping Use    Vaping status: Never Used   Substance and Sexual Activity    Alcohol use: Never    Drug use: Never    Sexual activity: Not Currently     Partners: Male   Social History Narrative    Lives at home with husband. 2 grown daughters - an Charity fundraiser and a Mining engineer. She did Building control surveyor for 23 years.      Social Determinants of Health     Financial Resource Strain: Low Risk  (01/24/2023)    Financial Resource Strain     SDOH Financial: No   Transportation Needs: Low Risk  (01/24/2023)    Transportation Needs     SDOH Transportation: No   Social Connections: Medium Risk (02/07/2023)    Social Connections     SDOH Social Isolation: 3 to 5 times a week   Intimate Partner Violence: Low Risk  (01/24/2023)    Intimate Partner Violence     SDOH Domestic Violence: No   Housing Stability: Low Risk  (01/24/2023)    Housing Stability     SDOH Housing Situation: I have housing.     SDOH Housing Worry: No        Past Family History:  Family Medical History:       Problem Relation (Age of Onset)    Brain cancer Mother    Ovarian Cancer Mother    Pancreatic Cancer Father            Allergies:  Allergies   Allergen Reactions    Iodinated Contrast Media Anaphylaxis    Iv Contrast Anaphylaxis     makese her serverly sick    Etodolac NO Steroids unless approved by Attending Physician    Symbicort [Budesonide-Formoterol]     Codeine  Other Adverse Reaction (Add comment) and Nausea/ Vomiting  Stomach pain  Stomach pain          Medications:    amiodarone (CORDARONE) tablet 200 mg Daily    amLODIPine (NORVASC) tablet 10 mg Daily    aspirin chewable tablet 81 mg 81  mg Daily    atorvastatin (LIPITOR) tablet 20 mg QPM    [Held by provider] calcitriol (ROCALTROL) capsule 0.5 mcg M, W, F and SA    cefTRIAXone (ROCEPHIN) 2 g in NS 50 mL IVPB minibag 2 g Q24H    doxycycline hyclate 100 mg in NS 100 mL IVPB minibag 100 mg Q12H    ergocalciferol-vitamin D2 (DRISDOL) capsule 50,000 Units Q FRIDAY    folic acid (FOLVITE) tablet 1 mg Daily    gabapentin (NEURONTIN) capsule 100 mg 2x/day    heparin 5,000 unit/mL injection 5,000 Units Q8HRS    [Held by provider] losartan (COZAAR) tablet 50 mg 2x/day    megestrol (MEGACE) 40 mg per mL oral liquid 800 mg Daily    melatonin tablet 10.5 mg NIGHTLY    methotrexate (TREXALL) tablet 10 mg Q7 Days    metoprolol succinate (TOPROL-XL) 24 hr extended release tablet 100 mg Daily    NS flush syringe 3 mL Q8HRS    pancreatic enzyme replacement (CREON) 6,000 units lipase per cap 1 Capsule 3x/day-Meals    pantoprazole (PROTONIX) delayed release tablet 40 mg Daily    polyethylene glycol (MIRALAX) oral packet 17 g Daily    potassium bicarbonate-citric acid (EFFER-K) effervescent tablet 40 mEq Now    sennosides-docusate sodium (SENOKOT-S) 8.6-50mg  per tablet 1 Tablet 2x/day    sodium bicarbonate tablet 650 mg 2x/day      Vital Signs:  Filed Vitals:    02/13/23 1956 02/14/23 0245 02/14/23 0449 02/14/23 0729   BP: (!) 146/82 139/74 129/74 121/66   Pulse: (!) 106 67 50 91   Resp: 14 16 15 18    Temp: 36.5 C (97.7 F) 37.1 C (98.8 F) 36.6 C (97.9 F) 36.8 C (98.2 F)   SpO2: 98% 100% 98% 98%        Physical exam:   General: No acute distress. Well-nourished.  Eyes: No nystagmus  Lungs: No labored breathing, no wheezing  Cardiovascular: Regular radial pulses felt  Abdomen: Soft, non-tender and non-distended.   Extremities: No edema. Non-tender.  Skin: No rashes or lesions.  Psychiatric: Cooperative. Appropriate mood and affect.    Neurological:  Mental status: Alert, awake, oriented X2? , speech is clear but hypophonic  Cranial nerves:  Blink to threat  present bilaterally, EOMI, PERRLA, unable to close left eye due to local nerve injury, intact facial sensations, face symmetric, intact hearing, intact gag, tongue midline  Motor: Normal tone, decreased muscle mass, 3/5 strength in all extremities.  Sensory:  Inconsistent  Reflexes:  1+  Coordination:  Unable to cooperate  Gait: Deferred due to medical condition.    Assessment and Recommendations:  Charmane Mennen is a 88 y.o., White female with PMH of essential hypertension, hyperlipidemia, paroxysmal atrial fibrillation, rheumatoid arthritis and chronic kidney disease stage 3 presented as a transfer from outside hospital for the evaluation of progressive weakness for the past 1 year.    Labs: I personally reviewed all the lab results.   Hemoglobin 10.6, platelets 313, creatinine 1.86, LDL 75, B12 415    Neuroimaging: I personally reviewed the images and interpretated the results.    CTH 02/13/2023: There is no acute intracranial findings noticed.  No ICH.    1. Failure to thrive-ongoing  Likely from chronic deconditioning and protein caloric malnutrition.  Less likely muscle disease/neuromuscular emergencies.  I would like to initiate her on mirtazapine 15 mg at bedtime for insomnia and decreased appetite.  Otherwise continue treatment per primary    I have discussed my recommendations and plan with the patient daughter who is at bedside.  She verbalized understanding.  All of her questions were answered.      Thanks for the consult call us with questions      Judie Petit, MD  02/14/2023, 07:38     This note was partially generated using MModal Fluency Direct system, and there may be some incorrect words, spellings, and punctuation.

## 2023-02-14 NOTE — Progress Notes (Signed)
Progress Note     Theresa Greene 88 y.o. female  Date of Birth: 29-May-1935  Date of Admit:  02/07/2023   Date of Service: 02/14/2023    Attending: Leonard Downing, MD Code Status:No CPR   PCP: Clydia Llano, DO      Summary:  Theresa Greene is an 88 year old female with a past medical history of paroxysmal atrial fibrillation, hypertension, hyperlipidemia, rheumatoid arthritis, CKD stage IIIB who presented as a transfer from [facility]. The patient initially presented to [facility] for generalized weakness. She stated that over the last two weeks she has had progressive weakness to the point that she required help to go to the restroom. She also admitted to falling in her bathroom on to the tile floor. She denied any injuries from her fall. At baseline she is typically able to ambulate with her walker. She also admitted to decreased oral intake over this same time period. The patient noted that she was diagnosed with COVID-19 in 2020 and has lost about 50lbs since this time due to decreased appetite and attributes that as a cause of her generalized weakness. She denied smoking history and stated that she is up to date on her cancer screenings. She denies any chest pain, shortness of breath, nausea, vomiting, or diarrhea. She was hypertensive on arrival to the ED at 134/72mmHg but was otherwise hemodynamically stable. Labs at the outside facility were significant for creatinine 2.36 (baseline approximately 1.1-1.2) and mildly elevated troponins at 31.2 --> 32.3. CXR showed no acute cardiopulmonary process. At the outside facility she received aspirin 324mg , hydralazine IV 10mg , 1.5L NS bolus, and was started on maintenance fluids of NS 100cc/hr. The patient was subsequently transferred to [facility] as [facility] did not have any beds available. She was admitted to the academic medicine team for further evaluation and management. Patient's renal function is improving with fluid resuscitation. She was noted  to have hypercalcemia on her labs. Monoclonal gammopathy work-up was sent and Calcitriol was held. Patient continues to have elevated corrected calcium levels so calcitonin was ordered. Patient appears to be confused periodically throughout the day, this could be secondary to high calcium levels or prolonged hospital stay. Due to her decline, neurology has been consulted, and CT brain, abdomen and chest have been ordered. Monoclonal gammopathy work-up came back today and showed no M-protein and a normal kappa/lamda ratio.     Interval History:    Patient's weakness has increased. She was having trouble drinking from a straw today and going to the washroom.  CT brain abdomen chest showed no masses.  Neurology recommends adding mirtazapine 15 mg to help increase appetite and improve sleep. Palliative went over goals of care and family and patient want conservative measures. Plan to go into a skilled nursing facility at [facility] if needed.  Serum calcium remains high so calcitonin was given again. Patient remains on antibiotics for pneumonia. Family updated extensively at beside today.    Review of Systems:   Negative unless otherwise stated above.       Objective    Medical History     PMHx:    Past Medical History:   Diagnosis Date    Arthritis     Asthma     Back problem     CAD S/P percutaneous coronary angioplasty 2010    Pt reports 5 years ago    Charcot's joint of foot, left 08/06/2014    Colon polyp     CVA (cerebrovascular accident) (CMS HCC) 1995  no residual deficits    Deaf     Diverticulitis of colon     ESBL (extended spectrum beta-lactamase) producing bacteria infection 02/08/2022    ESBL + Klebsiella pneumoniae urine 03/22/22, 02/08/22    Gastroesophageal reflux disease 08/06/2014    GI bleed     H/O hearing loss     History of cardiac pacemaker 08/06/2014    HTN (hypertension)     Hyperlipidemia 12/25/2019    Muscle weakness     Neuropathy (CMS HCC)     Pacemaker     Paroxysmal atrial fibrillation  (CMS HCC) 08/06/2014    PUD (peptic ulcer disease) 08/06/2014    C/b GIB Spring 2015    Rheumatoid arthritis (CMS HCC) 08/06/2014    Stage 3 chronic kidney disease (CMS HCC) 12/25/2019    Last Assessment & Plan:  Renal function stable, at baseline Stable electrolytes; restart sodium bicarb 650 mg BID  Continue renal protective ARB Discussed CKD risk factor modifications, including tight BP/glucose control, low protein diet, and avoidance of NSAIDs    Stented coronary artery     Vitamin D deficiency 12/25/2019    Wears glasses       Allergies:    Allergies   Allergen Reactions    Iodinated Contrast Media Anaphylaxis    Iv Contrast Anaphylaxis     makese her serverly sick    Etodolac NO Steroids unless approved by Attending Physician    Symbicort [Budesonide-Formoterol]     Codeine  Other Adverse Reaction (Add comment) and Nausea/ Vomiting     Stomach pain  Stomach pain       Social History  Social History     Tobacco Use    Smoking status: Never    Smokeless tobacco: Never   Vaping Use    Vaping status: Never Used   Substance Use Topics    Alcohol use: Never    Drug use: Never     Family History  Family Medical History:       Problem Relation (Age of Onset)    Brain cancer Mother    Ovarian Cancer Mother    Pancreatic Cancer Father           Home Meds:   Dexlansoprazole, acetaminophen, amLODIPine, amiodarone, aspirin, atorvastatin, calcitrioL, cephalexin, ergocalciferol (vitamin D2), folic acid, gabapentin, lipase-protease-amylase, losartan, lysine, melatonin, methotrexate, metoprolol succinate, mirabegron, nitroGLYCERIN, sertraline, and sodium bicarbonate         Objective Findings     Physical Exam:  BP 120/63   Pulse 70   Temp 36.8 C (98.2 F)   Resp 16   Ht 1.6 m (5\' 3" )   Wt 53.1 kg (117 lb)   SpO2 99%   BMI 20.73 kg/m     General: Adult female who appears stated age, no apparent distress, cachetic, chronically ill appearing   HEENT:  Normocephalic/atraumatic  Cardio: RRR, no murmurs heard, no  peripheral edema  Resp: Bilateral crackles   Abd: Soft, NT, ND, Positive BS  Skin: Warm and dry. No lesions or rashes noted.   Neuro: Alert and oriented x3, CN II-XII grossly intact. Decreased muscle strength. No sensory deficits.     Summary of Lab Work and Diagnostic Studies:     CBC   Recent Labs     02/12/23  0629 02/13/23  0609 02/14/23  0541   WBC 10.0 11.5* 7.7   HGB 12.2 11.5 10.6*   HCT 36.2 33.8* 31.4*   PLTCNT 264 336 313   BANDS 2  --   --  Chemistries   Recent Labs     02/12/23  0629 02/13/23  0609 02/13/23  1304 02/14/23  0541   SODIUM 137 143  --  144   POTASSIUM 3.6 3.0* 3.3* 3.8   CHLORIDE 107 106  --  107   CO2 19* 25  --  26   BUN 38* 45*  --  54*   CREATININE 1.62* 1.59*  --  1.86*   CALCIUM 11.0* 10.7*  --  10.7*   ALBUMIN 2.2* 2.1*  --   --    MAGNESIUM 1.9 2.0  --  2.0   PHOSPHORUS 3.4 3.8  --  3.5      Liver Enzymes   Recent Labs     02/12/23  0629 02/13/23  0609   TOTALPROTEIN 5.3* 5.1*   ALBUMIN 2.2* 2.1*   AST 63* 46*   ALT 29* 28*   ALKPHOS 78 77      Inflammatory Markers   Recent Labs     02/13/23  1304   ESR 34*      PROCALCITONIN   Cardiac and Coags   No results for input(s): "UHCEASTTROPI", "BNP", "INR" in the last 72 hours.    Invalid input(s): "PTT", "PT"   Lipid Panel   No results for input(s): "HDL" in the last 72 hours.    Invalid input(s): "LDL", "CHOL"       Results for orders placed or performed during the hospital encounter of 02/07/23 (from the past 24 hour(s))   CT BRAIN WO IV CONTRAST     Status: None    Narrative    Female, 88 years old.    CT BRAIN WO IV CONTRAST performed on 02/13/2023 4:59 PM.    REASON FOR EXAM:  Concern for malignancy    CT Dose:  1100 DLP (mGy*cm)  This CT scanner is equipped with dose reducing technology. The mAs is automatically adjusted to patient's body size in order to deliver the lowest dose possible.      TECHNIQUE: 5 mm axial images were obtained through the brain without intravenous contrast.    RESULTS: There is mild central and  cortical atrophy consistent with the patient's age. Several areas of diminished attenuation are seen in the periventricular white matter bilaterally. These are consistent with chronic microvascular disease.    There is no evidence of acute infarct or intracranial hemorrhage. No extra-axial fluid collections are seen. There is no shift of midline structures. No intracranial masses are appreciated. The sinuses and mastoids are clear.      Impression    1. Mild central and cortical atrophy with associated chronic microvascular changes in the periventricular white matter. No significant change since the 07/03/2022 exam.  2. No evidence of acute intracranial pathology. Study is limited for evaluation of potential subtle neoplasm due to the lack of intravenous contrast.      Radiologist location ID: [id number]     CT CHEST WO IV CONTRAST     Status: None    Narrative    Female, 88 years old.    CT CHEST WO IV CONTRAST performed on 02/13/2023 5:01 PM.    REASON FOR EXAM:  Concern for malignancy    CT Dose:  300 DLP (mGy*cm)  This CT scanner is equipped with dose reducing technology. The mAs is automatically adjusted to patient's body size in order to deliver the lowest dose possible.    FINDINGS: Axial unenhanced CT imaging of the chest was performed along with sagittal and coronal  reformats. Comparison made prior study from 2021. Today's study is abnormal. Multiple findings will be detailed below.      Impression    1. Interval development of small bilateral pleural effusions and associated dense posterior bibasilar atelectasis. These findings are slightly greater on the right.  2. Patient also has developed groundglass interstitial densities suggestive of areas of infiltrate or pulmonary edema.  3. There are some linear changes noted extending anteriorly in each lung base most consistent with atelectasis.  4. Mild cardiomegaly without pericardial effusion.  5. Diffuse ASCVD.  6. Right-sided pacemaker is unchanged in  position.  7. No pneumothorax.  8. Bony structures are diffusely osteopenic and show degenerative change but otherwise intact.  9. No definite mass or suspicious adenopathy is seen but study is limited due to the acute processes and lack of intravenous contrast.      Radiologist location ID: [id number]     CT ABDOMEN PELVIS WO IV CONTRAST     Status: None    Narrative    Female, 88 years old.    CT ABDOMEN PELVIS WO IV CONTRAST performed on 02/13/2023 5:02 PM.    REASON FOR EXAM:  Concern for malignancy    CT Dose:  440 DLP (mGy*cm)  This CT scanner is equipped with dose reducing technology. The mAs is automatically adjusted to patient's body size in order to deliver the lowest dose possible.    FINDINGS: Axial unenhanced CT imaging of the abdomen and pelvis was performed along with sagittal and coronal reformats. Comparison made to a prior study dated 09/25/2021. The included lung bases show small pleural effusions and dense atelectatic change.    The liver is stable in size and configuration. No focal hepatic lesion or ductal dilatation is seen. Gallbladder is not identified. The pancreas is atrophic. The spleen is not identified. The adrenal glands and kidneys show no acute process.    The small and large bowel show no obstructive process. There is diverticulosis without diverticulitis. A moderate amount of stool was noted throughout colon. There is no definite focal bowel wall thickening or surrounding inflammation. Urinary bladder is distended symmetrically. No free fluid or air is seen.    There is a mild degree of third spacing of fluid with hazy stranding in the subcutaneous and mesenteric fat. There are diffuse arterial vascular calcifications without aneurysm. No discrete mass lesion or adenopathy is seen but study is limited by the lack of intravenous and oral contrast along with lack of intraperitoneal fat.    Bony structures are diffusely osteopenic. Scoliosis is noted within the visualized spine. Pelvic  ring is intact. There are postsurgical changes involving each femur. No acute bony process is detected.      Impression    1. Limited study by the lack of intravenous and oral contrast. However, no discrete evidence to suggest mass or adenopathy is seen.  2. Third spacing of fluid is noted.  3. Diffuse ASCVD without aneurysm.  4. Diverticulosis without diverticulitis.  5. Osteopenia and chronic bony changes are noted along with postoperative change involving each hip/femur.      Radiologist location ID: [id number]                Assessment & Plan     Active Hospital Problems    Diagnosis    Primary Problem: Elevated troponin    FTT (failure to thrive) in adult    Generalized weakness    Acute kidney injury (CMS HCC)    Stage 3b  chronic kidney disease (CMS HCC)    Hyperlipidemia    HTN (hypertension)    Rheumatoid arthritis (CMS HCC)    PUD (peptic ulcer disease)    Paroxysmal atrial fibrillation (CMS HCC)       Generalized Weakness / Fall/ Failure to Thrive  -Patient presented to outside facility due to progressive generalized weakness and fall  -Likely secondary to chronic deconditioning, malnutrition, and poor PO intake   -PT/OT/Speech therapy ordered  -Fall precautions in place   - Neurology recommends mirtazapine to increased appetite and sleep.  Less likely muscle disease/neuromuscular emergency.  - Started Remeron 7.5 mg for GFR <30.    -  On CT brain abdomen pelvis chest w/o IV contrast, no discrete evidence to suggest mass or adenopathy.    - CK was 45, normal.  - ESR was high, 34.    Community Acquired Pneumonia (Suspected Bacterial, Unknown Organism)   -Patient spiked fever of 37.8 overnight on 02/10/23  -Procalcitonin 0.43, normal  -Respiratory viral panel was negative   -Streptococcus pneumonia antigen, legionella antigen, and MRSA screen negative  - UA screen negative  - Chest xray showed bilateral multifocal pneumonia  - Continue Rocephin (2 mg IV once daily) and Doxycyline (100 mg IV twice daily)  -  CT chest shows ground-glass interstitial densities suggestive of infiltrate or pulmonary edema.  We will continue patient on antibiotic regimen.    Acute Metabolic Encephalopathy  - Likely secondary to elevated corrected calcium levels, or prolonged hospital stay or infection  - UA was not suggestive of infection  - Chest xray revealed pneumonia, patient started on antibiotics.   - Miacalcin 200 units SC x3  - Strict delirium precautions in place.    Hypercalcemia  -Potential etiologies include mediation vs malignancy (Calcitrol vs AKI)  - Can not rule out malignancy  -Corrected calcium 12.2   -PTH low at 7.1  - PTHrP workup in progress  - Given Miacalcin 200 units SC x3  -Monoclonal gammopathy work-up showed no M-protein and normal kappa/lamda ratio  -Fluids discontinued due to oxygen requirement   -Lasix 40mg  IV x1 dose   -Home medication- Calcitriol 0.42mcg three times weekly- HELD      Acute Kidney Injury on Chronic Kidney Disease Stage IIIB- Improving   -prerenal likely secondary to poor PO intake  -Creatinine 2.36 on presentation to outside facility (baseline approximately 1.1-1.2)  - Cr was 1.86 today  - mild elevation in creatinine level today most likely secondary to poor oral intake  -US kidney showed no evidence of obstruction   -IV fluids discontinued due to oxygen requirement   -Avoid nephrotoxic agents     Acute Hypoxic Respiratory Failure    -Likely secondary to pneumonia   -Patient requiring 5L NC (does not require supplemental oxygen at baseline)  -CXR suggestive of multifocal pneumonia   - Continue Rocephin and Doxycyline.     Elevated Troponin  -Likely secondary to acute kidney injury   -Patient reports no chest pain   -Troponins peaked at 42.4   -EKG showed an atrial paced rhythm with a non-specific T wave abnormality   -Continuous telemetry      Paroxysmal Atrial Fibrillation   -Currently rate controlled  -Continue Amiodarone 200mg  daily and Toprol XL 100mg  daily   -Continuous telemetry       Protein Calorie Malnutrition   Patient meets criteria based on the following clinical characteristics:   Malnutrition (Undernutrition) Diagnosis  Malnutrition Chronicity: Chronic illness  Malnutrition Severity: Severe protein calorie malnutrition  Related to: Chronic illness  Energy Intake: Less than 75% of EER in greater than or equal to 1 month  Body Fat Loss: Severe  Muscle Mass Loss: Severe  Nutrition consulted  Nutrition Treatment Plan: Initiate oral supplement, Initiate vitamin/mineral supplement(s), Monitor weight trends  MNT protocol ordered  Monitor bowel functions  Continue Megace 800mg  daily   Lab Results   Component Value Date    ALBUMIN 2.1 (L) 02/13/2023     Hypertension   -Remains mildly hypertensive   -Continue Toprol XL 100mg  daily and Amlodipine increased to 10mg  daily   -Home medication- Losartan 50mg  BID - HELD for AKI     Hyperlipidemia   -Continue Lipitor 20mg  daily     Rheumatoid Arthritis   -Continue Methotrexate 10mg  weekly      Peptic Ulcer Disease   -Continue Protonix 40mg  daily       Nutrition: DIETARY ORAL SUPPLEMENTS Product Name: Magic Cup - Vanilla; Frequency: LUNCH/DINNER; Number of Containers: 1 Each  DIET CARDIAC Consistency/thickening: PUREE; Instructional: POSITION AS UPRIGHT AS POSSIBLE (BETWEEN 45-90 DEGREES) UNLESS CONTRAINDICATED   DVT PPx: Heparin 5,000U SQ TID  Code Status: No CPR      Mila Homer Zareen Jamison, MD, PGY-I  Case discussed with attending physician.     I have independently seen and examined the patient on the date of service.  I have discussed the history, presentation, and plan of care in detail with the resident..  I have reviewed the document above for medical content and agree as documented.  Not all grammatical errors, misspellings, transcription misinterpretations, and punctuation errors have been corrected.     Exam:  Cardiovascular: Agree as documented  Respiratory: Agree as documented  Leonard Downing MD  This note may have been partially generated using  MModal Fluency Direct system, and there may be some incorrect words, spellings, and punctuation that were not noted in checking the note before saving.     End of Note

## 2023-02-14 NOTE — Care Plan (Signed)
Problem: Malnutrition  Goal: Improved Nutritional Intake  Outcome: Ongoing (see interventions/notes)     Problem: Skin Injury Risk Increased  Goal: Skin Health and Integrity  Outcome: Ongoing (see interventions/notes)  Intervention: Optimize Skin Protection  Recent Flowsheet Documentation  Taken 02/13/2023 2100 by Arva Chafe, LPN  Pressure Reduction Techniques: Frequent weight shifting encouraged  Pressure Reduction Devices: Repositioning wedges/pillows utilized  Skin Protection: adhesive use limited  Head of Bed (HOB) Positioning: HOB at 20 degrees     Patient resting. Family at patient bedside. She is on a puree diet. Pills crushed in applesauce or pudding. Per wick in place.  She denies any needs.

## 2023-02-15 ENCOUNTER — Other Ambulatory Visit: Payer: Self-pay

## 2023-02-15 DIAGNOSIS — E876 Hypokalemia: Secondary | ICD-10-CM

## 2023-02-15 LAB — CBC WITH DIFF
BASOPHIL #: <0.1 10*3/uL (ref <=0.20)
BASOPHIL %: 0.3 %
EOSINOPHIL #: 0.12 10*3/uL (ref <=0.50)
EOSINOPHIL %: 3.2 %
HCT: 30.2 % — ABNORMAL LOW (ref 34.8–46.0)
HGB: 9.6 g/dL — ABNORMAL LOW (ref 11.5–16.0)
IMMATURE GRANULOCYTE #: 0.12 10*3/uL — ABNORMAL HIGH (ref <0.10)
IMMATURE GRANULOCYTE %: 3.2 % — ABNORMAL HIGH (ref 0.0–1.0)
LYMPHOCYTE #: 0.53 10*3/uL — ABNORMAL LOW (ref 1.00–4.80)
LYMPHOCYTE %: 14 %
MCH: 29.7 pg (ref 26.0–32.0)
MCHC: 31.8 g/dL (ref 31.0–35.5)
MCV: 93.5 fL (ref 78.0–100.0)
MONOCYTE #: <0.1 10*3/uL — ABNORMAL LOW (ref 0.20–1.10)
MONOCYTE %: 1.8 %
MPV: 11 fL (ref 8.7–12.5)
NEUTROPHIL #: 2.94 10*3/uL (ref 1.50–7.70)
NEUTROPHIL %: 77.5 %
PLATELETS: 274 10*3/uL (ref 150–400)
RBC: 3.23 10*6/uL — ABNORMAL LOW (ref 3.85–5.22)
RDW-CV: 17.7 % — ABNORMAL HIGH (ref 11.5–15.5)
WBC: 3.8 10*3/uL (ref 3.7–11.0)

## 2023-02-15 LAB — COMPREHENSIVE METABOLIC PANEL, NON-FASTING
ALBUMIN: 2 g/dL — ABNORMAL LOW (ref 3.4–4.8)
ALKALINE PHOSPHATASE: 60 U/L (ref 55–145)
ALT (SGPT): 18 U/L (ref 8–22)
ANION GAP: 12 mmol/L (ref 4–13)
AST (SGOT): 22 U/L (ref 8–45)
BILIRUBIN TOTAL: 0.3 mg/dL (ref 0.3–1.3)
BUN/CREA RATIO: 32 — ABNORMAL HIGH (ref 6–22)
BUN: 55 mg/dL — ABNORMAL HIGH (ref 8–25)
CALCIUM: 10.4 mg/dL — ABNORMAL HIGH (ref 8.6–10.3)
CHLORIDE: 108 mmol/L (ref 96–111)
CO2 TOTAL: 25 mmol/L (ref 23–31)
CREATININE: 1.72 mg/dL — ABNORMAL HIGH (ref 0.60–1.05)
ESTIMATED GFR - FEMALE: 29 mL/min/BSA — ABNORMAL LOW (ref >=60)
GLUCOSE: 94 mg/dL (ref 65–125)
POTASSIUM: 3.3 mmol/L — ABNORMAL LOW (ref 3.5–5.1)
PROTEIN TOTAL: 5 g/dL — ABNORMAL LOW (ref 6.0–8.0)
SODIUM: 145 mmol/L (ref 136–145)

## 2023-02-15 LAB — MAGNESIUM: MAGNESIUM: 2 mg/dL (ref 1.8–2.6)

## 2023-02-15 LAB — PHOSPHORUS: PHOSPHORUS: 3 mg/dL (ref 2.3–4.0)

## 2023-02-15 MED ORDER — POTASSIUM BICARBONATE-CITRIC ACID 20 MEQ EFFERVESCENT TABLET
40.0000 meq | EFFERVESCENT_TABLET | ORAL | Status: AC
Start: 2023-02-15 — End: 2023-02-15
  Administered 2023-02-15: 40 meq via ORAL
  Filled 2023-02-15: qty 2

## 2023-02-15 NOTE — Nurses Notes (Signed)
Bedside report completed.  Patient is awake, alert and oriented, but confused at times.  Respirations are even and and unlabored.  Patient is on O2 at 1L.  Continuous pulse ox noted with sats in the high 90's.  Telemetry monitor is on.  IVL noted to RFA.  Patient encouraged to use call light for any assistance turning or ambulating, pain management, or any questions about patient's care.  Call light within easy reach.  Bed alarms are on and audible.  Safety maintained.

## 2023-02-15 NOTE — Care Plan (Signed)
Shift Review:       Patient remained stable and rested well overnight.       Patient is AAOx4.       On 4L NC.  Clear but diminished Bil BS noted.  No SOB noted or reported per patient.      Paced rhythm noted per monitor.  VSS.      Active bowel sounds present.   Tolerating diet well.       UOP per external catheter= .  No BM overnight.       Patient able to move all extremities; however, generalized weakness w/ moderate impairment noted.       Care provided per MD orders, safety rounds performed per protocol, and safety precautions remain in place.    Problem: Adult Inpatient Plan of Care  Goal: Plan of Care Review  Outcome: Ongoing (see interventions/notes)  Goal: Patient-Specific Goal (Individualized)  Outcome: Ongoing (see interventions/notes)  Flowsheets (Taken 02/14/2023 2000)  Patient would like to participate in bedside shift report: Yes  Individualized Care Needs:   Update & educate on POC   Monitor & reassess neuro status, respiratory status, VS, I&Os, & LDAWs   Manage & maintain LDAWs per MD order   Maintain CDI skin & dsgs   & Promote rest.  Anxieties, Fears or Concerns: None at this time.  Patient-Specific Goals (Include Timeframe):   Remain stable w/ neuro status, respiratory status, VS, I&Os, & LDAWs WDL   Maintain LDAWs per MD order   Maintain CDI skin & dsgs   & Receive adequate rest overnight.  Plan of Care Reviewed With: patient  Goal: Absence of Hospital-Acquired Illness or Injury  Outcome: Ongoing (see interventions/notes)  Intervention: Identify and Manage Fall Risk  Recent Flowsheet Documentation  Taken 02/15/2023 0600 by Dierdre Searles, RN  Safety Promotion/Fall Prevention:   safety round/check completed   fall prevention program maintained   activity supervised   nonskid shoes/slippers when out of bed  Taken 02/15/2023 0500 by Dierdre Searles, RN  Safety Promotion/Fall Prevention:   safety round/check completed   fall prevention program maintained   activity supervised   nonskid  shoes/slippers when out of bed  Taken 02/15/2023 0400 by Dierdre Searles, RN  Safety Promotion/Fall Prevention:   safety round/check completed   fall prevention program maintained   activity supervised   nonskid shoes/slippers when out of bed  Taken 02/15/2023 0300 by Dierdre Searles, RN  Safety Promotion/Fall Prevention:   safety round/check completed   fall prevention program maintained   activity supervised   nonskid shoes/slippers when out of bed  Taken 02/15/2023 0200 by Dierdre Searles, RN  Safety Promotion/Fall Prevention:   safety round/check completed   fall prevention program maintained   activity supervised   nonskid shoes/slippers when out of bed  Taken 02/15/2023 0100 by Dierdre Searles, RN  Safety Promotion/Fall Prevention:   safety round/check completed   fall prevention program maintained   activity supervised   nonskid shoes/slippers when out of bed  Taken 02/15/2023 0000 by Dierdre Searles, RN  Safety Promotion/Fall Prevention:   safety round/check completed   fall prevention program maintained   activity supervised   nonskid shoes/slippers when out of bed  Taken 02/14/2023 2300 by Dierdre Searles, RN  Safety Promotion/Fall Prevention:   safety round/check completed   fall prevention program maintained   activity supervised   nonskid shoes/slippers when out of bed  Taken 02/14/2023 2200 by Dierdre Searles, RN  Safety Promotion/Fall Prevention:   safety round/check completed  fall prevention program maintained   activity supervised   nonskid shoes/slippers when out of bed  Taken 02/14/2023 2100 by Dierdre Searles, RN  Safety Promotion/Fall Prevention:   safety round/check completed   fall prevention program maintained   activity supervised   nonskid shoes/slippers when out of bed  Taken 02/14/2023 2000 by Dierdre Searles, RN  Safety Promotion/Fall Prevention:   safety round/check completed   fall prevention program maintained   activity supervised   nonskid shoes/slippers when out of bed  Taken 02/14/2023  1909 by Dierdre Searles, RN  Safety Promotion/Fall Prevention:   safety round/check completed   fall prevention program maintained   activity supervised   nonskid shoes/slippers when out of bed  Intervention: Prevent Skin Injury  Recent Flowsheet Documentation  Taken 02/15/2023 0600 by Dierdre Searles, RN  Body Position:   supine, head elevated   neutral body alignment   neutral head position   neutral head position, midline maintained   heels elevated off mattress  Taken 02/15/2023 0400 by Dierdre Searles, RN  Body Position:   side lying, right   supine, head elevated   neutral body alignment   neutral head position   neutral head position, midline maintained   heels elevated off mattress  Taken 02/15/2023 0200 by Dierdre Searles, RN  Body Position:   side lying, left   supine, head elevated   neutral body alignment   neutral head position   neutral head position, midline maintained   heels elevated off mattress  Taken 02/15/2023 0000 by Dierdre Searles, RN  Body Position:   side lying, right   supine, head elevated   neutral body alignment   neutral head position   neutral head position, midline maintained   heels elevated off mattress  Taken 02/14/2023 2200 by Dierdre Searles, RN  Body Position:   side lying, left   supine, head elevated   neutral body alignment   neutral head position   neutral head position, midline maintained   heels elevated off mattress  Taken 02/14/2023 2000 by Dierdre Searles, RN  Body Position:   supine, head elevated   neutral body alignment   neutral head position   neutral head position, midline maintained   heels elevated off mattress  Skin Protection:   adhesive use limited   electrode sites changed   incontinence pads utilized   skin-to-device areas padded   transparent dressing maintained   tubing/devices free from skin contact  Intervention: Prevent and Manage VTE (Venous Thromboembolism) Risk  Recent Flowsheet Documentation  Taken 02/14/2023 2000 by Dierdre Searles, RN  VTE  Prevention/Management: anticoagulant therapy maintained  Intervention: Prevent Infection  Recent Flowsheet Documentation  Taken 02/15/2023 0600 by Dierdre Searles, RN  Infection Prevention:   barrier precautions utilized   environmental surveillance performed   equipment surfaces disinfected   glycemic control managed   personal protective equipment utilized   promote handwashing   rest/sleep promoted   single patient room provided  Taken 02/15/2023 0400 by Dierdre Searles, RN  Infection Prevention:   barrier precautions utilized   environmental surveillance performed   equipment surfaces disinfected   glycemic control managed   personal protective equipment utilized   promote handwashing   rest/sleep promoted   single patient room provided  Taken 02/15/2023 0200 by Dierdre Searles, RN  Infection Prevention:   barrier precautions utilized   environmental surveillance performed   equipment surfaces disinfected   glycemic control managed   personal protective equipment utilized   promote handwashing  rest/sleep promoted   single patient room provided  Taken 02/15/2023 0000 by Dierdre Searles, RN  Infection Prevention:   barrier precautions utilized   environmental surveillance performed   equipment surfaces disinfected   glycemic control managed   personal protective equipment utilized   promote handwashing   rest/sleep promoted   single patient room provided  Taken 02/14/2023 2200 by Dierdre Searles, RN  Infection Prevention:   barrier precautions utilized   environmental surveillance performed   equipment surfaces disinfected   glycemic control managed   personal protective equipment utilized   promote handwashing   rest/sleep promoted   single patient room provided  Taken 02/14/2023 2000 by Dierdre Searles, RN  Infection Prevention:   barrier precautions utilized   environmental surveillance performed   equipment surfaces disinfected   glycemic control managed   personal protective equipment utilized   promote  handwashing   rest/sleep promoted   single patient room provided  Goal: Optimal Comfort and Wellbeing  Outcome: Ongoing (see interventions/notes)  Intervention: Provide Person-Centered Care  Recent Flowsheet Documentation  Taken 02/14/2023 2000 by Dierdre Searles, RN  Trust Relationship/Rapport:   care explained   choices provided   emotional support provided   empathic listening provided   questions answered   questions encouraged   reassurance provided   thoughts/feelings acknowledged  Goal: Rounds/Family Conference  Outcome: Ongoing (see interventions/notes)     Problem: Health Knowledge, Opportunity to Enhance (Adult,Obstetrics,Pediatric)  Goal: Knowledgeable about Health Subject/Topic  Description: Patient will demonstrate the desired outcomes by discharge/transition of care.  Outcome: Ongoing (see interventions/notes)  Intervention: Enhance Health Knowledge  Recent Flowsheet Documentation  Taken 02/14/2023 2000 by Dierdre Searles, RN  Family/Support System Care:   self-care encouraged   support provided  Supportive Measures:   active listening utilized   goal-setting facilitated   relaxation techniques promoted   self-care encouraged   verbalization of feelings encouraged  Intervention: Enhance Health Knowledge  Recent Flowsheet Documentation  Taken 02/14/2023 2000 by Dierdre Searles, RN  Family/Support System Care:   self-care encouraged   support provided  Supportive Measures:   active listening utilized   goal-setting facilitated   relaxation techniques promoted   self-care encouraged   verbalization of feelings encouraged     Problem: Skin Injury Risk Increased  Goal: Skin Health and Integrity  Outcome: Ongoing (see interventions/notes)  Intervention: Optimize Skin Protection  Recent Flowsheet Documentation  Taken 02/15/2023 0600 by Dierdre Searles, RN  Head of Bed [facility]) Positioning: HOB at 20-30 degrees  Taken 02/15/2023 0400 by Dierdre Searles, RN  Head of Bed [facility]) Positioning: HOB at 20-30 degrees  Taken  02/15/2023 0200 by Dierdre Searles, RN  Head of Bed [facility]) Positioning: HOB at 20-30 degrees  Taken 02/15/2023 0000 by Dierdre Searles, RN  Head of Bed [facility]) Positioning: HOB at 20-30 degrees  Taken 02/14/2023 2200 by Dierdre Searles, RN  Head of Bed [facility]) Positioning: HOB at 30-45 degrees  Taken 02/14/2023 2000 by Dierdre Searles, RN  Pressure Reduction Techniques:   Frequent weight shifting encouraged   Pressure points protected   Patient turned q 2 hours   Heels elevated off of the bed  Pressure Reduction Devices: Repositioning wedges/pillows utilized  Skin Protection:   adhesive use limited   electrode sites changed   incontinence pads utilized   skin-to-device areas padded   transparent dressing maintained   tubing/devices free from skin contact  Activity Management: ROM, active encouraged  Head of Bed (HOB) Positioning: HOB at 30-45 degrees  Problem: Oral Intake Inadequate  Goal: Improved Oral Intake  Outcome: Ongoing (see interventions/notes)     Problem: Malnutrition  Goal: Improved Nutritional Intake  Outcome: Ongoing (see interventions/notes)

## 2023-02-15 NOTE — Progress Notes (Signed)
Progress Note     Theresa Greene 88 y.o. female  Date of Birth: 1935-08-10  Date of Admit:  02/07/2023   Date of Service: 02/15/2023    Attending: Leonard Downing, MD Code Status:No CPR   PCP: [facility], DO      Summary:  Theresa Greene "Cindy Montross is an 88 year old female with a past medical history of paroxysmal atrial fibrillation, hypertension, hyperlipidemia, rheumatoid arthritis, CKD stage IIIB who presented as a transfer from [facility]. The patient initially presented to [facility] for generalized weakness. She stated that over the last two weeks she has had progressive weakness to the point that she required help to go to the restroom. She also admitted to falling in her bathroom on to the tile floor. She denied any injuries from her fall. At baseline she is typically able to ambulate with her walker. She also admitted to decreased oral intake over this same time period. The patient noted that she was diagnosed with COVID-19 in 2020 and has lost about 50lbs since this time due to decreased appetite and attributes that as a cause of her generalized weakness. She denied smoking history and stated that she is up to date on her cancer screenings. She denies any chest pain, shortness of breath, nausea, vomiting, or diarrhea. She was hypertensive on arrival to the ED at 134/33mmHg but was otherwise hemodynamically stable. Labs at the outside facility were significant for creatinine 2.36 (baseline approximately 1.1-1.2) and mildly elevated troponins at 31.2 --> 32.3. CXR showed no acute cardiopulmonary process. At the outside facility she received aspirin 324mg , hydralazine IV 10mg , 1.5L NS bolus, and was started on maintenance fluids of NS 100cc/hr. The patient was subsequently transferred to [facility] as [facility] did not have any beds available. She was admitted to the academic medicine team for further evaluation and management. Patient's renal function is improving with fluid resuscitation. She was noted  to have hypercalcemia on her labs. Monoclonal gammopathy work-up was sent and Calcitriol was held. Patient continues to have elevated corrected calcium levels so calcitonin was ordered. Patient appears to be confused periodically throughout the day, this could be secondary to high calcium levels or prolonged hospital stay. Due to her decline, neurology has been consulted, and CT brain, abdomen and chest have been ordered. Monoclonal gammopathy work-up came back today and showed no M-protein and a normal kappa/lamda ratio. CT brain abdomen chest showed no masses. Neurology recommends adding mirtazapine 15 mg to help increase appetite and improve sleep. Palliative went over goals of care and family and patient want conservative measures. Plan to go into a skilled nursing facility at [facility] if needed.     Interval History:    Patient continues to be weak.  Patient is still requiring 4 L NC, will titrate down to maintain saturation >90%.  Antibiotic course completed today.  Review of Systems:   Negative unless otherwise stated above.       Objective    Medical History     PMHx:    Past Medical History:   Diagnosis Date    Arthritis     Asthma     Back problem     CAD S/P percutaneous coronary angioplasty 2010    Pt reports 5 years ago    Charcot's joint of foot, left 08/06/2014    Colon polyp     CVA (cerebrovascular accident) (CMS HCC) 1995    no residual deficits    Deaf     Diverticulitis of colon  ESBL (extended spectrum beta-lactamase) producing bacteria infection 02/08/2022    ESBL + Klebsiella pneumoniae urine 03/22/22, 02/08/22    Gastroesophageal reflux disease 08/06/2014    GI bleed     H/O hearing loss     History of cardiac pacemaker 08/06/2014    HTN (hypertension)     Hyperlipidemia 12/25/2019    Muscle weakness     Neuropathy (CMS HCC)     Pacemaker     Paroxysmal atrial fibrillation (CMS HCC) 08/06/2014    PUD (peptic ulcer disease) 08/06/2014    C/b GIB Spring 2015    Rheumatoid arthritis (CMS HCC)  08/06/2014    Stage 3 chronic kidney disease (CMS HCC) 12/25/2019    Last Assessment & Plan:  Renal function stable, at baseline Stable electrolytes; restart sodium bicarb 650 mg BID  Continue renal protective ARB Discussed CKD risk factor modifications, including tight BP/glucose control, low protein diet, and avoidance of NSAIDs    Stented coronary artery     Vitamin D deficiency 12/25/2019    Wears glasses       Allergies:    Allergies   Allergen Reactions    Iodinated Contrast Media Anaphylaxis    Iv Contrast Anaphylaxis     makese her serverly sick    Etodolac NO Steroids unless approved by Attending Physician    Symbicort [Budesonide-Formoterol]     Codeine  Other Adverse Reaction (Add comment) and Nausea/ Vomiting     Stomach pain  Stomach pain       Social History  Social History     Tobacco Use    Smoking status: Never    Smokeless tobacco: Never   Vaping Use    Vaping status: Never Used   Substance Use Topics    Alcohol use: Never    Drug use: Never     Family History  Family Medical History:       Problem Relation (Age of Onset)    Brain cancer Mother    Ovarian Cancer Mother    Pancreatic Cancer Father           Home Meds:   Dexlansoprazole, acetaminophen, amLODIPine, amiodarone, aspirin, atorvastatin, calcitrioL, cephalexin, ergocalciferol (vitamin D2), folic acid, gabapentin, lipase-protease-amylase, losartan, lysine, melatonin, methotrexate, metoprolol succinate, mirabegron, nitroGLYCERIN, sertraline, and sodium bicarbonate         Objective Findings     Physical Exam:  BP (!) 146/72   Pulse 85   Temp 36.8 C (98.2 F)   Resp 16   Ht 1.6 m (5\' 3" )   Wt 53.1 kg (117 lb)   SpO2 100%   BMI 20.73 kg/m     General: Adult female who appears stated age, no apparent distress, cachetic, chronically ill appearing   HEENT:  Normocephalic/atraumatic  Cardio: RRR, no murmurs heard, no peripheral edema  Resp: Bilateral crackles   Abd: Soft, NT, ND, Positive BS  Skin: Warm and dry. No lesions or rashes  noted.   Neuro: Alert and oriented x3, CN II-XII grossly intact. Decreased muscle strength. No sensory deficits.     Summary of Lab Work and Diagnostic Studies:     CBC   Recent Labs     02/13/23  0609 02/14/23  0541 02/15/23  0415   WBC 11.5* 7.7 3.8   HGB 11.5 10.6* 9.6*   HCT 33.8* 31.4* 30.2*   PLTCNT 336 313 274      Chemistries   Recent Labs     02/13/23  0609 02/13/23  1304 02/14/23  0541 02/15/23  0415   SODIUM 143  --  144 145   POTASSIUM 3.0* 3.3* 3.8 3.3*   CHLORIDE 106  --  107 108   CO2 25  --  26 25   BUN 45*  --  54* 55*   CREATININE 1.59*  --  1.86* 1.72*   CALCIUM 10.7*  --  10.7* 10.4*   ALBUMIN 2.1*  --   --  2.0*   MAGNESIUM 2.0  --  2.0 2.0   PHOSPHORUS 3.8  --  3.5 3.0      Liver Enzymes   Recent Labs     02/13/23  0609 02/15/23  0415   TOTALPROTEIN 5.1* 5.0*   ALBUMIN 2.1* 2.0*   AST 46* 22   ALT 28* 18   ALKPHOS 77 60      Inflammatory Markers   Recent Labs     02/13/23  1304   ESR 34*      PROCALCITONIN   Cardiac and Coags   No results for input(s): "UHCEASTTROPI", "BNP", "INR" in the last 72 hours.    Invalid input(s): "PTT", "PT"   Lipid Panel   No results for input(s): "HDL" in the last 72 hours.    Invalid input(s): "LDL", "CHOL"                     Assessment & Plan     Active Hospital Problems    Diagnosis    Primary Problem: Elevated troponin    FTT (failure to thrive) in adult    Generalized weakness    Acute kidney injury (CMS HCC)    Stage 3b chronic kidney disease (CMS HCC)    Hyperlipidemia    HTN (hypertension)    Rheumatoid arthritis (CMS HCC)    PUD (peptic ulcer disease)    Paroxysmal atrial fibrillation (CMS HCC)       Generalized Weakness / Fall/ Failure to Thrive  -Patient presented to outside facility due to progressive generalized weakness and fall  -Likely secondary to chronic deconditioning, malnutrition, and poor PO intake   -PT/OT/Speech therapy ordered  -Fall precautions in place   - Neurology recommends mirtazapine to increased appetite and sleep.  Less likely  muscle disease/neuromuscular emergency.  - Continue Remeron 7.5 mg for GFR <30.    -  CT brain/chest/abdomen/pelvis showed no discrete evidence to suggest mass or adenopathy.    - CK was 45, normal.  - ESR was high, 34.    Hypokalemia  - Potassium was 3.3.  - Replaced with EFFER-K 40 mEq. Will continue to monitor labs and replace as needed    Community Acquired Pneumonia (Suspected Bacterial, Unknown Organism)   -Patient spiked fever of 37.8 overnight on 02/10/23  -Procalcitonin 0.43, normal  -Respiratory viral panel was negative   -Streptococcus pneumonia antigen, legionella antigen, and MRSA screen negative  - UA screen negative  - Chest xray showed bilateral multifocal pneumonia  - Day 5/5 of Rocephin (2 mg IV once daily) and Doxycyline (100 mg IV twice daily)  - CT chest shows ground-glass interstitial densities suggestive of infiltrate or pulmonary edema.      Acute Metabolic Encephalopathy  - Likely secondary to elevated corrected calcium levels, or prolonged hospital stay or infection  - UA was not suggestive of infection  - Chest xray revealed pneumonia, patient started on antibiotics.   - Status post Miacalcin 200 units SC x3  - Strict delirium precautions in place.    Hypercalcemia  -Potential  etiologies include mediation vs malignancy (Calcitrol vs AKI)  - Can not rule out malignancy  -Corrected calcium 12.0   -PTH low at 7.1  - PTHrP workup in progress  - Status post Miacalcin 200 units SC x3  -Monoclonal gammopathy work-up showed no M-protein and normal kappa/lamda ratio  -Home medication- Calcitriol 0.65mcg three times weekly- HELD      Acute Kidney Injury on Chronic Kidney Disease Stage IIIB- Improving   -prerenal likely secondary to poor PO intake  -Creatinine 2.36 on presentation to outside facility (baseline approximately 1.1-1.2)  - Cr was 1.72 today.  It is improving.  -US kidney showed no evidence of obstruction   -IV fluids discontinued due to oxygen requirement   -Avoid nephrotoxic agents      Acute Hypoxic Respiratory Failure    -Likely secondary to pneumonia   -Patient requiring 4L NC (does not require supplemental oxygen at baseline)  -CXR suggestive of multifocal pneumonia   - titrate oxygen to maintain sat > 90%    Elevated Troponin  -Likely secondary to acute kidney injury   -Patient reports no chest pain   -Troponins peaked at 42.4   -EKG showed an atrial paced rhythm with a non-specific T wave abnormality   -Continuous telemetry      Paroxysmal Atrial Fibrillation   -Currently rate controlled  -Continue Amiodarone 200mg  daily and Toprol XL 100mg  daily   -Continuous telemetry      Protein Calorie Malnutrition   Patient meets criteria based on the following clinical characteristics:   Malnutrition (Undernutrition) Diagnosis  Malnutrition Chronicity: Chronic illness  Malnutrition Severity: Severe protein calorie malnutrition  Related to: Chronic illness  Energy Intake: Less than 75% of EER in greater than or equal to 1 month  Body Fat Loss: Severe  Muscle Mass Loss: Severe  Nutrition consulted  Nutrition Treatment Plan: Initiate oral supplement, Initiate vitamin/mineral supplement(s), Monitor weight trends  MNT protocol ordered  Monitor bowel functions  Continue Megace 800mg  daily   Lab Results   Component Value Date    ALBUMIN 2.0 (L) 02/15/2023     Hypertension   -Remains mildly hypertensive   -Continue Toprol XL 100mg  daily and Amlodipine increased to 10mg  daily   -Home medication- Losartan 50mg  BID - HELD for AKI     Hyperlipidemia   -Continue Lipitor 20mg  daily     Rheumatoid Arthritis   -Continue Methotrexate 10mg  weekly      Peptic Ulcer Disease   -Continue Protonix 40mg  daily       Nutrition: DIETARY ORAL SUPPLEMENTS Product Name: Magic Cup - Vanilla; Frequency: LUNCH/DINNER; Number of Containers: 1 Each  DIET CARDIAC Consistency/thickening: PUREE; Instructional: POSITION AS UPRIGHT AS POSSIBLE (BETWEEN 45-90 DEGREES) UNLESS CONTRAINDICATED   DVT PPx: Heparin 5,000U SQ TID  Code Status:  No CPR      Mila Homer Semaje Kinker, MD, PGY-I  Case discussed with attending physician.   I have independently seen and examined the patient on the date of service.  I have discussed the history, presentation, and plan of care in detail with the Resident.  I have reviewed the document above for medical content and agree as documented.  Not all grammatical errors, misspellings, transcription misinterpretations, and punctuation errors have been corrected.     Exam:  Cardiovascular: Agree as documented  Respiratory: Agree as documented  Leonard Downing MD

## 2023-02-15 NOTE — Care Plan (Signed)
Problem: Adult Inpatient Plan of Care  Goal: Plan of Care Review  Outcome: Ongoing (see interventions/notes)     Problem: Oral Intake Inadequate  Goal: Improved Oral Intake  Outcome: Ongoing (see interventions/notes)     Problem: Malnutrition  Goal: Improved Nutritional Intake  Outcome: Ongoing (see interventions/notes)   [facility]  [facility]  Medical Nutrition Therapy Follow Up    ASSESSMENT: No current wt, last wt recorded (6/27).  Pt has developed PNA.  SLP completed a SE (7/3) and has decreased consistency to puree which she is tolerating.  Continues to receive Megace to stimulate appetite, physician added Remeron (7/4).  Appetite remains poor at this time, 0-25%  needs encouragement to eat.  Consuming Magic Cups at times.     Current Diet Order/Nutrition Support:  DIETARY ORAL SUPPLEMENTS Product Name: Magic Cup - Vanilla; Frequency: LUNCH/DINNER; Number of Containers: 1 Each  DIET CARDIAC Consistency/thickening: PUREE; Instructional: POSITION AS UPRIGHT AS POSSIBLE (BETWEEN 45-90 DEGREES) UNLESS CONTRAINDICATED     Height Used for Calculations: 160 cm (5' 2.99")  Weight Used For Calculations: 53 kg (116 lb 13.5 oz)  BMI (kg/m2): 20.75  BMI Assessment: BMI 18.5-24.9: normal  Ideal Body Weight (IBW) (kg): 52.7  % Ideal Body Weight: 100.57     Usual Body Weight: 75 kg (165 lb 5.5 oz)          Re-assessed needs if applicable:    Energy Calorie Requirements: 1590-1855 per day (25-30kcals/53kg)  Protein Requirements (gms/day): 64-80 gms per day (1.2-1.5g/53kg)  Fluid Requirements: 1325-1590 ml per day (25-74mLs/53kg)    NUTRITION DIAGNOSIS: Unspecified severe protein calorie malnutrition related to Current medical condition, Pathophysiological  Prolonged catabolic illness, Increased biological demands for nutrients, and Increased energy needs as evidenced by Unintentional weight loss , Decreased oral intake , Normal/usual/poor oral intake, Edema/ ascites, GI symptoms , Medical condition  associated with diagnosis/ treatment,   .    INTERVENTION:   Cardiac diet, Puree consistent foods  Magic Cup BID     GOAL:   Consume > 50% of meals and supplements    MONITORING/EVALUATION:   Not meeting nutrition goal, continue      Jerel Shepherd, RD  02/15/2023, 07:47

## 2023-02-15 NOTE — Care Plan (Signed)
[facility]  Rehabilitation Services  Occupational Therapy Progress Note    Patient Name: Theresa Greene  Date of Birth: [birth year]  Height:  160 cm (5\' 3" )  Weight:  53.1 kg (117 lb)  Room/Bed: 328/A  Payor: MEDICARE / Plan: MEDICARE PART A AND B / Product Type: Medicare /     Assessment:    Pt tolerated treatment fairly, continuing to be limited by gross weakness, impaired balance, and impaired activity tolerance. Pt may benefit from continued OT services to improve IND/safety w/ ADLs, functional mobility, and functional transfers.     Discharge Disposition: SNF, TBD     02/15/23 1109   Therapist Pager   OT Assigned/ Pager # [phone]   Rehab Session   Document Type therapy progress note (daily note)   Total OT Minutes: 38   Daily Activity AM-PAC/6-clicks Score   Putting on/Taking off clothing on lower body 3   Bathing 3   Toileting 3   Putting on/Taking off clothing on upper body 4   Personal grooming 4   Eating Meals 4   Raw Score Total 21   Standardized (t-scale) Score 44.27   CMS 0-100% Score 32.79   CMS Modifier CJ         Plan:   Continue to follow patient according to established plan of care.  The risks/benefits of therapy have been discussed with the patient/caregiver and he/she is in agreement with the established plan of care.     Subjective & Objective:     RN cleared pt for OT session. Pt was supine in bed, co-tx w/ PT to maximize safety/performance. Pt t/f supine to EOB w/ MOD A x 2, stood from EOB w/ MOD A x 2 and doffed depends w/ MAX A. Pt donned new depends w/ MOD/MAX A x 2 using FWW and t/f to [facility] w/ MAX A x 2. Pt completed toileting, stood w/ MOD A x 2 and peri-care was completed w/ assist. Donned depends w/ MAX A x 2, required one more seated rest break before standing again w/ MOD A x 2 using FWW. Pt t/f to BS chair w/ MAX A x 2 and was left seated, call button within reach and chair alarm set for safety. All lines intact and needs met at this time.     Therapist:   Celesta Gentile, MOTR/L     Start Time: 1031  End Time: 1009  Total Treatment Time: 38 minutes  Charges Entered: 1 OT ADL, 2 OT TA  Department Number: [id number]

## 2023-02-15 NOTE — Care Plan (Signed)
[facility]  Rehabilitation Services  Speech Therapy Progress Note    Patient Name: Theresa Greene  Date of Birth: Mar 24, 1935  Weight:  53.1 kg (117 lb)  Room/Bed: 328/A  Payor: MEDICARE / Plan: MEDICARE PART A AND B / Product Type: Medicare /      Date/Time of Admission: 02/07/2023  7:15 AM  Admitting Diagnosis:  Elevated troponin [R79.89]        Assessment:  Moderate oropharyngeal dysphagia- stable, difficulty using straws but able to use cup rim; esophageal dysphagia    JUSTIFICATION OF DISCHARGE RECOMMENDATION  Based on current diagnosis, functional performance prior to admission, and current  functional performance, this patient requires continued Speech services in order to achieve significant functional improvements in these   deficit areas: Swallowing.      Plan: Continue tx per POC.  Recommendations: Continue puree solids, thin liquids, no straws. Assist pt with drinking from cup rim  Results & Recommendations Discussed With:Patient and Nurse   Continue to follow patient according to established plan of care.  The risks/benefits of therapy have been discussed with the patient/caregiver and he/she is in agreement with the established plan of care.       Subjective:  Pt properly identified and treated in bedside chair of room 328. Pt awake, alert, and oriented. Pt with very quiet voice. O2 via NC. No visitors present.    Objective:  Pt participate in therapeutic trial feed to assess diet tolerance and possible upgrade from puree. Pt given sips of thin liquid. Unable to effectively suck liquid through straw but demonstrated adequate bolus control and transit via cup rim. No s/s of aspiration noted on 5/6 trials. Cough noted 1x following large liquid bolus. SLP explained importance of taking small sips. Understanding indicated. Pt declined trials of puree solids. SLP offered trials of mech soft solids for possible upgrade, but pt stated that she was content with puree currently and did not  want to upgrade at this time. SLP and pt discussed re-considering upgrade when pt feels stronger and has more energy. Pt indicated she wanted to move from chair to bed. RN notified. No additional needs voiced at this time.      02/15/23 1426   Rehab Session   Document Type therapy progress note (daily note)   Applied Cognition Am PAC/6-clicks Score   Following/understand 10-76min speech or presentation 4   Understanding familiar people during ordinary conversations 4   Remembering to take medications at the appropriate time? 2   Remembering  where things were place/put away 3   Remembering a list of 4/5 errands without writing it down 2   Taking care of complicated tasks (managing a checking acct) 2   Raw Score Total 17   Standardized (t-scale) score 36.52   CMS 0-100% Score 47.12   CMS Modifier CK       Therapist:  Darrin Nipper, MA, CCC-SLP, [place] [id number]     Start Time: 1426  End Time: 1440  Total Time: 14 minutes  Charges Entered: SPST1  Department Number: [phone]

## 2023-02-15 NOTE — Care Management Notes (Signed)
Patient continues to not be medically ready for discharge. [employer] updated. Will need to check with [facility] about bed availability when patient is medically ready as they will be unable to continue to hold the bed, though they would like to accept if a bed is available at time of discharge. Patient will not need an insurance auth, so if she were to be d/c ready over the weekend, will need to call [facility] to see if they could accept.   Joseph Art, BSW

## 2023-02-15 NOTE — Care Plan (Signed)
Problem: Adult Inpatient Plan of Care  Goal: Plan of Care Review  Outcome: Ongoing (see interventions/notes)  Flowsheets (Taken 02/15/2023 1655)  Progress: no change  Goal: Patient-Specific Goal (Individualized)  Outcome: Ongoing (see interventions/notes)  Flowsheets (Taken 02/15/2023 1655)  Individualized Care Needs: update family on POC  Patient-Specific Goals (Include Timeframe): remain comfortable, visit with family  Goal: Absence of Hospital-Acquired Illness or Injury  Outcome: Ongoing (see interventions/notes)  Intervention: Identify and Manage Fall Risk  Recent Flowsheet Documentation  Taken 02/15/2023 1600 by Donzetta Kohut, NURSE EXTERN  Safety Promotion/Fall Prevention:   activity supervised   nonskid shoes/slippers when out of bed   safety round/check completed  Taken 02/15/2023 1400 by Donzetta Kohut, NURSE EXTERN  Safety Promotion/Fall Prevention:   activity supervised   nonskid shoes/slippers when out of bed   safety round/check completed  Taken 02/15/2023 1200 by Donzetta Kohut, NURSE EXTERN  Safety Promotion/Fall Prevention:   activity supervised   nonskid shoes/slippers when out of bed   safety round/check completed  Taken 02/15/2023 1000 by Donzetta Kohut, NURSE EXTERN  Safety Promotion/Fall Prevention:   activity supervised   nonskid shoes/slippers when out of bed   safety round/check completed  Taken 02/15/2023 0800 by Donzetta Kohut, NURSE EXTERN  Safety Promotion/Fall Prevention:   fall prevention program maintained   motion sensor pad activated   nonskid shoes/slippers when out of bed   safety round/check completed  Intervention: Prevent Skin Injury  Recent Flowsheet Documentation  Taken 02/15/2023 0800 by Donzetta Kohut, NURSE EXTERN  Body Position:   sitting   foot of bed elevated  Skin Protection: adhesive use limited  Intervention: Prevent and Manage VTE (Venous Thromboembolism) Risk  Recent Flowsheet Documentation  Taken 02/15/2023 0800 by Donzetta Kohut, NURSE EXTERN  VTE  Prevention/Management: anticoagulant therapy maintained  Intervention: Prevent Infection  Recent Flowsheet Documentation  Taken 02/15/2023 1600 by Donzetta Kohut, NURSE EXTERN  Infection Prevention: barrier precautions utilized  Taken 02/15/2023 1400 by Donzetta Kohut, NURSE EXTERN  Infection Prevention: barrier precautions utilized  Taken 02/15/2023 1200 by Donzetta Kohut, NURSE EXTERN  Infection Prevention: barrier precautions utilized  Taken 02/15/2023 1000 by Donzetta Kohut, NURSE EXTERN  Infection Prevention: rest/sleep promoted  Taken 02/15/2023 0800 by Donzetta Kohut, NURSE EXTERN  Infection Prevention: rest/sleep promoted  Goal: Optimal Comfort and Wellbeing  Outcome: Ongoing (see interventions/notes)  Intervention: Provide Person-Centered Care  Recent Flowsheet Documentation  Taken 02/15/2023 0800 by Donzetta Kohut, NURSE EXTERN  Trust Relationship/Rapport:   care explained   reassurance provided   choices provided  Goal: Rounds/Family Conference  Outcome: Ongoing (see interventions/notes)     Problem: Health Knowledge, Opportunity to Enhance (Adult,Obstetrics,Pediatric)  Goal: Knowledgeable about Health Subject/Topic  Description: Patient will demonstrate the desired outcomes by discharge/transition of care.  Outcome: Ongoing (see interventions/notes)  Intervention: Enhance Health Knowledge  Recent Flowsheet Documentation  Taken 02/15/2023 0800 by Donzetta Kohut, NURSE EXTERN  Family/Support System Care: self-care encouraged     Problem: Skin Injury Risk Increased  Goal: Skin Health and Integrity  Outcome: Ongoing (see interventions/notes)  Intervention: Optimize Skin Protection  Recent Flowsheet Documentation  Taken 02/15/2023 0800 by Donzetta Kohut, NURSE EXTERN  Pressure Reduction Techniques: Frequent weight shifting encouraged  Pressure Reduction Devices: Repositioning wedges/pillows utilized  Skin Protection: adhesive use limited  Activity Management: up in chair  Head of Bed (HOB) Positioning: HOB  elevated     Problem: Oral Intake Inadequate  Goal: Improved Oral Intake  Outcome: Ongoing (see interventions/notes)     Problem: Malnutrition  Goal: Improved Nutritional  Intake  Outcome: Ongoing (see interventions/notes)   Patient remains A/O x4. Telemetry monitored. Medications administered per order. Patient's vital signs remains stable. Skin dry and intact. Patient has been free of falls. Patient's mood has been appropriate. Education complete per patient care plan.

## 2023-02-15 NOTE — Care Plan (Signed)
[facility]  Rehabilitation Services  Physical Therapy Progress Note      Patient Name: Theresa Greene  Date of Birth: 05/01/1935  Height:  160 cm (5\' 3" )  Weight:  53.1 kg (117 lb)  Room/Bed: 328/A  Payor: MEDICARE / Plan: MEDICARE PART A AND B / Product Type: Medicare /     Assessment:        02/15/23 1110   Rehab Session   Document Type therapy progress note (daily note)   PT Visit Date 02/15/23   Total PT Minutes: 38   Symptoms Noted During/After Treatment fatigue   Symptoms Noted Comment significant weakness   Basic Mobility Am-PAC/6Clicks Score (APPROVED Staff)   Turning in bed without bedrails 2   Lying on back to sitting on edge of flat bed 2   Moving to and from a bed to a chair 2   Standing up from chair 2   Walk in room 1   Climbing 3-5 steps with railing 1   6 Clicks Raw Score total 10   Standardized (t-scale) score 28.13     Pt. Continues to be significantly weak. Follows directions well. Fatigues very easily. Requires 2 assist for all mobility.      D/C Disposition:    SNF    Plan:   Continue to follow patient according to established plan of care.  The risks/benefits of therapy have been discussed with the patient/caregiver and he/she is in agreement with the established plan of care.     Subjective & Objective:     Pt. Cleared for PT by Raynelle Fanning, RN. Pt. Agreeable to participate. Difficult to understand at times due to soft speech, breathy. Co-treat with OT to maximize functional mobility, safety, and participation. Pt. Transferred supine to sit with mod Ax2 and Vcs. Sitting EOB to acclimate to upright, postural strengthening with pt fatiguing easily in sitting. Sit to/from stand with mod A x2 with Vcs for pushing up, upright posture, head up with pt. Having difficulty completing/maintaining. Requested use of BSC. Completed std pivot transfer to [facility] with max A x2 and max Vcs for upright posture, stepping with WW. Padding to back of BSC as pt. Too fatigued to hold self upright without  back support. Returning to stand with mod A x2, dependent pericare and brief mgmt. Returned to sitting for rest break. Sit to stand mod A x2, std pivot transfer to recliner with max A x2 and max Vcs to complete turn, upright posture. Pt. Remains very weak, fatigues very easily.     Therapist:   Salome Spotted, MPT  [id number]    Start Time: 1031  End Time: 1109  AM Treatment Time: 38 minutes    Total Treatment Time: 38 minutes  Charges Entered: TAx3  Department Number: [id number]

## 2023-02-16 ENCOUNTER — Inpatient Hospital Stay (HOSPITAL_COMMUNITY): Payer: Medicare Other | Admitting: Radiology

## 2023-02-16 DIAGNOSIS — J439 Emphysema, unspecified: Secondary | ICD-10-CM

## 2023-02-16 LAB — MAGNESIUM: MAGNESIUM: 1.9 mg/dL (ref 1.8–2.6)

## 2023-02-16 LAB — CBC WITH DIFF
BASOPHIL #: <0.1 10*3/uL (ref <=0.20)
BASOPHIL %: 0.2 %
EOSINOPHIL #: 0.26 10*3/uL (ref <=0.50)
EOSINOPHIL %: 3.1 %
HCT: 34.1 % — ABNORMAL LOW (ref 34.8–46.0)
HGB: 11.7 g/dL (ref 11.5–16.0)
IMMATURE GRANULOCYTE #: 0.14 10*3/uL — ABNORMAL HIGH (ref <0.10)
IMMATURE GRANULOCYTE %: 1.6 % — ABNORMAL HIGH (ref 0.0–1.0)
LYMPHOCYTE #: 0.62 10*3/uL — ABNORMAL LOW (ref 1.00–4.80)
LYMPHOCYTE %: 7.3 %
MCH: 31 pg (ref 26.0–32.0)
MCHC: 34.3 g/dL (ref 31.0–35.5)
MCV: 90.5 fL (ref 78.0–100.0)
MONOCYTE #: 0.15 10*3/uL — ABNORMAL LOW (ref 0.20–1.10)
MONOCYTE %: 1.8 %
MPV: 11 fL (ref 8.7–12.5)
NEUTROPHIL #: 7.32 10*3/uL (ref 1.50–7.70)
NEUTROPHIL %: 86 %
PLATELETS: 218 10*3/uL (ref 150–400)
RBC: 3.77 10*6/uL — ABNORMAL LOW (ref 3.85–5.22)
RDW-CV: 17.3 % — ABNORMAL HIGH (ref 11.5–15.5)
WBC: 8.5 10*3/uL (ref 3.7–11.0)

## 2023-02-16 LAB — BASIC METABOLIC PANEL
ANION GAP: 13 mmol/L (ref 4–13)
BUN/CREA RATIO: 33 — ABNORMAL HIGH (ref 6–22)
BUN: 48 mg/dL — ABNORMAL HIGH (ref 8–25)
CALCIUM: 11.2 mg/dL — ABNORMAL HIGH (ref 8.6–10.3)
CHLORIDE: 109 mmol/L (ref 96–111)
CO2 TOTAL: 23 mmol/L (ref 23–31)
CREATININE: 1.45 mg/dL — ABNORMAL HIGH (ref 0.60–1.05)
ESTIMATED GFR - FEMALE: 35 mL/min/BSA — ABNORMAL LOW (ref >=60)
GLUCOSE: 117 mg/dL (ref 65–125)
POTASSIUM: 3.4 mmol/L — ABNORMAL LOW (ref 3.5–5.1)
SODIUM: 145 mmol/L (ref 136–145)

## 2023-02-16 LAB — POC BLOOD GLUCOSE (RESULTS): GLUCOSE, POC: 167 mg/dl (ref 80–130)

## 2023-02-16 LAB — PHOSPHORUS: PHOSPHORUS: 1.9 mg/dL — ABNORMAL LOW (ref 2.3–4.0)

## 2023-02-16 MED ORDER — ZOLEDRONIC ACID 4 MG/5 ML INTRAVENOUS SOLUTION
3.0000 mg | Freq: Once | INTRAVENOUS | Status: AC
Start: 2023-02-16 — End: 2023-02-16
  Administered 2023-02-16: 0 mg via INTRAVENOUS
  Administered 2023-02-16: 3 mg via INTRAVENOUS
  Filled 2023-02-16: qty 3.75

## 2023-02-16 MED ORDER — LOSARTAN 25 MG TABLET
25.0000 mg | ORAL_TABLET | Freq: Two times a day (BID) | ORAL | Status: DC
Start: 2023-02-16 — End: 2023-02-17
  Administered 2023-02-16 – 2023-02-17 (×2): 25 mg via ORAL
  Filled 2023-02-16 (×2): qty 1

## 2023-02-16 MED ORDER — SODIUM CHLORIDE 0.9 % INTRAVENOUS SOLUTION
20.0000 mmol | INTRAVENOUS | Status: AC
Start: 2023-02-16 — End: 2023-02-16
  Administered 2023-02-16: 20 mmol via INTRAVENOUS
  Administered 2023-02-16: 0 mmol via INTRAVENOUS
  Filled 2023-02-16: qty 6.67

## 2023-02-16 NOTE — Progress Notes (Signed)
Progress Note     Theresa Greene 88 y.o. female  Date of Birth: 1934/10/02  Date of Admit:  02/07/2023   Date of Service: 02/16/2023    Attending: Leonard Downing, MD Code Status:No CPR   PCP: Theresa Llano, DO      Summary:  Theresa Greene "Theresa Greene is an 88 year old female with a past medical history of paroxysmal atrial fibrillation, hypertension, hyperlipidemia, rheumatoid arthritis, CKD stage IIIB who presented as a transfer from [facility]. The patient initially presented to [facility] for generalized weakness. She stated that over the last two weeks she has had progressive weakness to the point that she required help to go to the restroom. She also admitted to falling in her bathroom on to the tile floor. She denied any injuries from her fall. At baseline she is typically able to ambulate with her walker. She also admitted to decreased oral intake over this same time period. The patient noted that she was diagnosed with COVID-19 in 2020 and has lost about 50lbs since this time due to decreased appetite and attributes that as a cause of her generalized weakness. She denied smoking history and stated that she is up to date on her cancer screenings. She denies any chest pain, shortness of breath, nausea, vomiting, or diarrhea. She was hypertensive on arrival to the ED at 134/57mmHg but was otherwise hemodynamically stable. Labs at the outside facility were significant for creatinine 2.36 (baseline approximately 1.1-1.2) and mildly elevated troponins at 31.2 --> 32.3. CXR showed no acute cardiopulmonary process. At the outside facility she received aspirin 324mg , hydralazine IV 10mg , 1.5L NS bolus, and was started on maintenance fluids of NS 100cc/hr. The patient was subsequently transferred to [facility] as [facility] not have any beds available. She was admitted to the academic medicine team for further evaluation and management. Patient's renal function is improving with fluid resuscitation. She was noted  to have hypercalcemia on her labs. Monoclonal gammopathy work-up was sent and Calcitriol was held. Patient continues to have elevated corrected calcium levels so calcitonin was ordered. Patient appears to be confused periodically throughout the day, this could be secondary to high calcium levels or prolonged [facility] stay. Due to her decline, neurology has been consulted, and CT brain, abdomen and chest have been ordered. Monoclonal gammopathy work-up came back today and showed no M-protein and a normal kappa/lamda ratio. CT brain abdomen chest showed no masses. Neurology recommends adding mirtazapine 15 mg to help increase appetite and improve sleep. Palliative went over goals of care and family and patient want conservative measures. Plan to go into a skilled nursing facility at [facility] if needed.  Antibiotic course completed.     Interval History:    Patient continues to be weak.  One time dose of Zoledronic acid administered today for hypercalcemia.  Family raised concerns regarding Remeron and melatonin both being given so we will continue with Remeron 7.5mg  and discontinue melatonin. Patient is saturating around 95% on room air. Started on Losartan 25 mg BID to better control BP.     Review of Systems:   Negative unless otherwise stated above.       Objective    Medical History     PMHx:    Past Medical History:   Diagnosis Date    Arthritis     Asthma     Back problem     CAD S/P percutaneous coronary angioplasty 2010    Pt reports 5 years ago    Charcot's joint of  foot, left 12/25/[facility]    Colon polyp     CVA (cerebrovascular accident) (CMS HCC) 1995    no residual deficits    Deaf     Diverticulitis of colon     ESBL (extended spectrum beta-lactamase) producing bacteria infection 02/08/2022    ESBL + Klebsiella pneumoniae urine 03/22/22, 02/08/22    Gastroesophageal reflux disease 12/25/[facility]    GI bleed     H/O hearing loss     History of cardiac pacemaker 12/25/[facility]    HTN (hypertension)     Hyperlipidemia  12/25/2019    Muscle weakness     Neuropathy (CMS HCC)     Pacemaker     Paroxysmal atrial fibrillation (CMS HCC) 12/25/[facility]    PUD (peptic ulcer disease) 12/25/[facility]    C/b GIB [facility]    Rheumatoid arthritis (CMS HCC) 12/25/[facility]    Stage 3 chronic kidney disease (CMS HCC) 12/25/2019    Last Assessment & Plan:  Renal function stable, at baseline Stable electrolytes; restart sodium bicarb 650 mg BID  Continue renal protective ARB Discussed CKD risk factor modifications, including tight BP/glucose control, low protein diet, and avoidance of NSAIDs    Stented coronary artery     Vitamin D deficiency 12/25/2019    Wears glasses       Allergies:    Allergies   Allergen Reactions    Iodinated Contrast Media Anaphylaxis    Iv Contrast Anaphylaxis     makese her serverly sick    Etodolac NO Steroids unless approved by Attending Physician    Symbicort [Budesonide-Formoterol]     Codeine  Other Adverse Reaction (Add comment) and Nausea/ Vomiting     Stomach pain  Stomach pain       Social History  Social History     Tobacco Use    Smoking status: Never    Smokeless tobacco: Never   Vaping Use    Vaping status: Never Used   Substance Use Topics    Alcohol use: Never    Drug use: Never     Family History  Family Medical History:       Problem Relation (Age of Onset)    Brain cancer Mother    Ovarian Cancer Mother    Pancreatic Cancer Father           Home Meds:   Dexlansoprazole, acetaminophen, amLODIPine, amiodarone, aspirin, atorvastatin, calcitrioL, cephalexin, ergocalciferol (vitamin D2), folic acid, gabapentin, lipase-protease-amylase, losartan, lysine, melatonin, methotrexate, metoprolol succinate, mirabegron, nitroGLYCERIN, sertraline, and sodium bicarbonate         Objective Findings     Physical Exam:  BP (!) 155/79   Pulse 72   Temp 36.5 C (97.7 F)   Resp 16   Ht 1.6 m (5\' 3" )   Wt 53.1 kg (117 lb)   SpO2 95%   BMI 20.73 kg/m     General: Adult female who appears stated age, no apparent distress,  cachetic, chronically ill appearing   HEENT:  Normocephalic/atraumatic  Cardio: RRR, no murmurs heard, no peripheral edema  Resp: Bilateral crackles   Abd: Soft, NT, ND, Positive BS  Skin: Warm and dry. No lesions or rashes noted.   Neuro: Alert and oriented x3, CN II-XII grossly intact. Decreased muscle strength. No sensory deficits.     Summary of Lab Work and Diagnostic Studies:     CBC   Recent Labs     02/14/23  0541 02/15/23  0415 02/16/23  0655   WBC 7.7 3.8 8.5  HGB 10.6* 9.6* 11.7   HCT 31.4* 30.2* 34.1*   PLTCNT 313 274 218      Chemistries   Recent Labs     02/14/23  0541 02/15/23  0415 02/16/23  0655   SODIUM 144 145 145   POTASSIUM 3.8 3.3* 3.4*   CHLORIDE 107 108 109   CO2 26 25 23    BUN 54* 55* 48*   CREATININE 1.86* 1.72* 1.45*   CALCIUM 10.7* 10.4* 11.2*   ALBUMIN  --  2.0*  --    MAGNESIUM 2.0 2.0 1.9   PHOSPHORUS 3.5 3.0 1.9*      Liver Enzymes   Recent Labs     02/15/23  0415   TOTALPROTEIN 5.0*   ALBUMIN 2.0*   AST 22   ALT 18   ALKPHOS 60      Inflammatory Markers   No results for input(s): "CRP", "ESR", "LACTICACID" in the last 72 hours.     PROCALCITONIN   Cardiac and Coags   No results for input(s): "UHCEASTTROPI", "BNP", "INR" in the last 72 hours.    Invalid input(s): "PTT", "PT"   Lipid Panel   No results for input(s): "HDL" in the last 72 hours.    Invalid input(s): "LDL", "CHOL"       Results for orders placed or performed during the [facility] encounter of 02/07/23 (from the past 24 hour(s))   XR AP MOBILE CHEST     Status: None    Narrative    EXAMINATION: Chest AP portable    HISTORY: Shortness of breath.    AP portable view of the chest was obtained. Comparison made to an exam dated 02/11/2023. Heart size is normal. Right subclavian pacemaker is unchanged.    Lungs are emphysematous. Previously noted multifocal right lung infiltrates have improved very slightly, as has the left basilar infiltrate noted previously. Small left pleural effusion has nearly resolved as well.      Impression     1. Emphysema with improving bilateral pulmonary infiltrates and resolving small left pleural effusion.      Radiologist location ID: [id number]                  Assessment & Plan     [facility] Problems    Diagnosis    Primary Problem: Elevated troponin    FTT (failure to thrive) in adult    Generalized weakness    Acute kidney injury (CMS HCC)    Stage 3b chronic kidney disease (CMS HCC)    Hyperlipidemia    HTN (hypertension)    Rheumatoid arthritis (CMS HCC)    PUD (peptic ulcer disease)    Paroxysmal atrial fibrillation (CMS HCC)       Generalized Weakness / Fall/ Failure to Thrive  -Patient presented to outside facility due to progressive generalized weakness and fall  -Likely secondary to chronic deconditioning, malnutrition, and poor PO intake   -PT/OT/Speech therapy ordered  -Fall precautions in place   - Neurology recommends mirtazapine to increased appetite and sleep.  Less likely muscle disease/neuromuscular emergency.  - Continue Remeron 7.5 mg for GFR <30.    -  CT brain/chest/abdomen/pelvis showed no discrete evidence to suggest mass or adenopathy.    - CK was 45, normal.  - ESR was high, 34.    Electrolyte Imbalance  - Potassium was 3.4  - Phosphorus was 1.9  - Replaced with K-Phos of 20 mmol  - Will continue to monitor labs and replace as needed  Community Acquired Pneumonia (Suspected Bacterial, Unknown Organism)   -Patient spiked fever of 37.8 overnight on 02/10/23  -Procalcitonin 0.43, normal  -Respiratory viral panel was negative   -Streptococcus pneumonia antigen, legionella antigen, and MRSA screen negative  - UA screen negative  - Chest xray showed bilateral multifocal pneumonia  - CT chest shows ground-glass interstitial densities suggestive of infiltrate or pulmonary edema.    - Completed 5 days of Rocephin (2 mg IV once daily) and Doxycyline (100 mg IV twice daily)  - Chest x-ray (02/16/23) shows emphysema with improving bilateral pulmonary infiltrates and resolving small left  pleural effusion      Acute Metabolic Encephalopathy  - Likely secondary to elevated corrected calcium levels, or prolonged [facility] stay or infection  - UA was not suggestive of infection  - Chest xray revealed pneumonia, patient started on antibiotics.   - Status post Miacalcin 200 units SC x3  - Zoledronic acid 3 mg IV x1  - Strict delirium precautions in place.    Hypercalcemia  -Potential etiologies include mediation vs malignancy (Calcitrol vs AKI)  - Can not rule out malignancy  -Corrected calcium 13.3   -PTH low at 7.1  - PTHrP workup in progress  - Status post Miacalcin 200 units SC x3  - Zoledronic acid 3 mg IV x1  -Monoclonal gammopathy work-up showed no M-protein and normal kappa/lamda ratio  -Home medication- Calcitriol 0.24mcg three times weekly- HELD      Acute Kidney Injury on Chronic Kidney Disease Stage IIIB- Resolved   -prerenal likely secondary to poor PO intake  -Creatinine 2.36 on presentation to outside facility (baseline approximately 1.1-1.2)  - Cr was 1.45 today.  Trending downwards  -US kidney showed no evidence of obstruction   -Avoid nephrotoxic agents     Acute Hypoxic Respiratory Failure - Resolved   -Likely secondary to pneumonia   -Patient no longer requiring 4 L of NC.  Patient has been on room air today  -CXR (02/16/23) shows improving bilateral pulmonary infiltrates and resolving small left pleural effusion      Elevated Troponin  -Likely secondary to acute kidney injury   -Patient reports no chest pain   -Troponins peaked at 42.4   -EKG showed an atrial paced rhythm with a non-specific T wave abnormality   -Continuous telemetry      Paroxysmal Atrial Fibrillation   -Currently rate controlled  -Continue Amiodarone 200mg  daily and Toprol XL 100mg  daily   -Continuous telemetry      Protein Calorie Malnutrition   Patient meets criteria based on the following clinical characteristics:   Malnutrition (Undernutrition) Diagnosis  Malnutrition Chronicity: Chronic illness  Malnutrition  Severity: Severe protein calorie malnutrition  Related to: Chronic illness  Energy Intake: Less than 75% of EER in greater than or equal to 1 month  Body Fat Loss: Severe  Muscle Mass Loss: Severe  Nutrition consulted  Nutrition Treatment Plan: Initiate oral supplement, Initiate vitamin/mineral supplement(s), Monitor weight trends  MNT protocol ordered  Monitor bowel functions  Continue Megace 800mg  daily   Lab Results   Component Value Date    ALBUMIN 2.0 (L) 02/15/2023     Hypertension   -Remains hypertensive   -Continue Toprol XL 100mg  daily and Amlodipine increased to 10mg  daily   - Started losartan 25 mg b.i.d     Hyperlipidemia   -Continue Lipitor 20mg  daily     Rheumatoid Arthritis   -Continue Methotrexate 10mg  weekly      Peptic Ulcer Disease   -Continue Protonix  40mg  daily       Nutrition: DIETARY ORAL SUPPLEMENTS Product Name: Magic Cup - Vanilla; Frequency: LUNCH/DINNER; Number of Containers: 1 Each  DIET CARDIAC Consistency/thickening: PUREE; Instructional: POSITION AS UPRIGHT AS POSSIBLE (BETWEEN 45-90 DEGREES) UNLESS CONTRAINDICATED   DVT PPx: Heparin 5,000U SQ TID  Code Status: No CPR      Mila Homer Nyana Haren, MD, PGY-I  Case discussed with attending physician.     I have independently seen and examined the patient on the date of service.  I have discussed the history, presentation, and plan of care in detail with the resident.  I have reviewed the document above for medical content and agree as documented.  Not all grammatical errors, misspellings, transcription misinterpretations, and punctuation errors have been corrected.     Exam:  Cardiovascular: Agree as documented  Respiratory: Agree as documented  Theresa Downing MD

## 2023-02-16 NOTE — Care Plan (Signed)
Patient alert and oriented to self and place. Respirations appear even and unlabored on room air. Tele monitor audible and intact. Denies any shortness of breath or chest pain at this time. Denies any pain at this time. No acute events this shift, patient plan of care discussed, safe measures in place and maintained,will continue current plan of care.  Call light within reach.        Problem: Adult Inpatient Plan of Care  Goal: Plan of Care Review  02/16/2023 1753 by Sherre Scarlet, LPN  Outcome: Ongoing (see interventions/notes)  02/16/2023 1736 by Sherre Scarlet, LPN  Outcome: Ongoing (see interventions/notes)  Goal: Patient-Specific Goal (Individualized)  02/16/2023 1753 by Sherre Scarlet, LPN  Outcome: Ongoing (see interventions/notes)  02/16/2023 1736 by Sherre Scarlet, LPN  Outcome: Ongoing (see interventions/notes)  Goal: Absence of Hospital-Acquired Illness or Injury  02/16/2023 1753 by Sherre Scarlet, LPN  Outcome: Ongoing (see interventions/notes)  02/16/2023 1736 by Sherre Scarlet, LPN  Outcome: Ongoing (see interventions/notes)  Intervention: Identify and Manage Fall Risk  Recent Flowsheet Documentation  Taken 02/16/2023 1400 by Sherre Scarlet, LPN  Safety Promotion/Fall Prevention:   activity supervised   fall prevention program maintained   nonskid shoes/slippers when out of bed   safety round/check completed  Taken 02/16/2023 1200 by Sherre Scarlet, LPN  Safety Promotion/Fall Prevention:   activity supervised   fall prevention program maintained   nonskid shoes/slippers when out of bed   safety round/check completed  Taken 02/16/2023 1000 by Sherre Scarlet, LPN  Safety Promotion/Fall Prevention:   activity supervised   fall prevention program maintained   nonskid shoes/slippers when out of bed   safety round/check completed  Taken 02/16/2023 0800 by Sherre Scarlet, LPN  Safety Promotion/Fall Prevention:   activity supervised   fall prevention program maintained   nonskid  shoes/slippers when out of bed   safety round/check completed  Intervention: Prevent Skin Injury  Recent Flowsheet Documentation  Taken 02/16/2023 1752 by Sherre Scarlet, LPN  Body Position: sitting  Taken 02/16/2023 1600 by Sherre Scarlet, LPN  Body Position: sitting  Taken 02/16/2023 1400 by Sherre Scarlet, LPN  Body Position: sitting  Taken 02/16/2023 1000 by Sherre Scarlet, LPN  Body Position: sitting  Taken 02/16/2023 0800 by Sherre Scarlet, LPN  Body Position: sitting  Intervention: Prevent and Manage VTE (Venous Thromboembolism) Risk  Recent Flowsheet Documentation  Taken 02/16/2023 0800 by Sherre Scarlet, LPN  VTE Prevention/Management: anticoagulant therapy maintained  Intervention: Prevent Infection  Recent Flowsheet Documentation  Taken 02/16/2023 0800 by Sherre Scarlet, LPN  Infection Prevention: barrier precautions utilized

## 2023-02-16 NOTE — Care Plan (Signed)
Problem: Adult Inpatient Plan of Care  Goal: Plan of Care Review  Outcome: Ongoing (see interventions/notes)  Goal: Patient-Specific Goal (Individualized)  Outcome: Ongoing (see interventions/notes)  Goal: Absence of Hospital-Acquired Illness or Injury  Outcome: Ongoing (see interventions/notes)  Intervention: Identify and Manage Fall Risk  Recent Flowsheet Documentation  Taken 02/15/2023 2000 by Payton Emerald, RN  Safety Promotion/Fall Prevention:   safety round/check completed   activity supervised  Intervention: Prevent Skin Injury  Recent Flowsheet Documentation  Taken 02/15/2023 2000 by Payton Emerald, RN  Body Position: supine, head elevated  Skin Protection: adhesive use limited  Intervention: Prevent and Manage VTE (Venous Thromboembolism) Risk  Recent Flowsheet Documentation  Taken 02/15/2023 2000 by Payton Emerald, RN  VTE Prevention/Management: anticoagulant therapy maintained  Intervention: Prevent Infection  Recent Flowsheet Documentation  Taken 02/15/2023 2000 by Payton Emerald, RN  Infection Prevention: barrier precautions utilized  Goal: Optimal Comfort and Wellbeing  Outcome: Ongoing (see interventions/notes)  Goal: Rounds/Family Conference  Outcome: Ongoing (see interventions/notes)     Problem: Health Knowledge, Opportunity to Enhance (Adult,Obstetrics,Pediatric)  Goal: Knowledgeable about Health Subject/Topic  Description: Patient will demonstrate the desired outcomes by discharge/transition of care.  Outcome: Ongoing (see interventions/notes)     Problem: Skin Injury Risk Increased  Goal: Skin Health and Integrity  Outcome: Ongoing (see interventions/notes)  Intervention: Optimize Skin Protection  Recent Flowsheet Documentation  Taken 02/15/2023 2000 by Payton Emerald, RN  Pressure Reduction Techniques: Frequent weight shifting encouraged  Pressure Reduction Devices: Repositioning wedges/pillows utilized  Skin Protection: adhesive use limited  Head of Bed (HOB) Positioning: HOB at 60 degrees      Problem: Oral Intake Inadequate  Goal: Improved Oral Intake  Outcome: Ongoing (see interventions/notes)     Problem: Malnutrition  Goal: Improved Nutritional Intake  Outcome: Ongoing (see interventions/notes)   Patient has done well throughout the night.  Vital signs stable.  Call light within easy reach.  Safety measures in place.  Safety maintained.

## 2023-02-16 NOTE — Care Plan (Deleted)
Patient alert and oriented x4. Respirations appear even and unlabored on room air. Tele monitor audible and intact.Patient states she is having SOB;RN stopped blood transfusion, H&H was drawn and patient sent down for chest x-ray.   Patient plan of care discussed, safe measures in place and maintained, will continue current plan of care.     Problem: Adult Inpatient Plan of Care  Goal: Plan of Care Review  Outcome: Ongoing (see interventions/notes)  Goal: Patient-Specific Goal (Individualized)  Outcome: Ongoing (see interventions/notes)  Goal: Absence of Hospital-Acquired Illness or Injury  Outcome: Ongoing (see interventions/notes)  Intervention: Identify and Manage Fall Risk  Recent Flowsheet Documentation  Taken 02/16/2023 1400 by Sherre Scarlet, LPN  Safety Promotion/Fall Prevention:   activity supervised   fall prevention program maintained   nonskid shoes/slippers when out of bed   safety round/check completed  Taken 02/16/2023 1200 by Sherre Scarlet, LPN  Safety Promotion/Fall Prevention:   activity supervised   fall prevention program maintained   nonskid shoes/slippers when out of bed   safety round/check completed  Taken 02/16/2023 1000 by Sherre Scarlet, LPN  Safety Promotion/Fall Prevention:   activity supervised   fall prevention program maintained   nonskid shoes/slippers when out of bed   safety round/check completed  Taken 02/16/2023 0800 by Sherre Scarlet, LPN  Safety Promotion/Fall Prevention:   activity supervised   fall prevention program maintained   nonskid shoes/slippers when out of bed   safety round/check completed  Intervention: Prevent Skin Injury  Recent Flowsheet Documentation  Taken 02/16/2023 1600 by Sherre Scarlet, LPN  Body Position: sitting  Taken 02/16/2023 1400 by Sherre Scarlet, LPN  Body Position: sitting  Taken 02/16/2023 1000 by Sherre Scarlet, LPN  Body Position: sitting  Taken 02/16/2023 0800 by Sherre Scarlet, LPN  Body Position: sitting  Intervention:  Prevent and Manage VTE (Venous Thromboembolism) Risk  Recent Flowsheet Documentation  Taken 02/16/2023 0800 by Sherre Scarlet, LPN  VTE Prevention/Management: anticoagulant therapy maintained  Intervention: Prevent Infection  Recent Flowsheet Documentation  Taken 02/16/2023 0800 by Sherre Scarlet, LPN  Infection Prevention: barrier precautions utilized

## 2023-02-17 DIAGNOSIS — N1832 Chronic kidney disease, stage 3b (CMS HCC): Secondary | ICD-10-CM

## 2023-02-17 DIAGNOSIS — N179 Acute kidney failure, unspecified: Principal | ICD-10-CM

## 2023-02-17 DIAGNOSIS — G9341 Metabolic encephalopathy: Secondary | ICD-10-CM

## 2023-02-17 DIAGNOSIS — I480 Paroxysmal atrial fibrillation: Secondary | ICD-10-CM

## 2023-02-17 DIAGNOSIS — I129 Hypertensive chronic kidney disease with stage 1 through stage 4 chronic kidney disease, or unspecified chronic kidney disease: Secondary | ICD-10-CM

## 2023-02-17 DIAGNOSIS — E785 Hyperlipidemia, unspecified: Secondary | ICD-10-CM

## 2023-02-17 LAB — CBC WITH DIFF
BASOPHIL #: <0.1 10*3/uL (ref <=0.20)
BASOPHIL %: 0.2 %
EOSINOPHIL #: 0.53 10*3/uL — ABNORMAL HIGH (ref <=0.50)
EOSINOPHIL %: 6 %
HCT: 30.2 % — ABNORMAL LOW (ref 34.8–46.0)
HGB: 10.2 g/dL — ABNORMAL LOW (ref 11.5–16.0)
IMMATURE GRANULOCYTE #: 0.1 10*3/uL — ABNORMAL HIGH (ref <0.10)
IMMATURE GRANULOCYTE %: 1.1 % — ABNORMAL HIGH (ref 0.0–1.0)
LYMPHOCYTE #: 0.59 10*3/uL — ABNORMAL LOW (ref 1.00–4.80)
LYMPHOCYTE %: 6.7 %
MCH: 31.2 pg (ref 26.0–32.0)
MCHC: 33.8 g/dL (ref 31.0–35.5)
MCV: 92.4 fL (ref 78.0–100.0)
MONOCYTE #: 0.22 10*3/uL (ref 0.20–1.10)
MONOCYTE %: 2.5 %
MPV: 11 fL (ref 8.7–12.5)
NEUTROPHIL #: 7.33 10*3/uL (ref 1.50–7.70)
NEUTROPHIL %: 83.5 %
PLATELETS: 251 10*3/uL (ref 150–400)
RBC: 3.27 10*6/uL — ABNORMAL LOW (ref 3.85–5.22)
RDW-CV: 17.1 % — ABNORMAL HIGH (ref 11.5–15.5)
WBC: 8.8 10*3/uL (ref 3.7–11.0)

## 2023-02-17 LAB — BASIC METABOLIC PANEL
ANION GAP: 9 mmol/L (ref 4–13)
BUN/CREA RATIO: 32 — ABNORMAL HIGH (ref 6–22)
BUN: 39 mg/dL — ABNORMAL HIGH (ref 8–25)
CALCIUM: 10.7 mg/dL — ABNORMAL HIGH (ref 8.6–10.3)
CHLORIDE: 108 mmol/L (ref 96–111)
CO2 TOTAL: 27 mmol/L (ref 23–31)
CREATININE: 1.22 mg/dL — ABNORMAL HIGH (ref 0.60–1.05)
ESTIMATED GFR - FEMALE: 43 mL/min/BSA — ABNORMAL LOW (ref >=60)
GLUCOSE: 95 mg/dL (ref 65–125)
POTASSIUM: 3.4 mmol/L — ABNORMAL LOW (ref 3.5–5.1)
SODIUM: 144 mmol/L (ref 136–145)

## 2023-02-17 LAB — MAGNESIUM: MAGNESIUM: 1.8 mg/dL (ref 1.8–2.6)

## 2023-02-17 LAB — PHOSPHORUS: PHOSPHORUS: 2 mg/dL — ABNORMAL LOW (ref 2.3–4.0)

## 2023-02-17 MED ORDER — POLYETHYLENE GLYCOL 3350 17 GRAM ORAL POWDER PACKET
34.0000 g | Freq: Every day | ORAL | Status: DC
Start: 2023-02-17 — End: 2023-02-18
  Administered 2023-02-17 – 2023-02-18 (×2): 34 g via ORAL
  Filled 2023-02-17: qty 2

## 2023-02-17 MED ORDER — POTASSIUM BICARBONATE-CITRIC ACID 20 MEQ EFFERVESCENT TABLET
40.0000 meq | EFFERVESCENT_TABLET | ORAL | Status: AC
Start: 2023-02-17 — End: 2023-02-17
  Administered 2023-02-17: 40 meq via ORAL
  Filled 2023-02-17: qty 2

## 2023-02-17 MED ORDER — LOSARTAN 50 MG TABLET
50.0000 mg | ORAL_TABLET | Freq: Two times a day (BID) | ORAL | Status: DC
Start: 2023-02-17 — End: 2023-02-18
  Administered 2023-02-17 – 2023-02-18 (×2): 50 mg via ORAL
  Filled 2023-02-17 (×2): qty 1

## 2023-02-17 MED ORDER — SODIUM CHLORIDE 0.9 % INTRAVENOUS SOLUTION
30.0000 mmol | Freq: Once | INTRAVENOUS | Status: AC
Start: 2023-02-17 — End: 2023-02-17
  Administered 2023-02-17: 0 mmol via INTRAVENOUS
  Administered 2023-02-17: 30 mmol via INTRAVENOUS
  Filled 2023-02-17: qty 10

## 2023-02-17 MED ORDER — LACTULOSE 20 GRAM/30 ML ORAL SOLUTION
30.0000 mL | Freq: Every day | ORAL | Status: DC
Start: 2023-02-17 — End: 2023-02-18
  Administered 2023-02-17: 30 mL via ORAL
  Administered 2023-02-18: 0 mL via ORAL
  Filled 2023-02-17 (×2): qty 30

## 2023-02-17 MED ORDER — LOSARTAN 25 MG TABLET
25.0000 mg | ORAL_TABLET | ORAL | Status: AC
Start: 2023-02-17 — End: 2023-02-17
  Administered 2023-02-17: 25 mg via ORAL
  Filled 2023-02-17 (×2): qty 1

## 2023-02-17 MED ORDER — HYDRALAZINE 20 MG/ML INJECTION SOLUTION
10.0000 mg | Freq: Once | INTRAMUSCULAR | Status: AC
Start: 2023-02-17 — End: 2023-02-17
  Administered 2023-02-17: 10 mg via INTRAVENOUS
  Filled 2023-02-17: qty 1

## 2023-02-17 MED ORDER — OLANZAPINE 10 MG INTRAMUSCULAR SOLUTION
2.5000 mg | Freq: Once | INTRAMUSCULAR | Status: AC
Start: 2023-02-17 — End: 2023-02-17
  Administered 2023-02-17: 2.5 mg via INTRAMUSCULAR
  Filled 2023-02-17: qty 2

## 2023-02-17 NOTE — Progress Notes (Signed)
Progress Note     Theresa Greene 88 y.o. female  Date of Birth: 1934-09-22  Date of Admit:  02/07/2023   Date of Service: 02/17/2023    Attending: Leonard Downing, MD Code Status:No CPR   PCP: [facility], DO      Summary:  Theresa Greene is an 88 year old female with a past medical history of paroxysmal atrial fibrillation, hypertension, hyperlipidemia, rheumatoid arthritis, CKD stage IIIB who presented as a transfer from [facility]. The patient initially presented to [facility] for generalized weakness. She stated that over the last two weeks she has had progressive weakness to the point that she required help to go to the restroom. She also admitted to falling in her bathroom on to the tile floor. She denied any injuries from her fall. At baseline she is typically able to ambulate with her walker. She also admitted to decreased oral intake over this same time period. The patient noted that she was diagnosed with COVID-19 in 2020 and has lost about 50lbs since this time due to decreased appetite and attributes that as a cause of her generalized weakness. She denied smoking history and stated that she is up to date on her cancer screenings. She denied any chest pain, shortness of breath, nausea, vomiting, or diarrhea. She was hypertensive on arrival to the ED at 134/66mmHg but was otherwise hemodynamically stable. Labs at the outside facility were significant for creatinine 2.36 (baseline approximately 1.1-1.2) and mildly elevated troponins at 31.2 --> 32.3. CXR showed no acute cardiopulmonary process. The patient was subsequently transferred to [facility] as [facility] did not have any beds available. She was admitted to the academic medicine team for further evaluation and management. Patient's renal function improved with fluid resuscitation. She was noted to have hypercalcemia on her labs. Monoclonal gammopathy work-up was sent which revealed no M protein and a normal kappa/lambda ratio. CT  brain/chest/abdomen/pelvis showed no evidence of mass or adenopathy. She received 3 doses of Calcitonin and 1 dose of Zoledronic acid. CXR revealed bilateral multifocal pneumonia and CT chest showed ground glass interstitial densities for which she received five days of Rocephin and Doxycycline. Patient exhibited intermittent confusion and progressive weakness during hospitalization, neurology was consulted who recommended addition of Mirtazapine to increase appetite and improve sleep. Palliative care was consulted and patient/family do not want aggressive treatment. Plan to go to the skilled nursing facility once medically stable.     Interval History:    Patient was agitated overnight requiring 1x dose of IM Zyprexa. Blood pressure remains elevated with systolic blood pressure up to 536'U. Losartan increased to home dose of 50mg  BID. Patient continues to demonstrate severe weakness.     Review of Systems:   Negative unless otherwise stated above.       Objective    Medical History     PMHx:    Past Medical History:   Diagnosis Date    Arthritis     Asthma     Back problem     CAD S/P percutaneous coronary angioplasty 2010    Pt reports 5 years ago    Charcot's joint of foot, left 08/06/2014    Colon polyp     CVA (cerebrovascular accident) (CMS HCC) 1995    no residual deficits    Deaf     Diverticulitis of colon     ESBL (extended spectrum beta-lactamase) producing bacteria infection 02/08/2022    ESBL + Klebsiella pneumoniae urine 03/22/22, 02/08/22    Gastroesophageal reflux disease  08/06/2014    GI bleed     H/O hearing loss     History of cardiac pacemaker 08/06/2014    HTN (hypertension)     Hyperlipidemia 12/25/2019    Muscle weakness     Neuropathy (CMS HCC)     Pacemaker     Paroxysmal atrial fibrillation (CMS HCC) 08/06/2014    PUD (peptic ulcer disease) 08/06/2014    C/b GIB Spring 2015    Rheumatoid arthritis (CMS HCC) 08/06/2014    Stage 3 chronic kidney disease (CMS HCC) 12/25/2019    Last Assessment &  Plan:  Renal function stable, at baseline Stable electrolytes; restart sodium bicarb 650 mg BID  Continue renal protective ARB Discussed CKD risk factor modifications, including tight BP/glucose control, low protein diet, and avoidance of NSAIDs    Stented coronary artery     Vitamin D deficiency 12/25/2019    Wears glasses       Allergies:    Allergies   Allergen Reactions    Iodinated Contrast Media Anaphylaxis    Iv Contrast Anaphylaxis     makese her serverly sick    Etodolac NO Steroids unless approved by Attending Physician    Symbicort [Budesonide-Formoterol]     Codeine  Other Adverse Reaction (Add comment) and Nausea/ Vomiting     Stomach pain  Stomach pain       Social History  Social History     Tobacco Use    Smoking status: Never    Smokeless tobacco: Never   Vaping Use    Vaping status: Never Used   Substance Use Topics    Alcohol use: Never    Drug use: Never     Family History  Family Medical History:       Problem Relation (Age of Onset)    Brain cancer Mother    Ovarian Cancer Mother    Pancreatic Cancer Father           Home Meds:   Dexlansoprazole, acetaminophen, amLODIPine, amiodarone, aspirin, atorvastatin, calcitrioL, cephalexin, ergocalciferol (vitamin D2), folic acid, gabapentin, lipase-protease-amylase, losartan, lysine, melatonin, methotrexate, metoprolol succinate, mirabegron, nitroGLYCERIN, sertraline, and sodium bicarbonate         Objective Findings     Physical Exam:  BP (!) 174/81   Pulse 70   Temp 36.7 C (98.1 F)   Resp 16   Ht 1.6 m (5\' 3" )   Wt 53.1 kg (117 lb)   SpO2 95%   BMI 20.73 kg/m     General: Adult female who appears stated age, no apparent distress, cachetic, chronically ill appearing   HEENT: Normocephalic/atraumatic  Cardio: RRR, no murmurs heard, no peripheral edema  Resp: CTA bilaterally, normal respiratory effort   Abd: Soft, NT, ND, Positive BS  Skin: Warm and dry. No lesions or rashes noted.   Neuro: Alert and oriented x3, CN II-XII grossly intact.  Decreased muscle strength. No sensory deficits.     Summary of Lab Work and Diagnostic Studies:     CBC   Recent Labs     02/15/23  0415 02/16/23  0655 02/17/23  0435   WBC 3.8 8.5 8.8   HGB 9.6* 11.7 10.2*   HCT 30.2* 34.1* 30.2*   PLTCNT 274 218 251      Chemistries   Recent Labs     02/15/23  0415 02/16/23  0655 02/17/23  0435   SODIUM 145 145 144   POTASSIUM 3.3* 3.4* 3.4*   CHLORIDE 108 109 108  CO2 25 23 27    BUN 55* 48* 39*   CREATININE 1.72* 1.45* 1.22*   CALCIUM 10.4* 11.2* 10.7*   ALBUMIN 2.0*  --   --    MAGNESIUM 2.0 1.9 1.8   PHOSPHORUS 3.0 1.9* 2.0*      Liver Enzymes   Recent Labs     02/15/23  0415   TOTALPROTEIN 5.0*   ALBUMIN 2.0*   AST 22   ALT 18   ALKPHOS 60      Inflammatory Markers   No results for input(s): "CRP", "ESR", "LACTICACID" in the last 72 hours.     PROCALCITONIN   Cardiac and Coags   No results for input(s): "UHCEASTTROPI", "BNP", "INR" in the last 72 hours.    Invalid input(s): "PTT", "PT"   Lipid Panel   No results for input(s): "HDL" in the last 72 hours.    Invalid input(s): "LDL", "CHOL"                     Assessment & Plan     Active Hospital Problems    Diagnosis    Primary Problem: Elevated troponin    FTT (failure to thrive) in adult    Generalized weakness    Acute kidney injury (CMS HCC)    Stage 3b chronic kidney disease (CMS HCC)    Hyperlipidemia    HTN (hypertension)    Rheumatoid arthritis (CMS HCC)    PUD (peptic ulcer disease)    Paroxysmal atrial fibrillation (CMS HCC)       Generalized Weakness / Fall / Failure to Thrive  -Patient presented to outside facility due to progressive generalized weakness and fall  -Likely secondary to chronic deconditioning, malnutrition, and poor PO intake   -PT/OT/Speech therapy ordered  -Fall precautions in place   -Neurology consulted, recommended mirtazapine to increased appetite and sleep    Electrolyte Derangements   -Likely secondary to poor PO intake   -Continue to monitor and replace as necessary     Community Acquired  Pneumonia (Suspected Bacterial, Unknown Organism)- Treated    Acute Hypoxic Respiratory Failure- Resolved   -Patient spiked fever of 37.8 overnight on 02/10/23  -Procalcitonin 0.43  -Respiratory viral panel was negative   -Streptococcus pneumonia antigen, legionella antigen, and MRSA screen negative  -CXR showed bilateral multifocal pneumonia  -CT chest revealed ground glass interstitial densities   -Complete five days of Doxycycline and Rocephin   -Weaned off supplemental oxygen (does not require supplemental oxygen at baseline)    Acute Metabolic Encephalopathy  -Likely secondary to elevated calcium levels, prolonged hospital stay/delirium, or infection  -UA was not suggestive of infection  -Chest xray revealed pneumonia for which patient completed antibiotics   -Strict delirium precautions in place.    Hypercalcemia  -Potential etiologies include mediation vs malignancy (Calcitrol vs AKI)  -Corrected calcium consistently above 12.0   -PTH low at 7.1  -PTHrP in progress  -Monoclonal gammopathy showed no M protein and normal kappa/lambda ratio   -CT brain/chest/abdomen/pelvis revealed no evidence of mass or adenopathy   -S/p 3 doses of Calcitonin and 1 dose of Zoledronic acid   -Home medication- Calcitriol 0.69mcg three times weekly- HELD      Acute Kidney Injury on Chronic Kidney Disease Stage IIIB- Resolved   -Pre-renal likely secondary to poor PO intake  -Creatinine 2.36 on presentation to outside facility (baseline approximately 1.1-1.2), now back to baseline   -US kidney showed no evidence of obstruction   -Avoid nephrotoxic agents  Elevated Troponin  -Likely secondary to acute kidney injury   -Patient reports no chest pain   -Troponins peaked at 42.4   -EKG showed an atrial paced rhythm with a non-specific T wave abnormality   -Continuous telemetry      Paroxysmal Atrial Fibrillation   -Currently rate controlled  -Continue Amiodarone 200mg  daily and Toprol XL 100mg  daily   -Continuous telemetry      Protein  Calorie Malnutrition   -Patient meets criteria based on the following clinical characteristics:   Malnutrition (Undernutrition) Diagnosis  Malnutrition Chronicity: Chronic illness  Malnutrition Severity: Severe protein calorie malnutrition  Related to: Chronic illness  Energy Intake: Less than 75% of EER in greater than or equal to 1 month  Body Fat Loss: Severe  Muscle Mass Loss: Severe  -Nutrition consulted  Nutrition Treatment Plan: Initiate oral supplement, Initiate vitamin/mineral supplement(s), Monitor weight trends  -MNT protocol ordered  -Monitor bowel functions  -Continue Megace 800mg  daily   Lab Results   Component Value Date    ALBUMIN 2.0 (L) 02/15/2023     Hypertension   -Remains hypertensive   -Continue Toprol XL 100mg  daily and Amlodipine increased to 10mg  daily   -Increased Losartan to home dose of 50mg  BID      Hyperlipidemia   -Continue Lipitor 20mg  daily     Rheumatoid Arthritis   -Continue Methotrexate 10mg  weekly      Peptic Ulcer Disease   -Continue Protonix 40mg  daily       Nutrition: DIETARY ORAL SUPPLEMENTS Product Name: Magic Cup - Vanilla; Frequency: LUNCH/DINNER; Number of Containers: 1 Each  DIET CARDIAC Consistency/thickening: PUREE; Instructional: POSITION AS UPRIGHT AS POSSIBLE (BETWEEN 45-90 DEGREES) UNLESS CONTRAINDICATED   DVT PPx: Heparin 5,000U SQ TID  Code Status: No CPR      Isaias Sakai, DO, PGY-III  Case discussed with attending physician.     I have independently seen and examined the patient on the date of service.  I have discussed the history, presentation, and plan of care in detail with the resident .  I have reviewed the document above for medical content and agree as documented.  Not all grammatical errors, misspellings, transcription misinterpretations, and punctuation errors have been corrected.     Exam:  Cardiovascular: Agree as documented  Respiratory: Agree as documented  Leonard Downing MD

## 2023-02-18 ENCOUNTER — Other Ambulatory Visit: Payer: Self-pay

## 2023-02-18 LAB — CBC WITH DIFF
BASOPHIL #: <0.1 10*3/uL (ref <=0.20)
BASOPHIL %: 0.2 %
EOSINOPHIL #: 0.25 10*3/uL (ref <=0.50)
EOSINOPHIL %: 4.2 %
HCT: 29.5 % — ABNORMAL LOW (ref 34.8–46.0)
HGB: 9.5 g/dL — ABNORMAL LOW (ref 11.5–16.0)
IMMATURE GRANULOCYTE #: <0.1 10*3/uL (ref <0.10)
IMMATURE GRANULOCYTE %: 1 % (ref 0.0–1.0)
LYMPHOCYTE #: 0.8 10*3/uL — ABNORMAL LOW (ref 1.00–4.80)
LYMPHOCYTE %: 13.5 %
MCH: 30 pg (ref 26.0–32.0)
MCHC: 32.2 g/dL (ref 31.0–35.5)
MCV: 93.1 fL (ref 78.0–100.0)
MONOCYTE #: 0.16 10*3/uL — ABNORMAL LOW (ref 0.20–1.10)
MONOCYTE %: 2.7 %
MPV: 11.2 fL (ref 8.7–12.5)
NEUTROPHIL #: 4.63 10*3/uL (ref 1.50–7.70)
NEUTROPHIL %: 78.4 %
PLATELETS: 212 10*3/uL (ref 150–400)
RBC: 3.17 10*6/uL — ABNORMAL LOW (ref 3.85–5.22)
RDW-CV: 17.8 % — ABNORMAL HIGH (ref 11.5–15.5)
WBC: 5.9 10*3/uL (ref 3.7–11.0)

## 2023-02-18 LAB — PHOSPHORUS: PHOSPHORUS: 3.6 mg/dL (ref 2.3–4.0)

## 2023-02-18 LAB — COMPREHENSIVE METABOLIC PANEL, NON-FASTING
ALBUMIN: 2.3 g/dL — ABNORMAL LOW (ref 3.4–4.8)
ALKALINE PHOSPHATASE: 62 U/L (ref 55–145)
ALT (SGPT): 30 U/L — ABNORMAL HIGH (ref 8–22)
ANION GAP: 9 mmol/L (ref 4–13)
AST (SGOT): 52 U/L — ABNORMAL HIGH (ref 8–45)
BILIRUBIN TOTAL: 0.5 mg/dL (ref 0.3–1.3)
BUN/CREA RATIO: 23 — ABNORMAL HIGH (ref 6–22)
BUN: 35 mg/dL — ABNORMAL HIGH (ref 8–25)
CALCIUM: 10.1 mg/dL (ref 8.6–10.3)
CHLORIDE: 113 mmol/L — ABNORMAL HIGH (ref 96–111)
CO2 TOTAL: 26 mmol/L (ref 23–31)
CREATININE: 1.55 mg/dL — ABNORMAL HIGH (ref 0.60–1.05)
ESTIMATED GFR - FEMALE: 33 mL/min/BSA — ABNORMAL LOW (ref >=60)
GLUCOSE: 107 mg/dL (ref 65–125)
POTASSIUM: 3.8 mmol/L (ref 3.5–5.1)
PROTEIN TOTAL: 5.1 g/dL — ABNORMAL LOW (ref 6.0–8.0)
SODIUM: 148 mmol/L — ABNORMAL HIGH (ref 136–145)

## 2023-02-18 LAB — MAGNESIUM: MAGNESIUM: 1.8 mg/dL (ref 1.8–2.6)

## 2023-02-18 MED ORDER — POTASSIUM BICARBONATE-CITRIC ACID 20 MEQ EFFERVESCENT TABLET
40.0000 meq | EFFERVESCENT_TABLET | ORAL | Status: AC
Start: 2023-02-18 — End: 2023-02-18
  Administered 2023-02-18: 40 meq via ORAL
  Filled 2023-02-18: qty 2

## 2023-02-18 MED ORDER — MIRTAZAPINE 7.5 MG TABLET
7.5000 mg | ORAL_TABLET | Freq: Every evening | ORAL | 0 refills | Status: AC
Start: 2023-02-18 — End: ?

## 2023-02-18 MED ORDER — MEGESTROL 400 MG/10 ML (10 ML) ORAL SUSPENSION
800.0000 mg | Freq: Every day | ORAL | 0 refills | Status: AC
Start: 2023-02-19 — End: ?

## 2023-02-18 NOTE — Care Plan (Signed)
[facility]  Rehabilitation Services  Physical Therapy Progress Note      Patient Name: Theresa Greene  Date of Birth: 1935-07-06  Height:  160 cm (5\' 3" )  Weight:  53.1 kg (117 lb)  Room/Bed: 328/A  Payor: MEDICARE / Plan: MEDICARE PART A AND B / Product Type: Medicare /     Assessment:        02/18/23 1045   Rehab Session   Document Type therapy progress note (daily note)   PT Visit Date 02/18/23   Total PT Minutes: 23   Patient Effort good   Basic Mobility Am-PAC/6Clicks Score (APPROVED Staff)   Turning in bed without bedrails 3   Lying on back to sitting on edge of flat bed 3   Moving to and from a bed to a chair 3   Standing up from chair 3   Walk in room 3   Climbing 3-5 steps with railing 1   6 Clicks Raw Score total 16   Standardized (t-scale) score 38.32     Pt. With improved mobility, alertness, speech this date. Continued intermittent confusion. Able to ambulate up to 63ft with WW and CGA with chair follow. C/o back pain this date.      D/C Disposition:    SNF    Plan:   Continue to follow patient according to established plan of care.  The risks/benefits of therapy have been discussed with the patient/caregiver and he/she is in agreement with the established plan of care.     Subjective & Objective:     Pt. Cleared for treatment by RN. Pt. Agreeable to participate. Co-treat with OT to maximize pt. Participation, safety and functional mobility. Pt. Transferred supine to sit with min A x2. Sitting EOB with improved alertness to acclimate to upritght. Sit to/from stand min A x2 with VC with improved upright posutre. Gait training with min A x2 with chair follow 70ft and 30 ft with fatigue and back pain noted. Pt. On room air throughout session. Pt. Left in recliner with posey alarm, call light in reach.     Therapist:   Salome Spotted, MPT  [id number]    Start Time: 1021  End Time: 1044  PM Treatment Time: 23 minutes    Total Treatment Time: 23 minutes  Charges Entered: TA, GT  Department  Number: [id number]

## 2023-02-18 NOTE — Care Plan (Signed)
Pt awake.a/o x 4 presently.Oncology r/a and no sob. Cont p ox reads 94 percent at this time  Problem: Adult Inpatient Plan of Care  Goal: Plan of Care Review  Outcome: Ongoing (see interventions/notes)  Goal: Patient-Specific Goal (Individualized)  Outcome: Ongoing (see interventions/notes)  Flowsheets (Taken 02/17/2023 2200)  Patient would like to participate in bedside shift report: Yes  Plan of Care Reviewed With: patient  Goal: Absence of Hospital-Acquired Illness or Injury  Outcome: Ongoing (see interventions/notes)  Intervention: Identify and Manage Fall Risk  Recent Flowsheet Documentation  Taken 02/17/2023 2200 by Thomas Hoff, RN  Safety Promotion/Fall Prevention:   activity supervised   safety round/check completed   fall prevention program maintained  Intervention: Prevent Skin Injury  Recent Flowsheet Documentation  Taken 02/17/2023 2200 by Thomas Hoff, RN  Body Position: supine, head elevated  Skin Protection: adhesive use limited  Intervention: Prevent and Manage VTE (Venous Thromboembolism) Risk  Recent Flowsheet Documentation  Taken 02/17/2023 2200 by Thomas Hoff, RN  VTE Prevention/Management: anticoagulant therapy maintained  Intervention: Prevent Infection  Recent Flowsheet Documentation  Taken 02/17/2023 2200 by Thomas Hoff, RN  Infection Prevention: barrier precautions utilized  Goal: Optimal Comfort and Wellbeing  Outcome: Ongoing (see interventions/notes)  Intervention: Provide Person-Centered Care  Recent Flowsheet Documentation  Taken 02/17/2023 2200 by Thomas Hoff, RN  Trust Relationship/Rapport:   care explained   questions answered   questions encouraged  Goal: Rounds/Family Conference  Outcome: Ongoing (see interventions/notes)     Problem: Health Knowledge, Opportunity to Enhance (Adult,Obstetrics,Pediatric)  Goal: Knowledgeable about Health Subject/Topic  Description: Patient will demonstrate the desired outcomes by discharge/transition of care.  Outcome: Ongoing (see  interventions/notes)  Intervention: Enhance Health Knowledge  Recent Flowsheet Documentation  Taken 02/17/2023 2200 by Thomas Hoff, RN  Family/Support System Care: self-care encouraged  Supportive Measures: active listening utilized  Intervention: Enhance Health Knowledge  Recent Flowsheet Documentation  Taken 02/17/2023 2200 by Thomas Hoff, RN  Family/Support System Care: self-care encouraged  Supportive Measures: active listening utilized     Problem: Skin Injury Risk Increased  Goal: Skin Health and Integrity  Outcome: Ongoing (see interventions/notes)  Intervention: Optimize Skin Protection  Recent Flowsheet Documentation  Taken 02/17/2023 2200 by Thomas Hoff, RN  Pressure Reduction Techniques:   Heels elevated off of the bed   Patient turned q 2 hours  Pressure Reduction Devices:   Heel offloading device utilized   Repositioning wedges/pillows utilized  Skin Protection: adhesive use limited  Activity Management: bedrest  Head of Bed (HOB) Positioning: 30 degrees     Problem: Oral Intake Inadequate  Goal: Improved Oral Intake  Outcome: Ongoing (see interventions/notes)     Problem: Malnutrition  Goal: Improved Nutritional Intake  Outcome: Ongoing (see interventions/notes)

## 2023-02-18 NOTE — Discharge Summary (Signed)
[facility]  DISCHARGE SUMMARY      PATIENT NAME:  Jajaira, Bohlman  MRN:  [id number]  DOB:  May 25, 1935    ENCOUNTER DATE:  02/07/2023  INPATIENT ADMISSION DATE: 02/08/2023  DISCHARGE DATE:  02/18/2023    ATTENDING PHYSICIAN: Excell Seltzer, MD  SERVICE: CCM HOSPITALIST  PRIMARY CARE PHYSICIAN: [facility], DO     Reason for Admission       Diagnosis        Elevated troponin [[id number]]            DISCHARGE DIAGNOSIS:  Hypercalcemia    Principal Problem:  Elevated troponin    Active Hospital Problems    Diagnosis Date Noted    Principal Problem: Elevated troponin [R79.89] 02/07/2023    FTT (failure to thrive) in adult [R62.7] 02/14/2023    Generalized weakness [R53.1] 02/07/2023    Acute kidney injury (CMS HCC) [N17.9] 02/07/2023    Stage 3b chronic kidney disease (CMS HCC) [N18.32] 12/25/2019    Hyperlipidemia [E78.5] 12/25/2019    HTN (hypertension) [I10] 01/13/2015    Rheumatoid arthritis (CMS HCC) [M06.9] 08/06/2014    PUD (peptic ulcer disease) [K27.9] 08/06/2014    Paroxysmal atrial fibrillation (CMS HCC) [I48.0] 08/06/2014      Resolved Hospital Problems   No resolved problems to display.     Active Non-Hospital Problems    Diagnosis Date Noted    DNR (do not resuscitate) 01/25/2023    DNI (do not intubate) 01/25/2023    Secondary hyperparathyroidism of renal origin (CMS HCC) 01/25/2023    Exudative age-related macular degeneration, right eye, with inactive choroidal neovascularization (CMS HCC) 09/18/2022    Recurrent UTI (urinary tract infection) 05/07/2022    Anxiety 05/07/2022    Hyperphosphatemia 05/07/2022    Pancreatic insufficiency 02/02/2022    Frailty 02/01/2022    Frequent falls 02/01/2022    Moderate protein-calorie malnutrition (CMS HCC) 07/20/2021    Osteoporosis 08/25/2020    Iron deficiency anemia 07/19/2020    Neuropathy (CMS HCC) 04/05/2020    Vitamin D deficiency 12/25/2019    History of CVA (cerebrovascular accident) 08/06/2014    History of cardiac pacemaker  08/06/2014    Gastroesophageal reflux disease 08/06/2014    Charcot's joint of foot, left 08/06/2014    CAD S/P percutaneous coronary angioplasty 08/06/2014      Allergies   Allergen Reactions    Iodinated Contrast Media Anaphylaxis    Iv Contrast Anaphylaxis     makese her serverly sick    Etodolac NO Steroids unless approved by Attending Physician    Symbicort [Budesonide-Formoterol]     Codeine  Other Adverse Reaction (Add comment) and Nausea/ Vomiting     Stomach pain  Stomach pain              DISCHARGE MEDICATIONS:     Current Discharge Medication List        START taking these medications.        Details   megestroL 400 mg/10 mL (10 mL) Suspension  Commonly known as: MEGACE  Start taking on: February 19, 2023   800 mg, Oral, DAILY  Qty: 600 mL  Refills: 0     Mirtazapine 7.5 mg Tablet  Commonly known as: REMERON   7.5 mg, Oral, NIGHTLY  Qty: 30 Tablet  Refills: 0            CONTINUE these medications - NO CHANGES were made during your visit.        Details  acetaminophen 650 mg Tablet Sustained Release  Commonly known as: TYLENOL ARTHRITIS PAIN   1,300 mg, Oral, 2 TIMES DAILY PRN  Refills: 0     amiodarone 200 mg Tablet  Commonly known as: PACERONE   200 mg, Oral, DAILY  Qty: 90 Tablet  Refills: 3     amLODIPine 5 mg Tablet  Commonly known as: NORVASC   5 mg, Oral, DAILY  Refills: 0     aspirin 81 mg Tablet, Chewable   81 mg, Oral, DAILY  Refills: 0     atorvastatin 20 mg Tablet  Commonly known as: LIPITOR   20 mg, Oral, DAILY  Qty: 90 Tablet  Refills: 0     Dexlansoprazole 60 mg Cap, Delayed Rel., Multiphasic  Commonly known as: DEXILANT   60 mg, Oral, DAILY  Qty: 90 Capsule  Refills: 1     ergocalciferol (vitamin D2) 1,250 mcg (50,000 unit) Capsule  Commonly known as: DRISDOL   50,000 Units, Oral, EVERY MO AND FR, Takes on Fridays  Qty: 96 Capsule  Refills: 0     folic acid 1 mg Tablet  Commonly known as: FOLVITE   1 mg, Oral, DAILY  Qty: 90 Tablet  Refills: 0     gabapentin 100 mg Capsule  Commonly known as:  NEURONTIN   100 mg, Oral, 2 TIMES DAILY  Qty: 180 Capsule  Refills: 0     lipase-protease-amylase 5,000-17,000- 24,000 unit Capsule, Delayed Release(E.C.)   1 Capsule, Oral, 3 TIMES DAILY WITH MEALS  Qty: 270 Capsule  Refills: 3     losartan 50 mg Tablet  Commonly known as: COZAAR   50 mg, Oral, 2 TIMES DAILY  Qty: 180 Tablet  Refills: 1     LYSINE ORAL   100 mg, Oral, DAILY PRN  Refills: 0     melatonin 10 mg Tablet   10 mg, Oral, NIGHTLY  Refills: 0     methotrexate 2.5 mg tablet   10 mg, Oral, EVERY 7 DAYS  Qty: 48 Tablet  Refills: 0     metoprolol succinate 100 mg Tablet Sustained Release 24 hr  Commonly known as: TOPROL-XL   100 mg, Oral, DAILY  Qty: 90 Tablet  Refills: 0     Myrbetriq 50 mg Tablet Sustained Release 24 hr  Generic drug: mirabegron   TAKE ONE CAPSULE IN THE MORNING  Qty: 90 Each  Refills: 3     nitroGLYCERIN 0.4 mg Tablet, Sublingual  Commonly known as: NITROSTAT   0.4 mg, Sublingual, EVERY 5 MIN PRN, for 3 doses over 15 minutes  Qty: 25 Tablet  Refills: 5     sodium bicarbonate 650 mg Tablet   650 mg, Oral, 2 TIMES DAILY  Qty: 180 Tablet  Refills: 3            STOP taking these medications.      calcitrioL 0.5 mcg Capsule  Commonly known as: ROCALTROL     cephalexin 250 mg Capsule  Commonly known as: KEFLEX     sertraline 25 mg Tablet  Commonly known as: ZOLOFT            Discharge med list refreshed?  YES        DISCHARGE INSTRUCTIONS:  Post-Discharge Follow Up Appointments       Follow up with Georga Hacking, MD    Phone: [phone]    Where: [facility]    Schedule an appointment with Hill-Reinert, Arden, DO as soon as possible for a  visit in 1 week(s)    Phone: [phone]    Where: [facility]      Thursday Feb 21, 2023    REMOTE DEVICE CK with Ccm, Home Device Clinic at  1:45 AM      Tuesday Apr 09, 2023    Return Patient Visit with Clemon Chambers, MD at 11:00 AM      Friday Apr 26, 2023    Return Patient Visit with Hill-Reinert, Savannah, DO at  2:00 PM       Wednesday May 29, 2023    Return Patient Visit with Dorene Sorrow, PA-C at 11:00 AM    DEVICE CHECK with Clinic-Ccm, Device at 12:30 PM      Wednesday Nov 06, 2023    Return Patient Visit with Lenox Ahr at  1:30 PM      Cardiology, Medical Office Building D  Medical Office Building D, [place]  [address]  [place] [address]  [phone] Cardiology, Medical Office Building D  Medical Office Building D, Astrid Divine  [address]  [place] [address]  [phone] Orthopedics, Surgical Associates  [facility], [facility]  [address]  [place] [address]  [phone]    Pediatrics/Internal Medicine, [facility]  [facility], [place]   [address]  [place] [address]  [phone] Urology, [facility], [place]  [address]  [place] [address]  [phone]             COMPREHENSIVE METABOLIC PANEL, NON-FASTING    Please send results to PCP     Release to patient Automated [100001]      MAGNESIUM    Please send results to PCP     Release to patient Automated [100001]      PHOSPHORUS    Please send results to PCP     Release to patient Automated [100001]      DISCHARGE INSTRUCTION - MISC    You are being discharged to a skilled nursing facility for further rehabilitation and two new medications, Remeron and Megace to help with appetite stimulation and promote restful sleep. You will STOP taking Calcitriol but will continue your other home medications as previously prescribed. Repeat lab work was ordered for one week. Please follow-up with your PCP within 1 week of being discharged. Please report to the emergency department with new or worsening symptoms.          REASON FOR HOSPITALIZATION AND HOSPITAL COURSE:  Callee "Windell Moulding" Signor is an 88 year old female with a past medical history of paroxysmal atrial  fibrillation, hypertension, hyperlipidemia, rheumatoid arthritis, CKD stage IIIB who presented as a transfer from [facility]. The patient initially presented to [facility] for generalized weakness. She stated that over the last two weeks she has had progressive weakness to the point that she required help to go to the restroom. She also admitted to falling in her bathroom on to the tile floor. She denied any injuries from her fall. At baseline she is typically able to ambulate with her walker. She also admitted to decreased oral intake over this same time period. The patient noted that she was diagnosed with COVID-19 in 2020 and has lost about 50lbs since this time due to decreased appetite and attributes that as a cause of her generalized weakness. She denied smoking history and stated that she is up to date on her cancer  screenings. She denied any chest pain, shortness of breath, nausea, vomiting, or diarrhea. She was hypertensive on arrival to the ED at 134/8mmHg but was otherwise hemodynamically stable. Labs at the outside facility were significant for creatinine 2.36 (baseline approximately 1.1-1.2) and mildly elevated troponins at 31.2 --> 32.3. CXR showed no acute cardiopulmonary process. The patient was subsequently transferred to [facility] as [facility] did not have any beds available. She was admitted to the academic medicine team for further evaluation and management. Patient's renal function improved with fluid resuscitation. She was noted to have hypercalcemia on her labs. Monoclonal gammopathy work-up was sent which revealed no M protein and a normal kappa/lambda ratio. CT brain/chest/abdomen/pelvis showed no evidence of mass or adenopathy. She received 3 doses of Calcitonin and 1 dose of Zoledronic acid. CXR revealed bilateral multifocal pneumonia and CT chest showed ground glass interstitial densities for which she received five days of Rocephin and Doxycycline. Patient exhibited intermittent confusion  and progressive weakness during hospitalization, neurology was consulted who recommended addition of Mirtazapine to increase appetite and improve sleep. Palliative care was consulted and patient/family do not want aggressive treatment.  She is being discharged to [facility].  She will be continuing Remeron and Megace, and her home meds as previously prescribed. She has been advised to stop taking calcitriol and to come to the ED with new or worsening symptoms.  She was instructed to follow up with her PCP within 1 week of discharge.  Repeat CMP, Mag, phos are ordered for 1 week and results are being sent to PCP.    Physical Exam  Constitutional: Patient appears stated age.  Eyes: No scleral icterus.   Neck: Neck supple.   Cardiovascular: Normal rate, regular rhythm and normal heart sounds.    Pulmonary/Chest: Effort normal and breath sounds normal. No wheezes. Has no rhonchi. Has no rales.   Abdominal: Soft. Bowel sounds are normal. No distension. There is no tenderness.   Musculoskeletal: Exhibits no edema, tenderness or deformity.   Lymphadenopathy: No enlarged lymph nodes or tenderness present.  Skin: Skin is warm and dry. No rash noted. No erythema.           CONDITION ON DISCHARGE:  A. Ambulation: Up with assistance only  B. Self-care Ability: With full assistance  C. Cognitive Status Alert  D. DNR status at discharge: No CPR  E. Lace + Score: 75 (02/18/23 0532)       Advance Directive Information      Flowsheet Row Most Recent Value   Does the Patient have an Advance Directive? Yes, Patient Does Have Advance Directive for Healthcare Treatment            DISCHARGE DISPOSITION:    DISCHARGE PATIENT   Ordered at: 02/18/23 1532     Is there a planned readmission to acute care within 30 days?    No     Disposition:    [facility], MD, PGY-1    Time spent on discharge greater than 30 minutes? Yes      Copies sent to Care Team         Relationship Specialty  Notifications Start End    Dodson, Loco, Sultan PCP - General INTERNAL MEDICINE Admissions 04/05/20     Phone: [phone] Fax: [phone]         [address] [place] [address]    Marlene Bast,  Balinda Quails, MD PCP - Cardiologist CARDIOVASCULAR DISEASE Admissions 05/25/22     Phone: [phone] Fax: [phone]         [address] [place] [address]            Referring providers can utilize [email or web address] to access their referred [facility] patient's information.           I saw and examined the patient.  I reviewed the resident's note.  I agree with the findings and plan of care as documented in the resident's note.  Any exceptions/additions are edited/noted.    Excell Seltzer, MD 02/19/2023, 09:21

## 2023-02-18 NOTE — Care Plan (Signed)
[facility]  Rehabilitation Services  Occupational Therapy Progress Note    Patient Name: Theresa Greene  Date of Birth: August 25, 1934  Height:  160 cm (5\' 3" )  Weight:  53.1 kg (117 lb)  Room/Bed: 328/A  Payor: MEDICARE / Plan: MEDICARE PART A AND B / Product Type: Medicare /     Assessment:    Pt tolerated treatment better this date, ambulated x 2 bouts w/ FWW and MIN A. However, pt continues to be limited by weakness, imparied balance, impaired activity tolerance and confusion. Pt may benefit from continued OT services to improve IND/safety w/ ADLs, functional mobility, and functional transfers.     Discharge Disposition: SNF, TBD       02/18/23 1044   Therapist Pager   OT Assigned/ Pager # [phone]   Rehab Session   Document Type therapy progress note (daily note)   Total OT Minutes: 23   Daily Activity AM-PAC/6-clicks Score   Putting on/Taking off clothing on lower body 3   Bathing 3   Toileting 3   Putting on/Taking off clothing on upper body 4   Personal grooming 4   Eating Meals 4   Raw Score Total 21   Standardized (t-scale) Score 44.27   CMS 0-100% Score 32.79   CMS Modifier CJ           Plan:   Continue to follow patient according to established plan of care.  The risks/benefits of therapy have been discussed with the patient/caregiver and he/she is in agreement with the established plan of care.     Subjective & Objective:     RN cleared pt for OT session. Pt was supine in bed, co-tx w/ PT to maximize safety/performance. PCT just finished assisting w/ personal care. Pt was more alert this date, but remained somewhat confused. Pt knew she was in a hospital but was unsure which town and name of hospital. Pt was able to recall month and year. Pt t/f supine to EOB w/ MIN A x 2, stood w/ MIN A x 2 using FWW for support. Pt ambulated x 2 bouts, 20 feet and 30 feet w/ MIN A x 1 using FWW for support, w/ chair follow provided. Cues provided for safety. Pt was left seated at [facility] chair, call button  within reach and chair alarm set for safety. All lines intact and needs met at this time.     Therapist:   Celesta Gentile, MOTR/L     Start Time: 1021  End Time: 1044  Total Treatment Time: 23 minutes  Charges Entered: 1 OT ADL, 1 OT TA  Department Number: [id number]

## 2023-02-18 NOTE — Nurses Notes (Signed)
02/18/2023 @ 1738    Called [place] to give report on patient; phone kept ringing and could not get through to talk to someone. Will try again shortly.

## 2023-02-18 NOTE — Nurses Notes (Signed)
02/18/2023 @ 1845    Called [place] again to try and give report on this patient. Unable to get though to a person to give report. EMS is here to pick up patient for transport to [place]. IV removed, catheter intact.

## 2023-02-18 NOTE — Care Management Notes (Signed)
Returned call to patients daughter, [name]. I updated her on plan to discharge to [facility] today. She had questions regarding Medicaid and caregiver/homemaker resources for home. I also discussed these with her sister, Vikki Ports, during an earlier conversation last week and had sent an e-mail with resources for her to review. I forwarded the e-mail of resources to [facility]. No further questions and she is agreeable to discharge today. Patients husband should be called when transportation is arranged.     Larita Fife, CASE MANAGER

## 2023-02-18 NOTE — Nurses Notes (Signed)
02/18/2023 @ 1750    Tried [employer] [facility] to give report on patient, sent to voicemail and left message to call back.

## 2023-02-18 NOTE — Care Management Notes (Signed)
Patient is medically ready for discharge today. [facility] does still have a bed for her and can accept today. Treatment team notified via secure Epic chat.   Joseph Art, BSW

## 2023-02-18 NOTE — Nurses Notes (Signed)
02/18/2023 @ 0720    Pt shift change report received. Pt is a&ox2-3 (intermittent confusion), on RA, on bedrest. Respirations are even, and unlabored. Pt is reports pain as 0/10 currently. IV is clear dry and intact. Bed is in low position, wheels are locked. Pt is on a cardiac diet, pureed, thin liquids, to sit upright as possible. Pt is Q4 vitals. Pt is on skin and fall precautions. Pt has PT/OT and speech ordered. Pt reports no needs or concerns at this time.

## 2023-02-18 NOTE — Consults (Addendum)
[facility]  Palliative Care Consult  Follow Up Note    Theresa Greene, Theresa Greene, 88 y.o. female  Date of Service: 02/18/2023  Date of Birth:  05/16/1935    Hospital Day:  LOS: 10 days     Assessment/Recommendations:   See below assessment, continue current treatment. Current goal is SNF placement for rehab.    POST completed:  POST Form discussed this visit. Will f/u at a subsequent visit and complete if desired by the family.    Subjective:     Palliative Care is following for communication with the patient or family and goal clarification.  Received an update from the care team today.  The attending providers have recommended hospice.  Received an update from [place], Charity fundraiser and [place], RN CM today.  The patient is resting with her eyes closed at this time in the recliner chair.  Will reach out to her family via telephone call.    Called the patient's family including the patient's spouse/MPOA, [name], and daughter, [name].  Initially discussed the goals of care with [name] and then added [name] to the call to discuss with her as well as requested by [name].  Goals of care were reviewed in detail.  DC options discussed include SNF placement for rehab, SNF placement with hospice (room and board would be out of pocket unless eligible for Medicaid), or ALF placement/home with hospice services. [name] wishes for the goal to be SNF for rehab when the patient is medically ready. He states this will give them time to see how the patient will respond to therapy.  [name] and [name] are aware hospice is an option after rehab has finished. Reviewed hospice services and goals of care. Explained hospice and rehab cannot be done concurrently. [name] states she has actually spoke with [facility] before (as they cover [facility]) about their services. [name] would like a list of caregiver agency options so the family can start planning for needs after DC from rehab. Her e-mail is [email or web address], asked the RN CM to send the  caregiver list to [facility] as requested. [name] does have questions and said she would like for the RN CM to call her, notified Morrie Sheldon, RN CM of this. The patient's spouse is anticipating DC in the next 1-2 days. If the patient is still in the hospital tomorrow, we may complete a POST Form.  Reviewed the use of a POST form with the family.  Will notify staff that the patient's orange DNR Order will need to go with the patient at DC.  Answered the family's questions and offered support. They were appreciative of the call.    Updated the care team on the above discussion.  The current discharge goal is SNF placement for rehab.  After rehab, the family will discuss the goals of care from there.    Patient/Family Meeting held, present were: spouse and daughter via telephone call    Discussion with patient and family included: current problems, hospice, pt/family goals for care, and POST Form    Location of pt/family meeting:   telephone    Consultant:  Evon Slack, BSN, RN, [facility]  Total Time of Encounter:  25 minutes  Time in Counseling as above:  25 minutes  Start time:  14:30  Stop Time:  14:55

## 2023-02-19 ENCOUNTER — Telehealth (INDEPENDENT_AMBULATORY_CARE_PROVIDER_SITE_OTHER): Payer: Self-pay | Admitting: Internal Medicine

## 2023-02-19 NOTE — Telephone Encounter (Addendum)
Transition of Care Contact Information  Discharge Date: 02/18/2023  Transition Facility Type--Hospital (Inpatient or Observation)  Facility Name--[facility]  Interactive Contact(s): Completed or attempted contact indicated by Date/Time  First Attempt Call: 02/19/2023  1:11 PM  Contact Method(s)-- Patient/Caregiver Telephone  Clinical Staff Name/Role who contacted--Bridgett Hattabaugh  Transition Note:Left message with the caregiver to return call, patient is currently in the nursing home at [place].    Unable to Complete TCM due to:  Further information may be documented in relevant telephone or outreach encounter.        Transition of Care Contact Information  Discharge Date: 02/18/2023  Transition Facility Type--Hospital (Inpatient or Observation)  Facility Name-- [facility]  Interactive Contact(s): Completed or attempted contact indicated by Date/Time  Completed Contact: 02/19/2023  4:26 PM  First Attempt Call: 02/19/2023  1:11 PM  Contact Method(s)-- Patient/Caregiver Telephone  Clinical Staff Name/Role who contacted--Layni Kreamer  Transition Assessment  Discharge Summary obtained?--Yes  How are you recovering?--Not Any Better  Discharge Meds obtained?--No  Medication Barriers  --Patient has not been to pharmacy yet--   Discharge medication changes reviewed?--No  Full Medication Reconciliation Completed?  Medication understanding --no new discharge meds prescribed  Medication Concerns?--No  Have everything needed for recovery?--Yes  Care Coordination:   Patient has transition follow-up appointment date and time?--Yes  Primary Care Transition Visit planned?--Yes  Specialist Transition Visit planned?--Yes  Patient/caregiver plans to attend transition visit?--Yes  Primary Follow-up Barrier  Interventions provided --follow-up appointment made  Home Health or DME ordered at discharge?--No  Clinician/Team notified?--No  Primary reason clinician notified?  Transition Note:Patient is now down to  104 lbs and facility if recommending hospice care.    Unable to Complete TCM due to:  Further information may be documented in relevant telephone or outreach encounter.  Dory Peru, MA

## 2023-02-19 NOTE — Care Management Notes (Signed)
Referral Information  ++++++ Placed Provider #1 ++++++  Case Manager: Cari Matheny  Provider Type: Nursing Home/SNF  Provider Name: [facility]  Address:  [address]  [place], [place] [address]  Contact: Rhonda inactiveMurrey    Phone: [phone] x  Fax:   Fax: [phone]

## 2023-02-20 ENCOUNTER — Encounter (INDEPENDENT_AMBULATORY_CARE_PROVIDER_SITE_OTHER): Payer: Self-pay | Admitting: Internal Medicine

## 2023-02-20 ENCOUNTER — Ambulatory Visit: Payer: Medicare Other | Attending: Internal Medicine | Admitting: Internal Medicine

## 2023-02-20 ENCOUNTER — Other Ambulatory Visit: Payer: Self-pay

## 2023-02-20 DIAGNOSIS — R7989 Other specified abnormal findings of blood chemistry: Secondary | ICD-10-CM | POA: Insufficient documentation

## 2023-02-20 DIAGNOSIS — Z09 Encounter for follow-up examination after completed treatment for conditions other than malignant neoplasm: Secondary | ICD-10-CM | POA: Insufficient documentation

## 2023-02-20 DIAGNOSIS — R627 Adult failure to thrive: Secondary | ICD-10-CM | POA: Insufficient documentation

## 2023-02-20 DIAGNOSIS — N179 Acute kidney failure, unspecified: Secondary | ICD-10-CM | POA: Insufficient documentation

## 2023-02-20 NOTE — Progress Notes (Signed)
[facility]  PEDIATRICS/INTERNAL MEDICINE, [place] [facility]  [address]  [place] [address]  [id number]  02/24/2023     Video Visit     Name:  Theresa Greene MRN: [id number]   Date:    02/20/2023 Age:  88 y.o.                                      Patient's location: [place] - [place] [address]   Patient/family aware of provider location: Yes  Patient/family consent for video visit: Yes  Interview and observation performed by: Clydia Llano, DO    Visit Reason:     Chief Complaint   Patient presents with    Hospital Discharge Transition     History:   Past Medical History:    Past Medical History:   Diagnosis Date    Arthritis     Asthma     Back problem     CAD S/P percutaneous coronary angioplasty 2010    Pt reports 5 years ago    Charcot's joint of foot, left 08/06/2014    Colon polyp     CVA (cerebrovascular accident) (CMS HCC) 1995    no residual deficits    Deaf     Diverticulitis of colon     ESBL (extended spectrum beta-lactamase) producing bacteria infection 02/08/2022    ESBL + Klebsiella pneumoniae urine 03/22/22, 02/08/22    Gastroesophageal reflux disease 08/06/2014    GI bleed     H/O hearing loss     History of cardiac pacemaker 08/06/2014    HTN (hypertension)     Hyperlipidemia 12/25/2019    Muscle weakness     Neuropathy (CMS HCC)     Pacemaker     Paroxysmal atrial fibrillation (CMS HCC) 08/06/2014    PUD (peptic ulcer disease) 08/06/2014    C/b GIB Spring 2015    Rheumatoid arthritis (CMS HCC) 08/06/2014    Stage 3 chronic kidney disease (CMS HCC) 12/25/2019    Last Assessment & Plan:  Renal function stable, at baseline Stable electrolytes; restart sodium bicarb 650 mg BID  Continue renal protective ARB Discussed CKD risk factor modifications, including tight BP/glucose control, low protein diet, and avoidance of NSAIDs    Stented coronary artery     Vitamin D deficiency 12/25/2019    Wears glasses        Past Surgical History:     Past Surgical History:   Procedure Laterality Date    ANKLE SURGERY Left     repair with metal    CATARACT EXTRACTION      unsure which eye but thinks she only had one eye done    CORONARY ARTERY ANGIOPLASTY  2011    ESOPHAGOGASTRODUODENOSCOPY      HIP SURGERY Right     repair with metal    HX BACK SURGERY      lower back x 3 no metal    HX COLONOSCOPY      HX HIP REPLACEMENT Left     HX NISSEN FUNDOPLICATION  2002    then three others to repair problems    HX OPEN CHOLECYSTECTOMY  1962    with common duxt exploration    HX PACEMAKER INSERTION  2019    HX SHOULDER SURGERY Bilateral     scopes    KNEE ARTHROPLASTY Bilateral     OPEN  REDUCTION INTERNAL FIXATION FRACTURE FEMUR Right 05/07/2020    Performed by Jeanell Sparrow, MD at [facility] OR [facility]TH    ORIF DISTAL FEMUR FRACTURE Right     OTHER SURGICAL HISTORY Right     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    PAIN SERVICE BLOCK INJECTION LUMBAR EPIDURAL N/A 01/24/2023    Performed by Karl Bales, MD at [facility] OR MAIN    PANCREATIC PSEUDOCYST DRAINAGE  1971    REPAIR ECTROPION Right 09/21/2021    Performed by Pierce Crane, MD at [facility] OR MAIN    REPAIR PTOSIS Left 09/21/2021    Performed by Pierce Crane, MD at [facility] OR MAIN    TOTAL ABDOMINAL HYSTERECTOMY       Family History:    Family Medical History:       Problem Relation (Age of Onset)    Brain cancer Mother    Ovarian Cancer Mother    Pancreatic Cancer Father             Social History:   Social History     Tobacco Use    Smoking status: Never    Smokeless tobacco: Never   Substance Use Topics    Alcohol use: Never      Social History     Substance and Sexual Activity   Drug Use Never      Social History     Social History Narrative    Lives at home with husband. 2 grown daughters - an Charity fundraiser and a Mining engineer. She did Building control surveyor for 23 years.         Allergies:    Allergies   Allergen Reactions    Iodinated Contrast Media Anaphylaxis    Iv Contrast Anaphylaxis     makese her  serverly sick    Etodolac NO Steroids unless approved by Attending Physician    Symbicort [Budesonide-Formoterol]     Codeine  Other Adverse Reaction (Add comment) and Nausea/ Vomiting     Stomach pain  Stomach pain        Home Medications:    Current Outpatient Medications:     acetaminophen (TYLENOL ARTHRITIS PAIN) 650 mg Oral Tablet Sustained Release, Take 2 Tablets (1,300 mg total) by mouth Twice per day as needed for Pain, Disp: , Rfl:     amiodarone (PACERONE) 200 mg Oral Tablet, Take 1 Tablet (200 mg total) by mouth Once a day, Disp: 90 Tablet, Rfl: 3    amLODIPine (NORVASC) 5 mg Oral Tablet, Take 1 Tablet (5 mg total) by mouth Once a day, Disp: , Rfl:     aspirin 81 mg Oral Tablet, Chewable, Chew 1 Tablet (81 mg total) Once a day, Disp: , Rfl:     atorvastatin (LIPITOR) 20 mg Oral Tablet, Take 1 Tablet (20 mg total) by mouth Once a day, Disp: 90 Tablet, Rfl: 0    Dexlansoprazole (DEXILANT) 60 mg Oral Cap, Delayed Rel., Multiphasic, TAKE 1 CAPSULE BY MOUTH ONCE DAILY, Disp: 90 Capsule, Rfl: 1    ergocalciferol, vitamin D2, (DRISDOL) 1,250 mcg (50,000 unit) Oral Capsule, Take 1 Capsule (50,000 Units total) by mouth Every MON and FRI for 336 days Takes on Fridays, Disp: 96 Capsule, Rfl: 0    folic acid (FOLVITE) 1 mg Oral Tablet, Take 1 Tablet (1 mg total) by mouth Once a day for 90 days, Disp: 90 Tablet, Rfl: 0    gabapentin (NEURONTIN) 100 mg Oral Capsule, Take 1  Capsule (100 mg total) by mouth Twice daily, Disp: 180 Capsule, Rfl: 0    lipase-protease-amylase 5,000-17,000- 24,000 unit Oral Capsule, Delayed Release(E.C.), Take 1 Capsule by mouth Three times daily with meals for 360 days, Disp: 270 Capsule, Rfl: 3    losartan (COZAAR) 50 mg Oral Tablet, Take 1 Tablet (50 mg total) by mouth Twice daily, Disp: 180 Tablet, Rfl: 1    LYSINE ORAL, Take 100 mg by mouth Once per day as needed, Disp: , Rfl:     megestroL (MEGACE) 400 mg/10 mL (10 mL) Oral Suspension, Take 20 mL (800 mg total) by mouth Once a day, Disp:  600 mL, Rfl: 0    melatonin 10 mg Oral Tablet, Take 1 Tablet (10 mg total) by mouth Every night, Disp: , Rfl:     methotrexate 2.5 mg Oral tablet, Take 4 Tablets (10 mg total) by mouth Every 7 days for 90 days, Disp: 48 Tablet, Rfl: 0    metoprolol succinate (TOPROL-XL) 100 mg Oral Tablet Sustained Release 24 hr, Take 1 Tablet (100 mg total) by mouth Once a day, Disp: 90 Tablet, Rfl: 0    mirabegron (MYRBETRIQ) 50 mg Oral Tablet Sustained Release 24 hr, TAKE ONE CAPSULE IN THE MORNING, Disp: 90 Each, Rfl: 3    mirtazapine (REMERON) 7.5 mg Oral Tablet, Take 1 Tablet (7.5 mg total) by mouth Every night, Disp: 30 Tablet, Rfl: 0    nitroGLYCERIN (NITROSTAT) 0.4 mg Sublingual Tablet, Sublingual, Place 1 Tablet (0.4 mg total) under the tongue Every 5 minutes as needed for Chest pain (after three doses, call ER if pain still persists) for 3 doses over 15 minutes, Disp: 25 Tablet, Rfl: 5    sodium bicarbonate 650 mg Oral Tablet, TAKE 1 TABLET BY MOUTH TWICE DAILY, Disp: 180 Tablet, Rfl: 3     Call Notes:   Theresa Greene calls in with her daughters assistance today from [facility] where she was discharged to after hospitalization. She was admitted to [facility] from 02/07/2023 through 02/18/2023. Her daughter provides the history today.     Theresa Greene initially came in to the Emergency Department at [facility] on 02/06/2023 for generalized weakness. She had previously been seen in the office on 01/24/2023 for left ankle/foot pain without a known source. Prior to going into the ED, she had gotten progressively more weak over the course of about 2 weeks and had a fall in the bathroom. She denied any injuries from the fall. She admitted to decreased oral intake over this same time period. Her daughter states that she feels this was secondary to not wanting to have to go to the restroom because she was so weak and it was such a taxing task. At [facility], she was noted to have laboratories significant for  an AKI with creatinine of 2.36 up from her baseline of 1.1-1.2 as well as mildly elevated troponin of 31.2 and 32.3, likely secondary to the AKI. CXR was negative at initial presentation.     There was no bed availability at [facility], so she was transferred to [facility] for further treatment. Her renal function improved with IV fluids. She was noted to have hypercalcemia and workup did ensue. Monoclonal gammopathy workup was negative for M-protein and the kappa/lamda ratio was normal. Imaging showed no signs of malignancy. She was treated with 3 doses of calcitonin and 1 dose of zoledronic acid. Her calcitriol was discontinued. Laboratories to include CMP, Magnesium, and Phosphorus have been ordered for 1 week post-discharge.  She was noted to have intermittent confusion and progressive weakness during the hospitalization, thus neurology was consulted. They felt this was secondary to poor appetite and chronic debilitation. They recommended the addition of mirtazapine to increase appetite and improve sleep. A repeat CXR was completed and showed bilateral multifocal pneumonia. She was treated with 5 days of Rocephin and doxycycline for this. No outpatient antibiotics were continued at the time of discharge.     She was seen was evaluated by palliative care and a POST form was completed expressing her wishes to have no aggressive care and to be DNR/DNI. Their note expressed that they recommended palliative care, but during my visit her daughter did not express that this was their intention. She is currently at [facility] to complete rehabilitation to determine where she will discharge to.       Review of Systems:   All systems have been reviewed and found to be negative except for as stated above in the history of present illness.     Physical Examination:   Vitals: There were no vitals taken for this visit.    General:  No apparent distress.  Family at bedside.  Interactive.  Chronically ill appearing.   HEENT:   EOMI. Sclera and conjunctiva are clear. Nares patent without lesion or discharge. Mucous membranes moist. Bruising most pronounced under left eye.   Psychiatric:  Mood and affect appropriate. Memory intact with good short- and long-term recall. Judgement and insight appear appropriate. No suicidal or homicidal ideation.    Assessment:   (Z09) Hospital discharge follow-up  (primary encounter diagnosis)    (N17.9) AKI (acute kidney injury) (CMS HCC)    (E83.52) Hypercalcemia    (R79.89) Elevated troponin    (R62.7) FTT (failure to thrive) in adult     Plan:   Discharge medications have been reconciled. Complete rehabilitation to determine if she will be able to return home or if she will need long-term skilled nursing care. I ultimately feel that palliative care or even hospice would be appropriate for her at this time, but she has been very reluctant to proceed with either of these. Reevaluate at her next visit if not addressed prior to that.   Secondary to dehydration. Improved with IVF. Creatinine was back down to baseline at discharge. She typically runs 1.8-1.9. Avoid all nephrotoxic agents. Repeat CMP next week.   Secondary to dehydration and continued medications. Calcitriol currently on hold. Recheck CMP, Magnesium, and Phosphorus next week.   Secondary to decreased clearance from AKI. Peaked at 42.4 and was noted to be trending down. Asymptomatic.   Secondary to poor oral intake. Started on Megace and Remeron to stimulate appetite and sleep. Monitor for side effects or laboratory abnormalities. High protein diet recommended.      Patient was counseled on diagnosis, follow-up, and side effect of medications.  Reasons for follow up in clinic or emergency department were discussed.    Orders:  No orders of the defined types were placed in this encounter.     Referrals:   Orders Placed This Encounter   No orders of the following type(s) were placed in this encounter: Outpatient Referral.      Discharge Medication  List:       Current Discharge Medication List            Accurate as of February 20, 2023 11:59 PM. If you have any questions, ask your nurse or doctor.  CONTINUE these medications - NO CHANGES were made during your visit.        Details   acetaminophen 650 mg Tablet Sustained Release  Commonly known as: TYLENOL ARTHRITIS PAIN   1,300 mg, Oral, 2 TIMES DAILY PRN  Refills: 0     amiodarone 200 mg Tablet  Commonly known as: PACERONE   200 mg, Oral, DAILY  Qty: 90 Tablet  Refills: 3     amLODIPine 5 mg Tablet  Commonly known as: NORVASC   5 mg, Oral, DAILY  Refills: 0     aspirin 81 mg Tablet, Chewable   81 mg, Oral, DAILY  Refills: 0     atorvastatin 20 mg Tablet  Commonly known as: LIPITOR   20 mg, Oral, DAILY  Qty: 90 Tablet  Refills: 0     Dexlansoprazole 60 mg Cap, Delayed Rel., Multiphasic  Commonly known as: DEXILANT   60 mg, Oral, DAILY  Qty: 90 Capsule  Refills: 1     ergocalciferol (vitamin D2) 1,250 mcg (50,000 unit) Capsule  Commonly known as: DRISDOL   50,000 Units, Oral, EVERY MO AND FR, Takes on Fridays  Qty: 96 Capsule  Refills: 0     folic acid 1 mg Tablet  Commonly known as: FOLVITE   1 mg, Oral, DAILY  Qty: 90 Tablet  Refills: 0     gabapentin 100 mg Capsule  Commonly known as: NEURONTIN   100 mg, Oral, 2 TIMES DAILY  Qty: 180 Capsule  Refills: 0     lipase-protease-amylase 5,000-17,000- 24,000 unit Capsule, Delayed Release(E.C.)   1 Capsule, Oral, 3 TIMES DAILY WITH MEALS  Qty: 270 Capsule  Refills: 3     losartan 50 mg Tablet  Commonly known as: COZAAR   50 mg, Oral, 2 TIMES DAILY  Qty: 180 Tablet  Refills: 1     LYSINE ORAL   100 mg, Oral, DAILY PRN  Refills: 0     megestroL 400 mg/10 mL (10 mL) Suspension  Commonly known as: MEGACE   800 mg, Oral, DAILY  Qty: 600 mL  Refills: 0     melatonin 10 mg Tablet   10 mg, Oral, NIGHTLY  Refills: 0     methotrexate 2.5 mg tablet   10 mg, Oral, EVERY 7 DAYS  Qty: 48 Tablet  Refills: 0     metoprolol succinate 100 mg Tablet Sustained Release 24  hr  Commonly known as: TOPROL-XL   100 mg, Oral, DAILY  Qty: 90 Tablet  Refills: 0     Mirtazapine 7.5 mg Tablet  Commonly known as: REMERON   7.5 mg, Oral, NIGHTLY  Qty: 30 Tablet  Refills: 0     Myrbetriq 50 mg Tablet Sustained Release 24 hr  Generic drug: mirabegron   TAKE ONE CAPSULE IN THE MORNING  Qty: 90 Each  Refills: 3     nitroGLYCERIN 0.4 mg Tablet, Sublingual  Commonly known as: NITROSTAT   0.4 mg, Sublingual, EVERY 5 MIN PRN, for 3 doses over 15 minutes  Qty: 25 Tablet  Refills: 5     sodium bicarbonate 650 mg Tablet   650 mg, Oral, 2 TIMES DAILY  Qty: 180 Tablet  Refills: 3               Follow up: Return in about 2 months (around 04/26/2023) for HTN, CKD, and AFib with laboratories as previously scheduled. Patient was advised to follow up sooner if issues arise.     Time Spent  Total provider time spent with the patient on the call: 18 minutes.      Theresa Cherney Hill-Reinert, DO    This note was partially created using M*Modal fluency direct system (voice recognition software ) and is inherently subject to errors including those of syntax and "sound- alike" substitutions which may escape proofreading.  In such instances, original meaning may be extrapolated by contextual derivation.

## 2023-02-21 ENCOUNTER — Inpatient Hospital Stay
Admission: RE | Admit: 2023-02-21 | Discharge: 2023-02-21 | Disposition: A | Discharge status: Home / Self Care | Payer: Medicare Other | Source: Ambulatory Visit | Attending: Cardiovascular Disease | Admitting: Cardiovascular Disease

## 2023-02-21 DIAGNOSIS — I480 Paroxysmal atrial fibrillation: Secondary | ICD-10-CM | POA: Insufficient documentation

## 2023-02-21 DIAGNOSIS — I2510 Atherosclerotic heart disease of native coronary artery without angina pectoris: Secondary | ICD-10-CM | POA: Insufficient documentation

## 2023-02-21 DIAGNOSIS — Z8673 Personal history of transient ischemic attack (TIA), and cerebral infarction without residual deficits: Secondary | ICD-10-CM | POA: Insufficient documentation

## 2023-02-21 DIAGNOSIS — Z950 Presence of cardiac pacemaker: Secondary | ICD-10-CM | POA: Insufficient documentation

## 2023-02-21 DIAGNOSIS — Z9861 Coronary angioplasty status: Secondary | ICD-10-CM | POA: Insufficient documentation

## 2023-02-21 LAB — PARATHYROID HORMONE-RELATED PEPTIDE (PTHRP), PLASMA: PTH-RP: 9 pg/mL — ABNORMAL LOW (ref 11–20)

## 2023-02-22 DIAGNOSIS — I480 Paroxysmal atrial fibrillation: Secondary | ICD-10-CM

## 2023-02-24 ENCOUNTER — Encounter (INDEPENDENT_AMBULATORY_CARE_PROVIDER_SITE_OTHER): Payer: Self-pay | Admitting: Internal Medicine

## 2023-02-27 ENCOUNTER — Telehealth (HOSPITAL_BASED_OUTPATIENT_CLINIC_OR_DEPARTMENT_OTHER): Payer: Self-pay

## 2023-02-27 NOTE — Telephone Encounter (Addendum)
Reviewed with Dr. Marlene Bast and he advises no changes to current treatment plan at this time. Abbie Sons, RN      ----- Message from Peter Minium, RN sent at 02/22/2023  9:37 AM EDT -----  Regarding: PPM Remote  Mardene Speak,    Ms Gitto had a routine remote. Her RV thresholds have increased considerably starting around June-ish. Previously they were normal running 0.75V -1.0V. They're now measuring >3.0V. The rest of the RV lead testing is fine. She doesn't V-pace much, usually 5ish% or less.  The device has Auto-Capture on and will adjust to the output needed for capture. And since she currently doesn't V-pace much, her battery hasn't taken a hit from it at this point.    I looked through her chart and it looks like recently she's been in the hospital for weakness, severe malnutrition and other issues of the like. I don't know if a decline in her overall condition plays a part. Some meds and electrolytes can affect it also. But it was a large increase, so I didn't want to not pass it along just incase.       Thanks,  Sam

## 2023-03-04 ENCOUNTER — Other Ambulatory Visit: Payer: Self-pay

## 2023-03-14 DEATH — deceased

## 2023-04-09 ENCOUNTER — Ambulatory Visit (INDEPENDENT_AMBULATORY_CARE_PROVIDER_SITE_OTHER): Payer: Self-pay | Admitting: Orthopaedic Surgery

## 2023-04-25 ENCOUNTER — Encounter (INDEPENDENT_AMBULATORY_CARE_PROVIDER_SITE_OTHER): Payer: Self-pay | Admitting: Internal Medicine

## 2023-04-26 ENCOUNTER — Ambulatory Visit (INDEPENDENT_AMBULATORY_CARE_PROVIDER_SITE_OTHER): Payer: Self-pay | Admitting: Internal Medicine

## 2023-05-29 ENCOUNTER — Ambulatory Visit (HOSPITAL_BASED_OUTPATIENT_CLINIC_OR_DEPARTMENT_OTHER): Payer: Self-pay

## 2023-05-29 ENCOUNTER — Encounter (HOSPITAL_BASED_OUTPATIENT_CLINIC_OR_DEPARTMENT_OTHER): Payer: Self-pay | Admitting: PHYSICIAN ASSISTANT

## 2023-11-06 ENCOUNTER — Ambulatory Visit (HOSPITAL_BASED_OUTPATIENT_CLINIC_OR_DEPARTMENT_OTHER): Payer: Self-pay | Admitting: PHYSICIAN ASSISTANT

## 2024-09-08 LAB — ECG 12 LEAD
Atrial Rate: 70 {beats}/min
Calculated R Axis: 79 degrees
Calculated T Axis: 91 degrees
PR Interval: 242 ms
QRS Duration: 80 ms
QT Interval: 460 ms
QTC Calculation: 496 ms
Ventricular rate: 70 {beats}/min
